# Patient Record
Sex: Male | Born: 1935 | Race: White | Hispanic: No | Marital: Married | Smoking: Former smoker
Health system: Southern US, Community
[De-identification: ages and names within clinical notes are randomized; demographics above are authoritative.]

## PROBLEM LIST (undated history)

## (undated) DIAGNOSIS — I2510 Atherosclerotic heart disease of native coronary artery without angina pectoris: Secondary | ICD-10-CM

## (undated) DIAGNOSIS — I219 Acute myocardial infarction, unspecified: Secondary | ICD-10-CM

## (undated) DIAGNOSIS — M1990 Unspecified osteoarthritis, unspecified site: Secondary | ICD-10-CM

## (undated) DIAGNOSIS — J986 Disorders of diaphragm: Secondary | ICD-10-CM

## (undated) DIAGNOSIS — I495 Sick sinus syndrome: Secondary | ICD-10-CM

## (undated) DIAGNOSIS — M4800 Spinal stenosis, site unspecified: Secondary | ICD-10-CM

## (undated) DIAGNOSIS — I519 Heart disease, unspecified: Secondary | ICD-10-CM

## (undated) DIAGNOSIS — M722 Plantar fascial fibromatosis: Secondary | ICD-10-CM

## (undated) DIAGNOSIS — J329 Chronic sinusitis, unspecified: Secondary | ICD-10-CM

## (undated) DIAGNOSIS — J479 Bronchiectasis, uncomplicated: Secondary | ICD-10-CM

## (undated) DIAGNOSIS — K219 Gastro-esophageal reflux disease without esophagitis: Secondary | ICD-10-CM

## (undated) DIAGNOSIS — Z8719 Personal history of other diseases of the digestive system: Secondary | ICD-10-CM

## (undated) DIAGNOSIS — R040 Epistaxis: Secondary | ICD-10-CM

## (undated) DIAGNOSIS — I10 Essential (primary) hypertension: Secondary | ICD-10-CM

## (undated) DIAGNOSIS — M549 Dorsalgia, unspecified: Secondary | ICD-10-CM

## (undated) DIAGNOSIS — G4700 Insomnia, unspecified: Secondary | ICD-10-CM

## (undated) DIAGNOSIS — J309 Allergic rhinitis, unspecified: Secondary | ICD-10-CM

## (undated) DIAGNOSIS — K5790 Diverticulosis of intestine, part unspecified, without perforation or abscess without bleeding: Secondary | ICD-10-CM

## (undated) DIAGNOSIS — I4891 Unspecified atrial fibrillation: Secondary | ICD-10-CM

## (undated) DIAGNOSIS — M810 Age-related osteoporosis without current pathological fracture: Secondary | ICD-10-CM

## (undated) DIAGNOSIS — E785 Hyperlipidemia, unspecified: Secondary | ICD-10-CM

## (undated) DIAGNOSIS — F0390 Unspecified dementia without behavioral disturbance: Secondary | ICD-10-CM

## (undated) HISTORY — DX: Personal history of other diseases of the digestive system: Z87.19

## (undated) HISTORY — DX: Spinal stenosis, site unspecified: M48.00

## (undated) HISTORY — PX: CATARACT EXTRACTION W/ INTRAOCULAR LENS  IMPLANT, BILATERAL: SHX1307

## (undated) HISTORY — DX: Allergic rhinitis, unspecified: J30.9

## (undated) HISTORY — DX: Epistaxis: R04.0

## (undated) HISTORY — DX: Hyperlipidemia, unspecified: E78.5

## (undated) HISTORY — DX: Diverticulosis of intestine, part unspecified, without perforation or abscess without bleeding: K57.90

## (undated) HISTORY — PX: PACEMAKER INSERTION: SHX728

## (undated) HISTORY — DX: Heart disease, unspecified: I51.9

## (undated) HISTORY — DX: Plantar fascial fibromatosis: M72.2

## (undated) HISTORY — PX: KIDNEY CYST REMOVAL: SHX684

## (undated) HISTORY — PX: VERTEBROPLASTY: SHX113

## (undated) HISTORY — DX: Disorders of diaphragm: J98.6

## (undated) HISTORY — DX: Bronchiectasis, uncomplicated: J47.9

## (undated) HISTORY — DX: Chronic sinusitis, unspecified: J32.9

## (undated) HISTORY — PX: COLONOSCOPY: SHX174

## (undated) HISTORY — PX: CORONARY ARTERY BYPASS GRAFT: SHX141

## (undated) HISTORY — PX: CHOLECYSTECTOMY: SHX55

---

## 1998-03-22 ENCOUNTER — Encounter (HOSPITAL_COMMUNITY): Admission: RE | Admit: 1998-03-22 | Discharge: 1998-06-20 | Payer: Self-pay | Admitting: Cardiology

## 1998-06-20 ENCOUNTER — Encounter (HOSPITAL_COMMUNITY): Admission: RE | Admit: 1998-06-20 | Discharge: 1998-09-18 | Payer: Self-pay | Admitting: Cardiology

## 1998-09-19 ENCOUNTER — Encounter (HOSPITAL_COMMUNITY): Admission: RE | Admit: 1998-09-19 | Discharge: 1998-12-18 | Payer: Self-pay | Admitting: Cardiology

## 1998-12-19 ENCOUNTER — Encounter (HOSPITAL_COMMUNITY): Admission: RE | Admit: 1998-12-19 | Discharge: 1999-03-19 | Payer: Self-pay | Admitting: Cardiology

## 1999-03-20 ENCOUNTER — Encounter (HOSPITAL_COMMUNITY): Admission: RE | Admit: 1999-03-20 | Discharge: 1999-06-18 | Payer: Self-pay | Admitting: Cardiology

## 1999-06-19 ENCOUNTER — Encounter (HOSPITAL_COMMUNITY): Admission: RE | Admit: 1999-06-19 | Discharge: 1999-07-20 | Payer: Self-pay | Admitting: Cardiology

## 1999-07-01 ENCOUNTER — Ambulatory Visit (HOSPITAL_BASED_OUTPATIENT_CLINIC_OR_DEPARTMENT_OTHER): Admission: RE | Admit: 1999-07-01 | Discharge: 1999-07-01 | Payer: Self-pay | Admitting: Urology

## 1999-07-21 ENCOUNTER — Encounter: Admission: RE | Admit: 1999-07-21 | Discharge: 1999-10-19 | Payer: Self-pay | Admitting: Cardiology

## 2002-11-30 ENCOUNTER — Encounter: Payer: Self-pay | Admitting: Cardiology

## 2002-11-30 ENCOUNTER — Inpatient Hospital Stay (HOSPITAL_COMMUNITY): Admission: EM | Admit: 2002-11-30 | Discharge: 2002-12-03 | Payer: Self-pay | Admitting: Emergency Medicine

## 2002-12-24 ENCOUNTER — Encounter (HOSPITAL_COMMUNITY): Admission: RE | Admit: 2002-12-24 | Discharge: 2003-03-24 | Payer: Self-pay | Admitting: Cardiology

## 2003-03-26 ENCOUNTER — Encounter (HOSPITAL_COMMUNITY): Admission: RE | Admit: 2003-03-26 | Discharge: 2003-06-24 | Payer: Self-pay | Admitting: Cardiology

## 2003-07-21 ENCOUNTER — Encounter (HOSPITAL_COMMUNITY): Admission: RE | Admit: 2003-07-21 | Discharge: 2003-10-19 | Payer: Self-pay | Admitting: Cardiology

## 2003-10-21 ENCOUNTER — Encounter (HOSPITAL_COMMUNITY): Admission: RE | Admit: 2003-10-21 | Discharge: 2004-01-19 | Payer: Self-pay | Admitting: Cardiology

## 2004-01-21 ENCOUNTER — Encounter (HOSPITAL_COMMUNITY): Admission: RE | Admit: 2004-01-21 | Discharge: 2004-04-20 | Payer: Self-pay | Admitting: Cardiology

## 2004-04-21 ENCOUNTER — Encounter (HOSPITAL_COMMUNITY): Admission: RE | Admit: 2004-04-21 | Discharge: 2004-07-20 | Payer: Self-pay | Admitting: Cardiology

## 2004-07-21 ENCOUNTER — Encounter (HOSPITAL_COMMUNITY): Admission: RE | Admit: 2004-07-21 | Discharge: 2004-10-19 | Payer: Self-pay | Admitting: Cardiology

## 2004-08-19 ENCOUNTER — Ambulatory Visit (HOSPITAL_COMMUNITY): Admission: RE | Admit: 2004-08-19 | Discharge: 2004-08-19 | Payer: Self-pay | Admitting: Ophthalmology

## 2004-08-19 ENCOUNTER — Ambulatory Visit (HOSPITAL_BASED_OUTPATIENT_CLINIC_OR_DEPARTMENT_OTHER): Admission: RE | Admit: 2004-08-19 | Discharge: 2004-08-19 | Payer: Self-pay | Admitting: Ophthalmology

## 2004-08-19 ENCOUNTER — Encounter (INDEPENDENT_AMBULATORY_CARE_PROVIDER_SITE_OTHER): Payer: Self-pay | Admitting: *Deleted

## 2004-10-20 ENCOUNTER — Encounter (HOSPITAL_COMMUNITY): Admission: RE | Admit: 2004-10-20 | Discharge: 2005-01-18 | Payer: Self-pay | Admitting: Cardiology

## 2005-01-20 ENCOUNTER — Encounter (HOSPITAL_COMMUNITY): Admission: RE | Admit: 2005-01-20 | Discharge: 2005-04-20 | Payer: Self-pay | Admitting: Cardiology

## 2005-01-29 ENCOUNTER — Ambulatory Visit: Payer: Self-pay | Admitting: Cardiology

## 2005-04-21 ENCOUNTER — Encounter (HOSPITAL_COMMUNITY): Admission: RE | Admit: 2005-04-21 | Discharge: 2005-07-20 | Payer: Self-pay | Admitting: Cardiology

## 2005-06-08 ENCOUNTER — Encounter: Admission: RE | Admit: 2005-06-08 | Discharge: 2005-06-08 | Payer: Self-pay | Admitting: Endocrinology

## 2005-07-21 ENCOUNTER — Encounter (HOSPITAL_COMMUNITY): Admission: RE | Admit: 2005-07-21 | Discharge: 2005-10-19 | Payer: Self-pay | Admitting: Cardiology

## 2005-08-06 ENCOUNTER — Ambulatory Visit: Payer: Self-pay

## 2005-08-26 ENCOUNTER — Ambulatory Visit: Payer: Self-pay | Admitting: Cardiology

## 2005-08-30 ENCOUNTER — Ambulatory Visit: Payer: Self-pay | Admitting: Internal Medicine

## 2005-08-30 ENCOUNTER — Ambulatory Visit: Payer: Self-pay

## 2005-09-01 ENCOUNTER — Ambulatory Visit: Payer: Self-pay | Admitting: Cardiology

## 2005-09-06 ENCOUNTER — Ambulatory Visit: Payer: Self-pay | Admitting: Cardiology

## 2005-09-13 ENCOUNTER — Ambulatory Visit: Payer: Self-pay | Admitting: Internal Medicine

## 2005-09-20 ENCOUNTER — Ambulatory Visit: Payer: Self-pay | Admitting: Cardiology

## 2005-09-27 ENCOUNTER — Ambulatory Visit: Payer: Self-pay | Admitting: Cardiology

## 2005-09-28 ENCOUNTER — Ambulatory Visit: Payer: Self-pay | Admitting: Cardiology

## 2005-09-28 ENCOUNTER — Emergency Department (HOSPITAL_COMMUNITY): Admission: EM | Admit: 2005-09-28 | Discharge: 2005-09-28 | Payer: Self-pay | Admitting: *Deleted

## 2005-10-01 ENCOUNTER — Ambulatory Visit: Payer: Self-pay | Admitting: Cardiology

## 2005-10-13 ENCOUNTER — Ambulatory Visit: Payer: Self-pay | Admitting: Cardiology

## 2005-10-20 ENCOUNTER — Encounter (HOSPITAL_COMMUNITY): Admission: RE | Admit: 2005-10-20 | Discharge: 2006-01-18 | Payer: Self-pay | Admitting: Cardiology

## 2005-10-21 ENCOUNTER — Ambulatory Visit: Payer: Self-pay | Admitting: Cardiology

## 2005-10-28 ENCOUNTER — Inpatient Hospital Stay (HOSPITAL_COMMUNITY): Admission: EM | Admit: 2005-10-28 | Discharge: 2005-10-30 | Payer: Self-pay | Admitting: Emergency Medicine

## 2005-11-03 ENCOUNTER — Ambulatory Visit: Payer: Self-pay | Admitting: Cardiology

## 2005-11-08 ENCOUNTER — Ambulatory Visit: Payer: Self-pay | Admitting: Cardiology

## 2005-11-17 ENCOUNTER — Ambulatory Visit: Payer: Self-pay | Admitting: Cardiology

## 2005-11-26 ENCOUNTER — Ambulatory Visit: Payer: Self-pay | Admitting: Cardiology

## 2005-12-03 ENCOUNTER — Ambulatory Visit: Payer: Self-pay | Admitting: Cardiology

## 2005-12-03 ENCOUNTER — Ambulatory Visit: Payer: Self-pay | Admitting: *Deleted

## 2005-12-17 ENCOUNTER — Ambulatory Visit: Payer: Self-pay | Admitting: Cardiology

## 2005-12-29 ENCOUNTER — Ambulatory Visit: Payer: Self-pay | Admitting: *Deleted

## 2006-01-07 ENCOUNTER — Ambulatory Visit: Payer: Self-pay | Admitting: Cardiology

## 2006-01-20 ENCOUNTER — Encounter (HOSPITAL_COMMUNITY): Admission: RE | Admit: 2006-01-20 | Discharge: 2006-04-20 | Payer: Self-pay | Admitting: Cardiology

## 2006-01-25 ENCOUNTER — Ambulatory Visit: Payer: Self-pay | Admitting: Cardiology

## 2006-01-31 ENCOUNTER — Ambulatory Visit: Payer: Self-pay | Admitting: Internal Medicine

## 2006-02-28 ENCOUNTER — Ambulatory Visit: Payer: Self-pay | Admitting: Cardiology

## 2006-03-24 ENCOUNTER — Ambulatory Visit: Payer: Self-pay | Admitting: Internal Medicine

## 2006-03-28 ENCOUNTER — Ambulatory Visit: Payer: Self-pay | Admitting: Cardiology

## 2006-04-04 ENCOUNTER — Ambulatory Visit: Payer: Self-pay | Admitting: Internal Medicine

## 2006-04-12 ENCOUNTER — Ambulatory Visit: Payer: Self-pay | Admitting: Cardiovascular Disease

## 2006-04-21 ENCOUNTER — Encounter (HOSPITAL_COMMUNITY): Admission: RE | Admit: 2006-04-21 | Discharge: 2006-07-20 | Payer: Self-pay | Admitting: Cardiology

## 2006-04-26 ENCOUNTER — Ambulatory Visit: Payer: Self-pay | Admitting: *Deleted

## 2006-04-29 ENCOUNTER — Ambulatory Visit: Payer: Self-pay | Admitting: Internal Medicine

## 2006-05-06 ENCOUNTER — Ambulatory Visit: Payer: Self-pay | Admitting: Internal Medicine

## 2006-05-11 ENCOUNTER — Ambulatory Visit: Payer: Self-pay | Admitting: Cardiology

## 2006-05-18 ENCOUNTER — Ambulatory Visit: Payer: Self-pay | Admitting: Internal Medicine

## 2006-05-18 ENCOUNTER — Ambulatory Visit: Payer: Self-pay | Admitting: Cardiology

## 2006-05-25 ENCOUNTER — Ambulatory Visit: Payer: Self-pay | Admitting: Emergency Medicine

## 2006-06-01 ENCOUNTER — Ambulatory Visit: Payer: Self-pay | Admitting: Cardiology

## 2006-06-21 ENCOUNTER — Ambulatory Visit: Payer: Self-pay | Admitting: Cardiology

## 2006-07-05 ENCOUNTER — Ambulatory Visit: Payer: Self-pay | Admitting: Cardiology

## 2006-07-21 ENCOUNTER — Encounter (HOSPITAL_COMMUNITY): Admission: RE | Admit: 2006-07-21 | Discharge: 2006-10-19 | Payer: Self-pay | Admitting: Cardiology

## 2006-07-25 ENCOUNTER — Ambulatory Visit: Payer: Self-pay | Admitting: Internal Medicine

## 2006-07-25 ENCOUNTER — Ambulatory Visit: Payer: Self-pay | Admitting: Cardiology

## 2006-08-01 ENCOUNTER — Ambulatory Visit: Payer: Self-pay | Admitting: Cardiovascular Disease

## 2006-08-03 ENCOUNTER — Ambulatory Visit: Payer: Self-pay | Admitting: Internal Medicine

## 2006-08-10 ENCOUNTER — Ambulatory Visit: Payer: Self-pay | Admitting: Cardiology

## 2006-08-10 ENCOUNTER — Ambulatory Visit: Payer: Self-pay | Admitting: Emergency Medicine

## 2006-08-11 ENCOUNTER — Ambulatory Visit: Payer: Self-pay | Admitting: Cardiology

## 2006-08-17 ENCOUNTER — Ambulatory Visit: Payer: Self-pay

## 2006-08-19 ENCOUNTER — Ambulatory Visit: Payer: Self-pay

## 2006-08-19 ENCOUNTER — Ambulatory Visit: Payer: Self-pay | Admitting: Cardiology

## 2006-08-24 ENCOUNTER — Ambulatory Visit (HOSPITAL_COMMUNITY): Admission: RE | Admit: 2006-08-24 | Discharge: 2006-08-25 | Payer: Self-pay | Admitting: Internal Medicine

## 2006-08-24 ENCOUNTER — Ambulatory Visit: Payer: Self-pay | Admitting: Internal Medicine

## 2006-08-26 ENCOUNTER — Ambulatory Visit: Payer: Self-pay | Admitting: Cardiology

## 2006-08-26 ENCOUNTER — Inpatient Hospital Stay (HOSPITAL_COMMUNITY): Admission: EM | Admit: 2006-08-26 | Discharge: 2006-08-28 | Payer: Self-pay | Admitting: Emergency Medicine

## 2006-08-30 ENCOUNTER — Ambulatory Visit: Payer: Self-pay | Admitting: Cardiovascular Disease

## 2006-09-01 ENCOUNTER — Ambulatory Visit: Payer: Self-pay | Admitting: Internal Medicine

## 2006-09-08 ENCOUNTER — Ambulatory Visit: Payer: Self-pay | Admitting: Cardiology

## 2006-09-22 ENCOUNTER — Ambulatory Visit: Payer: Self-pay | Admitting: Cardiology

## 2006-09-26 ENCOUNTER — Ambulatory Visit: Payer: Self-pay | Admitting: Cardiovascular Disease

## 2006-10-03 ENCOUNTER — Ambulatory Visit: Payer: Self-pay | Admitting: Cardiology

## 2006-10-10 ENCOUNTER — Ambulatory Visit: Payer: Self-pay | Admitting: Emergency Medicine

## 2006-10-10 ENCOUNTER — Ambulatory Visit: Payer: Self-pay | Admitting: Cardiovascular Disease

## 2006-10-20 ENCOUNTER — Encounter (HOSPITAL_COMMUNITY): Admission: RE | Admit: 2006-10-20 | Discharge: 2007-01-18 | Payer: Self-pay | Admitting: Cardiology

## 2006-10-21 ENCOUNTER — Ambulatory Visit: Payer: Self-pay | Admitting: Cardiology

## 2006-10-25 ENCOUNTER — Ambulatory Visit: Payer: Self-pay | Admitting: *Deleted

## 2006-11-17 ENCOUNTER — Ambulatory Visit: Payer: Self-pay | Admitting: Internal Medicine

## 2006-12-01 ENCOUNTER — Ambulatory Visit: Payer: Self-pay | Admitting: Cardiology

## 2006-12-06 ENCOUNTER — Ambulatory Visit: Payer: Self-pay | Admitting: Internal Medicine

## 2006-12-06 ENCOUNTER — Ambulatory Visit: Payer: Self-pay | Admitting: Cardiology

## 2006-12-27 ENCOUNTER — Ambulatory Visit: Payer: Self-pay | Admitting: Cardiology

## 2007-01-04 ENCOUNTER — Ambulatory Visit: Payer: Self-pay | Admitting: Cardiology

## 2007-01-16 ENCOUNTER — Ambulatory Visit: Payer: Self-pay | Admitting: Internal Medicine

## 2007-01-20 ENCOUNTER — Encounter (HOSPITAL_COMMUNITY): Admission: RE | Admit: 2007-01-20 | Discharge: 2007-04-20 | Payer: Self-pay | Admitting: Cardiology

## 2007-01-20 ENCOUNTER — Ambulatory Visit: Payer: Self-pay | Admitting: Internal Medicine

## 2007-01-25 ENCOUNTER — Ambulatory Visit: Payer: Self-pay | Admitting: Cardiology

## 2007-02-03 ENCOUNTER — Ambulatory Visit: Payer: Self-pay | Admitting: Cardiology

## 2007-02-15 ENCOUNTER — Encounter: Admission: RE | Admit: 2007-02-15 | Discharge: 2007-02-15 | Payer: Self-pay | Admitting: Orthopaedic Surgery

## 2007-03-03 ENCOUNTER — Ambulatory Visit: Payer: Self-pay | Admitting: Cardiovascular Disease

## 2007-03-13 ENCOUNTER — Ambulatory Visit: Payer: Self-pay | Admitting: Cardiology

## 2007-03-27 ENCOUNTER — Ambulatory Visit: Payer: Self-pay | Admitting: Cardiovascular Disease

## 2007-04-03 ENCOUNTER — Ambulatory Visit: Payer: Self-pay | Admitting: Cardiology

## 2007-04-10 ENCOUNTER — Ambulatory Visit: Payer: Self-pay | Admitting: Cardiology

## 2007-04-21 ENCOUNTER — Encounter (HOSPITAL_COMMUNITY): Admission: RE | Admit: 2007-04-21 | Discharge: 2007-07-20 | Payer: Self-pay | Admitting: Cardiology

## 2007-04-27 ENCOUNTER — Encounter: Admission: RE | Admit: 2007-04-27 | Discharge: 2007-04-27 | Payer: Self-pay | Admitting: Orthopaedic Surgery

## 2007-04-27 ENCOUNTER — Ambulatory Visit: Payer: Self-pay | Admitting: Cardiology

## 2007-05-05 ENCOUNTER — Ambulatory Visit: Payer: Self-pay | Admitting: Cardiology

## 2007-05-17 ENCOUNTER — Ambulatory Visit: Payer: Self-pay | Admitting: Cardiology

## 2007-05-17 ENCOUNTER — Encounter: Admission: RE | Admit: 2007-05-17 | Discharge: 2007-05-17 | Payer: Self-pay | Admitting: Orthopaedic Surgery

## 2007-05-24 ENCOUNTER — Ambulatory Visit: Payer: Self-pay | Admitting: Cardiology

## 2007-06-12 ENCOUNTER — Ambulatory Visit: Payer: Self-pay | Admitting: Cardiology

## 2007-06-13 ENCOUNTER — Encounter: Admission: RE | Admit: 2007-06-13 | Discharge: 2007-06-13 | Payer: Self-pay | Admitting: Orthopaedic Surgery

## 2007-06-19 ENCOUNTER — Ambulatory Visit: Payer: Self-pay | Admitting: Cardiology

## 2007-06-28 ENCOUNTER — Ambulatory Visit: Payer: Self-pay | Admitting: Cardiology

## 2007-07-07 ENCOUNTER — Ambulatory Visit: Payer: Self-pay | Admitting: Internal Medicine

## 2007-07-13 ENCOUNTER — Ambulatory Visit: Payer: Self-pay | Admitting: Cardiology

## 2007-07-21 ENCOUNTER — Encounter (HOSPITAL_COMMUNITY): Admission: RE | Admit: 2007-07-21 | Discharge: 2007-10-19 | Payer: Self-pay | Admitting: Cardiology

## 2007-08-01 ENCOUNTER — Ambulatory Visit: Payer: Self-pay | Admitting: Cardiology

## 2007-08-17 ENCOUNTER — Ambulatory Visit: Payer: Self-pay | Admitting: Cardiology

## 2007-08-22 ENCOUNTER — Ambulatory Visit: Payer: Self-pay | Admitting: Internal Medicine

## 2007-09-06 ENCOUNTER — Ambulatory Visit: Payer: Self-pay | Admitting: Internal Medicine

## 2007-10-04 ENCOUNTER — Ambulatory Visit: Payer: Self-pay | Admitting: Cardiovascular Disease

## 2007-10-16 ENCOUNTER — Ambulatory Visit: Payer: Self-pay | Admitting: Cardiology

## 2007-10-21 ENCOUNTER — Encounter (HOSPITAL_COMMUNITY): Admission: RE | Admit: 2007-10-21 | Discharge: 2007-12-19 | Payer: Self-pay | Admitting: Cardiology

## 2007-10-26 DIAGNOSIS — J329 Chronic sinusitis, unspecified: Secondary | ICD-10-CM | POA: Insufficient documentation

## 2007-10-26 DIAGNOSIS — Z951 Presence of aortocoronary bypass graft: Secondary | ICD-10-CM

## 2007-10-26 DIAGNOSIS — M722 Plantar fascial fibromatosis: Secondary | ICD-10-CM

## 2007-10-26 DIAGNOSIS — I4891 Unspecified atrial fibrillation: Secondary | ICD-10-CM

## 2007-10-26 DIAGNOSIS — M545 Low back pain: Secondary | ICD-10-CM

## 2007-10-26 DIAGNOSIS — R040 Epistaxis: Secondary | ICD-10-CM | POA: Insufficient documentation

## 2007-10-26 DIAGNOSIS — J309 Allergic rhinitis, unspecified: Secondary | ICD-10-CM | POA: Insufficient documentation

## 2007-10-26 DIAGNOSIS — E785 Hyperlipidemia, unspecified: Secondary | ICD-10-CM

## 2007-10-26 DIAGNOSIS — J479 Bronchiectasis, uncomplicated: Secondary | ICD-10-CM

## 2007-11-01 ENCOUNTER — Ambulatory Visit: Payer: Self-pay | Admitting: Cardiology

## 2007-11-29 ENCOUNTER — Ambulatory Visit: Payer: Self-pay | Admitting: Cardiovascular Disease

## 2007-12-04 ENCOUNTER — Ambulatory Visit: Payer: Self-pay | Admitting: Internal Medicine

## 2007-12-21 ENCOUNTER — Encounter (HOSPITAL_COMMUNITY): Admission: RE | Admit: 2007-12-21 | Discharge: 2008-03-14 | Payer: Self-pay | Admitting: Cardiology

## 2007-12-27 ENCOUNTER — Ambulatory Visit: Payer: Self-pay | Admitting: Cardiovascular Disease

## 2008-01-17 ENCOUNTER — Ambulatory Visit: Payer: Self-pay | Admitting: Cardiology

## 2008-01-24 ENCOUNTER — Ambulatory Visit: Payer: Self-pay | Admitting: Internal Medicine

## 2008-02-21 ENCOUNTER — Ambulatory Visit: Payer: Self-pay | Admitting: Cardiology

## 2008-02-21 ENCOUNTER — Ambulatory Visit: Payer: Self-pay | Admitting: Internal Medicine

## 2008-03-20 ENCOUNTER — Ambulatory Visit: Payer: Self-pay | Admitting: Internal Medicine

## 2008-03-20 ENCOUNTER — Encounter (HOSPITAL_COMMUNITY): Admission: RE | Admit: 2008-03-20 | Discharge: 2008-06-18 | Payer: Self-pay | Admitting: Cardiology

## 2008-04-17 ENCOUNTER — Ambulatory Visit: Payer: Self-pay | Admitting: Cardiology

## 2008-05-20 ENCOUNTER — Ambulatory Visit: Payer: Self-pay | Admitting: Cardiology

## 2008-05-22 ENCOUNTER — Ambulatory Visit: Payer: Self-pay | Admitting: Internal Medicine

## 2008-05-28 ENCOUNTER — Ambulatory Visit: Payer: Self-pay

## 2008-06-13 ENCOUNTER — Ambulatory Visit: Payer: Self-pay | Admitting: Cardiology

## 2008-06-17 ENCOUNTER — Ambulatory Visit: Payer: Self-pay | Admitting: Cardiology

## 2008-06-20 ENCOUNTER — Encounter (HOSPITAL_COMMUNITY): Admission: RE | Admit: 2008-06-20 | Discharge: 2008-09-18 | Payer: Self-pay | Admitting: Cardiology

## 2008-07-15 ENCOUNTER — Ambulatory Visit: Payer: Self-pay | Admitting: Cardiovascular Disease

## 2008-08-12 ENCOUNTER — Ambulatory Visit: Payer: Self-pay | Admitting: Internal Medicine

## 2008-08-14 ENCOUNTER — Ambulatory Visit: Payer: Self-pay | Admitting: Cardiology

## 2008-09-10 ENCOUNTER — Ambulatory Visit: Payer: Self-pay | Admitting: Internal Medicine

## 2008-09-10 ENCOUNTER — Ambulatory Visit: Payer: Self-pay | Admitting: Cardiology

## 2008-09-19 ENCOUNTER — Encounter (HOSPITAL_COMMUNITY): Admission: RE | Admit: 2008-09-19 | Discharge: 2008-12-18 | Payer: Self-pay | Admitting: Cardiology

## 2008-10-08 ENCOUNTER — Ambulatory Visit: Payer: Self-pay | Admitting: Internal Medicine

## 2008-11-05 ENCOUNTER — Ambulatory Visit: Payer: Self-pay | Admitting: Cardiovascular Disease

## 2008-12-05 ENCOUNTER — Ambulatory Visit: Payer: Self-pay | Admitting: Cardiology

## 2008-12-17 ENCOUNTER — Ambulatory Visit: Payer: Self-pay | Admitting: Internal Medicine

## 2008-12-20 ENCOUNTER — Encounter (HOSPITAL_COMMUNITY): Admission: RE | Admit: 2008-12-20 | Discharge: 2009-03-20 | Payer: Self-pay | Admitting: Cardiology

## 2008-12-31 ENCOUNTER — Ambulatory Visit: Payer: Self-pay | Admitting: Cardiovascular Disease

## 2009-01-30 ENCOUNTER — Ambulatory Visit: Payer: Self-pay | Admitting: Cardiovascular Disease

## 2009-02-27 ENCOUNTER — Ambulatory Visit: Payer: Self-pay | Admitting: Cardiovascular Disease

## 2009-03-13 ENCOUNTER — Ambulatory Visit: Payer: Self-pay | Admitting: Cardiology

## 2009-03-17 ENCOUNTER — Encounter: Payer: Self-pay | Admitting: Cardiology

## 2009-03-19 ENCOUNTER — Ambulatory Visit: Payer: Self-pay | Admitting: Internal Medicine

## 2009-03-19 ENCOUNTER — Ambulatory Visit: Payer: Self-pay | Admitting: Cardiology

## 2009-03-21 ENCOUNTER — Encounter (HOSPITAL_COMMUNITY): Admission: RE | Admit: 2009-03-21 | Discharge: 2009-06-19 | Payer: Self-pay | Admitting: Cardiology

## 2009-03-30 ENCOUNTER — Encounter: Payer: Self-pay | Admitting: Cardiology

## 2009-03-31 ENCOUNTER — Encounter (INDEPENDENT_AMBULATORY_CARE_PROVIDER_SITE_OTHER): Payer: Self-pay

## 2009-04-01 ENCOUNTER — Ambulatory Visit: Payer: Self-pay | Admitting: Cardiology

## 2009-04-16 ENCOUNTER — Telehealth: Payer: Self-pay | Admitting: Cardiology

## 2009-04-29 ENCOUNTER — Ambulatory Visit: Payer: Self-pay | Admitting: Cardiology

## 2009-05-20 ENCOUNTER — Encounter: Payer: Self-pay | Admitting: *Deleted

## 2009-06-02 ENCOUNTER — Telehealth: Payer: Self-pay | Admitting: Cardiology

## 2009-06-03 ENCOUNTER — Encounter (INDEPENDENT_AMBULATORY_CARE_PROVIDER_SITE_OTHER): Payer: Self-pay | Admitting: Cardiology

## 2009-06-03 ENCOUNTER — Ambulatory Visit: Payer: Self-pay | Admitting: Internal Medicine

## 2009-06-03 LAB — CONVERTED CEMR LAB: Protime: 16.5

## 2009-06-08 ENCOUNTER — Encounter: Payer: Self-pay | Admitting: Cardiology

## 2009-06-09 ENCOUNTER — Ambulatory Visit: Payer: Self-pay | Admitting: Cardiology

## 2009-06-18 ENCOUNTER — Ambulatory Visit: Payer: Self-pay | Admitting: Internal Medicine

## 2009-06-19 ENCOUNTER — Ambulatory Visit: Payer: Self-pay | Admitting: Cardiology

## 2009-06-19 LAB — CONVERTED CEMR LAB: Prothrombin Time: 19.2 s

## 2009-06-20 ENCOUNTER — Encounter (HOSPITAL_COMMUNITY): Admission: RE | Admit: 2009-06-20 | Discharge: 2009-09-18 | Payer: Self-pay | Admitting: Cardiology

## 2009-06-23 ENCOUNTER — Encounter: Payer: Self-pay | Admitting: Internal Medicine

## 2009-06-25 ENCOUNTER — Encounter: Payer: Self-pay | Admitting: *Deleted

## 2009-07-08 ENCOUNTER — Ambulatory Visit: Payer: Self-pay | Admitting: Cardiology

## 2009-08-05 ENCOUNTER — Ambulatory Visit: Payer: Self-pay | Admitting: Cardiology

## 2009-08-05 LAB — CONVERTED CEMR LAB: POC INR: 2.5

## 2009-09-02 ENCOUNTER — Ambulatory Visit: Payer: Self-pay | Admitting: Cardiology

## 2009-09-02 ENCOUNTER — Ambulatory Visit: Payer: Self-pay | Admitting: Internal Medicine

## 2009-09-02 DIAGNOSIS — Z950 Presence of cardiac pacemaker: Secondary | ICD-10-CM

## 2009-09-19 ENCOUNTER — Encounter (HOSPITAL_COMMUNITY): Admission: RE | Admit: 2009-09-19 | Discharge: 2009-12-18 | Payer: Self-pay | Admitting: Cardiology

## 2009-09-24 LAB — HM COLONOSCOPY: HM Colonoscopy: NORMAL

## 2009-09-25 ENCOUNTER — Ambulatory Visit: Payer: Self-pay | Admitting: Internal Medicine

## 2009-10-07 ENCOUNTER — Ambulatory Visit: Payer: Self-pay | Admitting: Cardiology

## 2009-10-07 LAB — CONVERTED CEMR LAB: POC INR: 1.7

## 2009-10-21 ENCOUNTER — Ambulatory Visit: Payer: Self-pay | Admitting: Internal Medicine

## 2009-10-21 LAB — CONVERTED CEMR LAB: POC INR: 2.2

## 2009-10-27 ENCOUNTER — Telehealth: Payer: Self-pay | Admitting: Cardiology

## 2009-11-05 ENCOUNTER — Ambulatory Visit: Payer: Self-pay | Admitting: Cardiology

## 2009-11-20 ENCOUNTER — Ambulatory Visit: Payer: Self-pay | Admitting: Cardiovascular Disease

## 2009-11-24 ENCOUNTER — Telehealth: Payer: Self-pay | Admitting: Cardiology

## 2009-12-02 ENCOUNTER — Ambulatory Visit: Payer: Self-pay | Admitting: Cardiology

## 2009-12-03 ENCOUNTER — Ambulatory Visit: Payer: Self-pay | Admitting: Internal Medicine

## 2009-12-03 ENCOUNTER — Encounter: Payer: Self-pay | Admitting: Internal Medicine

## 2009-12-09 ENCOUNTER — Ambulatory Visit: Payer: Self-pay | Admitting: Internal Medicine

## 2009-12-15 ENCOUNTER — Encounter: Payer: Self-pay | Admitting: Internal Medicine

## 2009-12-20 ENCOUNTER — Encounter (HOSPITAL_COMMUNITY): Admission: RE | Admit: 2009-12-20 | Discharge: 2010-03-20 | Payer: Self-pay | Admitting: Cardiology

## 2009-12-20 HISTORY — PX: DOPPLER ECHOCARDIOGRAPHY: SHX263

## 2009-12-22 ENCOUNTER — Telehealth: Payer: Self-pay | Admitting: Cardiology

## 2010-01-05 ENCOUNTER — Ambulatory Visit: Payer: Self-pay | Admitting: Cardiology

## 2010-01-05 ENCOUNTER — Encounter (INDEPENDENT_AMBULATORY_CARE_PROVIDER_SITE_OTHER): Payer: Self-pay | Admitting: Cardiology

## 2010-01-05 ENCOUNTER — Telehealth: Payer: Self-pay | Admitting: Cardiology

## 2010-01-27 ENCOUNTER — Ambulatory Visit: Payer: Self-pay | Admitting: Cardiology

## 2010-01-27 LAB — CONVERTED CEMR LAB: POC INR: 2.3

## 2010-01-29 ENCOUNTER — Encounter: Payer: Self-pay | Admitting: Cardiology

## 2010-02-25 ENCOUNTER — Ambulatory Visit: Payer: Self-pay | Admitting: Cardiovascular Disease

## 2010-02-25 LAB — CONVERTED CEMR LAB: POC INR: 2.1

## 2010-03-04 ENCOUNTER — Ambulatory Visit: Payer: Self-pay | Admitting: Internal Medicine

## 2010-03-09 ENCOUNTER — Telehealth (INDEPENDENT_AMBULATORY_CARE_PROVIDER_SITE_OTHER): Payer: Self-pay | Admitting: *Deleted

## 2010-03-10 ENCOUNTER — Ambulatory Visit: Payer: Self-pay | Admitting: Cardiology

## 2010-03-17 ENCOUNTER — Encounter: Payer: Self-pay | Admitting: Internal Medicine

## 2010-03-21 ENCOUNTER — Encounter (HOSPITAL_COMMUNITY): Admission: RE | Admit: 2010-03-21 | Discharge: 2010-06-19 | Payer: Self-pay | Admitting: Cardiology

## 2010-03-24 ENCOUNTER — Ambulatory Visit: Payer: Self-pay | Admitting: Internal Medicine

## 2010-03-24 LAB — CONVERTED CEMR LAB: POC INR: 2.2

## 2010-04-07 ENCOUNTER — Encounter: Payer: Self-pay | Admitting: Cardiology

## 2010-04-08 ENCOUNTER — Encounter: Payer: Self-pay | Admitting: Cardiology

## 2010-04-13 ENCOUNTER — Encounter: Payer: Self-pay | Admitting: Cardiology

## 2010-04-21 ENCOUNTER — Ambulatory Visit: Payer: Self-pay | Admitting: Cardiology

## 2010-05-11 ENCOUNTER — Inpatient Hospital Stay (HOSPITAL_COMMUNITY): Admission: EM | Admit: 2010-05-11 | Discharge: 2010-05-23 | Payer: Self-pay | Admitting: Emergency Medicine

## 2010-05-11 ENCOUNTER — Encounter: Payer: Self-pay | Admitting: Cardiology

## 2010-05-11 ENCOUNTER — Ambulatory Visit: Payer: Self-pay | Admitting: Internal Medicine

## 2010-05-13 ENCOUNTER — Encounter (INDEPENDENT_AMBULATORY_CARE_PROVIDER_SITE_OTHER): Payer: Self-pay | Admitting: Internal Medicine

## 2010-05-14 ENCOUNTER — Encounter (INDEPENDENT_AMBULATORY_CARE_PROVIDER_SITE_OTHER): Payer: Self-pay | Admitting: Internal Medicine

## 2010-05-21 ENCOUNTER — Ambulatory Visit: Payer: Self-pay | Admitting: Infectious Disease

## 2010-05-27 ENCOUNTER — Encounter: Payer: Self-pay | Admitting: Cardiology

## 2010-05-27 LAB — CONVERTED CEMR LAB
POC INR: 2.7
Prothrombin Time: 28.5 s

## 2010-05-28 ENCOUNTER — Encounter: Payer: Self-pay | Admitting: Cardiology

## 2010-05-29 ENCOUNTER — Encounter: Payer: Self-pay | Admitting: Cardiology

## 2010-06-03 ENCOUNTER — Ambulatory Visit: Payer: Self-pay | Admitting: Internal Medicine

## 2010-06-03 ENCOUNTER — Encounter: Payer: Self-pay | Admitting: Cardiology

## 2010-06-04 ENCOUNTER — Encounter: Payer: Self-pay | Admitting: Cardiology

## 2010-06-15 ENCOUNTER — Ambulatory Visit: Payer: Self-pay | Admitting: Cardiology

## 2010-06-18 ENCOUNTER — Encounter: Payer: Self-pay | Admitting: Internal Medicine

## 2010-06-18 ENCOUNTER — Encounter: Payer: Self-pay | Admitting: Cardiology

## 2010-06-19 ENCOUNTER — Encounter: Payer: Self-pay | Admitting: Cardiology

## 2010-06-19 LAB — CONVERTED CEMR LAB: POC INR: 1.81

## 2010-06-20 ENCOUNTER — Encounter (HOSPITAL_COMMUNITY): Admission: RE | Admit: 2010-06-20 | Discharge: 2010-09-18 | Payer: Self-pay | Admitting: Cardiology

## 2010-07-03 ENCOUNTER — Ambulatory Visit: Payer: Self-pay | Admitting: Cardiovascular Disease

## 2010-07-17 ENCOUNTER — Ambulatory Visit: Payer: Self-pay | Admitting: Cardiology

## 2010-08-06 ENCOUNTER — Ambulatory Visit: Payer: Self-pay | Admitting: Cardiovascular Disease

## 2010-08-06 LAB — CONVERTED CEMR LAB: POC INR: 2.2

## 2010-08-17 ENCOUNTER — Encounter: Payer: Self-pay | Admitting: Cardiology

## 2010-08-19 ENCOUNTER — Ambulatory Visit: Payer: Self-pay | Admitting: Cardiology

## 2010-08-19 DIAGNOSIS — I25810 Atherosclerosis of coronary artery bypass graft(s) without angina pectoris: Secondary | ICD-10-CM

## 2010-09-03 ENCOUNTER — Ambulatory Visit: Payer: Self-pay | Admitting: Cardiology

## 2010-09-19 ENCOUNTER — Encounter (HOSPITAL_COMMUNITY)
Admission: RE | Admit: 2010-09-19 | Discharge: 2010-12-18 | Payer: Self-pay | Source: Home / Self Care | Attending: Cardiology | Admitting: Cardiology

## 2010-09-29 ENCOUNTER — Ambulatory Visit: Payer: Self-pay | Admitting: Internal Medicine

## 2010-10-01 ENCOUNTER — Ambulatory Visit: Payer: Self-pay | Admitting: Cardiology

## 2010-10-01 LAB — CONVERTED CEMR LAB: POC INR: 2.3

## 2010-10-21 ENCOUNTER — Telehealth: Payer: Self-pay | Admitting: Cardiology

## 2010-10-27 ENCOUNTER — Ambulatory Visit: Payer: Self-pay | Admitting: Internal Medicine

## 2010-11-05 ENCOUNTER — Telehealth: Payer: Self-pay | Admitting: Cardiology

## 2010-12-01 ENCOUNTER — Ambulatory Visit: Payer: Self-pay | Admitting: Cardiology

## 2010-12-01 ENCOUNTER — Ambulatory Visit: Payer: Self-pay | Admitting: Internal Medicine

## 2010-12-01 ENCOUNTER — Encounter: Payer: Self-pay | Admitting: Cardiology

## 2010-12-09 ENCOUNTER — Telehealth: Payer: Self-pay | Admitting: Cardiology

## 2010-12-22 ENCOUNTER — Encounter (HOSPITAL_COMMUNITY)
Admission: RE | Admit: 2010-12-22 | Discharge: 2011-01-19 | Payer: Self-pay | Source: Home / Self Care | Attending: Cardiology | Admitting: Cardiology

## 2010-12-31 ENCOUNTER — Encounter: Payer: Self-pay | Admitting: Internal Medicine

## 2010-12-31 ENCOUNTER — Ambulatory Visit
Admission: RE | Admit: 2010-12-31 | Discharge: 2010-12-31 | Payer: Self-pay | Source: Home / Self Care | Attending: Internal Medicine | Admitting: Internal Medicine

## 2011-01-04 ENCOUNTER — Ambulatory Visit: Admission: RE | Admit: 2011-01-04 | Discharge: 2011-01-04 | Payer: Self-pay | Source: Home / Self Care

## 2011-01-20 ENCOUNTER — Encounter (HOSPITAL_COMMUNITY): Payer: Medicare Other

## 2011-01-21 NOTE — Assessment & Plan Note (Signed)
Summary: f39m   Visit Type:  2 months follow up Primary Provider:  (Primary Care Dr Dagoberto Ligas ) ( Dentist DRChou) (ENT DR Thayer Ohm Newman)(Eyes DR Pearlean Brownie) (Urologist DR Vonita Moss) (Dermatologist Dr Romeo Apple Turner)(Rheumatologist DR Zacarias Pontes ) ((Orthopedic DR Theron Arista Whitfield)(Allegist DR [place]man)(Pulmonary Dr Levy Pupa)  CC:  Sob- 1 episode dizziness last night.  History of Present Illness: He thinks he is doing pretty well.  He forgot his beta blocker prescription.  He gets some shortness of breath, notlnecessarily related to activity.  It varies.  He is washing out sinus with saline.  He may have gotten it too warm and got dizzy with it.  He needs to have a colon exam.    Current Medications (verified): 1)  Adult Aspirin Low Strength 81 Mg  Tbdp (Aspirin) .... Take 1 Tablet By Mouth Once A Day 2)  Metoprolol Tartrate 25 Mg  Tabs (Metoprolol Tartrate) .... Take 1 Tablet By Mouth Two Times A Day 3)  Citracal Plus  Tabs (Multiple Minerals-Vitamins) .... Take 1 Tablet Three Times Daily 4)  Warfarin Sodium 1 Mg  Tabs (Warfarin Sodium) .... Use As Directed 5)  Crestor 10 Mg  Tabs (Rosuvastatin Calcium) .... Take 1 Tablet By Mouth Once A Day 6)  Fexofenadine Hcl 180 Mg  Tabs (Fexofenadine Hcl) .... Take 1 Tablet By Mouth Once A Day 7)  Finasteride 5 Mg  Tabs (Finasteride) .... Take 1 Tablet By Mouth Once A Day 8)  Fluticasone Propionate 50 Mcg/act  Susp (Fluticasone Propionate) .... Take 2 Sprays in Each Nostril Once A Day 9)  Terazosin Hcl 5 Mg  Caps (Terazosin Hcl) .... Take 1 Capsule By Mouth Once A Day 10)  Omeprazole 40 Mg Cpdr (Omeprazole) .... Take 1 Capsule Every Day 11)  Lasix 20 Mg  Tabs (Furosemide) .... Take 1 Tablet By Mouth Once A Day 12)  Lyrica 75 Mg Caps (Pregabalin) .... Take One Capsule Daily 13)  Alendronate Sodium 70 Mg Tabs (Alendronate Sodium) .... Take 1 Tablet Evey Week 14)  Fish Oil 1000 Mg Caps (Omega-3 Fatty Acids) .... Take One Capsule Daily 15)  Coq-10 100 Mg Caps  (Coenzyme Q10) .... Take One Capsule Daily  Allergies: 1)  ! Penicillin 2)  ! Sulfa 3)  ! Flagyl 4)  ! Doxycycline Hyclate (Doxycycline Hyclate) 5)  ! Erythromycin 6)  ! Tetracycline 7)  ! * Clindamycin  Vital Signs:  Patient profile:   77 year old male Height:      70 inches Weight:      202.50 pounds BMI:     29.16 Pulse rate:   88 / minute Pulse rhythm:   regular Resp:     18 per minute BP sitting:   140 / 80  (right arm) Cuff size:   large  Vitals Entered By: Vikki Ports (March 10, 2010 9:55 AM)  Physical Exam  General:  Well developed, well nourished, in no acute distress. Head:  normocephalic and atraumatic Eyes:  PERRLA/EOM intact; conjunctiva and lids normal. Neck:  No obvious carotid bruits. Chest Wall:  no deformities or breast masses noted Lungs:  Clear bilaterally to auscultation and percussion.  Minimal Ronchii Heart:  Regular today.  Normal S1 wide split s2.  No def murmur. Abdomen:  Bowel sounds positive; abdomen soft and non-tender without masses, organomegaly, or hernias noted. No hepatosplenomegaly. Extremities:  No clubbing or cyanosis. Neurologic:  Alert and oriented x 3.   PPM Specifications Following MD:  [name], MD     PPM Vendor:  [employer]     PPM Model Number:  [id number]     PPM Serial Number:  [id number] PPM DOI:  08/24/2006     PPM Implanting MD:  [name], MD  Lead 1    Location: [facility]     DOI: 08/24/2006     Model #: [id number]     Serial #: [id number]     Status: active Lead 2    Location: RV     DOI: 08/24/2006     Model #: [id number]     Serial #: [id number]     Status: active   Indications:  Tachy-Brady Syndrome   PPM Follow Up Pacer Dependent:  No      Episodes Coumadin:  Yes  Parameters Mode:  DDDR+     Lower Rate Limit:  60     Upper Rate Limit:  130 Paced AV Delay:  150     Sensed AV Delay:  120  Impression & Recommendations:  Problem # 1:  CORONARY ARTERY BYPASS GRAFT, HX OF (ICD-V45.81)  No recurrent symptoms.  Mild SOB  at times, sporadic.  No failure on exam presently.  Orders: EKG w/ Interpretation (93000)  Problem # 2:  ATRIAL FIBRILLATION (ICD-427.31)  Last in NSR.   His updated medication list for this problem includes:    Adult Aspirin Low Strength 81 Mg Tbdp (Aspirin) .[name] Kitchen... Take 1 tablet by mouth once a day    Metoprolol Tartrate 25 Mg Tabs (Metoprolol tartrate) .[name] Kitchen... Take 1 tablet by mouth two times a day    Warfarin Sodium 1 Mg Tabs (Warfarin sodium) ..... Use as directed  His updated medication list for this problem includes:    Adult Aspirin Low Strength 81 Mg Tbdp (Aspirin) .[name] Kitchen... Take 1 tablet by mouth once a day    Metoprolol Tartrate 25 Mg Tabs (Metoprolol tartrate) .[name] Kitchen... Take 1 tablet by mouth two times a day    Warfarin Sodium 1 Mg Tabs (Warfarin sodium) ..... Use as directed    His updated medication list for this problem includes:    Adult Aspirin Low Strength 81 Mg Tbdp (Aspirin) .[name] Kitchen... Take 1 tablet by mouth once a day    Metoprolol Tartrate 25 Mg Tabs (Metoprolol tartrate) .[name] Kitchen... Take 1 tablet by mouth two times a day    Warfarin Sodium 1 Mg Tabs (Warfarin sodium) ..... Use as directed  Orders: EKG w/ Interpretation (93000)  Problem # 3:  HYPERLIPIDEMIA (ICD-272.4)  Followed by Dr. Dagoberto Ligas. His updated medication list for this problem includes:    Crestor 10 Mg Tabs (Rosuvastatin calcium) .[name] Kitchen... Take 1 tablet by mouth once a day  His updated medication list for this problem includes:    Crestor 10 Mg Tabs (Rosuvastatin calcium) .[name] Kitchen... Take 1 tablet by mouth once a day  His updated medication list for this problem includes:    Crestor 10 Mg Tabs (Rosuvastatin calcium) .[name] Kitchen... Take 1 tablet by mouth once a day  Problem # 4:  BRONCHIECTASIS (ICD-494.0) Followed by Dr. Delton Coombes  Patient Instructions: 1)  Your physician recommends that you schedule a follow-up appointment in: 3 MONTHS 2)  Your physician recommends that you continue on your current medications as directed. Please  refer to the Current Medication list given to you today. Prescriptions: METOPROLOL TARTRATE 25 MG  TABS (METOPROLOL TARTRATE) Take 1 tablet by mouth two times a day  #180 x 3   Entered by:   Julieta Gutting, RN, BSN   Authorized by:   Ronaldo Miyamoto, MD, [facility]  Signed by:   Julieta Gutting, RN, BSN on 03/10/2010   Method used:   Faxed to ...       [facility] (mail-order)             , [place]         Ph:        Fax: [phone]   RxID:   [id number]

## 2011-01-21 NOTE — Progress Notes (Signed)
Summary: INR results  Phone Note Call from Patient Call back at [facility] Phone [phone]   Caller: Patient Summary of Call: PT WANT INR RESULTS SENT TO [phone] DR.GALLEHAN OFFICE ATT SUSIE Initial call taken by: Judie Grieve,  January 05, 2010 12:22 PM  Follow-up for Phone Call        pt notified at 1700 that this was completed prior to him leaving office.[place]ford Poli Follow-up by: Cloyde Reams RN,  January 05, 2010 5:05 PM

## 2011-01-21 NOTE — Letter (Signed)
Summary: Remote Device Check  [facility], Main Office  [address]   [place], [place] [address]   Phone: [phone]  Fax: [phone]     June 18, 2010 MRN: [id number]   [facility] [address] [place], [place]  [address]   Dear Mr. HAYTER,   Your remote transmission was recieved and reviewed by your physician.  All diagnostics were within normal limits for you.   __X____Your next office visit is scheduled for:  September with Dr Ladona Ridgel.                      Please call our office to schedule an appointment.    Sincerely,  Vella Kohler

## 2011-01-21 NOTE — Cardiovascular Report (Signed)
Summary: Office Visit Remote   Office Visit Remote   Imported By: Roderic Ovens 03/19/2010 11:49:58  _____________________________________________________________________  External Attachment:    Type:   Image     Comment:   External Document

## 2011-01-21 NOTE — Medication Information (Signed)
Summary: Physician's Order  Physician's Order   Imported By: Roderic Ovens 06/08/2010 15:03:07  _____________________________________________________________________  External Attachment:    Type:   Image     Comment:   External Document

## 2011-01-21 NOTE — Progress Notes (Signed)
Summary: refills needed/will need new rx called in for new year  Phone Note Refill Request Call back at Home Phone [phone] Message from:  Patient  Refills Requested: Medication #1:  METOPROLOL TARTRATE 25 MG  TABS Take 1 tablet by mouth two times a day   Supply Requested: 3 months  Medication #2:  WARFARIN SODIUM 1 MG  TABS use as directed   Supply Requested: 3 months New rx needed [facility]  [phone]   Method Requested: Electronic Initial call taken by: Burnard Leigh,  December 22, 2009 4:33 PM Caller: Patient Reason for Call: Talk to Nurse  Follow-up for Phone Call        Rx faxed to pharmacy Follow-up by: Vikki Ports,  December 23, 2009 1:05 PM    Prescriptions: METOPROLOL TARTRATE 25 MG  TABS (METOPROLOL TARTRATE) Take 1 tablet by mouth two times a day  #180 x 3   Entered by:   Vikki Ports   Authorized by:   Ronaldo Miyamoto, MD, [facility]   Signed by:   Vikki Ports on 12/23/2009   Method used:   Faxed to ...       [facility] (mail-order)             , [place]         Ph:        Fax: [phone]   RxID:   [id number]

## 2011-01-21 NOTE — Miscellaneous (Signed)
Summary: [facility] Orders   [facility] Orders   Imported By: Roderic Ovens 08/31/2010 13:39:29  _____________________________________________________________________  External Attachment:    Type:   Image     Comment:   External Document

## 2011-01-21 NOTE — Medication Information (Signed)
Summary: [facility]  Anticoagulant Therapy  Managed by: Bethena Midget, RN, BSN Referring MD: Shawnie Pons MD PCP: (Primary Care Dr Dagoberto Ligas ) ( Dentist DRChou) (ENT DR Thayer Ohm Newman)(Eyes DR Pearlean Brownie) (Urologist DR Vonita Moss) (Dermatologist Dr Romeo Apple Turner)(Rheumatologist DR Zacarias Pontes ) ((Orthopedic DR Theron Arista Whitfield)(Allegist DR [place])(Pulmonary Dr Levy Pupa) Supervising MD: Myrtis Ser MD, Tinnie Gens Indication 1: Atrial Fibrillation Lab Used: [facility] [place] Site: [address] PT 27.1 INR POC 2.54 INR RANGE 2 - 3  Dietary changes: no    Health status changes: no    Bleeding/hemorrhagic complications: no    Recent/future hospitalizations: no    Any changes in medication regimen? yes       Details: Finished Avelox on Monday.  Recent/future dental: no  Any missed doses?: no       Is patient compliant with meds? yes      Comments: Last week pt took 1mg s on Tues and Thurs.  Allergies: 1)  ! Penicillin 2)  ! Sulfa 3)  ! Flagyl 4)  ! Doxycycline Hyclate (Doxycycline Hyclate) 5)  ! Erythromycin 6)  ! Tetracycline 7)  ! * Clindamycin  Anticoagulation Management History:      His anticoagulation is being managed by telephone today.  Positive risk factors for bleeding include an age of 63 years or older.  The bleeding index is 'intermediate risk'.  Positive CHADS2 values include Age > 37 years old.  The start date was 08/26/2005.  Prothrombin time is 27.1.  Anticoagulation responsible provider: Myrtis Ser MD, Tinnie Gens.  INR POC: 2.54.    Anticoagulation Management Assessment/Plan:      The patient's current anticoagulation dose is Warfarin sodium 1 mg  tabs: use as directed.  The target INR is 2.0-3.0.  The next INR is due 06/19/2010.  Anticoagulation instructions were given to patient.  Results were reviewed/authorized by Bethena Midget, RN, BSN.  He was notified by Bethena Midget, RN, BSN.         Prior Anticoagulation Instructions: INR 2.7  Spoke with pt.  Continue  same dose of 1/2 tablet every day except 1 tablet on Tuesday, Thursday and Saturday.  Recheck in 1 week. Faxed orders to [facility]   Current Anticoagulation Instructions: INR 2.54 Continue 0.5mg s daily except 1mg s on TT&Sat. Recheck in 2 weeks.  Orders given to [facility] (Red team)

## 2011-01-21 NOTE — Cardiovascular Report (Signed)
Summary: Office Visit Remote   Office Visit Remote   Imported By: Roderic Ovens 06/19/2010 16:36:07  _____________________________________________________________________  External Attachment:    Type:   Image     Comment:   External Document

## 2011-01-21 NOTE — Medication Information (Signed)
Summary: rov/tm  Anticoagulant Therapy  Managed by: Bethena Midget, RN, BSN Referring MD: Shawnie Pons MD PCP: (Primary Care Dr Dagoberto Ligas ) ( Dentist DRChou) (ENT DR Thayer Ohm Newman)(Eyes DR Pearlean Brownie) (Urologist DR Vonita Moss) (Dermatologist Dr Romeo Apple Turner)(Rheumatologist DR Zacarias Pontes ) ((Orthopedic DR Theron Arista Whitfield)(Allegist DR [place])(Pulmonary Dr Levy Pupa) Supervising MD: Graciela Husbands MD, Viviann Spare Indication 1: Atrial Fibrillation Lab Used: [facility] Site: [facility] INR POC 2.2 INR RANGE 2 - 3  Dietary changes: no    Health status changes: no    Bleeding/hemorrhagic complications: no    Recent/future hospitalizations: no    Any changes in medication regimen? no    Recent/future dental: no  Any missed doses?: no       Is patient compliant with meds? yes       Current Medications (verified): 1)  Adult Aspirin Low Strength 81 Mg  Tbdp (Aspirin) .... Take 1 Tablet By Mouth Once A Day 2)  Metoprolol Tartrate 25 Mg  Tabs (Metoprolol Tartrate) .... Take 1 Tablet By Mouth Two Times A Day 3)  Citracal Plus  Tabs (Multiple Minerals-Vitamins) .... Take 1 Tablet Three Times Daily 4)  Warfarin Sodium 1 Mg  Tabs (Warfarin Sodium) .... Use As Directed 5)  Crestor 10 Mg  Tabs (Rosuvastatin Calcium) .... Take 1 Tablet By Mouth Once A Day 6)  Fexofenadine Hcl 180 Mg  Tabs (Fexofenadine Hcl) .... Take 1 Tablet By Mouth Once A Day 7)  Finasteride 5 Mg  Tabs (Finasteride) .... Take 1 Tablet By Mouth Once A Day 8)  Fluticasone Propionate 50 Mcg/act  Susp (Fluticasone Propionate) .... Take 2 Sprays in Each Nostril Once A Day 9)  Terazosin Hcl 5 Mg  Caps (Terazosin Hcl) .... Take 1 Capsule By Mouth Once A Day 10)  Omeprazole 40 Mg Cpdr (Omeprazole) .... Take 1 Capsule Every Day 11)  Lasix 20 Mg  Tabs (Furosemide) .... Take 1 Tablet By Mouth Once A Day 12)  Lyrica 75 Mg Caps (Pregabalin) .... Take One Capsule Daily 13)  Alendronate Sodium 70 Mg Tabs (Alendronate Sodium) .... Take 1 Tablet Evey  Week 14)  Fish Oil 1000 Mg Caps (Omega-3 Fatty Acids) .... Take One Capsule Daily 15)  Coq-10 100 Mg Caps (Coenzyme Q10) .... Take One Capsule Daily 16)  Axona  Pack (Dietary Management Product) .... Use 1/2 Pack Daily 17)  Ambien 10 Mg Tabs (Zolpidem Tartrate) .... Take 1/2 Tab Qhs  Allergies: 1)  ! Penicillin 2)  ! Sulfa 3)  ! Flagyl 4)  ! Doxycycline Hyclate (Doxycycline Hyclate) 5)  ! Erythromycin 6)  ! Tetracycline 7)  ! * Clindamycin  Anticoagulation Management History:      The patient is taking warfarin and comes in today for a routine follow up visit.  Positive risk factors for bleeding include an age of 75 years or older.  The bleeding index is 'intermediate risk'.  Positive CHADS2 values include Age > 75 years old.  The start date was 08/26/2005.  Anticoagulation responsible provider: Graciela Husbands MD, Viviann Spare.  INR POC: 2.2.  Cuvette Lot#: [id number].  Exp: 04/2011.    Anticoagulation Management Assessment/Plan:      The patient's current anticoagulation dose is Warfarin sodium 1 mg  tabs: use as directed.  The target INR is 2.0-3.0.  The next INR is due 04/21/2010.  Anticoagulation instructions were given to patient.  Results were reviewed/authorized by Bethena Midget, RN, BSN.  He was notified by Bethena Midget, RN, BSN.         Prior Anticoagulation  Instructions: INR 2.1 Continue 0.5mg s daily except 1mg s on Tuesdays, Thursdays and Saturdays. Recheck in 4 weeks.   Current Anticoagulation Instructions: INR 2.2 Continue 0.5mg s daily except 1mg s on Tuesdays, Thursdays and Saturdays. Recheck in 4 weeks.

## 2011-01-21 NOTE — Progress Notes (Signed)
Summary: refill request  Phone Note Refill Request Message from:  Patient on October 21, 2010 9:45 AM  Refills Requested: Medication #1:  WARFARIN SODIUM 1 MG  TABS use as directed  Medication #2:  METOPROLOL TARTRATE 25 MG  TABS Take 1 tablet by mouth two times a day [facility]   Method Requested: Electronic Initial call taken by: Glynda Jaeger,  October 21, 2010 9:46 AM  Follow-up for Phone Call       Follow-up by: Judithe Modest CMA,  October 21, 2010 4:16 PM    Prescriptions: METOPROLOL TARTRATE 25 MG  TABS (METOPROLOL TARTRATE) Take 1 tablet by mouth two times a day  #180 x 3   Entered by:   Judithe Modest CMA   Authorized by:   Ronaldo Miyamoto, MD, [facility]   Signed by:   Judithe Modest CMA on 10/21/2010   Method used:   Faxed to ...       [facility] (mail-order)             , [place]         Ph:        Fax: [phone]   RxID:   [id number]

## 2011-01-21 NOTE — Miscellaneous (Signed)
Summary: [facility] Progress Note  [facility]   Imported By: Roderic Ovens 04/27/2010 10:45:54  _____________________________________________________________________  External Attachment:    Type:   Image     Comment:   External Document

## 2011-01-21 NOTE — Letter (Signed)
Summary: [facility] Office Note  [facility] Office Note   Imported By: Roderic Ovens 06/09/2010 13:41:43  _____________________________________________________________________  External Attachment:    Type:   Image     Comment:   External Document

## 2011-01-21 NOTE — Medication Information (Signed)
Summary: rov/ln  Anticoagulant Therapy  Managed by: Bethena Midget, RN, BSN Referring MD: Shawnie Pons MD PCP: (Primary Care Dr Dagoberto Ligas ) ( Dentist DRChou) (ENT DR Thayer Ohm Newman)(Eyes DR Pearlean Brownie) (Urologist DR Vonita Moss) (Dermatologist Dr Romeo Apple Turner)(Rheumatologist DR Zacarias Pontes ) ((Orthopedic DR Theron Arista Whitfield)(Allegist DR [place])(Pulmonary Dr Levy Pupa) Supervising MD: Jens Som MD, Arlys John Indication 1: Atrial Fibrillation Lab Used: [facility] [place] Site: [address] INR POC 2.2 INR RANGE 2 - 3  Dietary changes: no    Health status changes: no    Bleeding/hemorrhagic complications: no    Recent/future hospitalizations: no    Any changes in medication regimen? no    Recent/future dental: no  Any missed doses?: no       Is patient compliant with meds? yes       Allergies: 1)  ! Penicillin 2)  ! Sulfa 3)  ! Flagyl 4)  ! Doxycycline Hyclate (Doxycycline Hyclate) 5)  ! Erythromycin 6)  ! Tetracycline 7)  ! * Clindamycin  Anticoagulation Management History:      The patient is taking warfarin and comes in today for a routine follow up visit.  Positive risk factors for bleeding include an age of 75 years or older.  The bleeding index is 'intermediate risk'.  Positive CHADS2 values include Age > 49 years old.  The start date was 08/26/2005.  Anticoagulation responsible provider: Jens Som MD, Arlys John.  INR POC: 2.2.  Cuvette Lot#: [id number].  Exp: 09/2011.    Anticoagulation Management Assessment/Plan:      The patient's current anticoagulation dose is Warfarin sodium 1 mg  tabs: use as directed.  The target INR is 2.0-3.0.  The next INR is due 08/06/2010.  Anticoagulation instructions were given to patient.  Results were reviewed/authorized by Bethena Midget, RN, BSN.  He was notified by Bethena Midget, RN, BSN.         Prior Anticoagulation Instructions: INR 1.8  Take 1 tab today, then change to 1/2 tab on Monday, Wednesday, and Friday and 1 tab all other  days.  Re-check INR in 2 weeks.    Current Anticoagulation Instructions: INR 2.2 Continue 1mg s daily except 0.5mg s on Mondays, Wednesdays and Fridays. Recheck in 3 weeks.

## 2011-01-21 NOTE — Assessment & Plan Note (Signed)
Summary: f69m   Primary Provider:  (Primary Care Dr Dagoberto Ligas ) ( Dentist DRChou) (ENT DR Thayer Ohm Newman)(Eyes DR Pearlean Brownie) (Urologist DR Vonita Moss) (Dermatologist Dr Romeo Apple Turner)(Rheumatologist DR Zacarias Pontes ) ((Orthopedic DR Theron Arista Whitfield)(Allegist DR Bassfield)(Pulmonary Dr Levy Pupa)  CC:  weakness.  History of Present Illness: recently discharged after presenting with gall bladder sepsis.  underwent cholystectomy, lap.  Overall did well and back on warfarin at this point in time. Denies any chest pain.   Current Medications (verified): 1)  Adult Aspirin Low Strength 81 Mg  Tbdp (Aspirin) .... Take 1 Tablet By Mouth Once A Day 2)  Metoprolol Tartrate 25 Mg  Tabs (Metoprolol Tartrate) .... Take 1 Tablet By Mouth Two Times A Day 3)  Citracal Plus  Tabs (Multiple Minerals-Vitamins) .... Take 1 Tablet Three Times Daily 4)  Warfarin Sodium 1 Mg  Tabs (Warfarin Sodium) .... Use As Directed 5)  Crestor 10 Mg  Tabs (Rosuvastatin Calcium) .... Take 1 Tablet By Mouth Once A Day 6)  Fexofenadine Hcl 180 Mg  Tabs (Fexofenadine Hcl) .... Take 1 Tablet By Mouth Once A Day 7)  Finasteride 5 Mg  Tabs (Finasteride) .... Take 1 Tablet By Mouth Once A Day 8)  Fluticasone Propionate 50 Mcg/act  Susp (Fluticasone Propionate) .... Take 2 Sprays in Each Nostril Once A Day 9)  Terazosin Hcl 5 Mg  Caps (Terazosin Hcl) .... Take 1 Capsule By Mouth Once A Day 10)  Omeprazole 40 Mg Cpdr (Omeprazole) .... Take 1 Capsule Every Day 11)  Lasix 20 Mg  Tabs (Furosemide) .... 1/2 Tab Once Daily 12)  Lyrica 50 Mg Caps (Pregabalin) .[name] Kitchen.. 1 Cap Once Daily 13)  Alendronate Sodium 70 Mg Tabs (Alendronate Sodium) .... Take 1 Tablet Evey Week 14)  Fish Oil 1000 Mg Caps (Omega-3 Fatty Acids) .... Take One Capsule Daily 15)  Coq-10 100 Mg Caps (Coenzyme Q10) .... Take One Capsule Daily 16)  Ambien 10 Mg Tabs (Zolpidem Tartrate) .... Take 1/2 Tab Qhs  Allergies: 1)  ! Penicillin 2)  ! Sulfa 3)  ! Flagyl 4)  ! Doxycycline Hyclate  (Doxycycline Hyclate) 5)  ! Erythromycin 6)  ! Tetracycline 7)  ! * Clindamycin  Vital Signs:  Patient profile:   77 year old male Height:      70 inches Weight:      178 pounds BMI:     25.63 Pulse rate:   70 / minute Pulse rhythm:   regular BP sitting:   106 / 62  (left arm) Cuff size:   regular  Vitals Entered By: Danielle Rankin, CMA (June 15, 2010 10:10 AM)  Physical Exam  General:  Well developed, well nourished, in no acute distress. Head:  normocephalic and atraumatic Eyes:  PERRLA/EOM intact; conjunctiva and lids normal. Neck:  NO bruits Lungs:  Decrease BS R base. Heart:  PMI nondisplaced.  Normal S1 and S2.  No definite murmur. Abdomen:  reasonably soft without masses.  Cholecystectomy lap scars.  Extremities:  No clubbing or cyanosis. Neurologic:  Alert and oriented x 3.   Echocardiogram  Procedure date:  05/13/2010  Findings:       Impressions:    - Technically difficult study with poor acoustic windows. The     patient was in atrial fibrillation. Normal LV size with mild LV     hypertrophy, EF 55-60%. Images were inadequate for wall motion     analysis. Mild to moderate mitral regurgitation. Moderate left     atrial enlargement. Normal RV size and  systolic function.   EKG  Procedure date:  06/15/2010  Findings:      V pacing.  Underlying atrial fibrillation.  PPM Specifications Following MD:  [name], MD     PPM Vendor:  [employer]     PPM Model Number:  [id number]     PPM Serial Number:  [id number] PPM DOI:  08/24/2006     PPM Implanting MD:  [name], MD  Lead 1    Location: [facility]     DOI: 08/24/2006     Model #: [id number]     Serial #: [id number]     Status: active Lead 2    Location: [facility]     DOI: 08/24/2006     Model #: [id number]     Serial #: [id number]     Status: active   Indications:  Tachy-Brady Syndrome   PPM Follow Up Pacer Dependent:  No      Episodes Coumadin:  Yes  Parameters Mode:  DDDR+     Lower Rate Limit:  60     Upper Rate  Limit:  130 Paced AV Delay:  150     Sensed AV Delay:  120  Impression & Recommendations:  Problem # 1:  CORONARY ARTERY BYPASS GRAFT, HX OF (ICD-V45.81)  Stable.  No chest pain.  Tolerated surgery reasonably well.    Orders: EKG w/ Interpretation (93000)  Problem # 2:  ATRIAL FIBRILLATION (ICD-427.31)  currently in afib.  Rate controlled.  On warfarin with therapuetic INR.  His updated medication list for this problem includes:    Adult Aspirin Low Strength 81 Mg Tbdp (Aspirin) .[name] Kitchen... Take 1 tablet by mouth once a day    Metoprolol Tartrate 25 Mg Tabs (Metoprolol tartrate) .[name] Kitchen... Take 1 tablet by mouth two times a day    Warfarin Sodium 1 Mg Tabs (Warfarin sodium) ..... Use as directed  Orders: EKG w/ Interpretation (93000)  Problem # 3:  HYPERLIPIDEMIA (ICD-272.4)  stable at present. Continue current meds. His updated medication list for this problem includes:    Crestor 10 Mg Tabs (Rosuvastatin calcium) .[name] Kitchen... Take 1 tablet by mouth once a day  Orders: EKG w/ Interpretation (93000)  Patient Instructions: 1)  Your physician recommends that you schedule a follow-up appointment in: 2 MONTHS 2)  Your physician recommends that you have lab work: CBC and BMP (order given to patient for home health to draw) 3)  Your physician recommends that you continue on your current medications as directed. Please refer to the Current Medication list given to you today.

## 2011-01-21 NOTE — Medication Information (Signed)
Summary: rov/eac  Anticoagulant Therapy  Managed by: Weston Brass, PharmD Referring MD: Shawnie Pons MD PCP: (Primary Care Dr Dagoberto Ligas ) ( Dentist DRChou) (ENT DR Thayer Ohm Newman)(Eyes DR Pearlean Brownie) (Urologist DR Vonita Moss) (Dermatologist Dr Romeo Apple Turner)(Rheumatologist DR Zacarias Pontes ) ((Orthopedic DR Theron Arista Whitfield)(Allegist DR McKenzie)(Pulmonary Dr Levy Pupa) Supervising MD: Jens Som MD, Arlys John Indication 1: Atrial Fibrillation Lab Used: [facility] [place] Site: [address] INR POC 2.3 INR RANGE 2 - 3  Dietary changes: no    Health status changes: no    Bleeding/hemorrhagic complications: no    Recent/future hospitalizations: no    Any changes in medication regimen? no    Recent/future dental: no  Any missed doses?: no       Is patient compliant with meds? yes       Allergies: 1)  ! Penicillin 2)  ! Sulfa 3)  ! Flagyl 4)  ! Doxycycline Hyclate (Doxycycline Hyclate) 5)  ! Erythromycin 6)  ! Tetracycline 7)  ! * Clindamycin  Anticoagulation Management History:      The patient is taking warfarin and comes in today for a routine follow up visit.  Positive risk factors for bleeding include an age of 25 years or older.  The bleeding index is 'intermediate risk'.  Positive CHADS2 values include Age > 71 years old.  The start date was 08/26/2005.  Today's INR is 2.3.  Anticoagulation responsible provider: Jens Som MD, Arlys John.  INR POC: 2.3.  Cuvette Lot#: [id number].  Exp: 10/2011.    Anticoagulation Management Assessment/Plan:      The patient's current anticoagulation dose is Warfarin sodium 1 mg  tabs: use as directed.  The target INR is 2.0-3.0.  The next INR is due 10/27/2010.  Anticoagulation instructions were given to patient.  Results were reviewed/authorized by Weston Brass, PharmD.  He was notified by Haynes Hoehn, PharmD Candidate.         Prior Anticoagulation Instructions: INR 2.3  Continue taking 1/2 tablet on Monday, Wednesday, and Friday and 1 tablet  all other days.  Return to clinic in 4 weeks.    Current Anticoagulation Instructions: INR 2.3  Continue Coumadin as scheduled:  1 tablet every day of the week, except 1/2 tablet on Monday, Wednesday, and Friday.   Return to clinic in 4 weeks.

## 2011-01-21 NOTE — Letter (Signed)
Summary: Handout Printed  Printed Handout:  - Coumadin Instructions-w/out Meds 

## 2011-01-21 NOTE — Assessment & Plan Note (Signed)
Summary: f10m   Visit Type:  2 months follow up Primary Provider:  (Primary Care Dr Dagoberto Ligas ) ( Dentist DRChou) (ENT DR Thayer Ohm Newman)(Eyes DR Pearlean Brownie) (Urologist DR Vonita Moss) (Dermatologist Dr Romeo Apple Turner)(Rheumatologist DR Zacarias Pontes ) ((Orthopedic DR Theron Arista Whitfield)(Allegist DR [place])(Pulmonary Dr Levy Pupa)   History of Present Illness: Patient had gallbladder surgery, and has done well.  His appetite is relatively good, but he fils up pretty quick.  While in the hospital, he eventually recoverd.  He had gallbladder sepsis, and became fairly sick prior to having successful surgery.  Now seems much improved.   Current Medications (verified): 1)  Adult Aspirin Low Strength 81 Mg  Tbdp (Aspirin) .... Take 1 Tablet By Mouth Once A Day 2)  Metoprolol Tartrate 25 Mg  Tabs (Metoprolol Tartrate) .... Take 1 Tablet By Mouth Two Times A Day 3)  Citracal Plus  Tabs (Multiple Minerals-Vitamins) .... Take 1 Tablet Three Times Daily 4)  Warfarin Sodium 1 Mg  Tabs (Warfarin Sodium) .... Use As Directed 5)  Crestor 10 Mg  Tabs (Rosuvastatin Calcium) .... Take 1 Tablet By Mouth Once A Day 6)  Fexofenadine Hcl 180 Mg  Tabs (Fexofenadine Hcl) .... Take 1 Tablet By Mouth Once A Day 7)  Finasteride 5 Mg  Tabs (Finasteride) .... Take 1 Tablet By Mouth Once A Day 8)  Fluticasone Propionate 50 Mcg/act  Susp (Fluticasone Propionate) .... Take 2 Sprays in Each Nostril Once A Day 9)  Terazosin Hcl 5 Mg  Caps (Terazosin Hcl) .... Take 1 Capsule By Mouth Once A Day 10)  Omeprazole 40 Mg Cpdr (Omeprazole) .... Take 1 Capsule Every Day 11)  Lasix 20 Mg  Tabs (Furosemide) .... 1/2 Tab Once Dailyon Hold 12)  Lyrica 75 Mg Caps (Pregabalin) .... Take 1 Tablet By Mouth Once A Day 13)  Alendronate Sodium 70 Mg Tabs (Alendronate Sodium) .... Take 1 Tablet Evey Week 14)  Coq-10 100 Mg Caps (Coenzyme Q10) .... Take One Capsule Daily 15)  Ambien 10 Mg Tabs (Zolpidem Tartrate) .... Take 1/2 Tab Qhs  Allergies: 1)  !  Penicillin 2)  ! Sulfa 3)  ! Flagyl 4)  ! Doxycycline Hyclate (Doxycycline Hyclate) 5)  ! Erythromycin 6)  ! Tetracycline 7)  ! * Clindamycin  Vital Signs:  Patient profile:   77 year old male Height:      70 inches Weight:      173.50 pounds BMI:     24.98 Pulse rate:   67 / minute Pulse rhythm:   irregular Resp:     18 per minute BP sitting:   124 / 74  (left arm) Cuff size:   large  Vitals Entered By: Vikki Ports (August 19, 2010 10:01 AM)  Physical Exam  General:  Well developed, well nourished, in no acute distress. Head:  normocephalic and atraumatic Eyes:  PERRLA/EOM intact; conjunctiva and lids normal. Lungs:  slight decrease breath sounds in bases.  Heart:  PMI non displaced.  Normal S1 and S2 with widely split S2.  No DM. Abdomen:  Bowel sounds positive; abdomen soft and non-tender without masses, organomegaly, or hernias noted. No hepatosplenomegaly. Extremities:  No clubbing or cyanosis. Neurologic:  Alert and oriented x 3.   EKG  Procedure date:  08/01/2010  Findings:      VVI pacing.  Atrial fibrillatin at baseline.    PPM Specifications Following MD:  [name], MD     PPM Vendor:  [employer]     PPM Model Number:  [id number]  PPM Serial Number:  [id number] PPM DOI:  08/24/2006     PPM Implanting MD:  [name], MD  Lead 1    Location: [facility]     DOI: 08/24/2006     Model #: [id number]     Serial #: [id number]     Status: active Lead 2    Location: [facility]     DOI: 08/24/2006     Model #: [id number]     Serial #: [id number]     Status: active   Indications:  Tachy-Brady Syndrome   PPM Follow Up Pacer Dependent:  No      Episodes Coumadin:  Yes  Parameters Mode:  DDDR+     Lower Rate Limit:  60     Upper Rate Limit:  130 Paced AV Delay:  150     Sensed AV Delay:  120  Impression & Recommendations:  Problem # 1:  CAD, ARTERY BYPASS GRAFT (ICD-414.04) stable.  In the hospital recently for gallbladder sepsis.  Now better. Denies chest pain.  Had  cholycestecomy.  Doing well overall.   His updated medication list for this problem includes:    Adult Aspirin Low Strength 81 Mg Tbdp (Aspirin) .[name] Kitchen... Take 1 tablet by mouth once a day    Metoprolol Tartrate 25 Mg Tabs (Metoprolol tartrate) .[name] Kitchen... Take 1 tablet by mouth two times a day    Warfarin Sodium 1 Mg Tabs (Warfarin sodium) ..... Use as directed  Problem # 2:  ATRIAL FIBRILLATION (ICD-427.31) rate controlled with VVI pacing. His updated medication list for this problem includes:    Adult Aspirin Low Strength 81 Mg Tbdp (Aspirin) .[name] Kitchen... Take 1 tablet by mouth once a day    Metoprolol Tartrate 25 Mg Tabs (Metoprolol tartrate) .[name] Kitchen... Take 1 tablet by mouth two times a day    Warfarin Sodium 1 Mg Tabs (Warfarin sodium) ..... Use as directed  Problem # 3:  HYPERLIPIDEMIA (ICD-272.4) followed by Dr. Dagoberto Ligas. His updated medication list for this problem includes:    Crestor 10 Mg Tabs (Rosuvastatin calcium) .[name] Kitchen... Take 1 tablet by mouth once a day  Other Orders: EKG w/ Interpretation (93000)  Patient Instructions: 1)  Your physician recommends that you schedule a follow-up appointment in: 3 MONTHS with Dr Riley Kill 2)  Your physician recommends that you continue on your current medications as directed. Please refer to the Current Medication list given to you today.

## 2011-01-21 NOTE — Medication Information (Signed)
Summary: rov/tm  Anticoagulant Therapy  Managed by: Leota Sauers, PharmD, BCPS, CPP Referring MD: Shawnie Pons MD PCP: (Primary Care Dr Dagoberto Ligas ) ( Dentist DRChou) (ENT DR Thayer Ohm Newman)(Eyes DR Pearlean Brownie) (Urologist DR Vonita Moss) (Dermatologist Dr Romeo Apple Turner)(Rheumatologist DR Zacarias Pontes ) ((Orthopedic DR Theron Arista Whitfield)(Allegist DR Buck[place])(Pulmonary Dr Levy Pupa) Supervising MD: Daleen Squibb MD, Maisie Fus Indication 1: Atrial Fibrillation Lab Used: [facility]  Site: [facility] INR POC 2.7 INR RANGE 2 - 3  Dietary changes: no    Health status changes: no    Bleeding/hemorrhagic complications: no    Recent/future hospitalizations: no    Any changes in medication regimen? no    Recent/future dental: no  Any missed doses?: no       Is patient compliant with meds? yes       Current Medications (verified): 1)  Adult Aspirin Low Strength 81 Mg  Tbdp (Aspirin) .... Take 1 Tablet By Mouth Once A Day 2)  Metoprolol Tartrate 25 Mg  Tabs (Metoprolol Tartrate) .... Take 1 Tablet By Mouth Two Times A Day 3)  Citracal Plus  Tabs (Multiple Minerals-Vitamins) .... Take 1 Tablet Three Times Daily 4)  Warfarin Sodium 1 Mg  Tabs (Warfarin Sodium) .... Use As Directed 5)  Crestor 10 Mg  Tabs (Rosuvastatin Calcium) .... Take 1 Tablet By Mouth Once A Day 6)  Fexofenadine Hcl 180 Mg  Tabs (Fexofenadine Hcl) .... Take 1 Tablet By Mouth Once A Day 7)  Finasteride 5 Mg  Tabs (Finasteride) .... Take 1 Tablet By Mouth Once A Day 8)  Fluticasone Propionate 50 Mcg/act  Susp (Fluticasone Propionate) .... Take 2 Sprays in Each Nostril Once A Day 9)  Terazosin Hcl 5 Mg  Caps (Terazosin Hcl) .... Take 1 Capsule By Mouth Once A Day 10)  Omeprazole 40 Mg Cpdr (Omeprazole) .... Take 1 Capsule Every Day 11)  Lasix 20 Mg  Tabs (Furosemide) .... Take 1 Tablet By Mouth Once A Day 12)  Lyrica 75 Mg Caps (Pregabalin) .... Take One Capsule Daily 13)  Alendronate Sodium 70 Mg Tabs (Alendronate Sodium) .... Take 1 Tablet  Evey Week 14)  Fish Oil 1000 Mg Caps (Omega-3 Fatty Acids) .... Take One Capsule Daily 15)  Coq-10 100 Mg Caps (Coenzyme Q10) .... Take One Capsule Daily 16)  Axona  Pack (Dietary Management Product) .... Use 1/2 Pack Daily 17)  Ambien 10 Mg Tabs (Zolpidem Tartrate) .... Take 1/2 Tab Qhs  Allergies: 1)  ! Penicillin 2)  ! Sulfa 3)  ! Flagyl 4)  ! Doxycycline Hyclate (Doxycycline Hyclate) 5)  ! Erythromycin 6)  ! Tetracycline 7)  ! * Clindamycin  Anticoagulation Management History:      The patient is taking warfarin and comes in today for a routine follow up visit.  Positive risk factors for bleeding include an age of 17 years or older.  The bleeding index is 'intermediate risk'.  Positive CHADS2 values include Age > 61 years old.  The start date was 08/26/2005.  Anticoagulation responsible provider: Daleen Squibb MD, Maisie Fus.  INR POC: 2.7.  Cuvette Lot#: [id number].  Exp: 04/2011.    Anticoagulation Management Assessment/Plan:      The patient's current anticoagulation dose is Warfarin sodium 1 mg  tabs: use as directed.  The target INR is 2.0-3.0.  The next INR is due 05/19/2010.  Anticoagulation instructions were given to patient.  Results were reviewed/authorized by Leota Sauers, PharmD, BCPS, CPP.         Prior Anticoagulation Instructions: INR 2.2 Continue 0.5mg s daily except  1mg s on Tuesdays, Thursdays and Saturdays. Recheck in 4 weeks.   Current Anticoagulation Instructions: INR 2.7  Coumadin 1 mg = 1 tab on Tue, Thur, Sat 1/2 tab = 0.5mg  on Sun, Mon, wed, Fri

## 2011-01-21 NOTE — Medication Information (Signed)
Summary: rov/sp  Anticoagulant Therapy  Managed by: Weston Brass, PharmD Referring MD: Shawnie Pons MD PCP: (Primary Care Dr Dagoberto Ligas ) ( Dentist DRChou) (ENT DR Thayer Ohm Newman)(Eyes DR Pearlean Brownie) (Urologist DR Vonita Moss) (Dermatologist Dr Romeo Apple Turner)(Rheumatologist DR Zacarias Pontes ) ((Orthopedic DR Theron Arista Whitfield)(Allegist DR [place])(Pulmonary Dr Levy Pupa) Supervising MD: Shirlee Latch MD, Freida Busman Indication 1: Atrial Fibrillation Lab Used: [facility] [place] Site: [address] INR RANGE 2 - 3  Dietary changes: no    Health status changes: no    Bleeding/hemorrhagic complications: no    Recent/future hospitalizations: no    Any changes in medication regimen? no    Recent/future dental: no  Any missed doses?: no       Is patient compliant with meds? yes       Allergies: 1)  ! Penicillin 2)  ! Sulfa 3)  ! Flagyl 4)  ! Doxycycline Hyclate (Doxycycline Hyclate) 5)  ! Erythromycin 6)  ! Tetracycline 7)  ! * Clindamycin  Anticoagulation Management History:      Positive risk factors for bleeding include an age of 4 years or older.  The bleeding index is 'intermediate risk'.  Positive CHADS2 values include Age > 73 years old.  The start date was 08/26/2005.  His last INR was 2.3.  Anticoagulation responsible provider: Shirlee Latch MD, [place].  Cuvette Lot#: [id number].  Exp: 12/2011.    Anticoagulation Management Assessment/Plan:      The patient's current anticoagulation dose is Warfarin sodium 1 mg  tabs: use as directed.  The target INR is 2.0-3.0.  The next INR is due 12/29/2010.  Anticoagulation instructions were given to patient.  Results were reviewed/authorized by Weston Brass, PharmD.         Prior Anticoagulation Instructions: INR 2.5  Continue same dose of 1 tablet every day except 1/2 tablet on Monday, Wednesday and Friday.   Recheck INR in 4 weeks.  Current Anticoagulation Instructions: INR 2.5 The patient is to continue with the same dose of coumadin.  This  dosage includes:  Take 1/2 tablet (0.5mg ) on Mon, Wed, and Fri Take 1 tablet (1mg ) on Sun, Tue, Thu, and Sat. Recheck INR in 4 weeks

## 2011-01-21 NOTE — Letter (Signed)
Summary: Remote Device Check  [employer], Main Office  [address]   [place], [place] [address]   Phone: [phone]  Fax: [phone]     March 17, 2010 MRN: [id number]   [facility] [address] [place], [place]  [address]   Dear Mr. BRIGHTWELL,   Your remote transmission was recieved and reviewed by your physician.  All diagnostics were within normal limits for you.  __X___Your next transmission is scheduled for:  June 03, 2010.  Please transmit at any time this day.  If you have a wireless device your transmission will be sent automatically.      Sincerely,  Proofreader

## 2011-01-21 NOTE — Assessment & Plan Note (Signed)
Summary: f59m   Primary Provider:  (Primary Care Dr Dagoberto Ligas ) ( Dentist DRChou) (ENT DR Thayer Ohm Newman)(Eyes DR Pearlean Brownie) (Urologist DR Vonita Moss) (Dermatologist Dr Romeo Apple Turner)(Rheumatologist DR Zacarias Pontes ) ((Orthopedic DR Theron Arista Whitfield)(Allegist DR Collier)(Pulmonary Dr Levy Pupa)   History of Present Illness: His physician has discussed use of Reclast.  He and I reviewed today, albeit with him understanding my exposure to this was limited.  Data reviewed.  OVerall cardiac wise doing well.  No chest pain.  Some shortness of breath with cold air, but otherwise doing well.  He slows down when that happens.  Had a fall about three or four months ago and hit his shoulder blade.    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 Take 1 Tablet By Mouth Once A Day 2)  Metoprolol Tartrate 25 Mg  Tabs (Metoprolol Tartrate) .... Take 1 Tablet By Mouth Two Times A Day 3)  Citracal Plus  Tabs (Multiple Minerals-Vitamins) .... Take 1 Tablet Three Times Daily 4)  Warfarin Sodium 1 Mg  Tabs (Warfarin Sodium) .... Use As Directed 5)  Crestor 10 Mg  Tabs (Rosuvastatin Calcium) .... Take 1 Tablet By Mouth Once A Day 6)  Finasteride 5 Mg  Tabs (Finasteride) .... Take 1 Tablet By Mouth Once A Day 7)  Fluticasone Propionate 50 Mcg/act  Susp (Fluticasone Propionate) .... Take 2 Sprays in Each Nostril Once A Day 8)  Terazosin Hcl 5 Mg  Caps (Terazosin Hcl) .... Take 1  Capsule By Mouth Once A Day 9)  Omeprazole 40 Mg Cpdr (Omeprazole) .... Take 1 Capsule Every Day 10)  Lyrica 75 Mg Caps (Pregabalin) .... Take 1 Tablet By Mouth Once A Day 11)  Alendronate Sodium 70 Mg Tabs (Alendronate Sodium) .... Take 1 Tablet Evey Week 12)  Ambien 10 Mg Tabs (Zolpidem Tartrate) .... Take 1/2 Tab Qhs 13)  Allegra Allergy 180 Mg Tabs (Fexofenadine Hcl) .[name] Kitchen.. 1 By Mouth Daily  Allergies (verified): 1)  ! Penicillin 2)  ! Sulfa 3)  ! Flagyl 4)  ! Doxycycline Hyclate (Doxycycline Hyclate) 5)  ! Erythromycin 6)  ! Tetracycline 7)  ! 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) Hx of EPISTAXIS (ICD-784.7) SINUSITIS, CHRONIC (ICD-473.9) ALLERGIC RHINITIS (ICD-477.9) SPINAL STENOSIS (ICD-724.00)  Vital Signs:  Patient profile:   77 year old male Height:      70 inches Weight:      167 pounds BMI:     24.05 Pulse rate:   77 / minute Resp:     16 per minute BP sitting:   148 / 91  (right arm)  Vitals Entered By: Marrion Coy, CNA (December 01, 2010 10:16 AM)  Physical Exam  General:  Well developed, well nourished, in no acute distress. Head:  normocephalic and atraumatic Eyes:  PERRLA/EOM intact; conjunctiva and lids normal. Lungs:  Clear bilaterally to auscultation and percussion. Heart:  PMI non displaced.  Normal S1 and S2...  Regular rhythm.  No def murmur. Abdomen:  Bowel sounds positive; abdomen soft and non-tender without masses, organomegaly, or hernias noted. No hepatosplenomegaly. Extremities:  No clubbing or cyanosis. Neurologic:  Alert and oriented x 3.   EKG  Procedure date:  12/01/2010  Findings:      atrial fib.  V pacing.  PPM  Specifications Following MD:  [name], MD     PPM Vendor:  [employer]     PPM Model Number:  ADDR01     PPM Serial Number:  [id number] PPM DOI:  08/24/2006     PPM Implanting MD:  [name], MD  Lead 1    Location: [facility]     DOI: 08/24/2006     Model #: [id number]     Serial #: [id number]     Status: active Lead 2    Location: [facility]     DOI: 08/24/2006     Model #: [id number]     Serial #: [id number]     Status: active   Indications:  Tachy-Brady Syndrome   PPM Follow Up Pacer Dependent:  No      Episodes Coumadin:  Yes  Parameters Mode:  MVP     Lower Rate Limit:  60     Upper Rate Limit:  130 Paced AV Delay:  150     Sensed AV Delay:  120  Impression & Recommendations:  Problem # 1:  CAD, ARTERY BYPASS GRAFT (ICD-414.04)  No chest pain.  Doing well cardiac wise.  His updated medication list for this problem includes:    Adult Aspirin Low Strength 81 Mg Tbdp (Aspirin) .[name] Kitchen... Take 1 tablet by mouth once a day    Metoprolol Tartrate 25 Mg Tabs (Metoprolol tartrate) .[name] Kitchen... Take 1 tablet by mouth two times a day    Warfarin Sodium 1 Mg Tabs (Warfarin sodium) ..... Use as directed  Orders: EKG w/ Interpretation (93000)  Problem # 2:  ATRIAL FIBRILLATION (ICD-427.31)  INR is optimal. His updated medication list for this problem includes:    Adult Aspirin Low Strength 81 Mg Tbdp (Aspirin) .[name] Kitchen... Take 1 tablet by mouth once a day    Metoprolol Tartrate 25 Mg Tabs (Metoprolol tartrate) .[name] Kitchen... Take 1 tablet by mouth two times a day    Warfarin Sodium 1 Mg Tabs (Warfarin sodium) ..... Use as directed  Orders: EKG w/ Interpretation (93000)  Problem # 3:  HYPERLIPIDEMIA (ICD-272.4) followed by Dr. Dagoberto Ligas.   His updated medication list for this problem includes:    Crestor 10 Mg Tabs (Rosuvastatin calcium) .[name] Kitchen... Take 1 tablet by mouth once a day  Patient Instructions: 1)  Your physician recommends that you continue on your current medications as directed. Please refer to the Current Medication  list given to you today. 2)  Your physician wants you to follow-up in: 4 MONTHS.  You will receive a reminder letter in the mail two months in advance. If you don't receive a letter, please call our office to schedule the follow-up appointment.

## 2011-01-21 NOTE — Medication Information (Signed)
Summary: rov/tm  Anticoagulant Therapy  Managed by: Bethena Midget, RN, BSN Referring MD: Shawnie Pons MD PCP: (Primary Care Dr Dagoberto Ligas ) ( Dentist DRChou) (ENT DR Thayer Ohm Newman)(Eyes DR Pearlean Brownie) (Urologist DR Vonita Moss) (Dermatologist Dr Romeo Apple Turner)(Rheumatologist DR Zacarias Pontes ) ((Orthopedic DR Theron Arista Whitfield)(Allegist DR [place])(Pulmonary Dr Levy Pupa) Supervising MD: Eden Emms MD, Theron Arista Indication 1: Atrial Fibrillation Lab Used: [facility] Site: [facility] INR POC 2.1 INR RANGE 2 - 3  Dietary changes: no    Health status changes: no    Bleeding/hemorrhagic complications: no    Recent/future hospitalizations: no    Any changes in medication regimen? no    Recent/future dental: no  Any missed doses?: no       Is patient compliant with meds? yes       Allergies: 1)  ! Penicillin 2)  ! Sulfa 3)  ! Flagyl 4)  ! Doxycycline Hyclate (Doxycycline Hyclate) 5)  ! Erythromycin 6)  ! Tetracycline 7)  ! * Clindamycin  Anticoagulation Management History:      The patient is taking warfarin and comes in today for a routine follow up visit.  Positive risk factors for bleeding include an age of 75 years or older.  The bleeding index is 'intermediate risk'.  Positive CHADS2 values include Age > 75 years old.  The start date was 08/26/2005.  Anticoagulation responsible provider: Eden Emms MD, Theron Arista.  INR POC: 2.1.  Cuvette Lot#: [id number].  Exp: 04/2011.    Anticoagulation Management Assessment/Plan:      The patient's current anticoagulation dose is Warfarin sodium 1 mg  tabs: use as directed.  The target INR is 2.0-3.0.  The next INR is due 03/24/2010.  Anticoagulation instructions were given to patient.  Results were reviewed/authorized by Bethena Midget, RN, BSN.  He was notified by Bethena Midget, RN, BSN.         Prior Anticoagulation Instructions: INR 2.3  Continue on same dosage 1/2 tablet daily except 1 tablet on Tuesdays, Thursdays, and Saturdays.  Recheck in 4 weeks.     Current Anticoagulation Instructions: INR 2.1 Continue 0.5mg s daily except 1mg s on Tuesdays, Thursdays and Saturdays. Recheck in 4 weeks.

## 2011-01-21 NOTE — Cardiovascular Report (Signed)
Summary: Office Visit   Office Visit   Imported By: Roderic Ovens 10/02/2010 11:43:03  _____________________________________________________________________  External Attachment:    Type:   Image     Comment:   External Document

## 2011-01-21 NOTE — Assessment & Plan Note (Signed)
Summary: f60m   Visit Type:  Follow-up Primary Provider:  (Primary Care Dr Dagoberto Ligas ) ( Dentist DRChou) (ENT DR Thayer Ohm Newman)(Eyes DR Pearlean Brownie) (Urologist DR Vonita Moss) (Dermatologist Dr Romeo Apple Turner)(Rheumatologist DR Zacarias Pontes ) ((Orthopedic DR Theron Arista [place])(Allegist DR [place])(Pulmonary Dr Levy Pupa)  CC:  sinus infection Pt was on levaquin.  History of Present Illness: Overall is doing well.  He is scheduled to have one two removed, the upper most wisdom tooth.  He had pain in the tooth.  INR today is 2.3.  He had a root canal, and oral surgery is scheduled to removed the tooth.  Had recent infection and had treatment by Dr. Ezzard Standing.  No chest pain.  He will get short winded if he walks alot, and has been out of rehab in the interim.   But no chest pain specifically.  Current Medications (verified): 1)  Adult Aspirin Low Strength 81 Mg  Tbdp (Aspirin) .... Take 1 Tablet By Mouth Once A Day 2)  Metoprolol Tartrate 25 Mg  Tabs (Metoprolol Tartrate) .... Take 1 Tablet By Mouth Two Times A Day 3)  Citracal Plus  Tabs (Multiple Minerals-Vitamins) .... Take 1 Tablet Three Times Daily 4)  Warfarin Sodium 1 Mg  Tabs (Warfarin Sodium) .... Use As Directed 5)  Crestor 10 Mg  Tabs (Rosuvastatin Calcium) .... Take 1 Tablet By Mouth Once A Day 6)  Fexofenadine Hcl 180 Mg  Tabs (Fexofenadine Hcl) .... Take 1 Tablet By Mouth Once A Day 7)  Finasteride 5 Mg  Tabs (Finasteride) .... Take 1 Tablet By Mouth Once A Day 8)  Fluticasone Propionate 50 Mcg/act  Susp (Fluticasone Propionate) .... Take 2 Sprays in Each Nostril Once A Day 9)  Terazosin Hcl 5 Mg  Caps (Terazosin Hcl) .... Take 1 Capsule By Mouth Once A Day 10)  Omeprazole 40 Mg Cpdr (Omeprazole) .... Take 1 Capsule Every Day 11)  Lasix 20 Mg  Tabs (Furosemide) .... Take 1 Tablet By Mouth Once A Day 12)  Lyrica 100 Mg  Caps (Pregabalin) .... Take 1 Capsule By Mouth Once A Day. 13)  Alendronate Sodium 70 Mg Tabs (Alendronate Sodium) .... Take 1  Tablet Evey Week  Allergies: 1)  ! Penicillin 2)  ! Sulfa 3)  ! Flagyl 4)  ! Doxycycline Hyclate (Doxycycline Hyclate) 5)  ! Erythromycin 6)  ! Tetracycline 7)  ! * Clindamycin  Past History:  Past Medical History: Last updated: 10/26/2007 Atrial fibrillation Coronary artery disease Hyperlipidemia Allergic rhinitis  Vital Signs:  Patient profile:   77 year old male Height:      70 inches Weight:      205 pounds BMI:     29.52 Pulse rate:   85 / minute BP sitting:   120 / 70  (left arm)  Vitals Entered By: Laurance Flatten CMA (January 05, 2010 10:14 AM)  Physical Exam  General:  Well developed, well nourished, in no acute distress. Head:  normocephalic and atraumatic Lungs:  Clear bilaterally to auscultation and percussion. Heart:  Non displaced PMI.[name] Kitchen  Pos S4.  Normal S1.  Widely split S2.  No def murmur. Abdomen:  Bowel sounds positive; abdomen soft and non-tender without masses, organomegaly, or hernias noted. No hepatosplenomegaly. Extremities:  No clubbing or cyanosis.  Stockings in place.  No edema. Neurologic:  Alert and oriented x 3.  No defects.   EKG  Procedure date:  01/05/2010  Findings:      Atrial tracking, v pacing.  PR prolonged. With magnet, AV pacing.  PPM Specifications Following MD:  [name], MD     PPM Vendor:  [employer]     PPM Model Number:  ADDR01     PPM Serial Number:  [id number] PPM DOI:  08/24/2006     PPM Implanting MD:  [name], MD  Lead 1    Location: [facility]     DOI: 08/24/2006     Model #: [id number]     Serial #: [id number]     Status: active Lead 2    Location: RV     DOI: 08/24/2006     Model #: 6578     Serial #: [id number]     Status: active   Indications:  Tachy-Brady Syndrome   PPM Follow Up Pacer Dependent:  No      Episodes Coumadin:  Yes  Parameters Mode:  DDDR+     Lower Rate Limit:  60     Upper Rate Limit:  130 Paced AV Delay:  150     Sensed AV Delay:  120  Impression & Recommendations:  Problem # 1:   CORONARY ARTERY BYPASS GRAFT, HX OF (ICD-V45.81) stable currently. Orders: EKG w/ Interpretation (93000)  Problem # 2:  ATRIAL FIBRILLATION (ICD-427.31) Currently in sinus.  Pacemaker in place.  They plan to continue coumadin.  Suggested holding tonight and tomorrow, and hold ASA times two days as well.  He will let us know. His updated medication list for this problem includes:    Adult Aspirin Low Strength 81 Mg Tbdp (Aspirin) .[name] Kitchen... Take 1 tablet by mouth once a day    Metoprolol Tartrate 25 Mg Tabs (Metoprolol tartrate) .[name] Kitchen... Take 1 tablet by mouth two times a day    Warfarin Sodium 1 Mg Tabs (Warfarin sodium) ..... Use as directed  Problem # 3:  BRONCHIECTASIS (ICD-494.0) sees Dr. Delton Coombes  Problem # 4:  CARDIAC PACEMAKER IN SITU (ICD-V45.01) Sees Dr. Ladona Ridgel. Orders: EKG w/ Interpretation (93000)  Patient Instructions: 1)  Your physician recommends that you schedule a follow-up appointment in: 2 MONTHS with Dr Riley Kill and 3 WEEKS with the [facility] 2)  Your physician recommends that you continue on your current medications as directed. Please refer to the Current Medication list given to you today.

## 2011-01-21 NOTE — Letter (Signed)
Summary: Patient Discharge Instructions   Patient Discharge Instructions   Imported By: Roderic Ovens 07/07/2010 13:06:32  _____________________________________________________________________  External Attachment:    Type:   Image     Comment:   External Document

## 2011-01-21 NOTE — Medication Information (Signed)
Summary: ccr  Anticoagulant Therapy  Managed by: Cloyde Reams, RN, BSN Referring MD: Shawnie Pons MD PCP: (Primary Care Dr Dagoberto Ligas ) ( Dentist DRChou) (ENT DR Thayer Ohm Newman)(Eyes DR Pearlean Brownie) (Urologist DR Vonita Moss) (Dermatologist Dr Romeo Apple Turner)(Rheumatologist DR Zacarias Pontes ) ((Orthopedic DR Theron Arista [place])(Allegist DR [place])(Pulmonary Dr Levy Pupa) Supervising MD: Riley Kill MD, Maisie Fus Indication 1: Atrial Fibrillation Lab Used: [facility] [place] Site: [facility] INR POC 2.3 INR RANGE 2 - 3  Dietary changes: yes       Details: Incr intake of green vegetables.  Health status changes: yes       Details: Pt reports getting SOB when hurried, denies SOB at rest.  Pt states he feels he is getting weaker, advised to report to primary MD.  Bleeding/hemorrhagic complications: no    Recent/future hospitalizations: no    Any changes in medication regimen? no    Recent/future dental: no  Any missed doses?: no       Is patient compliant with meds? yes       Allergies: 1)  ! Penicillin 2)  ! Sulfa 3)  ! Flagyl 4)  ! Doxycycline Hyclate (Doxycycline Hyclate) 5)  ! Erythromycin 6)  ! Tetracycline 7)  ! * Clindamycin  Anticoagulation Management History:      The patient is taking warfarin and comes in today for a routine follow up visit.  Positive risk factors for bleeding include an age of 8 years or older.  The bleeding index is 'intermediate risk'.  Positive CHADS2 values include Age > 31 years old.  The start date was 08/26/2005.  Anticoagulation responsible provider: Riley Kill MD, Maisie Fus.  INR POC: 2.3.  Cuvette Lot#: [id number].  Exp: 03/2011.    Anticoagulation Management Assessment/Plan:      The patient's current anticoagulation dose is Warfarin sodium 1 mg  tabs: use as directed.  The target INR is 2.0-3.0.  The next INR is due 02/24/2010.  Anticoagulation instructions were given to patient.  Results were reviewed/authorized by Cloyde Reams, RN, BSN.  He was notified by Cloyde Reams RN.         Prior Anticoagulation Instructions: INR 2.3  Continue 0.5 tabs daily except 1 tab each Tuesday, Thursday, Saturday.   Recheck in 3 weeks. The patient is to hold four doses of coumadin.  The dosage to be resumed includes:   Current Anticoagulation Instructions: INR 2.3  Continue on same dosage 1/2 tablet daily except 1 tablet on Tuesdays, Thursdays, and Saturdays.  Recheck in 4 weeks.

## 2011-01-21 NOTE — Medication Information (Signed)
Summary: [name]  Anticoagulant Therapy  Managed by: Weston Brass, PharmD Referring MD: Shawnie Pons MD PCP: (Primary Care Dr Dagoberto Ligas ) ( Dentist DRChou) (ENT DR Thayer Ohm Newman)(Eyes DR Pearlean Brownie) (Urologist DR Vonita Moss) (Dermatologist Dr Romeo Apple Turner)(Rheumatologist DR Zacarias Pontes ) ((Orthopedic DR Theron Arista [place])(Allegist DR [place])(Pulmonary Dr Levy Pupa) Supervising MD: Daleen Squibb MD, Maisie Fus Indication 1: Atrial Fibrillation Lab Used: [facility] [place] Site: [address] INR POC 2.8 INR RANGE 2 - 3  Dietary changes: yes       Details: Less greens than usual   Health status changes: no    Bleeding/hemorrhagic complications: no    Recent/future hospitalizations: no    Any changes in medication regimen? no    Recent/future dental: no  Any missed doses?: no       Is patient compliant with meds? yes       Allergies: 1)  ! Penicillin 2)  ! Sulfa 3)  ! Flagyl 4)  ! Doxycycline Hyclate (Doxycycline Hyclate) 5)  ! Erythromycin 6)  ! Tetracycline 7)  ! * Clindamycin  Anticoagulation Management History:      The patient is taking warfarin and comes in today for a routine follow up visit.  Positive risk factors for bleeding include an age of 75 years or older.  The bleeding index is 'intermediate risk'.  Positive CHADS2 values include Age > 12 years old.  The start date was 08/26/2005.  His last INR was 2.3.  Anticoagulation responsible provider: Daleen Squibb MD, Maisie Fus.  INR POC: 2.8.  Cuvette Lot#: [id number].  Exp: 01/2012.    Anticoagulation Management Assessment/Plan:      The patient's current anticoagulation dose is Warfarin sodium 1 mg  tabs: use as directed.  The target INR is 2.0-3.0.  The next INR is due 02/01/2011.  Anticoagulation instructions were given to patient.  Results were reviewed/authorized by Weston Brass, PharmD.  He was notified by Stephannie Peters, PharmD Candidate .         Prior Anticoagulation Instructions: INR 2.5 The patient is to continue with the  same dose of coumadin.  This dosage includes:  Take 1/2 tablet (0.5mg ) on Mon, Wed, and Fri Take 1 tablet (1mg ) on Sun, Tue, Thu, and Sat. Recheck INR in 4 weeks  Current Anticoagulation Instructions: INR 2.8  Coumadin 1 mg tablets - Continue 1 tablet every day except 1/2 tablet on Mondays, Wednesdays and Fridays

## 2011-01-21 NOTE — Medication Information (Signed)
Summary: [facility]  Anticoagulant Therapy  Managed by: Weston Brass, PharmD Referring MD: Shawnie Pons MD PCP: (Primary Care Dr Dagoberto Ligas ) ( Dentist DRChou) (ENT DR Thayer Ohm Newman)(Eyes DR Pearlean Brownie) (Urologist DR Vonita Moss) (Dermatologist Dr Romeo Apple Turner)(Rheumatologist DR Zacarias Pontes ) ((Orthopedic DR Theron Arista [place])(Allegist DR Eaker George)(Pulmonary Dr Levy Pupa) Supervising MD: Juanda Chance MD, Willetta York Indication 1: Atrial Fibrillation Lab Used: [facility] [place] Site: [address] PT 28.5 INR POC 2.7 INR RANGE 2 - 3  Dietary changes: no    Health status changes: no    Bleeding/hemorrhagic complications: yes       Details: was constipated and had some BRB with bowel movement x 1   Recent/future hospitalizations: no    Any changes in medication regimen? yes       Details: on Avelox for about 5-7 more days  Recent/future dental: no  Any missed doses?: no       Is patient compliant with meds? yes       Allergies: 1)  ! Penicillin 2)  ! Sulfa 3)  ! Flagyl 4)  ! Doxycycline Hyclate (Doxycycline Hyclate) 5)  ! Erythromycin 6)  ! Tetracycline 7)  ! * Clindamycin  Anticoagulation Management History:      His anticoagulation is being managed by telephone today.  Positive risk factors for bleeding include an age of 75 years or older.  The bleeding index is 'intermediate risk'.  Positive CHADS2 values include Age > 65 years old.  The start date was 08/26/2005.  Prothrombin time is 28.5.  Anticoagulation responsible provider: Juanda Chance MD, Smitty Cords.  INR POC: 2.7.    Anticoagulation Management Assessment/Plan:      The patient's current anticoagulation dose is Warfarin sodium 1 mg  tabs: use as directed.  The target INR is 2.0-3.0.  The next INR is due 06/03/2010.  Anticoagulation instructions were given to patient.  Results were reviewed/authorized by Weston Brass, PharmD.  He was notified by Weston Brass PharmD.         Prior Anticoagulation Instructions: INR 2.7  Coumadin 1  mg = 1 tab on Tue, Thur, Sat 1/2 tab = 0.5mg  on Sun, Mon, wed, Fri  Current Anticoagulation Instructions: INR 2.7  Spoke with pt.  Continue same dose of 1/2 tablet every day except 1 tablet on Tuesday, Thursday and Saturday.  Recheck in 1 week. Faxed orders to [facility].

## 2011-01-21 NOTE — Progress Notes (Signed)
Summary: questions about Forteo  Phone Note Call from Patient Call back at [facility] Phone [phone]   Caller: Patient Summary of Call: Pt calling regarding a medication that another MD is putting on called Forteo Initial call taken by: Judie Grieve,  November 05, 2010 10:03 AM  Follow-up for Phone Call        I spoke with the pt and Dr Corliss Skains wants the pt to start Forteo.   The pt said he has been taking Fosamax but his most recent bone density test shows that it is not working.  The pt would like to get Dr Rosalyn Charters thoughts about this medication before taking.  The pt has also contacted Dr Jerelene Redden office about Sharren Bridge.  The pt will be out of town next week and said to leave message on his machine with Dr Rosalyn Charters response. This medication can interact with Warfarin and the pt would need close follow-up of INR if he takes Forteo. Follow-up by: Julieta Gutting, RN, BSN,  November 05, 2010 10:38 AM  Additional Follow-up for Phone Call Additional follow up Details #1::        Dr Riley Kill will discuss with pt at 12/01/10 OV.  Additional Follow-up by: Julieta Gutting, RN, BSN,  December 01, 2010 10:20 AM

## 2011-01-21 NOTE — Progress Notes (Signed)
Summary: refill  Phone Note Refill Request Message from:  Patient on March 09, 2010 8:23 AM  Refills Requested: Medication #1:  METOPROLOL TARTRATE 25 MG  TABS Take 1 tablet by mouth two times a day Send to [facility]  [phone] pt needs two weeks worth. And pt needs  a new prescription  sent [facility]  Initial call taken by: Judie Grieve,  March 09, 2010 8:24 AM  Follow-up for Phone Call        [facility]ed [facility] with 3 refills. Rx called to [facility] for 2 months supply. Follow-up by: Vikki Ports,  March 09, 2010 9:13 AM    Prescriptions: METOPROLOL TARTRATE 25 MG  TABS (METOPROLOL TARTRATE) Take 1 tablet by mouth two times a day  #[facility] x 3   Entered by:   Vikki Ports   Authorized by:   Ronaldo Miyamoto, MD, [facility]   Signed by:   Vikki Ports on 03/09/2010   Method used:   Faxed to ...       [facility]order[facility]             , [place]         Ph:        Fax: [phone]   RxID:   [id number]

## 2011-01-21 NOTE — Letter (Signed)
Summary: [facility] Med[facility] Office Note  [facility] Office Note   Imported By: Roderic Ovens 06/10/2010 15:53:28  _____________________________________________________________________  External Attachment:    Type:   Image     Comment:   External Document

## 2011-01-21 NOTE — Medication Information (Signed)
Summary: rov/cs  Anticoagulant Therapy  Managed by: Weston Brass, PharmD Referring MD: Shawnie Pons MD PCP: (Primary Care Dr Dagoberto Ligas ) ( Dentist DRChou) (ENT DR Thayer Ohm Newman)(Eyes DR Pearlean Brownie) (Urologist DR Vonita Moss) (Dermatologist Dr Romeo Apple Turner)(Rheumatologist DR Zacarias Pontes ) ((Orthopedic DR Theron Arista Whitfield)(Allegist DR [place])(Pulmonary Dr Levy Pupa) Supervising MD: Graciela Husbands MD, Viviann Spare Indication 1: Atrial Fibrillation Lab Used: [facility] [place] Site: [address] INR POC 2.5 INR RANGE 2 - 3  Dietary changes: no    Health status changes: yes       Details: feeling cold with Coumadin  Bleeding/hemorrhagic complications: no    Recent/future hospitalizations: no    Any changes in medication regimen? no    Recent/future dental: no  Any missed doses?: no       Is patient compliant with meds? yes       Allergies: 1)  ! Penicillin 2)  ! Sulfa 3)  ! Flagyl 4)  ! Doxycycline Hyclate (Doxycycline Hyclate) 5)  ! Erythromycin 6)  ! Tetracycline 7)  ! * Clindamycin  Anticoagulation Management History:      The patient is taking warfarin and comes in today for a routine follow up visit.  Positive risk factors for bleeding include an age of 75 years or older.  The bleeding index is 'intermediate risk'.  Positive CHADS2 values include Age > 82 years old.  The start date was 08/26/2005.  His last INR was 2.3.  Anticoagulation responsible provider: Graciela Husbands MD, Viviann Spare.  INR POC: 2.5.  Cuvette Lot#: [id number].  Exp: 11/2011.    Anticoagulation Management Assessment/Plan:      The patient's current anticoagulation dose is Warfarin sodium 1 mg  tabs: use as directed.  The target INR is 2.0-3.0.  The next INR is due 11/24/2010.  Anticoagulation instructions were given to patient.  Results were reviewed/authorized by Weston Brass, PharmD.  He was notified by Weston Brass PharmD.         Prior Anticoagulation Instructions: INR 2.3  Continue Coumadin as scheduled:  1 tablet every  day of the week, except 1/2 tablet on Monday, Wednesday, and Friday.   Return to clinic in 4 weeks.    Current Anticoagulation Instructions: INR 2.5  Continue same dose of 1 tablet every day except 1/2 tablet on Monday, Wednesday and Friday.   Recheck INR in 4 weeks.

## 2011-01-21 NOTE — Medication Information (Signed)
Summary: rov/ewj  Anticoagulant Therapy  Managed by: Shelby Dubin, PharmD, BCPS, CPP Referring MD: Shawnie Pons MD PCP: (Primary Care Dr Dagoberto Ligas ) ( Dentist DRChou) (ENT DR Thayer Ohm Newman)(Eyes DR Pearlean Brownie) (Urologist DR Vonita Moss) (Dermatologist Dr Romeo Apple Turner)(Rheumatologist DR Zacarias Pontes ) ((Orthopedic DR Theron Arista Whitfield)(Allegist DR [place])(Pulmonary Dr Levy Pupa) Supervising MD: Jens Som MD, Arlys John Indication 1: Atrial Fibrillation Lab Used: [facility] Site: [employer] INR POC 2.3 INR RANGE 2 - 3  Dietary changes: no    Health status changes: no    Bleeding/hemorrhagic complications: no    Recent/future hospitalizations: no    Any changes in medication regimen? yes       Details: TS appt today  Recent/future dental: yes     Details: tooth extraction in AM tomorrow..Result called to Dr. Inocencio Homes per pt request ([phone])  Any missed doses?: no       Is patient compliant with meds? yes       Current Medications (verified): 1)  Adult Aspirin Low Strength 81 Mg  Tbdp (Aspirin) .... Take 1 Tablet By Mouth Once A Day 2)  Metoprolol Tartrate 25 Mg  Tabs (Metoprolol Tartrate) .... Take 1 Tablet By Mouth Two Times A Day 3)  Citracal Plus  Tabs (Multiple Minerals-Vitamins) .... Take 1 Tablet Three Times Daily 4)  Warfarin Sodium 1 Mg  Tabs (Warfarin Sodium) .... Use As Directed 5)  Crestor 10 Mg  Tabs (Rosuvastatin Calcium) .... Take 1 Tablet By Mouth Once A Day 6)  Fexofenadine Hcl 180 Mg  Tabs (Fexofenadine Hcl) .... Take 1 Tablet By Mouth Once A Day 7)  Finasteride 5 Mg  Tabs (Finasteride) .... Take 1 Tablet By Mouth Once A Day 8)  Fluticasone Propionate 50 Mcg/act  Susp (Fluticasone Propionate) .... Take 2 Sprays in Each Nostril Once A Day 9)  Terazosin Hcl 5 Mg  Caps (Terazosin Hcl) .... Take 1 Capsule By Mouth Once A Day 10)  Omeprazole 40 Mg Cpdr (Omeprazole) .... Take 1 Capsule Every Day 11)  Lasix 20 Mg  Tabs (Furosemide) .... Take 1 Tablet By Mouth Once A Day 12)   Lyrica 100 Mg  Caps (Pregabalin) .... Take 1 Capsule By Mouth Once A Day. 13)  Alendronate Sodium 70 Mg Tabs (Alendronate Sodium) .... Take 1 Tablet Evey Week  Allergies (verified): 1)  ! Penicillin 2)  ! Sulfa 3)  ! Flagyl 4)  ! Doxycycline Hyclate (Doxycycline Hyclate) 5)  ! Erythromycin 6)  ! Tetracycline 7)  ! * Clindamycin  Anticoagulation Management History:      The patient is taking warfarin and comes in today for a routine follow up visit.  Positive risk factors for bleeding include an age of 75 years or older.  The bleeding index is 'intermediate risk'.  Positive CHADS2 values include Age > 80 years old.  The start date was 08/26/2005.  Anticoagulation responsible provider: Jens Som MD, Arlys John.  INR POC: 2.3.  Cuvette Lot#: [id number].  Exp: 01/2011.    Anticoagulation Management Assessment/Plan:      The patient's current anticoagulation dose is Warfarin sodium 1 mg  tabs: use as directed.  The target INR is 2.0-3.0.  The next INR is due 01/26/2010.  Anticoagulation instructions were given to patient.  Results were reviewed/authorized by Shelby Dubin, PharmD, BCPS, CPP.  He was notified by Shelby Dubin PharmD, BCPS, CPP.         Prior Anticoagulation Instructions: INR 3.1 Today take 0.5mg s then resume 0.5mg s everyday except 1mg s on Tuesdays, Thursdays, and Saturdays. Pt. will  call dentist and discuss INR from today and r/s extraction will call us with new date.   Current Anticoagulation Instructions: INR 2.3  Continue 0.5 tabs daily except 1 tab each Tuesday, Thursday, Saturday.   Recheck in 3 weeks. The patient is to hold four doses of coumadin.  The dosage to be resumed includes:

## 2011-01-21 NOTE — Progress Notes (Signed)
Summary: question on meds  Phone Note Call from Patient Call back at Home Phone [phone]   Caller: Patient Reason for Call: Talk to Nurse Summary of Call: pt has question on meds.  Initial call taken by: Roe Coombs,  December 09, 2010 3:13 PM  Follow-up for Phone Call        The pt called because Dr Dagoberto Ligas wrote a Rx Androgel (generic Testosterone gel). The pt has read The Pill Book and PDR and is concerned about this medication. The pt said he read that this can  increase cholesterol levels and can effect warfarin.  The pt has not picked up this medication due to cost at this time.  The pt would like to know if this is a medication he should consider taking or not take due to interactions.  The pt is followed in our coumadin clinic.  I will forward this message to Weston Brass Pharm-D to review and contact the pt.   Follow-up by: Julieta Gutting, RN, BSN,  December 09, 2010 3:48 PM  Additional Follow-up for Phone Call Additional follow up Details #1::        [facility]  for pt. Weston Brass PharmD  December 10, 2010 11:40 AM   Spoke with pt.  Explained testosterone is a building block of cholesterol so may experience some changes in cholesterol but usually not clinically significant.  Testosterone may increase INR but will continue to monitor and make adjustments as needed.  Weston Brass PharmD  December 11, 2010 8:44 AM

## 2011-01-21 NOTE — Medication Information (Signed)
Summary: rov/sp  Anticoagulant Therapy  Managed by: Weston Brass, PharmD Referring MD: Shawnie Pons MD PCP: (Primary Care Dr Dagoberto Ligas ) ( Dentist DRChou) (ENT DR Thayer Ohm Newman)(Eyes DR Pearlean Brownie) (Urologist DR Vonita Moss) (Dermatologist Dr Romeo Apple Turner)(Rheumatologist DR Zacarias Pontes ) ((Orthopedic DR Theron Arista Whitfield)(Allegist DR [place])(Pulmonary Dr Levy Pupa) Supervising MD: Eden Emms MD, Theron Arista Indication 1: Atrial Fibrillation Lab Used: [facility] [place] [facility]  Site: [address] INR POC 1.8 INR RANGE 2 - 3  Dietary changes: no    Health status changes: no    Bleeding/hemorrhagic complications: no    Recent/future hospitalizations: no    Any changes in medication regimen? no    Recent/future dental: no  Any missed doses?: no       Is patient compliant with meds? yes       Allergies: 1)  ! Penicillin 2)  ! Sulfa 3)  ! Flagyl 4)  ! Doxycycline Hyclate (Doxycycline Hyclate) 5)  ! Erythromycin 6)  ! Tetracycline 7)  ! * Clindamycin  Anticoagulation Management History:      The patient is taking warfarin and comes in today for a routine follow up visit.  Positive risk factors for bleeding include an age of 75 years or older.  The bleeding index is 'intermediate risk'.  Positive CHADS2 values include Age > 62 years old.  The start date was 08/26/2005.  Anticoagulation responsible provider: Eden Emms MD, Theron Arista.  INR POC: 1.8.  Cuvette Lot#: [id number].  Exp: 04/2011.    Anticoagulation Management Assessment/Plan:      The patient's current anticoagulation dose is Warfarin sodium 1 mg  tabs: use as directed.  The target INR is 2.0-3.0.  The next INR is due 07/17/2010.  Anticoagulation instructions were given to patient.  Results were reviewed/authorized by Weston Brass, PharmD.  He was notified by Dillard Cannon.         Prior Anticoagulation Instructions: INR 1.81  LMOM for pt. Weston Brass PharmD  June 19, 2010 4:02 PM   Spoke with pt on 7/1.  Instructed pt to take 1 tablet on  Friday then continue same dose of 1/2 tablet every day except 1 tablet on Tuesday, Thursday, and Saturday.  Recheck INR in 2 weeks.   Current Anticoagulation Instructions: INR 1.8  Take 1 tab today, then change to 1/2 tab on Monday, Wednesday, and Friday and 1 tab all other days.  Re-check INR in 2 weeks.

## 2011-01-21 NOTE — Medication Information (Signed)
Summary: Coumadin Clinic  Anticoagulant Therapy  Managed by: Weston Brass, PharmD Referring MD: Shawnie Pons MD PCP: (Primary Care Dr Dagoberto Ligas ) ( Dentist DRChou) (ENT DR Thayer Ohm Newman)(Eyes DR Pearlean Brownie) (Urologist DR Vonita Moss) (Dermatologist Dr Romeo Apple Turner)(Rheumatologist DR Zacarias Pontes ) ((Orthopedic DR Theron Arista [place]field)(Allegist DR [place])(Pulmonary Dr Levy Pupa) Supervising MD: Juanda Chance MD, Hadassah Rana Indication 1: Atrial Fibrillation Lab Used: [facility] [place] Site: [address] PT 20.8 INR POC 1.81 INR RANGE 2 - 3  Dietary changes: no    Health status changes: no    Bleeding/hemorrhagic complications: no    Recent/future hospitalizations: no    Any changes in medication regimen? no    Recent/future dental: no  Any missed doses?: no       Is patient compliant with meds? yes       Allergies: 1)  ! Penicillin 2)  ! Sulfa 3)  ! Flagyl 4)  ! Doxycycline Hyclate (Doxycycline Hyclate) 5)  ! Erythromycin 6)  ! Tetracycline 7)  ! * Clindamycin  Anticoagulation Management History:      His anticoagulation is being managed by telephone today.  Positive risk factors for bleeding include an age of 49 years or older.  The bleeding index is 'intermediate risk'.  Positive CHADS2 values include Age > 71 years old.  The start date was 08/26/2005.  Prothrombin time is 20.8.  Anticoagulation responsible provider: Juanda Chance MD, Smitty Cords.  INR POC: 1.81.  Exp: 04/2011.    Anticoagulation Management Assessment/Plan:      The patient's current anticoagulation dose is Warfarin sodium 1 mg  tabs: use as directed.  The target INR is 2.0-3.0.  The next INR is due 07/03/2010.  Anticoagulation instructions were given to patient.  Results were reviewed/authorized by Weston Brass, PharmD.  He was notified by Weston Brass PharmD.         Prior Anticoagulation Instructions: INR 2.54 Continue 0.5mg s daily except 1mg s on TT&Sat. Recheck in 2 weeks.  Orders given to [facility])   Current Anticoagulation Instructions: INR 1.81  LMOM for pt. Weston Brass PharmD  June 19, 2010 4:02 PM   Spoke with pt on 7/1.  Instructed pt to take 1 tablet on Friday then continue same dose of 1/2 tablet every day except 1 tablet on Tuesday, Thursday, and Saturday.  Recheck INR in 2 weeks.

## 2011-01-21 NOTE — Medication Information (Signed)
Summary: rov/tm  Anticoagulant Therapy  Managed by: Eda Keys, PharmD Referring MD: Shawnie Pons MD PCP: (Primary Care Dr Dagoberto Ligas ) ( Dentist DRChou) (ENT DR Thayer Ohm Newman)(Eyes DR Pearlean Brownie) (Urologist DR Vonita Moss) (Dermatologist Dr Romeo Apple Turner)(Rheumatologist DR Zacarias Pontes ) ((Orthopedic DR Theron Arista Whitfield)(Allegist DR [place])(Pulmonary Dr Levy Pupa) Supervising MD: Gala Romney MD, Reuel Boom Indication 1: Atrial Fibrillation Lab Used: [facility] [place] Site: [address] INR POC 2.3 INR RANGE 2 - 3  Dietary changes: no    Health status changes: no    Bleeding/hemorrhagic complications: no    Recent/future hospitalizations: no    Any changes in medication regimen? no    Recent/future dental: no  Any missed doses?: yes     Details: one dose, about 2 weeks ago  Is patient compliant with meds? yes       Allergies: 1)  ! Penicillin 2)  ! Sulfa 3)  ! Flagyl 4)  ! Doxycycline Hyclate (Doxycycline Hyclate) 5)  ! Erythromycin 6)  ! Tetracycline 7)  ! * Clindamycin  Anticoagulation Management History:      The patient is taking warfarin and comes in today for a routine follow up visit.  Positive risk factors for bleeding include an age of 75 years or older.  The bleeding index is 'intermediate risk'.  Positive CHADS2 values include Age > 75 years old.  The start date was 08/26/2005.  Anticoagulation responsible Jun Rightmyer: Bensimhon MD, Reuel Boom.  INR POC: 2.3.  Cuvette Lot#: [id number].  Exp: 09/2011.    Anticoagulation Management Assessment/Plan:      The patient's current anticoagulation dose is Warfarin sodium 1 mg  tabs: use as directed.  The target INR is 2.0-3.0.  The next INR is due 10/01/2010.  Anticoagulation instructions were given to patient.  Results were reviewed/authorized by Eda Keys, PharmD.  He was notified by Eda Keys.         Prior Anticoagulation Instructions: INR 2.2 Continue 1mg s everyday except 0.5mg s on Mondays, Wednesdays  and Fridays. Recheck in 4 weeks  Current Anticoagulation Instructions: INR 2.3  Continue taking 1/2 tablet on Monday, Wednesday, and Friday and 1 tablet all other days.  Return to clinic in 4 weeks.

## 2011-01-21 NOTE — Letter (Signed)
Summary: [facility] Orders  [facility] Orders   Imported By: Debby Freiberg 06/17/2010 14:42:24  _____________________________________________________________________  External Attachment:    Type:   Image     Comment:   External Document

## 2011-01-21 NOTE — Medication Information (Signed)
Summary: rov/tm  Anticoagulant Therapy  Managed by: Bethena Midget, RN, BSN Referring MD: Shawnie Pons MD PCP: (Primary Care Dr Dagoberto Ligas ) ( Dentist DRChou) (ENT DR Thayer Ohm Newman)(Eyes DR Pearlean Brownie) (Urologist DR Vonita Moss) (Dermatologist Dr Romeo Apple Turner)(Rheumatologist DR Zacarias Pontes ) ((Orthopedic DR Theron Arista Whitfield)(Allegist DR [place])(Pulmonary Dr Levy Pupa) Supervising MD: Gala Romney MD, Reuel Boom Indication 1: Atrial Fibrillation Lab Used: [facility] [place] Site: [address] INR POC 2.2 INR RANGE 2 - 3  Dietary changes: no    Health status changes: no    Bleeding/hemorrhagic complications: no    Recent/future hospitalizations: no    Any changes in medication regimen? no    Recent/future dental: no  Any missed doses?: no       Is patient compliant with meds? yes       Allergies: 1)  ! Penicillin 2)  ! Sulfa 3)  ! Flagyl 4)  ! Doxycycline Hyclate (Doxycycline Hyclate) 5)  ! Erythromycin 6)  ! Tetracycline 7)  ! * Clindamycin  Anticoagulation Management History:      The patient is taking warfarin and comes in today for a routine follow up visit.  Positive risk factors for bleeding include an age of 75 years or older.  The bleeding index is 'intermediate risk'.  Positive CHADS2 values include Age > 31 years old.  The start date was 08/26/2005.  Anticoagulation responsible provider: Bensimhon MD, Reuel Boom.  INR POC: 2.2.  Cuvette Lot#: [id number].  Exp: 09/2011.    Anticoagulation Management Assessment/Plan:      The patient's current anticoagulation dose is Warfarin sodium 1 mg  tabs: use as directed.  The target INR is 2.0-3.0.  The next INR is due 09/03/2010.  Anticoagulation instructions were given to patient.  Results were reviewed/authorized by Bethena Midget, RN, BSN.  He was notified by Bethena Midget, RN, BSN.         Prior Anticoagulation Instructions: INR 2.2 Continue 1mg s daily except 0.5mg s on Mondays, Wednesdays and Fridays. Recheck in 3 weeks.    Current Anticoagulation Instructions: INR 2.2 Continue 1mg s everyday except 0.5mg s on Mondays, Wednesdays and Fridays. Recheck in 4 weeks

## 2011-01-21 NOTE — Assessment & Plan Note (Signed)
Summary: [facility]/SAF   Visit Type:  Follow-up Primary Provider:  (Primary Care Dr Dagoberto Ligas ) ( Dentist DRChou) (ENT DR Thayer Ohm Newman)(Eyes DR Pearlean Brownie) (Urologist DR Vonita Moss) (Dermatologist Dr Romeo Apple Turner)(Rheumatologist DR Zacarias Pontes ) ((Orthopedic DR Theron Arista Whitfield)(Allegist DR [place])(Pulmonary Dr Levy Pupa)   History of Present Illness: [name] returns today for followup.  He had been hospitalized several weeks ago but is improved.  He has a h/o CAD, HTN and bradycardia.  He is s/p PPM.  He denies c/p, sob or peripheral edema.  No syncope.  No other complaints.   Current Medications (verified): 1)  Adult Aspirin Low Strength 81 Mg  Tbdp (Aspirin) .... Take 1 Tablet By Mouth Once A Day 2)  Metoprolol Tartrate 25 Mg  Tabs (Metoprolol Tartrate) .... Take 1 Tablet By Mouth Two Times A Day 3)  Citracal Plus  Tabs (Multiple Minerals-Vitamins) .... Take 1 Tablet Three Times Daily 4)  Warfarin Sodium 1 Mg  Tabs (Warfarin Sodium) .... Use As Directed 5)  Crestor 10 Mg  Tabs (Rosuvastatin Calcium) .... Take 1 Tablet By Mouth Once A Day 6)  Zyrtec Hives Relief 10 Mg Tabs (Cetirizine Hcl) .... Once Daily 7)  Finasteride 5 Mg  Tabs (Finasteride) .... Take 1 Tablet By Mouth Once A Day 8)  Fluticasone Propionate 50 Mcg/act  Susp (Fluticasone Propionate) .... Take 2 Sprays in Each Nostril Once A Day 9)  Terazosin Hcl 5 Mg  Caps (Terazosin Hcl) .... Take 1 Capsule By Mouth Once A Day 10)  Omeprazole 40 Mg Cpdr (Omeprazole) .... Take 1 Capsule Every Day 11)  Lyrica 75 Mg Caps (Pregabalin) .... Take 1 Tablet By Mouth Once A Day 12)  Alendronate Sodium 70 Mg Tabs (Alendronate Sodium) .... Take 1 Tablet Evey Week 13)  Ambien 10 Mg Tabs (Zolpidem Tartrate) .... Take 1/2 Tab Qhs  Allergies (verified): 1)  ! Penicillin 2)  ! Sulfa 3)  ! Flagyl 4)  ! Doxycycline Hyclate (Doxycycline Hyclate) 5)  ! Erythromycin 6)  ! Tetracycline 7)  ! 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 Head:  normocephalic and atraumatic Eyes:  PERRLA/EOM intact; conjunctiva and lids normal. Mouth:  Teeth, gums and palate normal. Oral mucosa normal. Neck:  NO bruits Chest Wall:  Well healed PM incision. Lungs:  slight decrease breath sounds in bases.  Heart:  PMI non displaced.  Normal S1 and S2 with widely split S2.  No DM. Abdomen:  Bowel sounds positive; abdomen soft and non-tender without masses, organomegaly, or hernias noted. No hepatosplenomegaly. Msk:  Back normal, normal gait. Muscle strength and tone normal. Pulses:  pulses normal in all 4 extremities Extremities:  No clubbing or cyanosis. Neurologic:  Alert and oriented x 3.   PPM Specifications Following MD:  [name], MD  PPM Vendor:  [employer]     PPM Model Number:  [id number]     PPM Serial Number:  [id number] PPM DOI:  08/24/2006     PPM Implanting MD:  [name], MD  Lead 1    Location: RA     DOI: 08/24/2006     Model #: [id number]     Serial #: [id number]     Status: active Lead 2    Location: RV     DOI: 08/24/2006     Model #: [id number]     Serial #: [id number]     Status: active   Indications:  Tachy-Brady Syndrome   PPM Follow Up Battery Voltage:  2.76 V     Battery Est.  Longevity:  4 yrs     Pacer Dependent:  No       PPM Device Measurements Atrium  Amplitude: 1.40 mV, Impedance: 548 ohms,  Right Ventricle  Amplitude: 15.68 mV, Impedance: 473 ohms, Threshold: 0.750 V at 0.40 msec  Episodes MS Episodes:  53     Percent Mode Switch:  49.5%     Coumadin:  Yes Ventricular High Rate:  0     Atrial Pacing:  59%     Ventricular Pacing:  7.8%  Parameters Mode:  MVP     Lower Rate Limit:  60     Upper Rate Limit:  130 Paced AV Delay:  150     Sensed AV Delay:  120 Next Remote Date:  12/31/2010     Next Cardiology Appt Due:  09/20/2011 Tech Comments:  53 MODE SWITCHES-49.5% OF TIME IN AF.  + COUMADIN.  NORMAL DEVICE FUNCTION.  CHANGED RA OUTPUT FROM 1.625 TO 2.0 AND RV OUTPUT FROM 2.00 TO 2.5 V.  CARELINK TRANSMISSION 12-31-10 AND ROV IN 12 MTHS W/GT. Vella Kohler  September 29, 2010 4:00 PM MD Comments:  Agree with above.  Impression & Recommendations:  Problem # 1:  CARDIAC PACEMAKER IN SITU (ICD-V45.01) His device is working [employer].  Will recheck in several months.  Problem # 2:  ATRIAL FIBRILLATION (ICD-427.31) He is out of rhythm about half the time.  His rate appears to be well controlled. He is asymptomatic. His updated medication list for this problem includes:    Adult Aspirin Low Strength 81 Mg Tbdp (Aspirin) .[name] Kitchen... Take 1 tablet by mouth once a day    Metoprolol Tartrate 25 Mg Tabs (Metoprolol tartrate) .[name] Kitchen... Take 1 tablet by mouth two times a day    Warfarin Sodium 1 Mg Tabs (Warfarin sodium) ..... Use as directed  Problem # 3:  CAD, ARTERY BYPASS GRAFT (ICD-414.04) He denies anginal symptoms.  He will continue his meds as below. His updated medication list for this problem includes:    Adult Aspirin Low Strength 81 Mg Tbdp (Aspirin) .[name] Kitchen... Take 1 tablet by mouth once a day    Metoprolol Tartrate 25 Mg Tabs (Metoprolol tartrate) .[name] Kitchen... Take 1 tablet by mouth two times a day    Warfarin Sodium 1 Mg Tabs (Warfarin sodium) ..... Use as  directed  Patient Instructions: 1)  Your physician wants you to follow-up in:12 months with Dr Court Joy will receive a reminder letter in the mail two months in advance. If you don't receive a letter, please call our office to schedule the follow-up appointment. 2)  [facility] 12/31/10

## 2011-01-22 ENCOUNTER — Encounter (HOSPITAL_COMMUNITY): Payer: Medicare Other

## 2011-01-22 ENCOUNTER — Telehealth: Payer: Self-pay | Admitting: Cardiology

## 2011-01-25 ENCOUNTER — Encounter (HOSPITAL_COMMUNITY): Payer: Medicare Other

## 2011-01-27 ENCOUNTER — Encounter (HOSPITAL_COMMUNITY): Payer: Medicare Other

## 2011-01-29 ENCOUNTER — Encounter (HOSPITAL_COMMUNITY): Payer: Medicare Other

## 2011-01-31 ENCOUNTER — Encounter (INDEPENDENT_AMBULATORY_CARE_PROVIDER_SITE_OTHER): Payer: Self-pay | Admitting: *Deleted

## 2011-02-01 ENCOUNTER — Encounter (INDEPENDENT_AMBULATORY_CARE_PROVIDER_SITE_OTHER): Payer: Medicare Other

## 2011-02-01 ENCOUNTER — Encounter: Payer: Self-pay | Admitting: Cardiology

## 2011-02-01 ENCOUNTER — Encounter (HOSPITAL_COMMUNITY): Payer: Medicare Other

## 2011-02-01 DIAGNOSIS — Z7901 Long term (current) use of anticoagulants: Secondary | ICD-10-CM

## 2011-02-01 DIAGNOSIS — I4891 Unspecified atrial fibrillation: Secondary | ICD-10-CM

## 2011-02-03 ENCOUNTER — Encounter (HOSPITAL_COMMUNITY): Payer: Medicare Other

## 2011-02-04 NOTE — Progress Notes (Signed)
Summary:  new pcp  Phone Note Call from Patient   Caller: Patient Reason for Call: Talk to Nurse Summary of Call: pt needs a new pcp. pt wants dr. Riley Kill to recommend a pcp.  Initial call taken by: Roe Coombs,  January 22, 2011 11:12 AM  Follow-up for Phone Call        I will forward message to Dr Riley Kill for review. Julieta Gutting, RN, BSN  January 22, 2011 11:17 AM  Additional Follow-up for Phone Call Additional follow up Details #1::        pt calling back re pcp dr. pt wants lauren or dr Riley Kill to let him know what dr to go to Additional Follow-up by: Roe Coombs,  January 26, 2011 11:56 AM    Additional Follow-up for Phone Call Additional follow up Details #2::    I spoke with the pt and made him aware that Dr Riley Kill recommends any [place] PCP.  Number given to [facility] location.  Follow-up by: Julieta Gutting, RN, BSN,  January 27, 2011 1:15 PM

## 2011-02-05 ENCOUNTER — Encounter (HOSPITAL_COMMUNITY): Payer: Medicare Other

## 2011-02-08 ENCOUNTER — Encounter (HOSPITAL_COMMUNITY): Payer: Medicare Other

## 2011-02-08 DIAGNOSIS — Z7901 Long term (current) use of anticoagulants: Secondary | ICD-10-CM | POA: Insufficient documentation

## 2011-02-08 DIAGNOSIS — I4891 Unspecified atrial fibrillation: Secondary | ICD-10-CM

## 2011-02-10 ENCOUNTER — Encounter (HOSPITAL_COMMUNITY): Payer: Medicare Other

## 2011-02-10 ENCOUNTER — Encounter: Payer: Self-pay | Admitting: Internal Medicine

## 2011-02-10 NOTE — Medication Information (Signed)
Summary: [facility]  Anticoagulant Therapy  Managed by: Weston Brass, PharmD Referring MD: Shawnie Pons MD PCP: (Primary Care Dr Dagoberto Ligas ) ( Dentist DRChou) (ENT DR Thayer Ohm Newman)(Eyes DR Pearlean Brownie) (Urologist DR Vonita Moss) (Dermatologist Dr Romeo Apple Turner)(Rheumatologist DR Zacarias Pontes ) ((Orthopedic DR Theron Arista Whitfield)(Allegist DR [place])(Pulmonary Dr Levy Pupa) Supervising MD: Jens Som MD, Arlys John Indication 1: Atrial Fibrillation Lab Used: [facility] [place] Site: [address] INR POC 2.2 INR RANGE 2 - 3  Dietary changes: no    Health status changes: yes       Details: See details of fall 2 weeks ago below.   Bleeding/hemorrhagic complications: no    Recent/future hospitalizations: no    Any changes in medication regimen? yes       Details: Pt. had sinus infection about the 3rd week in January and was put on a 10day course of Levaquin 500mg  daily.  (no longer taking this medication)  Recent/future dental: no  Any missed doses?: no       Is patient compliant with meds? yes      Comments: Larey Seat 2 weeks ago.  The event started when his wife fell 2 weeks ago and her head hit the cabinet and she hit the floor.  Then he panicked and caught his foot in a chair and ended up on the floor too.  He mainly fell on his backside and ended up with a nerve issue.  MD did a nerve conduction study and he has not heard back the results yet.  He does not have a lot mobility in his right leg since the fall.    Allergies: 1)  ! Penicillin 2)  ! Sulfa 3)  ! Flagyl 4)  ! Doxycycline Hyclate (Doxycycline Hyclate) 5)  ! Erythromycin 6)  ! Tetracycline 7)  ! * Clindamycin  Anticoagulation Management History:      The patient is taking warfarin and comes in today for a routine follow up visit.  Positive risk factors for bleeding include an age of 75 years or older.  The bleeding index is 'intermediate risk'.  Positive CHADS2 values include Age > 87 years old.  The start date was  08/26/2005.  His last INR was 2.3.  Anticoagulation responsible provider: Jens Som MD, Arlys John.  INR POC: 2.2.  Cuvette Lot#: [id number].  Exp: 12/2011.    Anticoagulation Management Assessment/Plan:      The patient's current anticoagulation dose is Warfarin sodium 1 mg  tabs: use as directed.  The target INR is 2.0-3.0.  The next INR is due 03/01/2011.  Anticoagulation instructions were given to patient.  Results were reviewed/authorized by Weston Brass, PharmD.  He was notified by Margot Chimes PharmD Candidate.         Prior Anticoagulation Instructions: INR 2.8  Coumadin 1 mg tablets - Continue 1 tablet every day except 1/2 tablet on Mondays, Wednesdays and Fridays   Current Anticoagulation Instructions: INR 2.2   Continue your current dose of 1 tablet everyday except 1/2 tablet on Mondays, Wednesdays, and Fridays.  Recheck INR in 4 weeks.

## 2011-02-10 NOTE — Letter (Signed)
Summary: Remote Device Check  [employer], Main Office  [address]   [place], [place] [address]   Phone: [phone]  Fax: [phone]     January 31, 2011 MRN: [id number]   [facility] [address] [place], [place]  [address]   Dear Mr. KLEINPETER,   Your remote transmission was recieved and reviewed by your physician.  All diagnostics were within normal limits for you.  __X___Your next transmission is scheduled for:   04-01-2011.  Please transmit at any time this day.  If you have a wireless device your transmission will be sent automatically.   Sincerely,  Vella Kohler

## 2011-02-10 NOTE — Cardiovascular Report (Signed)
Summary: Office Visit Remote   Office Visit Remote   Imported By: Roderic Ovens 02/02/2011 15:10:34  _____________________________________________________________________  External Attachment:    Type:   Image     Comment:   External Document

## 2011-02-12 ENCOUNTER — Encounter (HOSPITAL_COMMUNITY): Payer: Medicare Other

## 2011-02-15 ENCOUNTER — Encounter (HOSPITAL_COMMUNITY): Payer: Medicare Other

## 2011-02-17 ENCOUNTER — Encounter: Payer: Self-pay | Admitting: Internal Medicine

## 2011-02-17 ENCOUNTER — Ambulatory Visit (INDEPENDENT_AMBULATORY_CARE_PROVIDER_SITE_OTHER): Payer: Medicare Other | Admitting: Internal Medicine

## 2011-02-17 ENCOUNTER — Encounter (HOSPITAL_COMMUNITY): Payer: Medicare Other

## 2011-02-17 DIAGNOSIS — I4891 Unspecified atrial fibrillation: Secondary | ICD-10-CM

## 2011-02-19 ENCOUNTER — Encounter (HOSPITAL_COMMUNITY): Payer: Self-pay

## 2011-02-22 ENCOUNTER — Encounter: Payer: Self-pay | Admitting: Nurse Practitioner

## 2011-02-22 ENCOUNTER — Encounter (HOSPITAL_COMMUNITY): Payer: Self-pay

## 2011-02-22 ENCOUNTER — Encounter: Payer: Medicare Other | Admitting: Physician Assistant

## 2011-02-22 ENCOUNTER — Encounter (INDEPENDENT_AMBULATORY_CARE_PROVIDER_SITE_OTHER): Payer: Medicare Other | Admitting: Nurse Practitioner

## 2011-02-22 DIAGNOSIS — K4090 Unilateral inguinal hernia, without obstruction or gangrene, not specified as recurrent: Secondary | ICD-10-CM | POA: Insufficient documentation

## 2011-02-22 DIAGNOSIS — I4891 Unspecified atrial fibrillation: Secondary | ICD-10-CM

## 2011-02-22 DIAGNOSIS — I2510 Atherosclerotic heart disease of native coronary artery without angina pectoris: Secondary | ICD-10-CM

## 2011-02-23 ENCOUNTER — Telehealth: Payer: Self-pay | Admitting: Cardiology

## 2011-02-24 ENCOUNTER — Encounter (HOSPITAL_COMMUNITY): Payer: Self-pay

## 2011-02-25 NOTE — Assessment & Plan Note (Signed)
Summary: NEW MEDICARE PT   #   PKG   STC   Vital Signs:  Patient profile:   77 year old male Height:      70 inches Weight:      160 pounds O2 Sat:      98 % on Room air Temp:     97.9 degrees F oral Pulse rate:   87 / minute Pulse rhythm:   irregular Resp:     16 per minute BP sitting:   128 / 72  (left arm) Cuff size:   regular  Vitals Entered By: [name] CMA (February 17, 2011 9:10 AM)  O2 Flow:  Room air CC: New to establish Is Patient Diabetic? No Pain Assessment Patient in pain? no       Does patient need assistance? Functional Status Self care Ambulation Normal   Primary Care Provider:  [name] MD  CC:  New to establish.  History of Present Illness: New to me he needs a new PCP, he offers no complaints today.  Preventive Screening-Counseling & Management  Alcohol-Tobacco     Alcohol drinks/day: 0     Alcohol Counseling: not indicated; patient does not drink     Smoking Status: never     Tobacco Counseling: not indicated; no tobacco use  Medications Prior to Update: 1)  Adult Aspirin Low Strength 81 Mg  Tbdp (Aspirin) .... Take 1 Tablet By Mouth Once A Day 2)  Metoprolol Tartrate 25 Mg  Tabs (Metoprolol Tartrate) .... Take 1 Tablet By Mouth Two Times A Day 3)  Citracal Plus  Tabs (Multiple Minerals-Vitamins) .... Take 1 Tablet Three Times Daily 4)  Warfarin Sodium 1 Mg  Tabs (Warfarin Sodium) .... Use As Directed 5)  Crestor 10 Mg  Tabs (Rosuvastatin Calcium) .... Take 1 Tablet By Mouth Once A Day 6)  Finasteride 5 Mg  Tabs (Finasteride) .... Take 1 Tablet By Mouth Once A Day 7)  Fluticasone Propionate 50 Mcg/act  Susp (Fluticasone Propionate) .... Take 2 Sprays in Each Nostril Once A Day 8)  Terazosin Hcl 5 Mg  Caps (Terazosin Hcl) .... Take 1 Capsule By Mouth Once A Day 9)  Omeprazole 40 Mg Cpdr (Omeprazole) .... Take 1 Capsule Every Day 10)  Lyrica 75 Mg Caps (Pregabalin) .... Take 1 Tablet By Mouth Once A Day 11)  Alendronate Sodium  70 Mg Tabs (Alendronate Sodium) .... Take 1 Tablet Evey Week 12)  Ambien 10 Mg Tabs (Zolpidem Tartrate) .... Take 1/2 Tab Qhs 13)  Allegra Allergy 180 Mg Tabs (Fexofenadine Hcl) .[name] Kitchen.. 1 By Mouth Daily  Current Medications (verified): 1)  Adult Aspirin Low Strength 81 Mg  Tbdp (Aspirin) .... Take 1 Tablet By Mouth Once A Day 2)  Metoprolol Tartrate 25 Mg  Tabs (Metoprolol Tartrate) .... Take 1 Tablet By Mouth Two Times A Day 3)  Citracal Plus  Tabs (Multiple Minerals-Vitamins) .... Take 1 Tablet Three Times Daily 4)  Warfarin Sodium 1 Mg  Tabs (Warfarin Sodium) .... Use As Directed 5)  Crestor 10 Mg  Tabs (Rosuvastatin Calcium) .... Take 1 Tablet By Mouth Once A Day 6)  Finasteride 5 Mg  Tabs (Finasteride) .... Take 1 Tablet By Mouth Once A Day 7)  Fluticasone Propionate 50 Mcg/act  Susp (Fluticasone Propionate) .... Take 2 Sprays in Each Nostril Once A Day 8)  Terazosin Hcl 5 Mg  Caps (Terazosin Hcl) .... Take 1 Capsule By Mouth Once A Day 9)  Omeprazole 40 Mg Cpdr (Omeprazole) .... Take  1 Capsule Every Day 10)  Lyrica 75 Mg Caps (Pregabalin) .... Take 1 Tablet By Mouth Once A Day 11)  Alendronate Sodium 70 Mg Tabs (Alendronate Sodium) .... Take 1 Tablet Evey Week 12)  Ambien 10 Mg Tabs (Zolpidem Tartrate) .... Take 1/2 Tab Qhs 13)  Allegra Allergy 180 Mg Tabs (Fexofenadine Hcl) .[name] Kitchen.. 1 By Mouth Daily  Allergies (verified): 1)  ! Penicillin 2)  ! Sulfa 3)  ! Flagyl 4)  ! Doxycycline Hyclate (Doxycycline Hyclate) 5)  ! Erythromycin 6)  ! Tetracycline 7)  ! 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) Hx of EPISTAXIS (ICD-784.7) SINUSITIS, CHRONIC (ICD-473.9) ALLERGIC RHINITIS  (ICD-477.9) SPINAL STENOSIS (ICD-724.00)  Past Surgical History: Last updated: 2009/03/08 Permanent Pacemaker  September 2007 Removal of renal cyst and stricture  Family History: Last updated: 2009/03/08 Father deceased age 76 CVA  Social History: Last updated: 08-Mar-2009 Married, non smoker, 4 children, assists at [facility]  Risk Factors: Alcohol Use: 0 (02/17/2011)  Risk Factors: Smoking Status: never (02/17/2011)  Family History: Reviewed history from 03/08/2009 and no changes required. Father deceased age 57 CVA  Social History: Reviewed history from 03/08/2009 and no changes required. Married, non smoker, 4 children, assists at [facility]  Review of Systems  The patient denies anorexia, fever, weight loss, weight gain, chest pain, syncope, dyspnea on exertion, peripheral edema, prolonged cough, headaches, hemoptysis, abdominal pain, hematuria, suspicious skin lesions, transient blindness, difficulty walking, depression, unusual weight change, abnormal bleeding, enlarged lymph nodes, and angioedema.   CV:  Denies chest pain or discomfort, difficulty breathing at night, difficulty breathing while lying down, fainting, fatigue, leg cramps with exertion, lightheadness, near fainting, palpitations, shortness of breath with exertion, swelling of hands, and weight gain.  Physical Exam  General:  Well developed, well nourished, in no acute distress. Head:  normocephalic and atraumatic.   Eyes:  PERRLA/EOM intact; conjunctiva and lids normal. Mouth:  Teeth, gums and palate normal. Oral mucosa normal. Neck:  supple, full ROM, no masses, no thyromegaly, no thyroid nodules or tenderness, and no JVD.   Lungs:  normal respiratory effort, no intercostal retractions, no accessory muscle use, normal breath sounds, no dullness, no fremitus, no crackles, and no wheezes.   Heart:  PMI non displaced.  Normal S1 and S2...  Regular rhythm.  No def murmur. Abdomen:  right inguinal  hernia.  soft, non-tender, normal bowel sounds, no distention, no masses, no abdominal hernia, no hepatomegaly, no splenomegaly, and umbilical hernia.   Msk:  Back normal, normal gait. Muscle strength and tone normal. Pulses:  pulses normal in all 4 extremities Extremities:  No clubbing or cyanosis. Neurologic:  Alert and oriented x 3. Skin:  turgor normal, color normal, no rashes, no suspicious lesions, no ecchymoses, no petechiae, no purpura, no ulcerations, and no edema.   Cervical Nodes:  No lymphadenopathy noted Psych:  Cognition and judgment appear intact. Alert and cooperative with normal attention span and concentration. No apparent delusions, illusions, hallucinations   Impression & Recommendations:  Problem # 1:  ATRIAL FIBRILLATION (ICD-427.31) Assessment Unchanged  His updated medication list for this problem includes:    Adult Aspirin Low Strength 81 Mg Tbdp (Aspirin) .[name] Kitchen... Take 1 tablet by mouth once a day    Metoprolol Tartrate 25 Mg Tabs (Metoprolol tartrate) .[name] Kitchen... Take 1 tablet  by mouth two times a day    Warfarin Sodium 1 Mg Tabs (Warfarin sodium) ..... Use as directed  Reviewed the following: PT: 20.8 (06/19/2010)   INR: 2.3 (10/01/2010) Coumadin Dose (weekly): 5.50 mg (02/01/2011) Prior Coumadin Dose (weekly): 5.50 mg (02/01/2011) Next Protime: 03/01/2011 (dated on 02/01/2011)  Complete Medication List: 1)  Adult Aspirin Low Strength 81 Mg Tbdp (Aspirin) .... Take 1 tablet by mouth once a day 2)  Metoprolol Tartrate 25 Mg Tabs (Metoprolol tartrate) .... Take 1 tablet by mouth two times a day 3)  Citracal Plus Tabs (Multiple minerals-vitamins) .... Take 1 tablet three times daily 4)  Warfarin Sodium 1 Mg Tabs (Warfarin sodium) .... Use as directed 5)  Crestor 10 Mg Tabs (Rosuvastatin calcium) .... Take 1 tablet by mouth once a day 6)  Finasteride 5 Mg Tabs (Finasteride) .... Take 1 tablet by mouth once a day 7)  Fluticasone Propionate 50 Mcg/act Susp (Fluticasone  propionate) .... Take 2 sprays in each nostril once a day 8)  Terazosin Hcl 5 Mg Caps (Terazosin hcl) .... Take 1 capsule by mouth once a day 9)  Omeprazole 40 Mg Cpdr (Omeprazole) .... Take 1 capsule every day 10)  Lyrica 75 Mg Caps (Pregabalin) .... Take 1 tablet by mouth once a day 11)  Alendronate Sodium 70 Mg Tabs (Alendronate sodium) .... Take 1 tablet evey week 12)  Ambien 10 Mg Tabs (Zolpidem tartrate) .... Take 1/2 tab qhs 13)  Allegra Allergy 180 Mg Tabs (Fexofenadine hcl) .[name] Kitchen.. 1 by mouth daily  Patient Instructions: 1)  Please schedule a follow-up appointment as needed.   Orders Added: 1)  New Patient Level III [60454]

## 2011-02-26 ENCOUNTER — Telehealth (INDEPENDENT_AMBULATORY_CARE_PROVIDER_SITE_OTHER): Payer: Self-pay | Admitting: *Deleted

## 2011-02-26 ENCOUNTER — Encounter (HOSPITAL_COMMUNITY): Payer: Self-pay

## 2011-03-01 ENCOUNTER — Encounter (HOSPITAL_COMMUNITY): Payer: Self-pay | Attending: Cardiology

## 2011-03-01 ENCOUNTER — Encounter (INDEPENDENT_AMBULATORY_CARE_PROVIDER_SITE_OTHER): Payer: Medicare Other

## 2011-03-01 ENCOUNTER — Encounter: Payer: Self-pay | Admitting: Cardiology

## 2011-03-01 DIAGNOSIS — I4891 Unspecified atrial fibrillation: Secondary | ICD-10-CM | POA: Insufficient documentation

## 2011-03-01 DIAGNOSIS — Z7901 Long term (current) use of anticoagulants: Secondary | ICD-10-CM

## 2011-03-01 DIAGNOSIS — I10 Essential (primary) hypertension: Secondary | ICD-10-CM | POA: Insufficient documentation

## 2011-03-01 DIAGNOSIS — F17200 Nicotine dependence, unspecified, uncomplicated: Secondary | ICD-10-CM | POA: Insufficient documentation

## 2011-03-01 DIAGNOSIS — I252 Old myocardial infarction: Secondary | ICD-10-CM | POA: Insufficient documentation

## 2011-03-01 DIAGNOSIS — Z9861 Coronary angioplasty status: Secondary | ICD-10-CM | POA: Insufficient documentation

## 2011-03-01 DIAGNOSIS — I2510 Atherosclerotic heart disease of native coronary artery without angina pectoris: Secondary | ICD-10-CM | POA: Insufficient documentation

## 2011-03-01 DIAGNOSIS — Z950 Presence of cardiac pacemaker: Secondary | ICD-10-CM | POA: Insufficient documentation

## 2011-03-01 DIAGNOSIS — E7800 Pure hypercholesterolemia, unspecified: Secondary | ICD-10-CM | POA: Insufficient documentation

## 2011-03-01 DIAGNOSIS — Z951 Presence of aortocoronary bypass graft: Secondary | ICD-10-CM | POA: Insufficient documentation

## 2011-03-01 DIAGNOSIS — Z5189 Encounter for other specified aftercare: Secondary | ICD-10-CM | POA: Insufficient documentation

## 2011-03-01 LAB — CONVERTED CEMR LAB: POC INR: 2.7

## 2011-03-02 NOTE — Letter (Signed)
Summary: [facility]   Imported By: Sherian Rein 02/22/2011 10:20:48  _____________________________________________________________________  External Attachment:    Type:   Image     Comment:   External Document

## 2011-03-02 NOTE — Assessment & Plan Note (Signed)
Summary: Cardiology Office Visit - Pre-Op Eval   Visit Type:  surgical clearance Primary Provider:  [name] MD  CC:  shortness of breath.  History of Present Illness: 77 y/o male with h/o CAD s/p remote CABG followed by BMS to VG-OM in 11/2002.  Other grafts were patent at that time.  Pt. has not had stress test in many years.  Last May, pt was admitted with acute necrotizing cholecystitis complicated by serratia bacteremia and slight bump in Ti to 0.12.  Pt underwent urgent cholecystectomy, and despite prolonged hospitalization, recovered well.  He had been reasonably active, working out @ cardiac rehab 3x/wk up until a month or so ago, when he began to have issues with discomfort related to a right inguinal hernia.  As a result, he has been evaluated by dr. Abbey Chatters and is tentatively planned for elective hernia repair in the coming month or so (per pt).  He has not had chest pain or dyspnea with normal activity in a few years, though activity has been limited over the past month.  EKG  Procedure date:  02/22/2011  Findings:      v paced, 89 w/o magnet, underlying a. fib. av paced, 85 w magnet.   Current Medications (verified): 1)  Adult Aspirin Low Strength 81 Mg  Tbdp (Aspirin) .... Take 1 Tablet By Mouth Once A Day 2)  Metoprolol Tartrate 25 Mg  Tabs (Metoprolol Tartrate) .... Take 1 Tablet By Mouth Two Times A Day 3)  Citracal Plus  Tabs (Multiple Minerals-Vitamins) .... Take 1 Tablet Three Times Daily 4)  Warfarin Sodium 1 Mg  Tabs (Warfarin Sodium) .... Use As Directed 5)  Crestor 10 Mg  Tabs (Rosuvastatin Calcium) .... Take 1 Tablet By Mouth Once A Day 6)  Finasteride 5 Mg  Tabs (Finasteride) .... Take 1 Tablet By Mouth Once A Day 7)  Fluticasone Propionate 50 Mcg/act  Susp (Fluticasone Propionate) .... Take 2 Sprays in Each Nostril Once A Day 8)  Terazosin Hcl 5 Mg  Caps (Terazosin Hcl) .... Take 1 Capsule By Mouth Once A Day 9)  Omeprazole 40 Mg Cpdr (Omeprazole)  .... Take 1 Capsule Every Day 10)  Lyrica 75 Mg Caps (Pregabalin) .... Take 1 Tablet By Mouth Once A Day 11)  Alendronate Sodium 70 Mg Tabs (Alendronate Sodium) .... Take 1 Tablet Evey Week 12)  Ambien 10 Mg Tabs (Zolpidem Tartrate) .... Take 1/2 Tab Qhs 13)  Allegra Allergy 180 Mg Tabs (Fexofenadine Hcl) .[name] Kitchen.. 1 By Mouth Daily  Allergies (verified): 1)  ! Penicillin 2)  ! Sulfa 3)  ! Flagyl 4)  ! Doxycycline Hyclate (Doxycycline Hyclate) 5)  ! Erythromycin 6)  ! Tetracycline 7)  ! * Clindamycin  Review of Systems       his biggest complaint is heaviness/fullness affecting the right side of his scrotum.  no c/p, dyspnea.  tripped and fell on right knee a few weeks ago, which is getting better.  otw all systems reviewed and neg.  Vital Signs:  Patient profile:   77 year old male Height:      70 inches Weight:      166 pounds BMI:     23.90 Pulse rate:   89 / minute BP sitting:   132 / 78  (left arm) Cuff size:   regular  Vitals Entered By: Hardin Negus, RMA (February 22, 2011 11:08 AM)  Physical Exam  General:  Well developed, well nourished, in no acute distress. Head:  heent: nl  Neck:  supple, w/o bruits/jvd. Lungs:  resp. reg and unlabored, cta. Heart:  rrr, no s3/4 or murmurs. Abdomen:  soft, nt/nd/bs+x4. Msk:  MAE.  no deformity or effusions. Pulses:  distal pulses 1+ Bilat. Extremities:  Trace Bilat. LEE.  No Clubbing, cyanosis. Neurologic:  AAox3. Skin:  w/d Psych:  nl affect.   PPM Specifications Following MD:  [name], MD     PPM Vendor:  [employer]     PPM Model Number:  [id number]     PPM Serial Number:  [id number] PPM DOI:  08/24/2006     PPM Implanting MD:  [name], MD  Lead 1    Location: [facility]     DOI: 08/24/2006     Model #: [id number]     Serial #: [id number]     Status: active Lead 2    Location: [facility]     DOI: 08/24/2006     Model #: 8413     Serial #: [id number]     Status: active   Indications:  Tachy-Brady Syndrome   PPM Follow Up Pacer  Dependent:  No      Episodes Coumadin:  Yes  Parameters Mode:  MVP     Lower Rate Limit:  60     Upper Rate Limit:  130 Paced AV Delay:  150     Sensed AV Delay:  120  Impression & Recommendations:  Problem # 1:  INGUINAL HERNIA, RIGHT (ICD-550.90) pt with inguinal hernia pending surgical repair w/ dr. Abbey Chatters.  from a cardiac perspective, the pt. has been doing well w/o c/p or dyspnea, and up until a month or so ago (related to hernia), was able to partake in regular exercise @ cardiac rehab w/o c/p or activity limiting dyspnea.  pt. has not had a stress test in some time but did undergo urgent surgery in 04/2010, which all things considered he tolerated reasonably well.  He did have slight Trop. bump but was noted to have NL LV fxn on echo.  case discussed with Dr. Daleen Squibb who recommends proceeding with surgery without additional ischemic evaluation preoperatively.  Cont. Bb and statin througout the perioperative period and resume Asa as soon as feasible postoperatively.  Problem # 2:  CAD, ARTERY BYPASS GRAFT (ICD-414.04) See above.  Problem # 3:  ATRIAL FIBRILLATION (ICD-427.31) v paced with underlying a. fib and reasonable rate control.  cont. Bb and coumadin.  pt w/o prior h/o CVA.  plan to hold coumadin w/o bridge prior to surgery at dr. Maris Berger discretion.  resume coumadin as soon as feasible post op.  Other Orders: EKG w/ Interpretation (93000)  Patient Instructions: 1)  Your physician recommends that you schedule a follow-up appointment in: 2 months with DR. Riley Kill AS PER Berkeley Veldman, PA-C... 2)  Your physician recommends that you continue on your current medications as directed. Please refer to the Current Medication list given to you today.

## 2011-03-03 ENCOUNTER — Encounter: Payer: Self-pay | Admitting: Internal Medicine

## 2011-03-03 ENCOUNTER — Encounter (HOSPITAL_COMMUNITY): Payer: Self-pay

## 2011-03-03 ENCOUNTER — Other Ambulatory Visit: Payer: Medicare Other

## 2011-03-03 ENCOUNTER — Other Ambulatory Visit: Payer: Self-pay | Admitting: Internal Medicine

## 2011-03-03 ENCOUNTER — Ambulatory Visit (INDEPENDENT_AMBULATORY_CARE_PROVIDER_SITE_OTHER): Payer: Medicare Other | Admitting: Internal Medicine

## 2011-03-03 ENCOUNTER — Telehealth: Payer: Self-pay | Admitting: Internal Medicine

## 2011-03-03 DIAGNOSIS — R5383 Other fatigue: Secondary | ICD-10-CM | POA: Insufficient documentation

## 2011-03-03 DIAGNOSIS — R5381 Other malaise: Secondary | ICD-10-CM

## 2011-03-03 DIAGNOSIS — E785 Hyperlipidemia, unspecified: Secondary | ICD-10-CM

## 2011-03-03 DIAGNOSIS — D518 Other vitamin B12 deficiency anemias: Secondary | ICD-10-CM | POA: Insufficient documentation

## 2011-03-03 LAB — CBC WITH DIFFERENTIAL/PLATELET
Basophils Absolute: 0.1 10*3/uL (ref 0.0–0.1)
Eosinophils Absolute: 0.3 10*3/uL (ref 0.0–0.7)
Eosinophils Relative: 3.3 % (ref 0.0–5.0)
HCT: 38.5 % — ABNORMAL LOW (ref 39.0–52.0)
Lymphs Abs: 1.9 10*3/uL (ref 0.7–4.0)
MCHC: 33.8 g/dL (ref 30.0–36.0)
MCV: 93.2 fl (ref 78.0–100.0)
Monocytes Absolute: 0.7 10*3/uL (ref 0.1–1.0)
Neutrophils Relative %: 64.2 % (ref 43.0–77.0)
Platelets: 210 10*3/uL (ref 150.0–400.0)
RDW: 15 % — ABNORMAL HIGH (ref 11.5–14.6)

## 2011-03-03 LAB — HEPATIC FUNCTION PANEL
ALT: 21 U/L (ref 0–53)
Total Bilirubin: 0.7 mg/dL (ref 0.3–1.2)

## 2011-03-03 LAB — BASIC METABOLIC PANEL
BUN: 14 mg/dL (ref 6–23)
CO2: 28 mEq/L (ref 19–32)
Chloride: 104 mEq/L (ref 96–112)
Creatinine, Ser: 0.8 mg/dL (ref 0.4–1.5)
Glucose, Bld: 88 mg/dL (ref 70–99)
Potassium: 3.9 mEq/L (ref 3.5–5.1)

## 2011-03-03 LAB — TSH: TSH: 1.07 u[IU]/mL (ref 0.35–5.50)

## 2011-03-03 LAB — IBC PANEL
Iron: 122 ug/dL (ref 42–165)
Transferrin: 306 mg/dL (ref 212.0–360.0)

## 2011-03-05 ENCOUNTER — Encounter (HOSPITAL_COMMUNITY): Payer: Self-pay

## 2011-03-08 ENCOUNTER — Encounter (HOSPITAL_COMMUNITY): Payer: Self-pay

## 2011-03-08 LAB — GLUCOSE, CAPILLARY
Glucose-Capillary: 102 mg/dL — ABNORMAL HIGH (ref 70–99)
Glucose-Capillary: 104 mg/dL — ABNORMAL HIGH (ref 70–99)
Glucose-Capillary: 106 mg/dL — ABNORMAL HIGH (ref 70–99)
Glucose-Capillary: 109 mg/dL — ABNORMAL HIGH (ref 70–99)
Glucose-Capillary: 109 mg/dL — ABNORMAL HIGH (ref 70–99)
Glucose-Capillary: 109 mg/dL — ABNORMAL HIGH (ref 70–99)
Glucose-Capillary: 110 mg/dL — ABNORMAL HIGH (ref 70–99)
Glucose-Capillary: 116 mg/dL — ABNORMAL HIGH (ref 70–99)
Glucose-Capillary: 116 mg/dL — ABNORMAL HIGH (ref 70–99)
Glucose-Capillary: 118 mg/dL — ABNORMAL HIGH (ref 70–99)
Glucose-Capillary: 120 mg/dL — ABNORMAL HIGH (ref 70–99)
Glucose-Capillary: 123 mg/dL — ABNORMAL HIGH (ref 70–99)
Glucose-Capillary: 125 mg/dL — ABNORMAL HIGH (ref 70–99)
Glucose-Capillary: 126 mg/dL — ABNORMAL HIGH (ref 70–99)
Glucose-Capillary: 128 mg/dL — ABNORMAL HIGH (ref 70–99)
Glucose-Capillary: 145 mg/dL — ABNORMAL HIGH (ref 70–99)
Glucose-Capillary: 149 mg/dL — ABNORMAL HIGH (ref 70–99)
Glucose-Capillary: 163 mg/dL — ABNORMAL HIGH (ref 70–99)

## 2011-03-08 LAB — PREPARE FRESH FROZEN PLASMA

## 2011-03-08 LAB — DIFFERENTIAL
Basophils Absolute: 0.1 10*3/uL (ref 0.0–0.1)
Basophils Absolute: 0.1 10*3/uL (ref 0.0–0.1)
Basophils Absolute: 0 10*3/uL (ref 0.0–0.1)
Basophils Absolute: 0 10*3/uL (ref 0.0–0.1)
Basophils Relative: 1 % (ref 0–1)
Basophils Relative: 0 % (ref 0–1)
Eosinophils Absolute: 0.3 10*3/uL (ref 0.0–0.7)
Eosinophils Absolute: 0.3 10*3/uL (ref 0.0–0.7)
Eosinophils Absolute: 0 10*3/uL (ref 0.0–0.7)
Eosinophils Relative: 2 % (ref 0–5)
Eosinophils Relative: 0 % (ref 0–5)
Lymphocytes Relative: 11 % — ABNORMAL LOW (ref 12–46)
Lymphocytes Relative: 9 % — ABNORMAL LOW (ref 12–46)
Lymphocytes Relative: 5 % — ABNORMAL LOW (ref 12–46)
Lymphs Abs: 1.6 10*3/uL (ref 0.7–4.0)
Lymphs Abs: 1.7 10*3/uL (ref 0.7–4.0)
Lymphs Abs: 0.7 10*3/uL (ref 0.7–4.0)
Lymphs Abs: 1 10*3/uL (ref 0.7–4.0)
Monocytes Absolute: 1.1 10*3/uL — ABNORMAL HIGH (ref 0.1–1.0)
Monocytes Absolute: 0.4 10*3/uL (ref 0.1–1.0)
Neutro Abs: 12.4 10*3/uL — ABNORMAL HIGH (ref 1.7–7.7)
Neutrophils Relative %: 79 % — ABNORMAL HIGH (ref 43–77)
Neutrophils Relative %: 83 % — ABNORMAL HIGH (ref 43–77)
Neutrophils Relative %: 89 % — ABNORMAL HIGH (ref 43–77)

## 2011-03-08 LAB — TYPE AND SCREEN: Antibody Screen: NEGATIVE

## 2011-03-08 LAB — BASIC METABOLIC PANEL
BUN: 14 mg/dL (ref 6–23)
BUN: 15 mg/dL (ref 6–23)
CO2: 24 mEq/L (ref 19–32)
Calcium: 7.7 mg/dL — ABNORMAL LOW (ref 8.4–10.5)
Calcium: 7.9 mg/dL — ABNORMAL LOW (ref 8.4–10.5)
Chloride: 107 mEq/L (ref 96–112)
Creatinine, Ser: 0.69 mg/dL (ref 0.4–1.5)
Creatinine, Ser: 0.95 mg/dL (ref 0.4–1.5)
GFR calc Af Amer: >60 mL/min (ref >60)
GFR calc Af Amer: >60 mL/min (ref >60)
GFR calc Af Amer: >60 mL/min (ref >60)
GFR calc non Af Amer: >60 mL/min (ref >60)
GFR calc non Af Amer: >60 mL/min (ref >60)
GFR calc non Af Amer: >60 mL/min (ref >60)
GFR calc non Af Amer: >60 mL/min (ref >60)
Glucose, Bld: 100 mg/dL — ABNORMAL HIGH (ref 70–99)
Potassium: 3.3 mEq/L — ABNORMAL LOW (ref 3.5–5.1)
Potassium: 3.6 mEq/L (ref 3.5–5.1)
Sodium: 137 mEq/L (ref 135–145)
Sodium: 138 mEq/L (ref 135–145)
Sodium: 138 mEq/L (ref 135–145)

## 2011-03-08 LAB — CULTURE, BLOOD (ROUTINE X 2)
Culture: NO GROWTH
Culture: NO GROWTH

## 2011-03-08 LAB — ABO/RH: ABO/RH(D): A POS

## 2011-03-08 LAB — URINALYSIS, ROUTINE W REFLEX MICROSCOPIC
Glucose, UA: NEGATIVE mg/dL
Glucose, UA: NEGATIVE mg/dL
Ketones, ur: 15 mg/dL — AB
Ketones, ur: 40 mg/dL — AB
Leukocytes, UA: NEGATIVE
Protein, ur: NEGATIVE mg/dL
Protein, ur: NEGATIVE mg/dL
Urobilinogen, UA: 0.2 mg/dL (ref 0.0–1.0)
Urobilinogen, UA: 0.2 mg/dL (ref 0.0–1.0)

## 2011-03-08 LAB — COMPREHENSIVE METABOLIC PANEL
ALT: 23 U/L (ref 0–53)
ALT: 18 U/L (ref 0–53)
ALT: 21 U/L (ref 0–53)
AST: 21 U/L (ref 0–37)
AST: 26 U/L (ref 0–37)
AST: 27 U/L (ref 0–37)
AST: 67 U/L — ABNORMAL HIGH (ref 0–37)
Albumin: 2.4 g/dL — ABNORMAL LOW (ref 3.5–5.2)
Albumin: 2.1 g/dL — ABNORMAL LOW (ref 3.5–5.2)
Albumin: 2.6 g/dL — ABNORMAL LOW (ref 3.5–5.2)
Albumin: 2.8 g/dL — ABNORMAL LOW (ref 3.5–5.2)
Albumin: 3.8 g/dL (ref 3.5–5.2)
Alkaline Phosphatase: 61 U/L (ref 39–117)
Alkaline Phosphatase: 39 U/L (ref 39–117)
Alkaline Phosphatase: 39 U/L (ref 39–117)
Alkaline Phosphatase: 56 U/L (ref 39–117)
BUN: 10 mg/dL (ref 6–23)
BUN: 28 mg/dL — ABNORMAL HIGH (ref 6–23)
BUN: 36 mg/dL — ABNORMAL HIGH (ref 6–23)
CO2: 22 mEq/L (ref 19–32)
CO2: 24 mEq/L (ref 19–32)
Calcium: 7.1 mg/dL — ABNORMAL LOW (ref 8.4–10.5)
Chloride: 110 mEq/L (ref 96–112)
Chloride: 105 mEq/L (ref 96–112)
Chloride: 106 mEq/L (ref 96–112)
Chloride: 107 mEq/L (ref 96–112)
Chloride: 110 mEq/L (ref 96–112)
Creatinine, Ser: 0.79 mg/dL (ref 0.4–1.5)
Creatinine, Ser: 0.82 mg/dL (ref 0.4–1.5)
Creatinine, Ser: 1.22 mg/dL (ref 0.4–1.5)
GFR calc Af Amer: >60 mL/min (ref >60)
GFR calc Af Amer: >60 mL/min (ref >60)
GFR calc Af Amer: >60 mL/min (ref >60)
GFR calc non Af Amer: 58 mL/min — ABNORMAL LOW (ref >60)
GFR calc non Af Amer: >60 mL/min (ref >60)
Potassium: 4.2 mEq/L (ref 3.5–5.1)
Potassium: 3.2 mEq/L — ABNORMAL LOW (ref 3.5–5.1)
Potassium: 3.6 mEq/L (ref 3.5–5.1)
Potassium: 3.7 mEq/L (ref 3.5–5.1)
Sodium: 140 mEq/L (ref 135–145)
Sodium: 137 mEq/L (ref 135–145)
Sodium: 138 mEq/L (ref 135–145)
Total Bilirubin: 0.7 mg/dL (ref 0.3–1.2)
Total Bilirubin: 0.6 mg/dL (ref 0.3–1.2)
Total Bilirubin: 0.8 mg/dL (ref 0.3–1.2)
Total Bilirubin: 0.9 mg/dL (ref 0.3–1.2)
Total Bilirubin: 1.1 mg/dL (ref 0.3–1.2)
Total Protein: 6.2 g/dL (ref 6.0–8.3)

## 2011-03-08 LAB — CBC
HCT: 33.7 % — ABNORMAL LOW (ref 39.0–52.0)
HCT: 32.5 % — ABNORMAL LOW (ref 39.0–52.0)
HCT: 32.9 % — ABNORMAL LOW (ref 39.0–52.0)
HCT: 33 % — ABNORMAL LOW (ref 39.0–52.0)
HCT: 33.8 % — ABNORMAL LOW (ref 39.0–52.0)
HCT: 37.3 % — ABNORMAL LOW (ref 39.0–52.0)
HCT: 39.5 % (ref 39.0–52.0)
Hemoglobin: 11.1 g/dL — ABNORMAL LOW (ref 13.0–17.0)
Hemoglobin: 11.2 g/dL — ABNORMAL LOW (ref 13.0–17.0)
Hemoglobin: 11.3 g/dL — ABNORMAL LOW (ref 13.0–17.0)
Hemoglobin: 11.5 g/dL — ABNORMAL LOW (ref 13.0–17.0)
Hemoglobin: 12.7 g/dL — ABNORMAL LOW (ref 13.0–17.0)
MCHC: 33.6 g/dL (ref 30.0–36.0)
MCHC: 34.5 g/dL (ref 30.0–36.0)
MCHC: 33.9 g/dL (ref 30.0–36.0)
MCHC: 34 g/dL (ref 30.0–36.0)
MCHC: 34.7 g/dL (ref 30.0–36.0)
MCV: 90.3 fL (ref 78.0–100.0)
MCV: 91 fL (ref 78.0–100.0)
MCV: 91 fL (ref 78.0–100.0)
MCV: 90.6 fL (ref 78.0–100.0)
MCV: 90.8 fL (ref 78.0–100.0)
MCV: 91.1 fL (ref 78.0–100.0)
MCV: 91.6 fL (ref 78.0–100.0)
MCV: 91.9 fL (ref 78.0–100.0)
MCV: 91.9 fL (ref 78.0–100.0)
Platelets: 245 10*3/uL (ref 150–400)
Platelets: 268 10*3/uL (ref 150–400)
Platelets: 286 10*3/uL (ref 150–400)
Platelets: 315 10*3/uL (ref 150–400)
Platelets: 315 10*3/uL (ref 150–400)
Platelets: 119 10*3/uL — ABNORMAL LOW (ref 150–400)
Platelets: 124 10*3/uL — ABNORMAL LOW (ref 150–400)
Platelets: 151 10*3/uL (ref 150–400)
Platelets: 174 10*3/uL (ref 150–400)
Platelets: 182 10*3/uL (ref 150–400)
Platelets: 194 10*3/uL (ref 150–400)
Platelets: 195 10*3/uL (ref 150–400)
RBC: 3.59 MIL/uL — ABNORMAL LOW (ref 4.22–5.81)
RBC: 3.62 MIL/uL — ABNORMAL LOW (ref 4.22–5.81)
RBC: 3.63 MIL/uL — ABNORMAL LOW (ref 4.22–5.81)
RBC: 3.68 MIL/uL — ABNORMAL LOW (ref 4.22–5.81)
RBC: 4.13 MIL/uL — ABNORMAL LOW (ref 4.22–5.81)
RDW: 15.6 % — ABNORMAL HIGH (ref 11.5–15.5)
RDW: 15.7 % — ABNORMAL HIGH (ref 11.5–15.5)
RDW: 15.7 % — ABNORMAL HIGH (ref 11.5–15.5)
RDW: 15.1 % (ref 11.5–15.5)
RDW: 15.9 % — ABNORMAL HIGH (ref 11.5–15.5)
WBC: 15.8 10*3/uL — ABNORMAL HIGH (ref 4.0–10.5)
WBC: 16.8 10*3/uL — ABNORMAL HIGH (ref 4.0–10.5)
WBC: 18.6 10*3/uL — ABNORMAL HIGH (ref 4.0–10.5)
WBC: 10.7 10*3/uL — ABNORMAL HIGH (ref 4.0–10.5)
WBC: 13.6 10*3/uL — ABNORMAL HIGH (ref 4.0–10.5)
WBC: 13.6 10*3/uL — ABNORMAL HIGH (ref 4.0–10.5)
WBC: 14.4 10*3/uL — ABNORMAL HIGH (ref 4.0–10.5)
WBC: 15.5 10*3/uL — ABNORMAL HIGH (ref 4.0–10.5)
WBC: 15.9 10*3/uL — ABNORMAL HIGH (ref 4.0–10.5)
WBC: 16.5 10*3/uL — ABNORMAL HIGH (ref 4.0–10.5)
WBC: 16.5 10*3/uL — ABNORMAL HIGH (ref 4.0–10.5)

## 2011-03-08 LAB — PROTIME-INR
INR: 2.81 — ABNORMAL HIGH (ref 0.00–1.49)
INR: 2.91 — ABNORMAL HIGH (ref 0.00–1.49)
INR: 2.95 — ABNORMAL HIGH (ref 0.00–1.49)
INR: 1.44 (ref 0.00–1.49)
INR: 1.91 — ABNORMAL HIGH (ref 0.00–1.49)
INR: 2.63 — ABNORMAL HIGH (ref 0.00–1.49)
Prothrombin Time: 30.2 seconds — ABNORMAL HIGH (ref 11.6–15.2)
Prothrombin Time: 17.4 seconds — ABNORMAL HIGH (ref 11.6–15.2)
Prothrombin Time: 20 seconds — ABNORMAL HIGH (ref 11.6–15.2)
Prothrombin Time: 27.9 seconds — ABNORMAL HIGH (ref 11.6–15.2)

## 2011-03-08 LAB — CK TOTAL AND CKMB (NOT AT ARMC)
CK, MB: 1.8 ng/mL (ref 0.3–4.0)
CK, MB: 3.9 ng/mL (ref 0.3–4.0)
Relative Index: 1.2 (ref 0.0–2.5)
Relative Index: 2.1 (ref 0.0–2.5)
Relative Index: 2.1 (ref 0.0–2.5)
Total CK: 144 U/L (ref 7–232)
Total CK: 260 U/L — ABNORMAL HIGH (ref 7–232)

## 2011-03-08 LAB — URINE MICROSCOPIC-ADD ON

## 2011-03-08 LAB — URINE CULTURE: Culture: NO GROWTH

## 2011-03-08 LAB — TROPONIN I
Troponin I: 0.07 ng/mL — ABNORMAL HIGH (ref 0.00–0.06)
Troponin I: 0.08 ng/mL — ABNORMAL HIGH (ref 0.00–0.06)

## 2011-03-08 LAB — CLOSTRIDIUM DIFFICILE EIA

## 2011-03-09 ENCOUNTER — Telehealth: Payer: Self-pay | Admitting: Cardiology

## 2011-03-09 NOTE — Telephone Encounter (Signed)
I spoke with the pt and he said that Dwayne [name] has already received the records that were previously faxed.

## 2011-03-09 NOTE — Progress Notes (Signed)
Summary: B-12  Phone Note Call from Patient Call back at [facility] Phone [phone]   Summary of Call: Pt states that he gets b12 injections every 3 wks. Ok to schedule this?  Initial call taken by: Lamar Sprinkles, CMA,  February 26, 2011 9:40 AM  Follow-up for Phone Call        yes Follow-up by: [name] MD,  February 26, 2011 9:54 AM  Additional Follow-up for Phone Call Additional follow up Details #1::        there is appt in [facility] for 3/14 w/Dr Yetta Barre Additional Follow-up by: Verdell Face,  March 02, 2011 8:03 AM

## 2011-03-09 NOTE — Medication Information (Signed)
  Anticoagulant Therapy  Managed by: Weston Brass, PharmD Referring MD: Shawnie Pons MD PCP: [name] MD Supervising MD: Jens Som MD, Arlys John Indication 1: Atrial Fibrillation Lab Used: [facility] [place] Site: [address] INR POC 2.7 INR RANGE 2 - 3  Dietary changes: no    Health status changes: no    Bleeding/hemorrhagic complications: no    Recent/future hospitalizations: no    Any changes in medication regimen? no    Recent/future dental: no  Any missed doses?: no       Is patient compliant with meds? yes       Current Medications (verified): 1)  Adult Aspirin Low Strength 81 Mg  Tbdp (Aspirin) .... Take 1 Tablet By Mouth Once A Day 2)  Metoprolol Tartrate 25 Mg  Tabs (Metoprolol Tartrate) .... Take 1 Tablet By Mouth Two Times A Day 3)  Citracal Plus  Tabs (Multiple Minerals-Vitamins) .... Take 1 Tablet Three Times Daily 4)  Warfarin Sodium 1 Mg  Tabs (Warfarin Sodium) .... Use As Directed 5)  Crestor 10 Mg  Tabs (Rosuvastatin Calcium) .... Take 1 Tablet By Mouth Once A Day 6)  Finasteride 5 Mg  Tabs (Finasteride) .... Take 1 Tablet By Mouth Once A Day 7)  Fluticasone Propionate 50 Mcg/act  Susp (Fluticasone Propionate) .... Take 2 Sprays in Each Nostril Once A Day 8)  Terazosin Hcl 5 Mg  Caps (Terazosin Hcl) .... Take 1 Capsule By Mouth Once A Day 9)  Omeprazole 40 Mg Cpdr (Omeprazole) .... Take 1 Capsule Every Day 10)  Lyrica 75 Mg Caps (Pregabalin) .... Take 1 Tablet By Mouth Once A Day 11)  Alendronate Sodium 70 Mg Tabs (Alendronate Sodium) .... Take 1 Tablet Evey Week 12)  Ambien 10 Mg Tabs (Zolpidem Tartrate) .... Take 1/2 Tab Qhs 13)  Allegra Allergy 180 Mg Tabs (Fexofenadine Hcl) .[name] Kitchen.. 1 By Mouth Daily  Allergies (verified): 1)  ! Penicillin 2)  ! Sulfa 3)  ! Flagyl 4)  ! Doxycycline Hyclate (Doxycycline Hyclate) 5)  ! Erythromycin 6)  ! Tetracycline 7)  ! * Clindamycin  Anticoagulation Management History:      Positive risk  factors for bleeding include an age of 29 years or older.  The bleeding index is 'intermediate risk'.  Positive CHADS2 values include Age > 34 years old.  The start date was 08/26/2005.  His last INR was 2.3.  Anticoagulation responsible provider: Jens Som MD, Arlys John.  INR POC: 2.7.  Exp: 12/2011.    Anticoagulation Management Assessment/Plan:      The patient's current anticoagulation dose is Warfarin sodium 1 mg  tabs: use as directed.  The target INR is 2.0-3.0.  The next INR is due 03/29/2011.  Anticoagulation instructions were given to patient.  Results were reviewed/authorized by Weston Brass, PharmD.         Prior Anticoagulation Instructions: INR 2.2   Continue your current dose of 1 tablet everyday except 1/2 tablet on Mondays, Wednesdays, and Fridays.  Recheck INR in 4 weeks.    Current Anticoagulation Instructions: Return to clinic 4 weeks Cont with current regimen Ask if you should be on lovenox shots while holding coumadin for procedure

## 2011-03-09 NOTE — Telephone Encounter (Signed)
Returned patient's call.Dwayne KitchenMarland KitchenNeeds written clearance stating he can come off of Coumadin prior to hernia surgery.  Needs it faxed to  Dr. Abbey Chatters attention  Milas Hock RN.

## 2011-03-09 NOTE — Progress Notes (Signed)
Summary: clearance for surgery- discuss come off coumadin  Phone Note Call from Patient Call back at Home Phone [phone]   Caller: Patient Reason for Call: Talk to Nurse Summary of Call: pt need clearance for surgery - discuss come off coumadin.  Initial call taken by: Lorne Skeens,  February 23, 2011 9:13 AM  Follow-up for Phone Call        I spoke with the pt and he actually saw Ward Givens NP in regards to clearance for hernia repair.  The pt wanted to make sure that Dr Riley Kill is okay with him holding Coumadin 5 days prior to procedure.  I will review this information with Dr Riley Kill and then fax note to Dr Abbey Chatters (will have TS append OV note).  Follow-up by: Julieta Gutting, RN, BSN,  February 23, 2011 10:54 AM  Additional Follow-up for Phone Call Additional follow up Details #1::        OK to hold coumadin.  Patient understands that there is some increase risk with holding anitcoagulation, but for appropriate reasons.  TS Additional Follow-up by: Ronaldo Miyamoto, MD, [facility],  March 01, 2011 6:34 PM    Additional Follow-up for Phone Call Additional follow up Details #2::    Left message for pt to call back. Julieta Gutting, RN, BSN  March 01, 2011 6:42 PM  Pt aware that he can hold coumadin for surgery.  Follow-up by: Julieta Gutting, RN, BSN,  March 02, 2011 10:42 AM

## 2011-03-09 NOTE — Progress Notes (Signed)
  Phone Note Outgoing Call   Summary of Call: [place] - his B12 level is low, please ask him to come in for a B12 injection Initial call taken by: [name] MD,  March 03, 2011 1:16 PM

## 2011-03-09 NOTE — Assessment & Plan Note (Signed)
Summary: FOLLOW UP/ DISCUSS VIT B12 INJECTIONS /NWS #   Vital Signs:  Patient profile:   77 year old male Height:      70 inches Weight:      158.31 pounds O2 Sat:      97 % on Room air Temp:     98.6 degrees F oral Pulse rate:   84 / minute Pulse rhythm:   irregular Resp:     16 per minute BP sitting:   118 / 74  (left arm) Cuff size:   regular  Vitals Entered By: [name] CMA (March 03, 2011 10:39 AM)  O2 Flow:  Room air  Primary Care Provider:  [name] MD   History of Present Illness: He returns and wants to know if he needs to continue with B12 supplements, he tells me that his prior MD gave him B12 injections for fatigue and that he stopped getting the injections about 2 months ago and since then he has felt the fatigue return.  Preventive Screening-Counseling & Management  Alcohol-Tobacco     Alcohol drinks/day: 0     Alcohol Counseling: not indicated; patient does not drink     Smoking Status: never     Tobacco Counseling: not indicated; no tobacco use  Caffeine-Diet-Exercise     Does Patient Exercise: yes      Drug Use:  no.    Medications Prior to Update: 1)  Adult Aspirin Low Strength 81 Mg  Tbdp (Aspirin) .... Take 1 Tablet By Mouth Once A Day 2)  Metoprolol Tartrate 25 Mg  Tabs (Metoprolol Tartrate) .... Take 1 Tablet By Mouth Two Times A Day 3)  Citracal Plus  Tabs (Multiple Minerals-Vitamins) .... Take 1 Tablet Three Times Daily 4)  Warfarin Sodium 1 Mg  Tabs (Warfarin Sodium) .... Use As Directed 5)  Crestor 10 Mg  Tabs (Rosuvastatin Calcium) .... Take 1 Tablet By Mouth Once A Day 6)  Finasteride 5 Mg  Tabs (Finasteride) .... Take 1 Tablet By Mouth Once A Day 7)  Fluticasone Propionate 50 Mcg/act  Susp (Fluticasone Propionate) .... Take 2 Sprays in Each Nostril Once A Day 8)  Terazosin Hcl 5 Mg  Caps (Terazosin Hcl) .... Take 1 Capsule By Mouth Once A Day 9)  Omeprazole 40 Mg Cpdr (Omeprazole) .... Take 1 Capsule Every Day 10)  Lyrica 75  Mg Caps (Pregabalin) .... Take 1 Tablet By Mouth Once A Day 11)  Alendronate Sodium 70 Mg Tabs (Alendronate Sodium) .... Take 1 Tablet Evey Week 12)  Ambien 10 Mg Tabs (Zolpidem Tartrate) .... Take 1/2 Tab Qhs 13)  Allegra Allergy 180 Mg Tabs (Fexofenadine Hcl) .[name] Kitchen.. 1 By Mouth Daily  Current Medications (verified): 1)  Adult Aspirin Low Strength 81 Mg  Tbdp (Aspirin) .... Take 1 Tablet By Mouth Once A Day 2)  Metoprolol Tartrate 25 Mg  Tabs (Metoprolol Tartrate) .... Take 1 Tablet By Mouth Two Times A Day 3)  Citracal Plus  Tabs (Multiple Minerals-Vitamins) .... Take 1 Tablet Twice  Daily 4)  Warfarin Sodium 1 Mg  Tabs (Warfarin Sodium) .... Use As Directed 5)  Crestor 10 Mg  Tabs (Rosuvastatin Calcium) .... Take 1 Tablet By Mouth Once A Day 6)  Finasteride 5 Mg  Tabs (Finasteride) .... Take 1 Tablet By Mouth Once A Day 7)  Fluticasone Propionate 50 Mcg/act  Susp (Fluticasone Propionate) .... Take 2 Sprays in Each Nostril Once A Day 8)  Terazosin Hcl 5 Mg  Caps (Terazosin Hcl) .... Take 1  Capsule By Mouth Once A Day 9)  Omeprazole 40 Mg Cpdr (Omeprazole) .... Take 1 Capsule Every Day 10)  Lyrica 75 Mg Caps (Pregabalin) .... Take 1 Tablet By Mouth Once A Day 11)  Alendronate Sodium 70 Mg Tabs (Alendronate Sodium) .... Take 1 Tablet Evey Week 12)  Ambien 10 Mg Tabs (Zolpidem Tartrate) .... Take 1 Tab Qhs 13)  Allegra Allergy 180 Mg Tabs (Fexofenadine Hcl) .[name] Kitchen.. 1 By Mouth Daily  Allergies (verified): 1)  ! Penicillin 2)  ! Sulfa 3)  ! Flagyl 4)  ! Doxycycline Hyclate (Doxycycline Hyclate) 5)  ! Erythromycin 6)  ! Tetracycline 7)  ! 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) Hx  of EPISTAXIS (ICD-784.7) SINUSITIS, CHRONIC (ICD-473.9) ALLERGIC RHINITIS (ICD-477.9) SPINAL STENOSIS (ICD-724.00)  Past Surgical History: Last updated: 03-01-2009 Permanent Pacemaker  September 2007 Removal of renal cyst and stricture  Family History: Last updated: 2009-03-01 Father deceased age 27 CVA  Social History: Last updated: 03/03/2011 Married, non smoker, 4 children, assists at [facility] Never Smoked Alcohol use-no Drug use-no Regular exercise-yes  Risk Factors: Alcohol Use: 0 (03/03/2011) Exercise: yes (03/03/2011)  Risk Factors: Smoking Status: never (03/03/2011)  Family History: Reviewed history from 03-01-2009 and no changes required. Father deceased age 47 CVA  Social History: Reviewed history from 03/01/2009 and no changes required. Married, non smoker, 4 children, assists at [employer] Never Smoked Alcohol use-no Drug use-no Regular exercise-yes Drug Use:  no Does Patient Exercise:  yes  Review of Systems  The patient denies anorexia, fever, weight loss, weight gain, chest pain, syncope, dyspnea on exertion, peripheral edema, prolonged cough, headaches, hemoptysis, abdominal pain, hematuria, suspicious skin lesions, transient blindness, difficulty walking, depression, unusual weight change, abnormal bleeding, enlarged lymph nodes, and angioedema.   General:  Complains of fatigue; denies chills, fever, loss of appetite, malaise, sleep disorder, sweats, weakness, and weight loss. Heme:  Denies abnormal bruising, bleeding, enlarge lymph nodes, fevers, pallor, and skin discoloration.  Physical Exam  General:  alert, well-developed, well-nourished, well-hydrated, appropriate dress, normal appearance, and healthy-appearing.   Head:  normocephalic, atraumatic, no abnormalities observed, and no abnormalities palpated.   Eyes:  vision grossly intact, pupils equal, and pupils round.   Ears:  R ear normal and L ear normal.   Mouth:  Oral mucosa and  oropharynx without lesions or exudates.  Teeth in good repair. Neck:  supple, full ROM, no masses, no thyromegaly, no thyroid nodules or tenderness, and no JVD.   Lungs:  normal respiratory effort, no intercostal retractions, no accessory muscle use, normal breath sounds, no dullness, no fremitus, no crackles, and no wheezes.   Heart:  PMI non displaced.  Normal S1 and S2...  Regular rhythm.  No def murmur. Abdomen:  right inguinal hernia.  soft, non-tender, normal bowel sounds, no distention, no masses, no abdominal hernia, no hepatomegaly, no splenomegaly, and umbilical hernia.   Msk:  normal ROM, no joint tenderness, no joint swelling, no joint warmth, no redness over joints, no joint deformities, no joint instability, no crepitation, kyphosis, and scoliosis.   Pulses:  pulses normal in all 4 extremities Extremities:  No clubbing or cyanosis. Neurologic:  Alert and oriented x 3. Skin:  turgor normal, color normal, no rashes, no suspicious lesions, no ecchymoses, no petechiae, no purpura, no ulcerations, and no  edema.   Cervical Nodes:  No lymphadenopathy noted Psych:  Cognition and judgment appear intact. Alert and cooperative with normal attention span and concentration. No apparent delusions, illusions, hallucinations   Impression & Recommendations:  Problem # 1:  FATIGUE, ACUTE (ICD-780.79) Assessment New  Orders: TLB-B12 + Folate Pnl (16109_60454-U98/JXB) TLB-BMP (Basic Metabolic Panel-BMET) (80048-METABOL) TLB-CBC Platelet - w/Differential (85025-CBCD) TLB-Hepatic/Liver Function Pnl (80076-HEPATIC) TLB-TSH (Thyroid Stimulating Hormone) (84443-TSH) TLB-IBC Pnl (Iron/FE;Transferrin) (83550-IBC)  Problem # 2:  ANEMIA, B12 DEFICIENCY (ICD-281.1) Assessment: New  Orders: TLB-B12 + Folate Pnl (82746_82607-B12/FOL) TLB-BMP (Basic Metabolic Panel-BMET) (80048-METABOL) TLB-CBC Platelet - w/Differential (85025-CBCD) TLB-Hepatic/Liver Function Pnl (80076-HEPATIC) TLB-TSH (Thyroid  Stimulating Hormone) (84443-TSH) TLB-IBC Pnl (Iron/FE;Transferrin) (83550-IBC)  Complete Medication List: 1)  Adult Aspirin Low Strength 81 Mg Tbdp (Aspirin) .... Take 1 tablet by mouth once a day 2)  Metoprolol Tartrate 25 Mg Tabs (Metoprolol tartrate) .... Take 1 tablet by mouth two times a day 3)  Citracal Plus Tabs (Multiple minerals-vitamins) .... Take 1 tablet twice  daily 4)  Warfarin Sodium 1 Mg Tabs (Warfarin sodium) .... Use as directed 5)  Crestor 10 Mg Tabs (Rosuvastatin calcium) .... Take 1 tablet by mouth once a day 6)  Finasteride 5 Mg Tabs (Finasteride) .... Take 1 tablet by mouth once a day 7)  Fluticasone Propionate 50 Mcg/act Susp (Fluticasone propionate) .... Take 2 sprays in each nostril once a day 8)  Terazosin Hcl 5 Mg Caps (Terazosin hcl) .... Take 1 capsule by mouth once a day 9)  Omeprazole 40 Mg Cpdr (Omeprazole) .... Take 1 capsule every day 10)  Lyrica 75 Mg Caps (Pregabalin) .... Take 1 tablet by mouth once a day 11)  Alendronate Sodium 70 Mg Tabs (Alendronate sodium) .... Take 1 tablet evey week 12)  Ambien 10 Mg Tabs (Zolpidem tartrate) .... Take 1 tab qhs 13)  Allegra Allergy 180 Mg Tabs (Fexofenadine hcl) .[name] Kitchen.. 1 by mouth daily  Patient Instructions: 1)  Please schedule a follow-up appointment in 1 month.   Orders Added: 1)  TLB-B12 + Folate Pnl [82746_82607-B12/FOL] 2)  TLB-BMP (Basic Metabolic Panel-BMET) [80048-METABOL] 3)  TLB-CBC Platelet - w/Differential [85025-CBCD] 4)  TLB-Hepatic/Liver Function Pnl [80076-HEPATIC] 5)  TLB-TSH (Thyroid Stimulating Hormone) [84443-TSH] 6)  TLB-IBC Pnl (Iron/FE;Transferrin) [83550-IBC] 7)  Est. Patient Level IV [14782]

## 2011-03-10 ENCOUNTER — Encounter (HOSPITAL_COMMUNITY): Payer: Self-pay

## 2011-03-12 ENCOUNTER — Encounter (HOSPITAL_COMMUNITY): Payer: Self-pay

## 2011-03-15 ENCOUNTER — Encounter (HOSPITAL_COMMUNITY): Payer: Self-pay

## 2011-03-15 ENCOUNTER — Telehealth: Payer: Self-pay | Admitting: Cardiology

## 2011-03-15 NOTE — Telephone Encounter (Signed)
I spoke with the pt and made him aware that [facility] does not have surgeons in our group.  I instructed the pt to call the surgeons office and make an appointment if his hernia has worsened.  The surgeon will make the decision if pt needs earlier surgical date. Pt agreed with plan.

## 2011-03-16 ENCOUNTER — Telehealth: Payer: Self-pay | Admitting: Cardiology

## 2011-03-16 NOTE — Telephone Encounter (Signed)
The pt's hernia surgery has been rescheduled to 03/25/11.  The pt just wanted to make sure that Dr Riley Kill knew the new surgery date. The pt would like Dr Riley Kill to check in on him while he is in the hospital.  I will forward this message to Dr Riley Kill [facility]).

## 2011-03-17 ENCOUNTER — Encounter (HOSPITAL_COMMUNITY): Payer: Self-pay

## 2011-03-19 ENCOUNTER — Encounter (HOSPITAL_COMMUNITY): Payer: Self-pay

## 2011-03-22 ENCOUNTER — Encounter (HOSPITAL_COMMUNITY): Payer: Medicare Other | Attending: Cardiology

## 2011-03-22 DIAGNOSIS — Z5189 Encounter for other specified aftercare: Secondary | ICD-10-CM | POA: Insufficient documentation

## 2011-03-22 DIAGNOSIS — Z9861 Coronary angioplasty status: Secondary | ICD-10-CM | POA: Insufficient documentation

## 2011-03-22 DIAGNOSIS — I252 Old myocardial infarction: Secondary | ICD-10-CM | POA: Insufficient documentation

## 2011-03-22 DIAGNOSIS — Z951 Presence of aortocoronary bypass graft: Secondary | ICD-10-CM | POA: Insufficient documentation

## 2011-03-22 DIAGNOSIS — Z950 Presence of cardiac pacemaker: Secondary | ICD-10-CM | POA: Insufficient documentation

## 2011-03-22 DIAGNOSIS — E7800 Pure hypercholesterolemia, unspecified: Secondary | ICD-10-CM | POA: Insufficient documentation

## 2011-03-22 DIAGNOSIS — I2510 Atherosclerotic heart disease of native coronary artery without angina pectoris: Secondary | ICD-10-CM | POA: Insufficient documentation

## 2011-03-22 DIAGNOSIS — I4891 Unspecified atrial fibrillation: Secondary | ICD-10-CM | POA: Insufficient documentation

## 2011-03-22 DIAGNOSIS — F17200 Nicotine dependence, unspecified, uncomplicated: Secondary | ICD-10-CM | POA: Insufficient documentation

## 2011-03-22 DIAGNOSIS — I10 Essential (primary) hypertension: Secondary | ICD-10-CM | POA: Insufficient documentation

## 2011-03-23 ENCOUNTER — Encounter (HOSPITAL_COMMUNITY)
Admission: RE | Admit: 2011-03-23 | Discharge: 2011-03-23 | Disposition: A | Discharge status: Home / Self Care | Payer: Medicare Other | Source: Ambulatory Visit | Attending: General Surgery | Admitting: General Surgery

## 2011-03-23 ENCOUNTER — Ambulatory Visit: Payer: Medicare Other

## 2011-03-23 LAB — SURGICAL PCR SCREEN
MRSA, PCR: NEGATIVE
Staphylococcus aureus: POSITIVE — AB

## 2011-03-23 LAB — COMPREHENSIVE METABOLIC PANEL
ALT: 19 U/L (ref 0–53)
Albumin: 3.7 g/dL (ref 3.5–5.2)
Alkaline Phosphatase: 58 U/L (ref 39–117)
Chloride: 107 mEq/L (ref 96–112)
Glucose, Bld: 91 mg/dL (ref 70–99)
Potassium: 4.2 mEq/L (ref 3.5–5.1)
Sodium: 141 mEq/L (ref 135–145)
Total Bilirubin: 0.5 mg/dL (ref 0.3–1.2)
Total Protein: 6.8 g/dL (ref 6.0–8.3)

## 2011-03-23 LAB — CBC
HCT: 38.8 % — ABNORMAL LOW (ref 39.0–52.0)
Hemoglobin: 12.9 g/dL — ABNORMAL LOW (ref 13.0–17.0)
MCH: 30.3 pg (ref 26.0–34.0)
RBC: 4.26 MIL/uL (ref 4.22–5.81)

## 2011-03-23 LAB — PROTIME-INR
INR: 1.52 — ABNORMAL HIGH (ref 0.00–1.49)
Prothrombin Time: 18.5 seconds — ABNORMAL HIGH (ref 11.6–15.2)

## 2011-03-23 LAB — DIFFERENTIAL
Basophils Relative: 1 % (ref 0–1)
Lymphocytes Relative: 26 % (ref 12–46)
Monocytes Absolute: 0.8 10*3/uL (ref 0.1–1.0)
Monocytes Relative: 10 % (ref 3–12)
Neutro Abs: 5.2 10*3/uL (ref 1.7–7.7)
Neutrophils Relative %: 60 % (ref 43–77)

## 2011-03-24 ENCOUNTER — Encounter (HOSPITAL_COMMUNITY): Payer: Medicare Other

## 2011-03-25 ENCOUNTER — Other Ambulatory Visit (HOSPITAL_COMMUNITY): Payer: Self-pay | Admitting: General Surgery

## 2011-03-25 ENCOUNTER — Other Ambulatory Visit: Payer: Self-pay | Admitting: General Surgery

## 2011-03-25 ENCOUNTER — Observation Stay (HOSPITAL_COMMUNITY): Payer: Medicare Other

## 2011-03-25 ENCOUNTER — Observation Stay (HOSPITAL_COMMUNITY)
Admission: RE | Admit: 2011-03-25 | Discharge: 2011-03-26 | Disposition: A | Discharge status: Home / Self Care | Payer: Medicare Other | Source: Ambulatory Visit | Attending: General Surgery | Admitting: General Surgery

## 2011-03-25 ENCOUNTER — Ambulatory Visit (HOSPITAL_COMMUNITY)
Admission: RE | Admit: 2011-03-25 | Discharge: 2011-03-25 | Disposition: A | Discharge status: Home / Self Care | Payer: Medicare Other | Source: Ambulatory Visit | Attending: General Surgery | Admitting: General Surgery

## 2011-03-25 DIAGNOSIS — M129 Arthropathy, unspecified: Secondary | ICD-10-CM | POA: Insufficient documentation

## 2011-03-25 DIAGNOSIS — Z7901 Long term (current) use of anticoagulants: Secondary | ICD-10-CM | POA: Insufficient documentation

## 2011-03-25 DIAGNOSIS — Z950 Presence of cardiac pacemaker: Secondary | ICD-10-CM | POA: Insufficient documentation

## 2011-03-25 DIAGNOSIS — I10 Essential (primary) hypertension: Secondary | ICD-10-CM | POA: Insufficient documentation

## 2011-03-25 DIAGNOSIS — J4489 Other specified chronic obstructive pulmonary disease: Secondary | ICD-10-CM | POA: Insufficient documentation

## 2011-03-25 DIAGNOSIS — J449 Chronic obstructive pulmonary disease, unspecified: Secondary | ICD-10-CM | POA: Insufficient documentation

## 2011-03-25 DIAGNOSIS — Z7982 Long term (current) use of aspirin: Secondary | ICD-10-CM | POA: Insufficient documentation

## 2011-03-25 DIAGNOSIS — I2510 Atherosclerotic heart disease of native coronary artery without angina pectoris: Secondary | ICD-10-CM | POA: Insufficient documentation

## 2011-03-25 DIAGNOSIS — Z951 Presence of aortocoronary bypass graft: Secondary | ICD-10-CM | POA: Insufficient documentation

## 2011-03-25 DIAGNOSIS — K4090 Unilateral inguinal hernia, without obstruction or gangrene, not specified as recurrent: Principal | ICD-10-CM | POA: Insufficient documentation

## 2011-03-25 DIAGNOSIS — N432 Other hydrocele: Secondary | ICD-10-CM | POA: Insufficient documentation

## 2011-03-25 DIAGNOSIS — I4891 Unspecified atrial fibrillation: Secondary | ICD-10-CM | POA: Insufficient documentation

## 2011-03-26 ENCOUNTER — Encounter (HOSPITAL_COMMUNITY): Payer: Medicare Other

## 2011-03-29 ENCOUNTER — Encounter (HOSPITAL_COMMUNITY): Payer: Medicare Other

## 2011-03-30 ENCOUNTER — Encounter: Payer: Medicare Other | Admitting: *Deleted

## 2011-03-30 ENCOUNTER — Ambulatory Visit (INDEPENDENT_AMBULATORY_CARE_PROVIDER_SITE_OTHER): Payer: Medicare Other | Admitting: Cardiology

## 2011-03-30 DIAGNOSIS — Z7901 Long term (current) use of anticoagulants: Secondary | ICD-10-CM

## 2011-03-30 DIAGNOSIS — I4891 Unspecified atrial fibrillation: Secondary | ICD-10-CM

## 2011-03-31 ENCOUNTER — Encounter (HOSPITAL_COMMUNITY): Payer: Medicare Other

## 2011-03-31 NOTE — Op Note (Signed)
NAME:  DELANDO, SATTER NO.:  [id number]  MEDICAL RECORD NO.:  [id number]           PATIENT TYPE:  O  LOCATION:  XRAY                         FACILITY:  [facility]  PHYSICIAN:  Adolph Pollack, M.D.DATE OF BIRTH:  Jun 10, 1932  DATE OF PROCEDURE:  03/25/2011 DATE OF DISCHARGE:  03/25/2011                              OPERATIVE REPORT   PREOPERATIVE DIAGNOSIS:  Right inguinal hernia.  POSTOPERATIVE DIAGNOSES: 1. Right inguinal hernia. 2. Right hydrocele.  PROCEDURE: 1. Right inguinal hernia repair with mesh. 2. Right hydrocelectomy.  ANESTHESIA:  General plus Marcaine local.  INDICATIONS:  [name] is a 77 year old male who had a near falling episode and when he caught himself he had a burning in the right groin area.  Subsequently, he noticed a swelling that got larger and extended from his groin down to the scrotal area.  In the supine position, it is completely reducible on exam and he has a palpable inguinal hernia.  He now presents for repair.  Procedure risks and aftercare were discussed wit him preoperatively.  TECHNIQUE:  He is seen in the holding area and his right groin was marked with my initials.  He was then brought to the operating room, placed supine on the operating room table and general anesthetic was administered.  The hair in the right groin was clipped and the area was sterilely prepped and draped.  Marcaine solution was infiltrated superficially and deep in the right groin.  Right groin incision was made through the skin and subcutaneous tissue until the external oblique aponeurosis was exposed.  I then injected local anesthetic deep to external oblique aponeurosis.  An incision was made in the external oblique aponeurosis through the external ring medially and up toward the anterior-superior iliac spine laterally.  Using blunt dissection, identified the shelving edge of the inguinal ligament inferiorly and the internal oblique  aponeurosis superiorly.  I then isolated the spermatic cord.  He had an indirect hernia that was reduced with a very large patulous internal ring.  I reduced the sac and freed it from the cord.  I palpated the femoral space and no femoral hernia was noted.  I also noted him to have a hydrocele adherent to the cord.  Again using blunt dissection and electrocautery, it was dissected and freed from the cord down to the testicle and I excised the hydrocele sac and sent it to pathology. Testicle was then replaced into the right scrotal area.  Following this I brought a piece of 3 x 6 inches polypropylene mesh into the field.  The spermatic cord was retracted anteriorly and the mesh was anchored to an area 2 cm medial to the pubic tubercle with 2-0 Prolene suture.  The inferior aspect of the mesh was then anchored to the shelving edge of the inguinal ligament with a running 2-0 Prolene up to the level 2 cm lateral to the internal ring.  A slit was cut the mesh creating two tails which were wrapped around spermatic cord.  The superior aspect of the mesh was then anchored to the internal oblique aponeurosis with interrupted 2-0 Vicryl  sutures.  The two tails of the mesh were crossed creating a new internal ring.  These were anchored to the shelving edge of the inguinal ligament with a single 2-0 Prolene suture.  The tip of hemostat could be placed through the new aperture.  Following this I inspected the wound and hemostasis was adequate.  I then tucked the mesh deep to the external oblique aponeurosis and closed external oblique aponeurosis over the mesh with a running 3-0 Vicryl suture.  The subcutaneous tissue was closed with running 3-0 Vicryl suture.  The skin was closed with 4-0 Monocryl subcuticular stitches. Steri-Strips and sterile dressings were applied.  The right testicle was in its normal position in the scrotum.  He tolerated the procedure well without any apparent complications  and was taken to recovery room in satisfactory condition.     Adolph Pollack, M.D.     Kari Baars  D:  03/25/2011  T:  03/26/2011  Job:  [id number]  cc:   Sanda Linger, MD Arturo Morton. Riley Kill, MD, [facility]  Electronically Signed by Avel Peace M.D. on 03/31/2011 04:58:43 PM

## 2011-04-01 ENCOUNTER — Encounter: Payer: Self-pay | Admitting: *Deleted

## 2011-04-02 ENCOUNTER — Encounter (HOSPITAL_COMMUNITY): Payer: Medicare Other

## 2011-04-05 ENCOUNTER — Encounter (HOSPITAL_COMMUNITY): Payer: Medicare Other

## 2011-04-05 ENCOUNTER — Encounter: Payer: Medicare Other | Admitting: *Deleted

## 2011-04-06 ENCOUNTER — Ambulatory Visit: Payer: Self-pay | Admitting: Cardiology

## 2011-04-06 ENCOUNTER — Encounter: Payer: Self-pay | Admitting: *Deleted

## 2011-04-06 LAB — POCT INR: INR: 1.7

## 2011-04-07 ENCOUNTER — Encounter (HOSPITAL_COMMUNITY): Payer: Medicare Other

## 2011-04-09 ENCOUNTER — Encounter (HOSPITAL_COMMUNITY): Payer: Medicare Other

## 2011-04-09 ENCOUNTER — Telehealth: Payer: Self-pay | Admitting: Internal Medicine

## 2011-04-09 NOTE — Telephone Encounter (Signed)
We will check patient in office 5/7 and cancel phone transmission

## 2011-04-09 NOTE — Telephone Encounter (Signed)
Pt has question re him transmitting. Pt would like to talk to someone in the device clinic

## 2011-04-12 ENCOUNTER — Encounter (HOSPITAL_COMMUNITY): Payer: Medicare Other

## 2011-04-14 ENCOUNTER — Encounter (HOSPITAL_COMMUNITY): Payer: Medicare Other

## 2011-04-15 ENCOUNTER — Encounter: Payer: PRIVATE HEALTH INSURANCE | Admitting: *Deleted

## 2011-04-15 ENCOUNTER — Ambulatory Visit (HOSPITAL_COMMUNITY): Admission: RE | Admit: 2011-04-15 | Payer: Medicare Other | Source: Ambulatory Visit | Admitting: General Surgery

## 2011-04-16 ENCOUNTER — Encounter (HOSPITAL_COMMUNITY): Payer: Medicare Other

## 2011-04-19 ENCOUNTER — Encounter (HOSPITAL_COMMUNITY): Payer: Medicare Other

## 2011-04-21 ENCOUNTER — Encounter (HOSPITAL_COMMUNITY): Payer: Medicare Other | Attending: Cardiology

## 2011-04-21 ENCOUNTER — Encounter: Payer: Self-pay | Admitting: Cardiology

## 2011-04-21 DIAGNOSIS — Z951 Presence of aortocoronary bypass graft: Secondary | ICD-10-CM | POA: Insufficient documentation

## 2011-04-21 DIAGNOSIS — I2510 Atherosclerotic heart disease of native coronary artery without angina pectoris: Secondary | ICD-10-CM | POA: Insufficient documentation

## 2011-04-21 DIAGNOSIS — I4891 Unspecified atrial fibrillation: Secondary | ICD-10-CM | POA: Insufficient documentation

## 2011-04-21 DIAGNOSIS — I10 Essential (primary) hypertension: Secondary | ICD-10-CM | POA: Insufficient documentation

## 2011-04-21 DIAGNOSIS — F17200 Nicotine dependence, unspecified, uncomplicated: Secondary | ICD-10-CM | POA: Insufficient documentation

## 2011-04-21 DIAGNOSIS — I252 Old myocardial infarction: Secondary | ICD-10-CM | POA: Insufficient documentation

## 2011-04-21 DIAGNOSIS — Z5189 Encounter for other specified aftercare: Secondary | ICD-10-CM | POA: Insufficient documentation

## 2011-04-21 DIAGNOSIS — Z9861 Coronary angioplasty status: Secondary | ICD-10-CM | POA: Insufficient documentation

## 2011-04-21 DIAGNOSIS — Z950 Presence of cardiac pacemaker: Secondary | ICD-10-CM | POA: Insufficient documentation

## 2011-04-21 DIAGNOSIS — E7800 Pure hypercholesterolemia, unspecified: Secondary | ICD-10-CM | POA: Insufficient documentation

## 2011-04-22 ENCOUNTER — Ambulatory Visit (INDEPENDENT_AMBULATORY_CARE_PROVIDER_SITE_OTHER): Payer: Medicare Other | Admitting: Cardiology

## 2011-04-22 ENCOUNTER — Encounter: Payer: Self-pay | Admitting: Cardiology

## 2011-04-22 ENCOUNTER — Ambulatory Visit (INDEPENDENT_AMBULATORY_CARE_PROVIDER_SITE_OTHER): Payer: Medicare Other | Admitting: *Deleted

## 2011-04-22 DIAGNOSIS — I4891 Unspecified atrial fibrillation: Secondary | ICD-10-CM

## 2011-04-22 DIAGNOSIS — I25810 Atherosclerosis of coronary artery bypass graft(s) without angina pectoris: Secondary | ICD-10-CM

## 2011-04-22 DIAGNOSIS — R040 Epistaxis: Secondary | ICD-10-CM

## 2011-04-22 DIAGNOSIS — Z950 Presence of cardiac pacemaker: Secondary | ICD-10-CM

## 2011-04-22 DIAGNOSIS — Z7901 Long term (current) use of anticoagulants: Secondary | ICD-10-CM

## 2011-04-22 DIAGNOSIS — I495 Sick sinus syndrome: Secondary | ICD-10-CM

## 2011-04-22 DIAGNOSIS — E785 Hyperlipidemia, unspecified: Secondary | ICD-10-CM

## 2011-04-22 LAB — POCT INR: INR: 2.3

## 2011-04-22 NOTE — Assessment & Plan Note (Signed)
Rate controlled with v pacing.  Pacer to be rechecked today.  Tolerating warfarin.

## 2011-04-22 NOTE — Assessment & Plan Note (Signed)
No recurrence at present.

## 2011-04-22 NOTE — Assessment & Plan Note (Signed)
No recurrent chest pain at present.

## 2011-04-22 NOTE — Progress Notes (Signed)
Pacer check in clinic  

## 2011-04-22 NOTE — Assessment & Plan Note (Signed)
Need to check lipid and liver and CPK.

## 2011-04-22 NOTE — Progress Notes (Signed)
HPI:  Dwayne [name] had surgery for hernia and hydrocoele repair.  He tolerated that well.  No current problems.  Feels good.  No chest pain.    Current Outpatient Prescriptions  Medication Sig Dispense Refill  . alendronate (FOSAMAX) 70 MG tablet Take 70 mg by mouth every 7 (seven) days. Take with a full glass of water on an empty stomach.       [name] Kitchen aspirin 81 MG tablet Take 81 mg by mouth daily.        . Calcium-Magnesium-Vitamin D (CITRACAL CALCIUM+D) 600-40-500 MG-MG-UNIT TB24 Take 1 tablet by mouth 2 (two) times daily.        . fexofenadine (ALLEGRA) 180 MG tablet Take 180 mg by mouth daily.        . finasteride (PROSCAR) 5 MG tablet Take 5 mg by mouth daily.        . fluticasone (FLONASE) 50 MCG/ACT nasal spray 2 sprays by Nasal route daily.        . metoprolol tartrate (LOPRESSOR) 25 MG tablet Take 25 mg by mouth daily.        [name] Kitchen omeprazole (PRILOSEC) 40 MG capsule Take 40 mg by mouth daily.        . pregabalin (LYRICA) 75 MG capsule Take 75 mg by mouth daily.        . rosuvastatin (CRESTOR) 10 MG tablet Take 10 mg by mouth daily.        [name] Kitchen terazosin (HYTRIN) 5 MG capsule Take 5 mg by mouth daily.        [name] Kitchen warfarin (COUMADIN) 1 MG tablet Take by mouth as directed.        . zolpidem (AMBIEN) 10 MG tablet Take 10 mg by mouth at bedtime as needed.        [name] Kitchen DISCONTD: warfarin (COUMADIN) 1 MG tablet Take 1 mg by mouth as directed.          Allergies  Allergen Reactions  . Clindamycin     REACTION: upset stomach  . Doxycycline Hyclate     REACTION: n/v/d  . Erythromycin     REACTION: n/v/d  . Metronidazole     REACTION: rash, itching  . Penicillins     REACTION: hives  . Sulfonamide Derivatives     REACTION: hives  . Tetracycline     REACTION: n/v/d    Past Medical History  Diagnosis Date  . Arrhythmia   . Hyperlipidemia   . Bronchiectasis without acute exacerbation   . Disorders of diaphragm   . Plantar fascial fibromatosis   . Epistaxis   . Unspecified sinusitis (chronic)   .  Allergic rhinitis, cause unspecified   . Spinal stenosis, unspecified region other than cervical     Past Surgical History  Procedure Date  . Coronary artery bypass graft   . Insert / replace / remove pacemaker     2007  . Kidney cyst removal     No family history on file.  History   Social History  . Marital Status: Married    Spouse Name: N/A    Number of Children: 4  . Years of Education: N/A   Occupational History  .     Social History Main Topics  . Smoking status: Never Smoker   . Smokeless tobacco: Not on file  . Alcohol Use: No  . Drug Use: No  . Sexually Active: Not on file   Other Topics Concern  . Not on file   Social History Narrative  . No  narrative on file    ROS: Please see the HPI.  All other systems reviewed and negative.  PHYSICAL EXAM:  BP 116/76  Pulse 67  Resp 18  Ht 5\' 10"  (1.778 m)  Wt 162 lb 12.8 oz (73.846 kg)  BMI 23.36 kg/m2  General: Well developed, well nourished, in no acute distress. Head:  Normocephalic and atraumatic. Neck: no JVD Lungs: Clear to auscultation and percussion. Heart: Normal S1 and S2.  No murmur, rubs or gallops.  Abdomen:  Normal bowel sounds; soft; non tender; no organomegaly.  Site of surgery healing well. Pulses: Pulses normal in all 4 extremities. Extremities: No clubbing or cyanosis. No edema. Neurologic: Alert and oriented x 3.  EKG:  Atrial fib.  V pacing.   ASSESSMENT AND PLAN:

## 2011-04-22 NOTE — Patient Instructions (Addendum)
Your physician wants you to follow-up in: 6 MONTHS.  You will receive a reminder letter in the mail two months in advance. If you don't receive a letter, please call our office to schedule the follow-up appointment.  Your physician recommends that you return for a FASTING lipid profile, liver, CK total (414.02, 272.0, 427.31)--nothing to eat or drink after midnight   Your physician recommends that you continue on your current medications as directed. Please refer to the Current Medication list given to you today.

## 2011-04-23 ENCOUNTER — Encounter (HOSPITAL_COMMUNITY): Payer: Medicare Other

## 2011-04-26 ENCOUNTER — Encounter (HOSPITAL_COMMUNITY): Payer: Medicare Other

## 2011-04-26 ENCOUNTER — Telehealth: Payer: Self-pay | Admitting: *Deleted

## 2011-04-26 ENCOUNTER — Telehealth: Payer: Self-pay | Admitting: Cardiology

## 2011-04-26 ENCOUNTER — Encounter: Payer: PRIVATE HEALTH INSURANCE | Admitting: *Deleted

## 2011-04-26 NOTE — Telephone Encounter (Signed)
Pt was previously a Dr Dagoberto Ligas pt - now PCP is Yetta Barre. He needs rfs - did not leave names of meds OR pharm.

## 2011-04-26 NOTE — Telephone Encounter (Signed)
Pt has question re his meds. Pt would like to talk to a nurse. °

## 2011-04-26 NOTE — Telephone Encounter (Signed)
I spoke with the pt and he had a question about his metoprolol tartrate dosage.  The pt takes this twice a day but when reviewing his medication print out from our office he noticed that it said once a day.  I told the pt this was incorrect and he should continue taking it twice a day.  Medication list corrected.

## 2011-04-27 ENCOUNTER — Telehealth: Payer: Self-pay

## 2011-04-27 MED ORDER — ROSUVASTATIN CALCIUM 10 MG PO TABS: 10.0000 mg | ORAL_TABLET | Freq: Every day | ORAL | Status: DC

## 2011-04-27 MED ORDER — TERAZOSIN HCL 5 MG PO CAPS: 5.0000 mg | ORAL_CAPSULE | Freq: Every day | ORAL | Status: DC

## 2011-04-27 NOTE — Telephone Encounter (Signed)
error 

## 2011-04-28 ENCOUNTER — Encounter (HOSPITAL_COMMUNITY): Payer: Medicare Other

## 2011-04-28 ENCOUNTER — Telehealth: Payer: Self-pay | Admitting: *Deleted

## 2011-04-28 MED ORDER — ROSUVASTATIN CALCIUM 10 MG PO TABS: 10.0000 mg | ORAL_TABLET | Freq: Every day | ORAL | Status: DC

## 2011-04-28 MED ORDER — TERAZOSIN HCL 5 MG PO CAPS: 5.0000 mg | ORAL_CAPSULE | Freq: Every day | ORAL | Status: DC

## 2011-04-28 NOTE — Telephone Encounter (Signed)
RX to correct pharm

## 2011-04-30 ENCOUNTER — Encounter (HOSPITAL_COMMUNITY): Payer: Medicare Other

## 2011-05-03 ENCOUNTER — Encounter (HOSPITAL_COMMUNITY): Payer: Medicare Other

## 2011-05-04 NOTE — Assessment & Plan Note (Signed)
[facility]                         ELECTROPHYSIOLOGY OFFICE NOTE   NAME:[name], Dwayne WITTS                         MRN:          [id number]  DATE:09/10/2008                            DOB:          10-Jun-1932    [name] returns today for followup.  He is a very pleasant 77 year old  male with a history of paroxysmal AFib and symptomatic tachy-brady with  syncope secondary to all of the above.  He returns today for followup.  He denies chest pain.  He denies shortness of breath.  He had no  specific complaints today.   PHYSICAL EXAMINATION:  GENERAL:  He is a pleasant well-appearing man in  no acute distress.  VITAL SIGNS:  Blood pressure was 130/75, the pulse was 64 and regular,  and the respirations were 18.  NECK:  Revealed no jugular venous  distention.  LUNGS:  Clear bilaterally to auscultation.  No wheezes, rales, or  rhonchi are present.  CARDIOVASCULAR:  Regular rate and rhythm.  Normal S1 and S2.  EXTREMITIES:  Demonstrate no edema.   Interrogation of the patient's pacemaker demonstrates Medtronic  adaptive.  P and R waves were 2 and 11 respectively.  The impedance was  565 in the A, 560 in the V.  The threshold 0.75 at 0.4 both in the right  atrium and the right ventricle.  Battery voltage was 2.77 volts.  Underlying rhythm was sinus brady at 50.   IMPRESSION:  1. Symptomatic bradycardia.  2. Paroxysmal atrial fibrillation.  3. Status post pacemaker insertion.   DISCUSSION:  Overall [name] is stable.  His pacemaker is working  normally.  His AFib is well controlled.  We will see him back in the  office for pacemaker followup in 1 year.     Doylene Canning. Ladona Ridgel, MD  Electronically Signed    GWT/MedQ  DD: 09/10/2008  DT: 09/10/2008  Job #: [id number]

## 2011-05-04 NOTE — Assessment & Plan Note (Signed)
[facility]                         ELECTROPHYSIOLOGY OFFICE NOTE   [name], Dwayne [name]                         MRN:          [id number]  DATE:08/22/2007                            DOB:          [birth year]    HISTORY OF PRESENT ILLNESS:  Dwayne [name] returns today for follow up visit.  He is a very pleasant elderly male with paroxysmal atrial fibrillation  and some bradycardia, diabetes and hypertension.  He returns today for  follow up.  He had no specific complaints today.  He denied chest pain  or shortness of breath.   PHYSICAL EXAMINATION:  GENERAL:  He is a pleasant, well-appearing man in  no distress.  VITAL SIGNS:  Blood pressure 112/88, pulse 98 and regular, respirations  18.  NECK:  No JVD.  LUNGS:  Clear bilaterally to auscultation.  No wheezes, rales or rhonchi  were present.  CARDIOVASCULAR:  Regular rate and rhythm with normal S1, S2.  EXTREMITIES:  No edema.  He had support stockings in place.   MEDICATIONS:  1. Aspirin.  2. Metoprolol.  3. Warfarin.  4. Crestor 10 daily.  5. Benicar 20 daily.  6. Omeprazole 20 daily.  7. Lasix 20 daily.  8. Lyrica 25 daily.  9. Terazosin 5 daily.   Interrogation of his pacemaker demonstrates Medtronic Adapta with P  waves of 2 and R waves of 11.  The impedance is 549 in the atrium and  521 in the right ventricle.  Threshold is a volt of 0.4 in the atrium  and 0.5 and 0.4 in the right ventricle.  Battery voltage was 2.77 volts.  He was 6% mode switched.  There were no high ventricular rates.  He was  40% V-paced and he was 80% A-paced.   IMPRESSION:  1. Symptomatic tachybrady.  2. Paroxysmal atrial fibrillation.  3. Status post pacemaker.   DISCUSSION:  Overall, Dwayne [name] is stable and his pacemaker is working  normally.  He has continued to do well after his device implant.  We  will plan to see him back in one year for pacemaker follow up.  I have  encouraged him to maintain low sodium  diet.     Doylene Canning. Ladona Ridgel, MD  Electronically Signed    GWT/MedQ  DD: 08/22/2007  DT: 08/22/2007  Job #: [id number]   cc:   Alfonse Alpers. Dagoberto Ligas, M.D.

## 2011-05-04 NOTE — Assessment & Plan Note (Signed)
[facility]ALTHCARE                            CARDIOLOGY OFFICE NOTE   NAME:Janvier, HALVOR BEHREND                         MRN:          [id number]  DATE:12/05/2008                            DOB:          May 15, 1932    Burnice is in for followup.  He has generally done pretty well, but he  says that he has had about three episodes where his dropped his blood  pressure and felt weak.  He has it on log, but he forgot to bring the  log in today.  He is going to go ahead and bring that in as he can.  He  cannot tell whether he was in and out of rhythm at that time.  It has  not been a frequent event.  He says he tends to forget things pretty  easily with regard to that.   MEDICATIONS:  1. Lyrica which he is taking four times a day.  2. Omeprazole 40 mg daily.  3. Fosamax 70 mg weekly.   ADDITIONAL MEDICATIONS:  1. Metoprolol 25 mg p.o. b.i.d.  2. Citracal D t.i.d.  3. Warfarin as directed.  4. Crestor 10 mg daily.  5. Fexofenadine 180 mg daily.  6. Finasteride 5 mg nightly.  7. Fluticasone nasal spray one daily.  8. Terazosin 5 mg daily.  9. Lasix 20 mg daily.   In addition, the patient says he has seen Dr. Dagoberto Ligas yesterday, and got  a fairly clean bill of health.  He had a lot of laboratory studies done  at that time.  He also wonders whether or not there is any relationship  to his pacemaker adjustments by Dr. Ladona Ridgel.  Again, the patient not  quite sure what is going on at this time.   PHYSICAL EXAMINATION:  GENERAL:  He is alert and oriented.  VITAL SIGNS:  The weight is 210 pounds down 3 pounds from the last visit  of July, he has a blood pressure of 120/80, the pulse is 72.  LUNGS:  The lung fields are clear.  Pacemaker site looks good.  EXTREMITIES:  Do not reveal significant edema.  CARDIAC:  There is no rub on exam.  There is a soft systolic ejection  murmur without diastolic murmur noted.   IMPRESSION:  1. Coronary artery disease status post  coronary artery bypass graft      surgery and subsequent percutaneous intervention for saphenous vein      graft stenosis.  2. History of paroxysmal atrial fibrillation now with atrial pacing      and underlying sick sinus syndrome.  3. Coumadin anticoagulation.  4. History of nosebleeds.  5. History of spinal stenosis.  6. Hypercholesterolemia on lipid lowering therapy.   RECOMMENDATIONS:  1. Return to clinic in 6 months.  2. The patient will bring his logs in and we will try to review these      when he gets them to [place].  I will plan to see him back in follow up      in about 6 months time.   ADDENDUM:  EKG today reveals  atrial pacing with underlying rate of 72,  PR interval was long at 224 milliseconds.  There was evidence of  probably a posterior infarct with prominent R in V1.     Arturo Morton. Riley Kill, MD, [facility]  Electronically Signed    TDS/MedQ  DD: 12/05/2008  DT: 12/05/2008  Job #: [id number]

## 2011-05-04 NOTE — Assessment & Plan Note (Signed)
[facility]ALTHCARE                            CARDIOLOGY OFFICE NOTE   [name], Dwayne [name]                         MRN:          [id number]  DATE:08/14/2008                            DOB:          08/15/1932    Dwayne [name] is in for followup.  He really is doing quite well.  He never  notices when he is in atrial fibrillation or not now.  He is scheduled  to have a followup pacer check with Dr. Ladona Ridgel next month.  He did have  an EKG back here in June, and at that time he was in sinus rhythm with  ventricular pacing and has been in his baseline.   Exam today reveals blood pressure 110/80, pulse 80.  Lung fields clear.  Cardiac rhythm is regular.  Extremities reveal no edema.   Overall, he continues to do well.  His most recent radionuclide imaging  study revealed normal uptake in all myocardial segments with an ejection  fraction of 62%.  There was a question of very mild inferobasal  ischemia, but this was really a low-risk study.  Based on these  findings, we would recommend continued medical therapy.  Should he have  any increased problems, he is to call us.     Arturo Morton. Riley Kill, MD, [facility]  Electronically Signed    TDS/MedQ  DD: 08/14/2008  DT: 08/15/2008  Job #: [id number]   cc:   Alfonse Alpers. Dagoberto Ligas, M.D.

## 2011-05-04 NOTE — Assessment & Plan Note (Signed)
[facility] HEALTHCARE                                 ON-CALL NOTE   Dwayne, [name]                         MRN:          [id number]  DATE:01/03/2009                            DOB:          05-06-1932    PRIMARY CARDIOLOGIST:  Arturo Morton. Riley Kill, MD, [facility]   I received a call from Byrd Hesselbach, RN at cardiac rehab stating that Dwayne [name]  is in unmonitored rehab over there and felt that his heart rate may be  irregular.  He does have a history of atrial fibrillation/flutter.  He  is on Coumadin chronically.  They faxed over a strip and in fact he is  in atrial fibrillation with pacing on demand.  He is rate controlled in  the 60s to 70s.  I spoke with [name] and he says currently he is  asymptomatic and thinks he did skip a dose of metoprolol last night.  He  did take a dose at about 12 o'clock today.  I recommend that he take  another dose now and also his evening dose when that is due.  Recommended, provided that he is asymptomatic since he is already on  Coumadin that there is no reason for him to come into the ED.  However,  if he were to become symptomatic at any point that he should give [place] a  call back and we may need to bring into the ER for evaluation.  He notes  that he does have a history of paroxysmal atrial fibrillation and has  been told by Dr. Riley Kill and Dr. Ladona Ridgel in the past he may go in and out  and that normally if he goes in, it will generally last a couple of  hours to a day before resolve spontaneously.  He will call us back with  any questions or concerns.     Nicolasa Ducking, ANP  Electronically Signed    CB/MedQ  DD: 01/03/2009  DT: 01/04/2009  Job #: [id number]

## 2011-05-04 NOTE — Assessment & Plan Note (Signed)
[facility]ALTHCARE                            CARDIOLOGY OFFICE NOTE   [name], Dwayne [name]                         MRN:          [id number]  DATE:05/20/2008                            DOB:          08-31-1932    Dwayne [name] is in for followup.  He continues to remain cardiac rehab.  Despite that, he feels like he has increasing fatigue.  He says he is  having a little bit harder time getting around, and he is quite worried  about the status of his grafts.  He had his bypass surgery in 1993 and  subsequently had stenting of the ostium of the saphenous vein graft to  the circumflex system.  He denies any anginal-type symptoms.  His  hemoglobin was down, and he tells me that there was an issue as to  whether there was interference with his iron from Fosamax.  His Fosamax  has been on hold, and he has been getting iron injections.  He tells me  that his hemoglobin is back up to 14.   CURRENT MEDICATIONS:  1. Aspirin 81 mg daily.  2. Metoprolol 25 mg b.i.d.  3. Citracal-D 630 mg t.i.d.  4. Warfarin as directed.  5. Crestor 10 mg.  6. Fexofenadine 180 mg daily.  7. Finasteride 5 mg q.h.s.  8. Fluticasone spray daily.  9. Terazosin 5 mg daily.  10.Benicar 20 mg daily.  11.Fosamax weekly.  12.Omeprazole 20 mg daily.  13.Lasix 20 mg daily.  14.Lyrica 50 t.i.d.   PHYSICAL EXAMINATION:  VITAL SIGNS:  Blood pressure is 118/80, the pulse  is 69.  LUNG FIELDS:  Clear.  CHEST:  The pacemaker site looks good.  EXTREMITIES:  There is very little extremity edema.  CARDIAC:  Rhythm is regular, without a significant murmur.   The electrocardiogram demonstrates atrial tracking, with ventricular  pacing.  The underlying rate is 69.   IMPRESSION:  1. Coronary artery disease, status post coronary artery bypass graft      surgery, with last catheterization 2003, with percutaneous      intervention of the ostium of the saphenous vein graft to the      obtuse marginal and  circumflex.  2. Recent increase in fatigue despite continued involvement in cardiac      rehab.   PLAN:  We had extensive discussion today.  He is concerned about the  overall situation.  However, repeat catheterization would require  discontinuation of Coumadin, and we are both in agreement that this  should not be done.  We should be able to get some idea with myocardial  perfusion imaging.  Even though he is not having chest pain, he never  really has had a lot of chest discomfort and, most importantly, he has  had increasing fatigue despite cardiac rehab.  I will see him back in 4  weeks' time after the adenosine Myoview is performed.     Arturo Morton. Riley Kill, MD, [facility]  Electronically Signed    TDS/MedQ  DD: 05/20/2008  DT: 05/20/2008  Job #: [id number]   cc:   Alfonse Alpers.  Dagoberto Ligas, M.D.  Kathryne Hitch, MD

## 2011-05-04 NOTE — Assessment & Plan Note (Signed)
[facility]                            CARDIOLOGY OFFICE NOTE   [name], Dwayne [name]                         MRN:          [id number]  DATE:10/16/2007                            DOB:          05-Apr-1932    Dwayne [name] is in for followup.  He is stable.  He is doing really quite well.  He is not having any ongoing chest pain or shortness of breath.  He has  really been quite stable.  He has also not been having any significant  bleeding.   MEDICATIONS:  1. Aspirin 81 mg daily.  2. Metoprolol 25 mg p.o. b.i.d.  3. Citrucel 630 mg t.i.d.  4. Warfarin as directed.  5. Crestor 10 mg daily.  6. Fexofenadine 180 mg daily.  7. Finasteride 5 mg daily.  8. __________ 1 spray daily.  9. Terazocine 5 mg daily.  10.Benicar 20 mg daily.  11.Fosamax 70 mg per week.  12.Omeprazole 20 mg daily.  13.Lasix 20 mg daily.  14.Azithromycin and Lyrica 50 mg p.o. t.i.d.   PHYSICAL:  Blood pressure is 138/79.  The pulse is 87.  The lung fields are clear.  The cardiac rhythm is regular without a significant murmur.  I also  carefully auscultated the carotids and did not hear a significant bruit.  EXTREMITIES:  Do not reveal significant edema.   EKG reveals normal sinus rhythm.  There is an occasional premature  ventricular contraction following the PVC with prolonged pause.  There  is a ventricular escape rhythm.   IMPRESSION:  1. Coronary artery disease status post coronary artery bypass graft      surgery and percutaneous intervention.  2. Paroxysmal atrial fibrillation.  3. History of nose bleed.  4. Recurrent back discomfort secondary to spinal stenosis.   PLAN:  1. Continue current medical regimen.  2. Return to clinic in 3 months.     Arturo Morton. Riley Kill, MD, [facility]  Electronically Signed    TDS/MedQ  DD: 10/16/2007  DT: 10/16/2007  Job #: [id number]   cc:   Alfonse Alpers. Dagoberto Ligas, M.D.

## 2011-05-04 NOTE — Letter (Signed)
January 17, 2008    Alfonse Alpers. Dagoberto Ligas, M.D.  [address], [address]  [place], [place] [address]   RE:  [name], [name]  MRN:  [id number]  /  DOB:  [birth year]   Dear [name]:   I had the pleasure of seeing Dwayne [name] in the office today in a follow-  up visit.  Clinically he seems to be doing extremely well.  He denies  any ongoing chest pain or significant shortness of breath.  He has been  continuing cardiac rehab without difficulty.   MEDICATIONS:  1. Aspirin 81 mg daily.  2. Metoprolol 25 mg p.o. b.i.d.  3. Citrucel-D 636 mg t.i.d.  4. Warfarin as directed.  5. Crestor 10 mg daily.  6. Fexofenadine 180 mg daily.  7. Finasteride 5 mg q.h.s.  8. Fluticasone propionate spray one daily.  9. Terazosin 5 mg daily/  10.Benicar 20 mg daily.  11.Fosamax 70 mg weekly.  12.Omeprazole 20 mg daily.  13.Lasix 20 mg daily.  14.Lyrica 50 mg t.i.d.   On physical, he is alert and he is oriented, in no distress.  The blood pressure is 150/90 with a pulse of 68.  The lung fields are entirely clear.  The cardiac rhythm is regular.  There is not significant extremity edema.   His EKG reveals atrial pacing with ventricular tracking.   To summarize, he seems to be getting along extremely well.  He did tell  me that he has been on some iron supplementation.  I presume this is for  anemia.  We have asked to try to get that information from your office.  More importantly, we also want to be aware that he could potentially  come off aspirin if this is an ongoing issue.  Simple Coumadin would be  appropriate.   I appreciate the opportunity of sharing this nice gentleman's care, and  we will continue to see him in follow-up in 4 months.    Sincerely,      Arturo Morton. Riley Kill, MD, [facility]  Electronically Signed    TDS/MedQ  DD: 01/17/2008  DT: 01/18/2008  Job #: [id number]

## 2011-05-04 NOTE — Assessment & Plan Note (Signed)
[facility] HEALTHCARE                            CARDIOLOGY OFFICE NOTE   [name]                         MRN:          [id number]  DATE:04/13/2007                            DOB:          February 19, 1932    Dwayne [name] is in for followup.  His main complaint has been low back pain.  He has no specific cardiac complaints, and specifically, he has not been  having problems with runaway heart, at least at the present.  He needs  to have some shots in his back.   He has not had any bleeding at his pacer site.   MEDICATIONS:  1. Aspirin 81 mg daily.  2. Metoprolol 25 mg p.o. b.i.d.  3. Citracal D.  4. Digoxin - on hold.  5. Warfarin as directed.  6. Crestor 10 mg daily.  7. Fexofenadine 100 mg daily.  8. Finasteride 5 mg daily.  9. Fluticasone.  10.Terazosin 5 mg daily.  11.Benicar 20 mg daily.  12.Fosamax.  13.Omeprazole 20 mg daily.  14.Lasix 20 mg daily.   PHYSICAL EXAMINATION:  Dwayne [name] is basically unchanged.  He is alert and  oriented, in no acute distress.  Blood pressure is 130/84, and the pulse is 87 and regular.  LUNGS:  Fields are actually quite clear.  CARDIAC:  Rhythm is regular.  There is not a significant murmur on exam  at present.   Without the magnet, the patient has an underlying rate of 83.  There  appears to be atrial ventricular pacing.   IMPRESSION:  1. History of paroxysmal atrial fibrillation with sick sinus syndrome.  2. Coronary artery disease with prior coronary artery bypass grafting      surgery.  3. Low back pain.   RECOMMENDATIONS:  [name] can continue in the cardiac rehab program.  More  importantly, he will need to potentially stop his Coumadin, at least  briefly, in order to get his shots.  This has been well worked out with  him in the past.  I will see him back in followup in 2 to 3 months.     Arturo Morton. Riley Kill, MD, [facility]  Electronically Signed    TDS/MedQ  DD: 04/13/2007  DT: 04/13/2007  Job #: [id number]

## 2011-05-04 NOTE — Assessment & Plan Note (Signed)
[facility]                            CARDIOLOGY OFFICE NOTE   NAME:Buchbinder, MAYKEL REITTER                         MRN:          [id number]  DATE:07/13/2007                            DOB:          04-24-1932    [name] is in for followup.   He is doing well.  He really has done well from a cardiac standpoint  over the last few months.  He does not seem to be out of rhythm much.  Importantly, he has had increasing back pain which has not been too  responsive really to injections.  He has continued on Coumadin  anticoagulation.  He has had only a small episode of nosebleed and not  any recurrence.   PHYSICAL EXAMINATION:  VITAL SIGNS:  The blood pressure is 142/90, the  pulse is 69.  LUNGS:  Fields are clear.  CARDIAC:  Rhythm is regular.  EXTREMITIES:  Reveal no edema.   His EKG reveals atrial pacing at a rate of 69.  He does not have  ventricular pacing.  His V2, V3, V1 leads have been probably reversed.   IMPRESSION:  1. History of paroxysmal atrial fibrillation now with atrial pacing      and sick sinus and Coumadin anticoagulation.  2. History of nosebleed.  3. Status post coronary artery bypass graft surgery and percutaneous      coronary intervention.  4. Recurrent back discomfort, spinal stenosis.   PLAN:  1. Continue current medical regimen, except decrease aspirin to every      other day.  2. Return to the clinic in 3 months.     Arturo Morton. Riley Kill, MD, [facility]  Electronically Signed    TDS/MedQ  DD: 07/13/2007  DT: 07/13/2007  Job #: [id number]

## 2011-05-04 NOTE — Letter (Signed)
June 13, 2008    Anson Fret, MD  [phone]. [address]  [address]  [place], [place] [address]   RE:  Dwayne [name]  MRN:  [id number]  /  DOB:  05/18/1932   [name],   I had the pleasure of seeing your nice patient, Dwayne [name], in the  office today in followup.  In general, he is stable.  We had him undergo  radionuclide imaging study, this demonstrated a small area of  inferobasal ischemia.  I am not even convinced that it is not an  artifact based on the findings and careful review of the studies.  Nonetheless, we do know that he has some diffuse disease beyond the  graft insertion of his RCA graft from before.  There are no new large  defects noted, and based on this we would recommend continued medical  therapy.  He is only mildly symptomatic from a standpoint of modest  fatigue and he has had no angina whatsoever.   Today on exam, the blood pressure 118/78, pulse 88.  Lung fields clear.  The cardiac rhythm is regular.   Overall, Dwayne [name] appears to be stable.  He has not had recurrence.  As you  know, he also has underlying paroxysmal atrial fibrillation.    We plan to see him back in followup in 2 months' time, but if there  would be a change in status, please do not hesitate to let me know.  Overall, his long-term result from bypass has been excellent.    Sincerely,       Arturo Morton. Riley Kill, MD, [facility]  Electronically Signed     Arturo Morton. Riley Kill, MD, [facility]  Electronically Signed   TDS/MedQ  DD: 06/13/2008  DT: 06/14/2008  Job #: [id number]

## 2011-05-05 ENCOUNTER — Encounter (HOSPITAL_COMMUNITY): Payer: Medicare Other

## 2011-05-06 ENCOUNTER — Ambulatory Visit (INDEPENDENT_AMBULATORY_CARE_PROVIDER_SITE_OTHER): Payer: Medicare Other | Admitting: *Deleted

## 2011-05-06 ENCOUNTER — Other Ambulatory Visit (INDEPENDENT_AMBULATORY_CARE_PROVIDER_SITE_OTHER): Payer: Medicare Other | Admitting: *Deleted

## 2011-05-06 ENCOUNTER — Encounter: Payer: Medicare Other | Admitting: *Deleted

## 2011-05-06 DIAGNOSIS — E785 Hyperlipidemia, unspecified: Secondary | ICD-10-CM

## 2011-05-06 DIAGNOSIS — Z7901 Long term (current) use of anticoagulants: Secondary | ICD-10-CM

## 2011-05-06 DIAGNOSIS — I4891 Unspecified atrial fibrillation: Secondary | ICD-10-CM

## 2011-05-06 LAB — HEPATIC FUNCTION PANEL
AST: 21 U/L (ref 0–37)
Alkaline Phosphatase: 55 U/L (ref 39–117)
Total Bilirubin: 0.4 mg/dL (ref 0.3–1.2)

## 2011-05-06 LAB — LIPID PANEL: Total CHOL/HDL Ratio: 3

## 2011-05-07 ENCOUNTER — Encounter (HOSPITAL_COMMUNITY): Payer: Medicare Other

## 2011-05-07 NOTE — Assessment & Plan Note (Signed)
[facility] HEALTHCARE                              CARDIOLOGY OFFICE NOTE   Dwayne [name], Dwayne [name]                         MRN:          [id number]  DATE:09/01/2006                            DOB:          03/26/1932    PRIMARY CAREGIVER: Corrin Parker, M.D.   PRIMARY CARDIOLOGIST: Arturo Morton. Riley Kill, M.D.   ELECTROPHYSIOLOGIST: [name], M.D.   ALLERGIES:  PENICILLIN, SULFA, FLAGYL, CAFFEINE, CLINDAMYCIN.  He is LACTOSE  intolerant.  DOXYCYCLINE allergy.  ERYTHROMYCIN AND TETRACYCLINE allergy.   I am here to let Dr. Graciela Husbands see all of my bruises.   Dwayne [name] is a 77 year old male.  He has a history of paroxysmal atrial  fibrillation.  He most recently underwent permanent pacemaker implantation  on September 5 for recurrent syncope secondary to atrial fibrillation  termination pauses.  It is known from the outset that he is exquisitely  sensitive to Coumadin.  His dose is 1 mg daily with 0.5 mg taken Wednesday  and Sunday.  His Coumadin had been held 3 days prior to admission and the  Coumadin dose was held after the pacemaker was implanted for 3 days.  However, over the 1 to 2 days after the pacemaker was implanted, he  developed a subcutaneous hematogenous spread, beginning at the left axilla,  spreading down the left side to the left flank, across the lumbar section of  the back, and across the chest at the level of the xiphoid.  Through all of  this, however, though it looks quite remarkable, he had minimal change in  his hemoglobin.   He presented to the office on September 7.  He was back in atrial  fibrillation.  He was hypotensive, dehydrated, and tachycardic.  He was  transferred to [facility] immediately to the emergency room.  His  Benicar was held.  Toprol XL 25 mg daily was changed to metoprolol 25 mg  b.i.d. and he was added on digoxin 0.125 mg daily.  The patient took digoxin  for 3 days and then stopped secondary to dizziness.  The  patient presented  to the office on September 11 with remarkable ecchymoses.  His INR was 2.1,  having started his Coumadin once again on September 10.  At that time, he  was asked to return on September 13 to see Dr. Graciela Husbands about the bruising.   On presentation to the office today, the patient's blood pressure is 158/84.  After standing for 5 minutes, it was 148/84 but dampened from very subdued.  The patient was seen by Dr. Graciela Husbands and the following plan was inaugurated:  The patient is to restart Benicar at 10 mg daily.  He is to hold his  Coumadin until he sees Dr. Riley Kill in the office on September 20.  There is  a question that the patient could be started on an antiarrhythmic drug.  This is for Dr. [name] and Dr. Shawnie Pons to decide and, because  the patient has indicated that he has  lower extremity swelling lately, there is a question that the patient might  benefit from a diuretic.  The patient is also encouraged to mobilize his  left shoulder.    This is a review of the medications that this patient is now taking as of  September 01, 2006.   MEDICATIONS:  1.  Aspirin 81 mg daily.  2.  Metoprolol 25 mg tablets b.i.d.  3.  Hold Coumadin until he sees Dr. Riley Kill in the office on September 20.  4.  Crestor 10 mg daily at bedtime.  5.  Benicar 10 mg daily (this is half of his usual 20 mg dose).  6.  Allegra 180 mg daily.  7.  Proscar 5 mg daily.  8.  Hytrin 5 mg daily at bedtime.  9.  Flonase one spray daily.  10. Hold digoxin which was started on September 8, which causes dizziness.   PAST MEDICAL HISTORY:  1. Hypercholesterolemia with good LV function.  2. Coronary artery disease, status post coronary artery bypass graft      surgery with subsequent intervention in the saphenous vein graft to the      obtuse marginal with stent placement in 2003.  His left ventricular      function is good.  3. Hypertension.  4. Benign prostatic hypertrophy.  5. Status post  removal of left renal cyst and left ureteral stricture.  6. History of plantar fascitis.  7. Right hemidiaphragmatic paralysis.  8. History of stress fracture to the left foot.  9. History of epistaxis requiring transfusions and packing.                                 Maple Mirza, PA                            Maisie Fus C. Daleen Squibb, MD, [facility]   GM/MedQ  DD:  09/01/2006  DT:  09/02/2006  Job #:  [id number]   cc:   Doylene Canning. Ladona Ridgel, MD  Alfonse Alpers Dagoberto Ligas, M.D.

## 2011-05-07 NOTE — Discharge Summary (Signed)
NAME:  Dwayne [name], Dwayne [name] NO.:  [id number]   MEDICAL RECORD NO.:  [id number]          PATIENT TYPE:  INP   LOCATION:  3705                         FACILITY:  [facility]   PHYSICIAN:  Joellyn Rued, P.A. [facility] DATE OF BIRTH:  November 15, 1932   DATE OF ADMISSION:  10/28/2005  DATE OF DISCHARGE:  10/30/2005                           DISCHARGE SUMMARY - REFERRING   ADMITTING PHYSICIAN:  Willa Rough, MD.   DISCHARGING PHYSICIAN:  Vida Roller, MD.   DISCHARGE DIAGNOSES:  1.  Epistaxis requiring packing.  2.  Elevated INR requiring 5 mg of Vitamin K and one unit of fresh frozen      plasma.  3.  History as listed below.   BRIEF HISTORY:  Dwayne [name] is a 77 year old white male, who presented to the  emergency room with a right-sided nosebleed, which became quite heavy  prompting his presentation.  He then developed left-sided nose bleeding that  resulted in packing.  His INR was noted to be 3.2.  From a cardiovascular  standpoint, he was asymptomatic.   PAST MEDICAL HISTORY:  1.  Bypass surgery in 1993 with a LIMA to the LAD, saphenous vein graft to      the OM-1, saphenous vein graft to the circumflex, saphenous vein graft      to the PA and PL.  Last catheterization was in 2003 when he was      hospitalized with a myocardial infarction.  2.  Hypertension.  3.  Hyperlipidemia.  4.  Atrial fibrillation for which he is on chronic Coumadin therapy.  5.  BPH.   LABORATORY DATA:  Admission H&H was 14.2 and 42.0, normal indices, platelets  311, WBC was 15.1.  At the time of discharge, H&H was 10.3 and 29.7, normal  indices, platelets 270, WBC is 17.9.  Admission PT was 32.6 with an INR of  3.2.  On the 11th at the time of discharge, PT was 22.0, INR of 1.9.  On the  10th, sodium was 142, potassium 3.6, BUN 47, creatinine 1.0, normal LFTs,  glucose 133.  Fasting lipids on the 10th were 123, 112, 36, and 65 for total  cholesterol, triglycerides, HDL, and LDL respectively.  Chest  x-ray was not  performed.  EKG was not performed.   HOSPITAL COURSE:  Dwayne [name] was admitted to 3700.  His Coumadin was held.  Dr. Ezzard Standing, ENT, was called for consultation in regards to his epistaxis.  On October 29, 2005, packing was removed from the right side, which showed  a large amount of clot, but no active bleeding.  Dr. Ezzard Standing could not tell  whether this was an anterior or posterior nosebleed.  Several anteroseptal  vessels were cauterized and repacked.  Dwayne [name] received 5 mg of Vitamin K  on November 10th with a decrease of his INR.  His Coumadin was resumed at 1  mg at the time of discharge.  It was noted at the time of discharge on  review of telemetry he maintained a normal sinus rhythm.  However, on  October 28, 2005, he had a brief episode  of atrial fibrillation with a rapid  ventricular rate.   DISPOSITION:  Dwayne [name] was asked to maintain a low-sat, fat, cholesterol  diet.  He was asked to resume his aspirin 81 mg p.o. daily, Toprol-XL 25 mg  p.o. daily, Benicar 20 mg daily, Crestor 5 mg daily, Hytrin 2 mg daily,  Allegra 180 mg daily, Proscar 5 mg daily, Citrucel as previously, Coumadin 1  mg every day to start on Sunday.  He was  asked to call the Coumadin Clinic on Monday to arrange followup PT-INR for  Monday or Tuesday.  He was advised not to use his Flonase until discussed  with Dr. Ezzard Standing as this can precipitate nosebleeds.  He was asked to call  Dr. Allene Pyo office to arrange followup.  He will see Dr. Riley Kill as  previously scheduled.      Joellyn Rued, P.A. [facility]     EW/MEDQ  D:  10/30/2005  T:  10/31/2005  Job:  [id number]   cc:   Alfonse Alpers. Dagoberto Ligas, M.D.  Fax: [phone]   Arturo Morton. Riley Kill, M.D. [facility]  [address]  [address]  [place]  [place] [address]   Kristine Garbe. Ezzard Standing, M.D.  Fax: [phone]

## 2011-05-07 NOTE — H&P (Signed)
NAME:  Dwayne [name], Dwayne [name] NO.:  [id number]   MEDICAL RECORD NO.:  [id number]          PATIENT TYPE:  OIB   LOCATION:  3712                         FACILITY:  [facility]   PHYSICIAN:  Madolyn Frieze. Jens Som, MD, FACCDATE OF BIRTH:  [birth year]   DATE OF ADMISSION:  08/24/2006  DATE OF DISCHARGE:  08/25/2006                                HISTORY & PHYSICAL   PRIMARY CARE PHYSICIAN:  Alfonse Alpers. Dagoberto Ligas, M.D.   PRIMARY CARDIOLOGIST:  Arturo Morton. Riley Kill, MD, [facility]   PRIMARY ELECTROPHYSIOLOGIST:  Melissa L. Ladona Ridgel, MD   CHIEF COMPLAINT:  Dizziness.   HISTORY OF PRESENT ILLNESS:  Mr. Ojeda is a very pleasant 77 year old male  patient followed by Dr. Riley Kill with history of coronary disease status post  CABG and status post intervention to the vein graft of the obtuse marginal  circumflex in 2003 as well as paroxysmal atrial fibrillation with tachybrady  syndrome who recently underwent pacemaker implantation on August 24, 2006.  He was discharged yesterday from the hospital.  He called the office today  with complaints of pain around his pacemaker site as well as swelling in his  arms and weakness and dizziness.  We asked him to come in today for further  evaluation.  In the office, his arms are actually not swollen.  However, he  is significantly dizzy, especially when he stands.  His pressures are also  quite low and we decided to admit him for hydration and to treat his  hypotension.  According to his records, he always has some hypotension when  he goes into atrial fibrillation.  Currently, in the office, he is in atrial  fibrillation with heart rate of 109.  He has had some right upper back pain.  He notes that he has not had a bowel movement in three days and feels this  way at times when he does have problems with constipation.  He also thinks  he pulled a muscle in his back a couple weeks ago.  He denies any substernal  chest pressure.  Denies any changes in shortness of  breath.  Denies any  syncope.  Denies any orthopnea or paroxysmal nocturnal dyspnea.   PAST MEDICAL HISTORY:  1. Significant for paroxysmal atrial fibrillation.  2. Tachybrady syndrome.  3. Status post permanent pacemaker August 24, 2006.  4. Coronary artery disease status post CABG with history of intervention      to the vein graft of the obtuse marginal circumflex in 2003.  5. Good LV function.  6. Hypercholesterolemia.  7. Hypertension.  8. Benign prostatic hypertrophy.  9. History of abnormal chest CT followed by Dr. Delton Coombes.  10.History of allergic rhinitis and chronic sinusitis status post sinus      surgery.  11.Status post removal of left renal cyst and left ureteral stricture.  12.Status post Targis procedure in 2000.  13.History of epistaxis, requiring transfusions and packing.  14.Plantar fasciitis.  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  16.History of stress fracture to the left foot.  17.He had fiberoptic bronchoscopy performed in the 1980's which was  reportedly unremarkable.   CURRENT MEDICATIONS:  1. Aspirin 81 mg daily.  2. Metoprolol ER 25 mg daily.  3. Citracal D, currently on hold.  4. Warfarin, currently on hold until Sunday, August 28, 2006.  5. Crestor 10 mg daily.  6. Benicar 20 mg daily.  7. Allegra 180 mg daily.  8. Proscar 5 mg daily.  9. Hytrin 5 mg daily.  10.Flonase daily.   ALLERGIES:  Multiple drug allergies including:  PENICILLIN, SULFA, FLAGYL,  __________, CAFFEINE, CLINDAMYCIN, LACTOSE INTOLERANT, DOXYCYCLINE,  ERYTHROMYCIN, TETRACYCLINE.   SOCIAL HISTORY:  He is a nonsmoker.   FAMILY HISTORY:  Noncontributory.   REVIEW OF SYSTEMS:  Please see HPI.  He does note some burning when he  urinates.  Denies any fevers or chills.  Denies any cough.  Denies any  hematuria.  As noted above, he has been constipated.  Rest of review of  systems are negative.   PHYSICAL EXAMINATION:  GENERAL:  Well-nourished,  well-developed male in no  acute distress.  VITAL SIGNS:  Blood pressures:  Lying 93/62, pulse 87; sitting 91/63, pulse  126; standing 79/55 with pulse 109; after 2 minutes; 79/55 with pulse of  112; after 5 minutes 80/53 with a pulse of 108.  HEENT:  Normocephalic, atraumatic.  Eyes:  PERRLA.  EOMI.  Sclerae are  clear.  NECK:  Without JVD.  CARDIAC:  Normal S1, S2.  Regular rate and rhythm.  LUNGS:  Clear to auscultation bilaterally.  ABDOMEN:  Soft, nontender.  EXTREMITIES:  Without edema. Calves are soft and nontender.  CHEST:  Left chest:  His pacemaker site is healing well without any  hematoma.  There is no erythema or discharge.  He does have large extensive  ecchymosis inferior to the pacer site covering most of his left breast.  NEUROLOGICAL:  Alert and oriented x3.  Cranial nerves II-XII area grossly  intact.  MUSCULOSKELETAL:  Moves all extremities well.   STUDIES:  Atrial fibrillation with heart rate of 109, no ischemic changes.   IMPRESSION:  1. Hypertension.  2. Atrial fibrillation without rapid ventricular rate.  3. Tachybrady syndrome status post pacemaker implantation September 5,      20 07, Medtronic dual-chamber pacemaker.  4. Coronary artery disease status post CABG.  Status post non-ST elevation      myocardial infarction December 2003, treated with intervention of the      vein graft to obtuse marginal circumflex.  5. Good LV function.  6. Hyperlipidemia.  7. History of hypertension.  8. Benign prostatic hypertrophy.  9. History of abnormal chest CT followed by Dr. Delton Coombes.  10.Constipation.  11.Dysuria.   PLAN:  The patient was also being seen by Dr. Jens Som.  Plan to admit him  to the hospital for hydration for his hypotension.  He is also complaining  of some right lower back pain and dysuria.  Will check a urinalysis and CBC.  Will also get a C-Met on him.  Will check Cardiac enzymes as well as a chest x-ray.  Will hold all of his blood pressure  medications.  May need to add  digoxin for his rate control of his atrial fibrillation.  At this point in  time we are unable to cardiovert him secondary to not being on  anticoagulation secondary to recent pacemaker implantation.  As noted above,  his Coumadin is due to restart on Sunday September 9.     ______________________________  Tereso Newcomer, PA-C    ______________________________  Madolyn Frieze. Jens Som, MD, [facility]  SW/MEDQ  D:  08/26/2006  T:  08/26/2006  Job:  [id number]   cc:   Alfonse Alpers. Dagoberto Ligas, M.D.

## 2011-05-07 NOTE — Discharge Summary (Signed)
NAME:  Dwayne [name], Dwayne [name] NO.:  [id number]   MEDICAL RECORD NO.:  [id number]                   PATIENT TYPE:  INP   LOCATION:  3727                                 FACILITY:  [facility]   PHYSICIAN:  Joellyn Rued, P.A. [facility]              DATE OF BIRTH:  Oct 24, 1932   DATE OF ADMISSION:  11/30/2002  DATE OF DISCHARGE:  12/03/2002                           DISCHARGE SUMMARY - REFERRING   HISTORY OF PRESENT ILLNESS:  The patient is a 77 year old white male who  visits with Dr. Riley Kill annually.  During his last visit in January 2003 he  had a normal stress test, however, now presents with progressive dyspnea on  exertion and angina over the preceding 24 hours with minimal exertion, i.e.  pulling garbage cans from the end of the driveway.  His history is notable  for five vessel bypass grafting in 1993 with left internal mammary artery to  left anterior descending, saphenous vein graft to obtuse marginal and  circumflex, saphenous vein graft to the posterior descending artery and PL.  At the time of catheterization in 1993 his left ventricular function was  normal.   PAST MEDICAL HISTORY:  The patient is known to have MULTIPLE ALLERGIES,  benign prostatic hypertrophy, hyperlipidemia, hypertension, paralyzed  diaphragm on one side.   LABORATORY DATA:  Admission hemoglobin was 15.4 and hematocrit was 45.4,  normal indices, platelet count 276,000, white blood cell count 12.3, normal  indices. Sodium 141, potassium 4.1, BUN 19, creatinine 1.1, glucose 95.  Subsequent chemistries are essentially unremarkable.  Subsequent  hematologies were unremarkable. Admission ProTime was 13.3, PTT 31.  Liver  function tests were within normal limits.  Admission CK total 748 with MB of  13.2, relative index 1.8 and troponin 0.02.  Second CK was 237 with MB 3.9,  relative index was 1.6 and troponin 0.09.  Subsequently CK and total MB's  were declining.   Electrocardiograms showed  sinus bradycardia, normal sinus rhythm.  Early R  wave, J point elevation anterolaterally.   HOSPITAL COURSE:  The patient was admitted for cardiac catheterization.  This was performed by Dr. Riley Kill.  According to Dr. Rosalyn Charters  catheterization progress note the patient had native three vessel coronary  artery disease. His left internal mammary artery to the left anterior  descending was patent.  His saphenous vein graft to the obtuse marginal and  circumflex had a 90% proximal occlusion.  The saphenous vein graft to the  posterior descending artery and PLA was patent with a distal 70% lesion in  the posterior descending artery branch.  Dr. Riley Kill performed angioplasty  stenting to the ostium of the saphenous vein graft to the obtuse marginal  and circumflex utilizing a Guidant stent.  During sheath removal the patient  became slightly diaphoretic and hypotensive.  This responded with fluids.  After review by Dr. Riley Kill and Dr. Myrtis Ser it was felt that the  patient had  had a non-Q-wave myocardial infarction.  By December 02, 2002 the patient  was doing much better and had begun ambulating.  On December 03, 2002 Dr.  Riley Kill felt the patient could be discharged home after he was seen by  cardiac rehabilitation.   DISCHARGE DIAGNOSES:  1. Non-Q-wave myocardial infarction, status post angioplasty with stenting     of saphenous vein graft to the obtuse marginal and circumflex as     previously described.  2. History as previously.   DISPOSITION:  Patient is discharged home.   DISCHARGE MEDICATIONS:  Patient received a new prescription for Plavix 75 mg  q.d. for one month.  He is asked to continue his coated aspirin 325 mg p.o.  q.d., Proscar 5 mg q.d., Hytrin 10 mg q.d., Benicar 20 mg q.d., Lipitor 10  mg q.h.s., nitroglycerin 0.4 mg as needed and Allegra 180 mg q.d.   ACTIVITY:  Patient was advised no lifting, driving, sexual activity or heavy  exertion until he is seen back by his  doctor in approximately two weeks.   DIET:  The patient is to maintain a low fat, low salt, low cholesterol diet.   FOLLOW UP:  If the patient has any problems with the catheterization site he  is to asked to call immediately.  He will see Dr. Uvaldo Rising. on January  7, at 3:00 p.m.                                               Joellyn Rued, P.A. [facility]    EW/MEDQ  D:  12/03/2002  T:  12/03/2002  Job:  [id number]

## 2011-05-07 NOTE — Consult Note (Signed)
NAME:  Dwayne [name], Dwayne [name] NO.:  [id number]   MEDICAL RECORD NO.:  [id number]          PATIENT TYPE:  EMS   LOCATION:  MAJO                         FACILITY:  [facility]   PHYSICIAN:  Jonelle Sidle, M.D. LHCDATE OF BIRTH:  Dec 13, 1932   DATE OF CONSULTATION:  DATE OF DISCHARGE:  09/28/2005                                   CONSULTATION   PRIMARY CARDIOLOGIST:  Arturo Morton. Riley Kill, M.D. [facility]   PRIMARY CARE PHYSICIAN:  Alfonse Alpers. Dagoberto Ligas, M.D.   REASON FOR CONSULTATION:  Recurrent atrial fibrillation.   HISTORY OF PRESENT ILLNESS:  Dwayne [name] is a pleasant 77 year old male with a  history of coronary artery disease status post coronary artery bypass  grafting as well as subsequent stent placement to the saphenous vein graft  to the obtuse marginal and circumflex system.  Additional history includes  dyslipidemia, hypertension, and recently diagnosed paroxysmal atrial  fibrillation.  The patient has been followed closely as an outpatient by Dr.  Riley Kill and is now enrolled in the Coumadin clinic.  Relatively new  medications since September include both Coumadin and Toprol-XL.   Dwayne [name] states that he awoke this morning and checked his blood pressure as  is his routine.  He states that his blood pressure was somewhat low for  him, reporting his systolic to be 140, and based on this, he decided to  check his pulse, noting it to be irregular.  He came to the emergency  department thinking that he may be in atrial fibrillation, remembering that  he was told to be evaluated if he had recurrent events.  He denies  specifically having any chest pain or shortness of breath.  He perhaps felt  a mild degree of palpitations and lightheadedness but nothing severe or  prolonged and in fact is asymptomatic on my exam at this time.  He is in  atrial fibrillation with heart rates in the 70s-80s and systolic blood  pressure in the 120-130 range in the emergency department.  12-lead  electrocardiogram shows atrial fibrillation at 74 beats per minute, with  increased voltage and small lateral Q waves as well as evidence of previous  inferior wall infarction.  Recent blood work shows a TSH of 1.47 from  September 20, 2005, and the patient tells me that his INR was 2.4 yesterday in  the Coumadin clinic with instructions to continue 1 mg Coumadin per day.  Dwayne [name] has a followup visit with Dr. Riley Kill this Friday in our clinic.  He is eager to go home today.   ALLERGIES:  PENICILLIN, SULFA DRUGS, FLAGYL, AND QUESTRAN.   CURRENT MEDICATIONS:  1.  Aspirin 81 mg p.o. daily.  2.  Toprol-XL 25 mg p.o. daily.  3.  Benicar 40 mg p.o. b.i.d.  4.  Coumadin 1 mg p.o. daily.  5.  Crestor 5 mg p.o. daily.  6.  Hytrin 2 mg p.o. daily.  7.  Allegra 180 mg p.o. daily.  8.  Proscar 5 mg p.o. daily.  9.  Flonase as directed.   PAST MEDICAL HISTORY:  Reviewed.  Pertinent  issues are discussed in the  history of present illness.   SOCIAL HISTORY:  Reviewed.  Pertinent issues are discussed in the history of  present illness.  The patient is a nonsmoker.   FAMILY HISTORY:  Reviewed.  Pertinent issues are discussed in the history of  present illness.   REVIEW OF SYSTEMS:  As described in the history of present illness.  He is  exercising in the maintenance cardiac rehabilitation program and denies  having any anginal symptoms or progressive shortness of breath.  He has not  had any problems with frank prolonged palpitations and has had no syncope.  He is having no reported bleeding problems on Coumadin.  He states that he  is in general sensitive to medications.  He experienced fatigue initially  when being placed on beta-blocker therapy, although these symptoms have  improved somewhat.  All other systems are negative.   PHYSICAL EXAMINATION:  VITAL SIGNS:  Temperature 97.1 degrees, heart rate in  the 70s presently and in atrial fibrillation, respirations 18, blood  pressure 142/77,  down to 123/90.  Oxygen saturation is 96% on room air.  GENERAL:  This is a well-nourished elderly male seated in bed in no acute  distress.  NECK:  No elevated jugular venous pressure.  No carotid bruits.  No  thyromegaly is noted.  LUNGS:  Clear, without labored breathing at rest.  No wheezing noted.  CARDIAC:  Irregularly irregular rhythm without loud murmur or pericardial  rub or S3 gallop.  ABDOMEN:  Soft with normoactive bowel sounds.  No hepatomegaly is noted.  EXTREMITIES:  No pitting edema.  Peripheral pulses are 1-2+.   LABORATORY DATA:  Discussed in the history of present illness, with recent  INR on September 27, 2005 of 2.4.  No additional lab work drawn today.   IMPRESSION:  1.  Paroxysmal atrial fibrillation, rate controlled and hemodynamically      stable.  The patient is on Toprol-XL 25 mg by mouth daily and Coumadin 1      mg by mouth daily, with a therapeutic internation normalized ratio of      2.4 from yesterday in the Coumadin clinic.  He is denying any active      sense of palpitations, chest pain, or shortness of breath.  2.  Known coronary artery disease, status post previous coronary artery      bypass grafting, with subsequent intervention to the saphenous vein      graft to the circumflex/obtuse marginal system.  The patient recently      underwent an exercise treadmill test on September 15, 2005 under the      direction of Dr. Riley Kill which was negative for ischemia.  He also had a      recent echocardiogram, results of which are not available at the      present.  3.  Hypertension.  4.  Hyperlipidemia, on statin therapy.   RECOMMENDATIONS:  1.  I discussed the situation with the patient, his wife, and by phone with      Dr. Riley Kill.  Dwayne [name] is eager to go home, and I have suggested that he      be discharged, with plans to keep his followup visit with Dr. Riley Kill on     Friday, assuming he continues to remain essentially asymptomatic.  It is       certainly possible that the atrial fibrillation will have resolved by      then.  If not, consideration can be  given to additional medication      adjustments and/or cardioversion.  2.  Continue present medical regimen.  I did briefly discuss advancing beta-      blocker therapy with Mr. Kille; however, at this point he is hesitant to      make any changes in his medications given his relative drug      sensitivities.           ______________________________  Jonelle Sidle, M.D. [facility]     SGM/MEDQ  D:  09/28/2005  T:  09/28/2005  Job:  [id number]   cc:   Arturo Morton. Riley Kill, M.D. [facility]  [address]  [address]  [place]  [place] [address]   Alfonse Alpers. Dagoberto Ligas, M.D.  Fax: [phone]

## 2011-05-07 NOTE — H&P (Signed)
NAME:  Dwayne [name], Dwayne [name] NO.:  [id number]   MEDICAL RECORD NO.:  [id number]                   PATIENT TYPE:  EMS   LOCATION:  MAJO                                 FACILITY:  [facility]   PHYSICIAN:  Pricilla Riffle, M.D. [facility]             DATE OF BIRTH:  December 26, 1931   DATE OF ADMISSION:  11/30/2002  DATE OF DISCHARGE:                                HISTORY & PHYSICAL   PRIMARY CARDIOLOGIST:  Arturo Morton. Riley Kill, M.D. [facility]   IDENTIFICATION:  The patient is a 77 year old with known CAD who presents  for evaluation of progressive chest pain.   HISTORY OF PRESENT ILLNESS:  The patient's history of coronary artery  disease dates back to the early 1990s.  He is status post CABG in 1993,  (LIMA to LAD, SVG to OM, SVG to left circumflex, SVG to PD/PLSA).  He has  been seen regularly in clinic, and he had a Cardiolite last done in January  2003, which was normal.   Over the past several weeks, the patient has noted some increased shortness  of breath with exertion, particularly over the past 24-48 hours.  He also  notes some chest pressure with minor exertion. This morning, he was putting  out the garbage cans and developed chest pressure across his chest that  eased off with rest.  The patient notes when he goes to rehabilitation, he  has also had some increasing dyspnea on exertion which he did not have a few  months ago.   The patient has been somewhat restless.  His mood has been irritable which  again he noted prior to having his initial heart problems back in the early  1990s.  The patient denies any PND.  No increased orthopnea, no  palpitations.   ALLERGIES:  SULFA, PENICILLIN, __________ ,THORAZINE, CAFFEINE, NUTRA-SWEET,  CLINDAMYCIN, DOXYCYCLINE, and ERYTHROMYCIN.   PAST MEDICAL HISTORY:  1. CAD.  2. Dyslipidemia.  3. Hypertension.  4. BPH.  5. 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   CURRENT MEDICATIONS:  1. Benicar 20 daily  2. Aspirin  325 daily  3. Hytrin 10 mg daily  4. Lipitor 10 mg daily  5. Allegra 180 daily  6. Provascar 5 daily   SOCIAL HISTORY:  The patient is married with four children.  Does not smoke,  does not drink.  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.   FAMILY HISTORY:  Father died of CVA at 70.  Otherwise, unknown.   REVIEW OF SYSTEMS:  Overall reviewed, negative except as stated.   PHYSICAL EXAMINATION:  GENERAL:  The patient is currently in no acute  distress.  Denies chest pain.  VITAL SIGNS:  Blood pressure 168/98, pulse 81, respiratory rate 20, O2  saturation on room air 96%.  NECK:  JVP normal.  LUNGS:  Clear.  CARDIAC:  Regular rate and rhythm.  Normal S1 and S2, no  S3 or S4.  No  significant murmurs.  ABDOMEN:  Exam benign.  EXTREMITIES:  No edema, 2+ distal pulses.   LABORATORY DATA:  Hemoglobin 17.  BUN 19, creatinine 1.1, potassium 4.1.   A 12 lead EKG showed normal sinus rhythm with rate of 79 bpm, first degree  AV block.   IMPRESSION:  The patient is a 77 year old status post coronary artery bypass  grafting in 1993, with history concerning for progressive anginal symptoms  with normal activity.  The patient presented himself for this to the  emergency room.  Currently pain free, electrocardiogram without acute  changes.  Still, with known history and typical symptoms, we recommend  cardiac catheterization.  The patient understands and agrees to proceed.                                               Pricilla Riffle, M.D. [facility]    PVR/MEDQ  D:  11/30/2002  T:  11/30/2002  Job:  [id number]   cc:   Arturo Morton. Riley Kill, M.D. [facility]  [address]  [place]  [place] [address]  Fax: 1

## 2011-05-07 NOTE — Assessment & Plan Note (Signed)
[facility]                              CARDIOLOGY OFFICE NOTE   NAME:[name], Dwayne KALMBACH                         MRN:          [id number]  DATE:09/01/2006                            DOB:          08-31-1932    This is a review of the medications that this patient is now taking as of  September 01, 2006.   MEDICATIONS:  1. Aspirin 81 mg daily.  2. Metoprolol 25 mg tablets b.i.d.  3. Hold Coumadin until he sees Dr. Riley Kill in the office on September 20.  4. Crestor 10 mg daily at bedtime.  5. Benicar 10 mg daily (this is half of his usual 20 mg dose).  6. Allegra 180 mg daily.  7. Proscar 5 mg daily.  8. Hytrin 5 mg daily at bedtime.  9. Flonase one spray daily.  10.Hold digoxin which was started on September 8, which causes dizziness.   PAST MEDICAL HISTORY:  1. Hypercholesterolemia with good LV function.  2. Coronary artery disease, status post coronary artery bypass graft      surgery with subsequent intervention in the saphenous vein graft to the      obtuse marginal with stent placement in 2003.  His left ventricular      function is good.  3. Hypertension.  4. Benign prostatic hypertrophy.  5. Status post removal of left renal cyst and left ureteral stricture.  6. History of plantar fascitis.  7. Right hemidiaphragmatic paralysis.  8. History of stress fracture to the left foot.  9. History of epistaxis requiring transfusions and packing.                                 Dwayne Mirza, PA                            Dwayne Fus C. Daleen Squibb, MD, [facility]   GM/MedQ  DD:  09/01/2006  DT:  09/02/2006  Job #:  [id number]   cc:   Dwayne Canning. Ladona Ridgel, MD  Dwayne [name], M.D.

## 2011-05-07 NOTE — Discharge Summary (Signed)
NAME:  CALOB, BASKETTE NO.:  [id number]   MEDICAL RECORD NO.:  [id number]          PATIENT TYPE:  OIB   LOCATION:  3712                         FACILITY:  [facility]   PHYSICIAN:  Maple Mirza, PA   DATE OF BIRTH:  06/24/1932   DATE OF ADMISSION:  08/24/2006  DATE OF DISCHARGE:  08/25/2006                                 DISCHARGE SUMMARY   ALLERGIES:  SULFA, ERYTHROMYCIN, CLINDAMYCIN, PENICILLIN, FLAGYL,  DOXYCYCLINE.   PRINCIPAL DIAGNOSES:  1. Discharging day #1 status post implant Medtronic ADAPTA dual-chamber      pacemaker.  2. Tachybrady syndrome.  3. Syncope, question of post atrial fibrillation termination pauses.  4. Paroxysmal atrial fibrillation/chronic Coumadin (extremely sensitive to      Coumadin).   SECONDARY DIAGNOSIS:  1. Coronary artery disease.      a.     History of non-ST elevation myocardial infarction.      b.     Status post coronary artery bypass graft surgery.      c.     Status percutaneous coronary intervention to the reverse       saphenous vein graft to obtuse marginal 2003.  2. Dyslipidemia.  3. Hypertension.  4. Benign prostatic hypertrophy.   PROCEDURE:  August 24, 2006:  Implant Medtronic ADAPTA dual-chamber  pacemaker, Dr. [name].  Patient had ecchymotic changes to the left  axilla after the procedure but no hematoma.   BRIEF HISTORY:  Mr. Drew is 77 year old male.  He has a history of coronary  artery disease, dyslipidemia, atrial fibrillation which is paroxysmal.   The patient feels that he can tell when he is in and out of his atrial  fibrillation, and there are points in time when he feels that he goes back  to normal rhythm and loses consciousness.  Typically his pulse in atrial  fibrillation varies between 80 and 100 beats a minute.  When he is in sinus  rhythm it is about 60 beats a minute.  The patient has a history of sinus  bradycardia with heart rates in the low 50s despite only minimal AV nodal  blocking or sinus nodal blocking drugs.   Mr. Seifer continues to have frequent episodes of presyncope, and it is  thought perhaps that he is continuing to have post-termination pauses from  his atrial fibrillation.  From all the above and because he has documented  signs of bradycardia, it is recommended that he undergo permanent pacemaker  insertion.  Risks and benefits, goals and expectations have been described  to the patient and has wife and they desire to proceed.   BRIEF HOSPITAL COURSE:  The patient presented electively on August 24, 2006.  He underwent successful implantation of an ADAPTA Medtronic dual-  chamber pacemaker.  He has had no problem with functioning of the pacemaker  on interrogation after the procedure.  Chest x-ray has been examined, and  the leads are in appropriate position.  The patient has ecchymoses in the  left axilla without crepitus, and because of this his Coumadin will be held  for the  next 3 days.  The patient's incision is healing well, and there is  no hematoma.  On telemetry the patient shows that he is in sinus rhythm.   DISCHARGE DIET:  Low-sodium, low-cholesterol diet.   DISCHARGE INSTRUCTIONS:  1. He is asked not to drive for the next week.  2. He is asked not to lift anything heavier than 10 pounds for the next 4      weeks.  3. He is to keep his incision dry for the next 7 days and to sponge bathe      until Wednesday, August 31, 2006.   DISCHARGE MEDICATIONS:  He discharges on the following medications.  1. Enteric-coated aspirin 81 mg daily.  2. Metoprolol  ER 25 mg daily.  3. Crestor 10 mg daily at bedtime.  4. Benicar 20 mg daily.  5. Allegra 180 mg daily.  6. Proscar 5 mg daily.  7. Hytrin 5 mg daily at bedtime.  8. Flonase 1 spray daily.  9. Coumadin, he is to hold on Thursday, Friday, Saturday and to resume at      1 mg on Sunday, August 28, 2006, and continue his 1 mg daily except      for 0.5 mg Wednesday and  Sunday.   FOLLOWUP:  He has followup at [facility], [address]  1. Coumadin clinic:  Tuesday, August 30, 2006 at 11:15 a.m. in the      morning.  2. Pacer clinic:  Wednesday, September 07, 2006 at 10 a.m.  3. To see Dr. Riley Kill, Thursday, September 08, 2006 at 2:30 p.m.  4. To see Dr. Ladona Ridgel, Tuesday, December 06, 2006 at 10 a.m. in the      morning.   LABORATORY STUDIES ON THE DAY OF DISCHARGE:  August 25, 2006:  PT was 27,  INR 2.4.  Other labs pertinent to this admission were drawn August 19, 2006:  Sodium 140, potassium 3.9, chloride 110, carbon dioxide 26, glucose 91, BUN  is 14, creatinine 1.1.  The INR on August 19, 2006 is 2.2.  Complete blood  count was white cells 12.3, hemoglobin 12.6, hematocrit 37.1 and platelets  273.   This is a 30-minute discharge.           ______________________________  Maple Mirza, PA     GM/MEDQ  D:  08/25/2006  T:  08/25/2006  Job:  [id number]   cc:   Doylene Canning. Ladona Ridgel, MD  Alfonse Alpers Dagoberto Ligas, M.D.  Arturo Morton. Riley Kill, MD, [facility]

## 2011-05-07 NOTE — Assessment & Plan Note (Signed)
[facility]                            CARDIOLOGY OFFICE NOTE   NAME:[name], [name]                         MRN:          [id number]  DATE:12/01/2006                            DOB:          1932-08-17    Dwayne [name] is in for followup. He went to rehab this morning. He felt like he  was in atrial fibrillation and in fact, he was. He now feels somewhat  better. He has not been having any ongoing chest pain. He did not feel  light-headed and ever since he has had his pacemaker it has dramatically  improved his symptomatic response to paroxysmal atrial fibrillation. He  is not in favor, nor have we been of adding anti-arrhythmic therapy to  his regimen as he tolerates this well now with pacing.   PHYSICAL EXAMINATION:  The blood pressure is 124/72, pulse is 70.  The  lung fields are clear.  The cardiac rhythm is regular.   His strips from earlier show atrial fibrillation with controlled  ventricular response and his current strips show predominantly  underlying fib/flutter with ventricular pacing.   Overall, Dwayne [name] is improved. He has not been having any recurrent angina.  At the present time, we will continue him on current medical regimen. He  was following up with Dr. Dagoberto Ligas. We will see him back in followup in a  month or two to reassess his status. He will remain on warfarin  anticoagulation chronically.     Arturo Morton. Riley Kill, MD, [facility]  Electronically Signed    TDS/MedQ  DD: 12/01/2006  DT: 12/01/2006  Job #: [id number]

## 2011-05-07 NOTE — Assessment & Plan Note (Signed)
[facility]                            CARDIOLOGY OFFICE NOTE   [name], Dwayne [name]                         MRN:          [id number]  DATE:04/03/2007                            DOB:          1932/10/18    Mr. Ende is in for a followup visit. In general, from a cardiac  standpoint, he has stabilized. His INR is now at 2. He needs to have  back injections. Apparently, this is going to be done at [facility]. I  told him that he would need to stop Coumadin about 5 days in advance and  get an INR check at 4 days. I told him that there is a slight increase  in risk with discontinuation of Coumadin, but that if it was necessary  for the procedure, there are no major cardiac limitations for the  procedure in and of itself. He has not been out of rhythm much and he  actually feels quite well.   His blood pressure is 130/84. The pulse is 87.  Lung fields are actually quite clear.  CARDIAC: Is regular with an occasional skip.   EKG reveals atrial ventricular pacing.   IMPRESSION:  1. Coronary artery disease with prior percutaneous coronary      intervention and known re-vascular surgery.  2. Recurrent atrial fibrillation.  3. Status post permanent pacer implant.  4. Hypercholesterolemia, on lipid-lowering therapy.   PLAN:  1. Continue current medical regimen.  2. Return to clinic in approximately 3 months.  3. Specific instructions given regarding his anticoagulation.     Arturo Morton. Riley Kill, MD, [facility]  Electronically Signed    TDS/MedQ  DD: 04/03/2007  DT: 04/03/2007  Job #: [id number]   cc:   Alfonse Alpers. Dagoberto Ligas, M.D.  [name]. Cleophas Dunker, M.D.

## 2011-05-07 NOTE — Assessment & Plan Note (Signed)
[facility]                              CARDIOLOGY OFFICE NOTE   NAME:[name], Dwayne GILMER                         MRN:          [id number]  DATE:07/05/2006                            DOB:          [birth year]    Dwayne [name] is added on to the schedule today.  He felt a little bit  lightheaded earlier.  About 3 o'clock in the morning he developed appeared  to be clinically atrial fibrillation.  He knows he is in atrial  fibrillation.  He went to the Coumadin clinic today where his INR was 2.9.  Repeat blood pressure here was about 110/70 and the pulse was 75.  The  patient goes in and out at times, and he can clearly tell when he is out of  rhythm.  The patient has seen Dr. [name], who recommended watchful  waiting with potentially back-up pacing as necessary.  Dwayne [name] and I have  also discussed the possibility of atrial fibrillation ablation, although  this is something we would clearly like to avoid.   Today in the clinic, blood pressure is 110/70 and pulse is 75.  EKG does  reveal atrial fibrillation with controlled ventricular response.  Some beats  appear as though they are sinus, but think in actual fact probably this  represents atrial fibrillation.  At the present time, I have discussed this  case again with Dr. Ladona Ridgel.  Unless he develops increasing trouble, we will  probably lean towards continued low dose beta-blockade along with low dose  Coumadin.  He will remain with predominant anticoagulation.  Should this  fail, we will need to consider other options.                              Arturo Morton. Riley Kill, MD, [facility]    TDS/MedQ  DD:  07/25/2006  DT:  07/25/2006  Job #:  [id number]

## 2011-05-07 NOTE — Assessment & Plan Note (Signed)
[facility]                           ELECTROPHYSIOLOGY OFFICE NOTE   NAME:Placzek, ETHANIEL GARFIELD                         MRN:          [id number]  DATE:08/03/2006                            DOB:          1932/02/15    Mr. Tromp returns today for a follow up.  He is a very pleasant patient of  Dr. Riley Kill who has a history of coronary artery disease, history of  dyslipidemia, history of atrial fibrillation which has been paroxysmal in  the past.  I saw him most recently back in April for evaluation of  paroxysmal AFib.  At that time, he related symptoms of post-termination  pauses, although these had not been quite documented.  He noted that at  times he would feel himself be out of rhythm in AFib and then have a sudden  period where he would lost consciousness, very briefly by his report, and  then afterwards he would note that he was back in normal rhythm based on  findings from his automated blood pressure machine.  Typically, his pulse  rate in AFib, varies between 80 and 100 beats per minute, and when he is in  sinus rhythm, is around 60 beats per minute.  The patient does have a  history of sinus bradycardia and with documented heart rates in the low 50s  despite minimal (metoprolol 25 mg daily) AV nodal blocking or sinus nodal  blocking drugs.   PHYSICAL EXAMINATION:  GENERAL:  On physical exam today, he is a pleasant  elderly-appearing man in no acute distress.  VITAL SIGNS:  The blood pressure was 126/68.  The pulse was 68 and regular.  Respirations were 18.  The weight was 205 pounds.  NECK:  The neck revealed no jugular venous distention.  LUNGS:  Clear bilaterally to auscultation.  CARDIOVASCULAR:  Exam revealed a regular rate and rhythm with normal S1 and  S2.  EXTREMITIES:  Demonstrated no edema.   EKG from past clinic visits done by Dr. Riley Kill demonstrate sinus rhythm  with normal axis and intervals.  Other ECGs in the past demonstrated  atrial  fibrillation.   IMPRESSION:  1. Paroxysmal atrial fibrillation.  2. Chronic Coumadin therapy.  3. Tachycardia/bradycardia syndrome with symptomatic post-termination      pauses.   DISCUSSION:  Mr. Mciver continues to have more frequent episodes of near  syncope and I suspect he is continuing have post-termination pauses from his  AFib based on his nice documentation of his history.  For all the above and  because he has documented signs of bradycardia, I have recommended that he  undergo permanent pacemaker insertion; and the risks, benefits, goals and  expectations, and the procedure have been discussed with him.  I will  discuss these with Dr. Riley Kill, and we will tentatively plan to schedule the  procedure in the next several weeks.                                   Doylene Canning. Ladona Ridgel, MD   GWT/MedQ  DD:  08/03/2006  DT:  08/03/2006  Job #:  [id number]   cc:   Alfonse Alpers. Dagoberto Ligas, MD

## 2011-05-07 NOTE — Assessment & Plan Note (Signed)
[facility]                            CARDIOLOGY OFFICE NOTE   NAME:Newland, NICKALUS THORNSBERRY                         MRN:          [id number]  DATE:01/04/2007                            DOB:          Sep 09, 1932    Mr. Malone is in for follow up.  He has had some bronchitis and been  started on Zithromax by Dr. Dagoberto Ligas.  He has little bit of shortness of  breath, but he has not been having significant underlying symptoms.   Today on exam the blood pressure is 160/90 and on recheck was 140/80,  the pulse was 75.  The lung fields are really quite clear despite his  noted bronchitis.  His cardiac rhythm is regular with a soft systolic  ejection murmur.   The electrocardiogram demonstrates normal sinus rhythm with occasional  premature atrial contractions.   Overall, the patient is getting along reasonably well, I am really quite  pleased with his progress.  He has had close follow up with all of his  doctors.  We will see him back in follow up in 3 months.  He should  continue on Coumadin anticoagulation.  He says that he has had a CBC  done recently and I reiterated to him his mild anemia.     Arturo Morton. Riley Kill, MD, [facility]  Electronically Signed    TDS/MedQ  DD: 01/04/2007  DT: 01/05/2007  Job #: [id number]

## 2011-05-07 NOTE — Assessment & Plan Note (Signed)
[facility]                         ELECTROPHYSIOLOGY OFFICE NOTE   [name], Dwayne [name]                         MRN:          [id number]  DATE:12/06/2006                            DOB:          [birth year]    Dwayne [name] returns today for followup.  He is a very pleasant elderly male  with a history of paroxysmal atrial fibrillation and symptomatic tachy-  brady syndrome and syncope secondary to all the above.  He is status  post pacemaker insertion.  His procedure was complicated by hematoma, as  he was on Coumadin.  He returns today for followup.  Overall, he has  done well.  He denies chest pain or shortness of breath, and his  episodes of near syncope have resolved completely.  He also noted that  his atrial fibrillation has been less bothersome.   PHYSICAL EXAMINATION:  GENERAL:  On exam today, he is a pleasant, well-  appearing man in no acute distress.  The blood pressure was 140/90,  pulse 90 and regular, respirations 18.  NECK:  No jugular venous distention.  LUNGS:  Clear bilaterally to auscultation.  CARDIOVASCULAR:  Regular rate and rhythm with normal S1 and S2.  EXTREMITIES:  No edema.  His pacemaker insertion site was healed nicely  with no residual bruising or hematoma.   Interrogation of his pacemaker demonstrates a Medtronic Adapta.  P waves  were 4, R waves were 8.  Patient impedance 567 in the atrium, 561 in the  right ventricle.  The threshold is 0.75 at 0.4 in both the atrium and  the right ventricle.  Battery voltage is 2.77 volts.  Today, we ill turn  his outputs down to 2 at 0.4 in the atrium and 2.5 at 0.4 in the  ventricle to maximize battery longevity.   IMPRESSION:  1. Symptomatic bradycardia.  2. Paroxysmal atrial fibrillation.  3. Status post pacemaker insertion.  4. Chronic Coumadin therapy.   DISCUSSION:  Overall, Dwayne [name] is stable, and his pacemaker is working  normally.  We will plan to see him back in the office  in 1 year.     Doylene Canning. Ladona Ridgel, MD  Electronically Signed    GWT/MedQ  DD: 12/06/2006  DT: 12/06/2006  Job #: [id number]   cc:   Alfonse Alpers. Dagoberto Ligas, M.D.

## 2011-05-07 NOTE — Assessment & Plan Note (Signed)
[facility]                               PULMONARY OFFICE NOTE   [name], Dwayne [name]                         MRN:          [id number]  DATE:05/25/2006                            DOB:          12-08-1932    REASON FOR CONSULTATION:  Reason for consultation asked for by Dr. Stevphen Rochester to evaluate Mr. Kreis for recurrent episodes of bronchitis.   BRIEF HISTORY:  1. Mr. Valdes is a 77 year old man with a history of coronary artery disease      status post CABG and subsequent stent to one of his bypass grafts.  2. Atrial fibrillation.  3. Allergic rhinitis.  4. Recurrent episodes of bronchitis and sinusitis.   He tells me he has a long history of allergic rhinitis and has had multiple  therapies for this.  He is currently being treated with Flonase and Allegra  and he uses a Breath-rite strip.  His allergies are fairly well controlled  at this time, although they do flare.  He is allergic to multiple dusts and  to molds.  He complains of persistent post nasal drip.  He has had a minimal  cough except for in the setting of discrete episodes of what sound like  recurrent bronchitis, where he produces purulent sputum.  These occur almost  always after an upper respiratory infection or in a setting of a sinus  infection.  It happened twice last fall and more recently he has had an  episode in May of 2007 that prompted a chest x-ray that showed some  bilateral lower lobe haziness.  He therefore underwent a CT scan of the  chest without contrast that showed diffuse lower lobe bronchial wall  thickening and some scattered bronchiolectasis.  There was no frank  bronchiectasis.  There was some bilateral lower lobe atelectasis and  elevation of both hemi-diaphragms more so on the right than on the left.  No  pathologic lymphadenopathy was noted.  Mr. Franze was treated with antibiotics  and he is currently feeling fairly well.  He is referred for evaluation of  his CT scan abnormalities and for his recurrent episodes of what appear to  be a lower respiratory tract infection.  He denies any baseline coughing.  He is not having any hemoptysis.  He denies any aspiration symptoms.  He  does complain of frequent episodes of irregular heart beats and palpitations  that can occasionally be associated with chest pain.  He has had some  dyspnea with exertion, some sore throat, nasal congestion and sneezing.  He  has also had some intentional weight loss, although he is not sure of the  amount.  He is able to participate in cardiac rehab and exercise regularly.   PAST MEDICAL HISTORY:  1. Hypertension.  2. Coronary artery disease - status post CABG and then subsequent stent to      bypass graft in December of 2003.  3. Atrial fibrillation.  4. Hyperlipidemia.  5. Allergic rhinitis and chronic sinusitis.  6. Status post sinus surgery.  7. Status post removal of left  renal cyst and left ureteral stricture.  8. Status post targis procedure in 2000.  9. History of epistaxis requiring transfusions and packing.  10.Plantar fasciitis.  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  12.Stress fracture of the left foot.  13.Fiberoptic bronchoscopy performed in 1980s which was reportedly      unremarkable.  It is not clear to me why this test was performed.   ALLERGIES:  1. PENICILLIN.  2. SULFA DRUGS.  3. FLAGYL.  4. NEUTRA-SWEET  5. CAFFEINE.  6. LACTOSE INTOLERANT.   CURRENT MEDICATIONS:  1. Aspirin 81 mg daily.  2. Toprol XL 25 mg daily.  3. Citracal D 630 ml  b.i.d.  4. Coumadin 1 mg daily.  5. Crestor 10 mg daily.  6. Benicar 20 mg q. h.s.  7. Proscar 5 mg q. h.s.  8. Hytrin 5 mg q. h.s.  9. Allegra 180 mg p.o. q. h.s.  10.Flonase 1 spray to each nostril q. day.   SOCIAL HISTORY:  The patient is married and lives with his wife.  He is a  never smoker.  Does not use significant alcohol.   FAMILY HISTORY:  Significant for  asthma in his paternal grandmother.  Coronary artery disease in his mother.   REVIEW OF SYSTEMS:  As per the HPI.   PHYSICAL EXAMINATION:  GENERAL:  This is a pleasant gentleman whose wife  interacts appropriately.  HEENT:  Has evidence of positive post nasal drip with some posterior  pharyngeal erythemias.  Pupils equal, round and react to light and  accommodation.  HEART:  The heart is irregularly irregular without murmur.  LUNGS:  Clear to auscultation bilaterally.  His diaphragmatic excursion  appears to be equal bilaterally.  ABDOMEN:  Soft, non-tender, non-distended with positive bowel sounds.  EXTREMITIES:  No cyanosis, clubbing, or edema.  NEUROLOGICALLY:  He has a grossly non-focal exam.  VITAL SIGNS:  Weight 203 pounds.  Temperature 98.0.  Blood pressure 122/70.  Heart rate 66.  SPO2 is 97% on room air.   A CT scan of the chest that was performed on May 04, 2006 showed hazy  bilateral lower lobe and right middle lobe peribronchial infiltrates with  some bronchial wall thickening and some evidence of scattered  bronchiolectasis.  There was no bronchiectasis present.  There was some  segmental atelectasis in both lower lobes.  There were no pathologic lymph  nodes seen.   IMPRESSION:  1. Allergic rhinitis which appears to be fairly well controlled at this      time.  2. Abnormal computerized tomography of the chest with some left lower lobe      and right lower and middle lobe atelectasis and scarring with some      evidence of bronchiolectasis.  Certainly these changes make him a setup      for recurrent bronchitis.  They likely relate to his history of      pneumonia in the past.   PLANS:  1. Repeat his computerized tomography of the chest in three months to look      for interval change in his lower lobe findings which are likely      chronic.  2. Send sputum for culture and sensitivities when he is able to produce a     sample to look for microbacterial  colonization or atypical bacterial      colonization that may be contributing to episodes of recurrent      bronchitis.  3. Continue his Allegra and Flonase as  ordered.  4. Return to see me in three months after his computerized tomography of      the chest has been performed.                                   Leslye Peer, MD   RSB/MedQ  DD:  07/28/2006  DT:  07/28/2006  Job #:  [id number]   cc:   Kerry Kass, MD  Arturo Morton. Riley Kill, MD, [facility]  Alfonse Alpers. Dagoberto Ligas, MD  Kristine Garbe Ezzard Standing, MD  Maretta Bees. Vonita Moss, MD  Kathryne Hitch, MD

## 2011-05-07 NOTE — Assessment & Plan Note (Signed)
[facility]                              CARDIOLOGY OFFICE NOTE   NAME:Noteboom, KALLEN MCCRYSTAL                         MRN:          [id number]  DATE:10/21/2006                            DOB:          [birth year]    Mr. Fayson is in for followup in general.  He is stable.  He has not been  having any chest pain or shortness of breath.  He feels well.  He thinks  that the pacemaker has really smoothed things out quite a bit.  His pacer  hematoma has literally resolved.   On his exam today, the blood pressure is 122/68.  The pulse is 80.  The lung fields are clear.  On exam, the pacer site is well-healed.   The electrocardiogram demonstrates some atrial tracking and also some atrial  pacing with normal QRS complexes.  This is narrow.  There are frequent PACs.  Overall, Danta has done well.  I think the pacemaker has helped  substantially with regard to smoothing out these episodes where he has  paroxysmal atrial fibrillation.  He still feels them as of this morning but  he says that he tolerates them much better.  We will see him back in  followup in about a month and he will remain closely managed in the Coumadin  Clinic.     Arturo Morton. Riley Kill, MD, [facility]  Electronically Signed    TDS/MedQ  DD: 10/21/2006  DT: 10/21/2006  Job #: [id number]

## 2011-05-07 NOTE — Assessment & Plan Note (Signed)
[facility]                              CARDIOLOGY OFFICE NOTE   NAME:[name], Dwayne XIANG                         MRN:          [id number]  DATE:07/25/2006                            DOB:          Oct 20, 1932    Dwayne [name] is in for a follow-up visit.  To briefly summarized, he has been  documenting his pressures and also his rhythm very carefully.  He thinks he  knows whether he is in atrial fibrillation.  Just before the atrial  fibrillation ends, he has what he describes as a dim-down which is fairly  brief, but he says he feels like he may just about be going out, and then  all of a sudden he feels great.  He says he feels a lot better.  In  correlating these so-called spells, it is fairly apparent that his blood  pressure is down in the 100/70 range when he notices that he is in what he  thinks is atrial fibrillation.  Following the little spell, he will then be  in normal rhythm, and his blood pressure will be back up in the 140 range.  It seems fairly clear that he is having post-termination pauses.  He  otherwise feels okay.  His INR today is ______.   PHYSICAL EXAMINATION:  VITAL SIGNS:  Blood pressure 112/68, pulse 68.  LUNGS:  The lung fields are clear.  CARDIAC:  The cardiac rhythm is regular.  EXTREMITIES:  Reveal no edema.   His EKG today reveals normal sinus rhythm with a rate of 62.  There are no  acute changes.   To summarize, Dwayne [name] has paroxysmal atrial fibrillation.  He is  symptomatic only from the standpoint that pauses seem to lead to feeling  quite weak.  I think more than likely he will need backup pacing with  uptitration of his beta blockade.  I am going to send him back to Dr. Ladona Ridgel  at this point in time for consideration of permanent pacing.  He will be  sent to the lab today for an INR check.                              Arturo Morton. Riley Kill, MD, [facility]    TDS/MedQ  DD:  07/25/2006  DT:  07/25/2006  Job #:  [id number]

## 2011-05-07 NOTE — Consult Note (Signed)
NAME:  MIKYLE, SOX NO.:  [id number]   MEDICAL RECORD NO.:  [id number]          PATIENT TYPE:  INP   LOCATION:  3705                         FACILITY:  [facility]   PHYSICIAN:  Kristine Garbe. Ezzard Standing, M.D.DATE OF BIRTH:  [birth year]   DATE OF CONSULTATION:  10/29/2005  DATE OF DISCHARGE:  10/30/2005                                   CONSULTATION   REASON FOR CONSULTATION:  Evaluate patient with epistaxis.   BRIEF HISTORY:  Dwayne [name] is a 77 year old gentleman with history of atrial  fibrillation on Coumadin. The patient developed nosebleed yesterday morning,  was seen in the emergency room where his nose was packed with a Rhino pack.  He was admitted to the hospital. He had further bleeding while in the  hospital and I was consulted for further bleeding after pack was placed. The  patient initially developed bleeding from the right side of his nose.  Because of further bleeding and it was coming out the left side, a pack was  also placed on the left side. His PT was prolonged. The patient received  some vitamin K and fresh frozen plasma, and bleeding had subsequently  decreased. I initially saw the patient Friday morning and moved the pack to  the left side of his nose and followed up back on Friday p.m. He had no  further bleeding after moving the pack from the left portion of his nose.  The pack was an anterior Rhino-type pack. The right nasal pack was removed  at bedside. After removing the right nasal pack there was a fair amount of  blood clot within the nose and this was cleaned. After cleaning all the  remaining blood clot from the right posterior nasal cavity there was no  active bleeding noted. I had the patient blow his nose several times and he  could not start any bleeding. There were several anterior septal vessels,  could not identify a definite origin or site of his most recent epistaxis.  Question whether this represented an anterior or posterior  nosebleed. The  nose was cleaned and several of the anterior right septal vessels were  cauterized using silver nitrate. After cauterizing the vessels, an  anterior/posterior long Merocel was placed in the right nasal cavity.   IMPRESSION:  Right-sided epistaxis. The nose was cauterized anteriorly and  packed with an anterior/posterior Merocel pack. We will follow the patient  tomorrow. If the patient is discharged will have him follow  up in my office  in three to four days for recheck and remove the Merocel pack.           ______________________________  Kristine Garbe. Ezzard Standing, M.D.     CEN/MEDQ  D:  10/29/2005  T:  10/31/2005  Job:  [id number]

## 2011-05-07 NOTE — Assessment & Plan Note (Signed)
[facility]                                 ON-CALL NOTE   [name], Dwayne [name]                         MRN:          [id number]  DATE:03/07/2007                            DOB:          10/14/1932    I received a call from [place] at [facility].  I spoke with Mr.  Baney directly.  He is scheduled to have, apparently, a series of shots.  He would have to come off Coumadin for three days.  My assumption is  that he would be restarted back on Coumadin during the interim between  shots.  The patient, in general, has been stable from a cardiac  standpoint and, from a pure cardiac standpoint, should be stable with  the discontinuation of Coumadin.  I did make him aware that, any time  Coumadin is withdrawn during the setting of atrial fibrillation, there  is some risk involved with regard to thromboembolic events, and he is  aware of this.  He has spinal stenosis, apparently, and his symptoms  have been moderately severe, so he wants to go ahead with the shots.  We  will be available should any problems develop.     Arturo Morton. Riley Kill, MD, [facility]  Electronically Signed    TDS/MedQ  DD: 03/07/2007  DT: 03/08/2007  Job #: [id number]   cc:   [name]. Cleophas Dunker, M.D.  Alfonse Alpers. Dagoberto Ligas, M.D.

## 2011-05-07 NOTE — Assessment & Plan Note (Signed)
[facility]                               PULMONARY OFFICE NOTE   NAME:Smethers, ISAIAHS CHANCY                         MRN:          [id number]  DATE:08/10/2006                            DOB:          [birth year]    SUBJECTIVE:  Mr. Barreira is a 77 year old man with a history of allergic  rhinitis, coronary artery disease status post CABG, and recurrent episodes  of bronchitis and sinusitis.  He was seen in early June 2007 after he had  refractory episode of bronchitis.  He since improved as has his allergic  rhinitis.  Part of his work up included an abnormal CT scan of the chest  that showed some left lower lobe scarring and some bilateral lower lobe  atelectasis and bronchiolectasis.  He has had a followup CT scan of the  chest in order to ensure stability of these changes.  He follows up today to  review the results of this study.  He has been doing fairly well since our  last visit.  He is not complaining of any breathing difficulty.  He does  have some slightly increased sinus congestion.  He is scheduled to have a  pacemaker placed on August 24, 2006 by Dr. Ladona Ridgel.  He tells me that he  has not yet had his CT scan of the chest as we had originally planned.   MEDICATIONS:  1. Aspirin 81 mg daily.  2. Metoprolol ER 24 mg daily.  3. Citracal 630 mg b.i.d.  4. Coumadin as directed.  5. Crestor 10 mg daily.  6. Benicar 20 mg daily.  7. Allegra 180 mg daily.  8. Finasteride 5 mg daily.  9. Terazosin 5 mg daily.  10.Flonase 1 spray in each nostril daily.   PHYSICAL EXAMINATION:  GENERAL:  This is a pleasant gentleman who interacts  appropriately.  HEENT:  His oropharynx has some mild erythema.  His pupils are normal.  HEART:  Regularly irregular without murmur.  LUNGS:  Clear to auscultation bilaterally.  ABDOMEN:  Soft, obese, nontender with positive bowel sounds.  EXTREMITIES:  No cyanosis, clubbing or edema.  VITAL SIGNS:  Weight 207 pounds,  temperature 98.2, blood pressure 134/72,  heart rate 63, SpO2 97% on room air.   IMPRESSION:  1. Allergic rhinitis.  It appears to be stable at this time on Allegra and      Flonase.  2. Bilateral lower lobe scarring and bronchiolectasis on prior CT scan of      the chest.   PLAN:  1. I will reorder his CT scan of the chest and we will call him with the      results.  2. He will continue his Allegra and Flonase as ordered.  3. I will follow up with Mr. Kakos by phone.  He knows to call me if he has      any difficulties in the interim.  Leslye Peer, MD   RSB/MedQ  DD:  08/16/2006  DT:  08/17/2006  Job #:  [id number]   cc:   Kerry Kass, MD  Alfonse Alpers Dagoberto Ligas, MD  Kristine Garbe Ezzard Standing, MD  Maretta Bees. Vonita Moss, MD  Kathryne Hitch, MD  Arturo Morton. Riley Kill, MD, [facility]

## 2011-05-07 NOTE — H&P (Signed)
NAME:  Dwayne [name], Dwayne [name] NO.:  [id number]   MEDICAL RECORD NO.:  [id number]          PATIENT TYPE:  EMS   LOCATION:  MAJO                         FACILITY:  [facility]   PHYSICIAN:  Willa Rough, M.D.     DATE OF BIRTH:  [birth year]   DATE OF ADMISSION:  10/28/2005  DATE OF DISCHARGE:                                HISTORY & PHYSICAL   PRIMARY CARDIOLOGIST:  Dr. Bonnee Quin   PRIMARY CARE PHYSICIAN:  Dr. Dagoberto Ligas   PATIENT PROFILE:  A 77 year old white male with prior history of CAD and  paroxysmal atrial fibrillation on chronic Coumadin who presents with nose  bleed.   PROBLEM LIST:  1.  Nose bleed.  2.  Paroxysmal atrial fibrillation on chronic Coumadin.  3.  Coronary artery disease.      1.  Status post CABG x5 in 1993 with left internal mammary artery to          left anterior descending, vein graft to obtuse marginal and          circumflex, vein graft to posterior descending and posterolateral.      2.  November 30, 2005 non ST elevation myocardial infarction with          subsequent catheterization and PCI.  Catheterization revealed patent          left internal mammary artery to left anterior descending, patent          vein graft to the posterior descending/posterolateral artery, and a          99% stenosis at the ostium of the vein graft to the circumflex and          the obtuse marginal.  The vein graft to the circumflex and the          obtuse marginal was stented with a 2.75 x 18 mm Zeta bare metal          stent.  4.  Hypertension.  5.  Hyperlipidemia.  6.  Paroxysmal atrial fibrillation.  7.  Benign prostatic hypertrophy.   HISTORY OF PRESENT ILLNESS:  A 77 year old white male with prior history of  CAD, PAF on chronic Coumadin anticoagulation.  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  He experiences one to two-hour runs of paroxysmal atrial  fibrillation approximately once a  week and has not had anything in about two  weeks.  This morning he awoke at 5 a.m. with a right-sided nose bleed which  became quite heavy prompting him to come into the ED.  Following arrival  here he developed left-sided bleeding requiring bilateral packing.  INR was  noted to be 3.2.  He was otherwise asymptomatic.   ALLERGIES:  PENICILLIN, SULFA, FLAGYL, QUESTRAN, CAFFEINE, THORAZINE,  DOXYCYCLINE, ERYTHROMYCIN.   CURRENT MEDICATIONS:  1.  Aspirin 81 mg daily.  2.  Toprol XL 25 mg daily.  3.  Benicar 20 mg q.h.s.  4.  Coumadin 1 mg q.h.s.  5.  Crestor 5 mg daily.  6.  Hytrin 2 mg daily.  7.  Allegra 180 mg daily.  8.  Proscar 5 mg daily.  9.  Flonase daily.   FAMILY HISTORY:  Mother died of CVA at 1.  His father may have died of a  CVA at 38, he is not sure.   SOCIAL HISTORY:  He lives in [place] with his wife.  He is retired.  He  is married and has four children.  He has never smoked, used alcohol, or  drugs.  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   REVIEW OF SYSTEMS:  Positive for nose bleed.  All other systems reviewed and  negative.   PHYSICAL EXAMINATION:  VITAL SIGNS:  Temperature 97, heart rate 78,  respirations 20, blood pressure 138/80, pulse ox 93% on room air.  GENERAL:  Pleasant white male in no acute distress.  Awake, alert, and  oriented x3.  NECK:  Normal carotid upstrokes.  No bruits or JVD.  LUNGS:  Respirations regular and unlabored.  Clear to auscultation.  CARDIAC:  Regular S1, S2.  No S3, S4, murmurs.  ABDOMEN:  Round, soft, nontender, nondistended.  Bowel sounds present x4.  EXTREMITIES:  Warm, dry, pink.  No clubbing, cyanosis, edema.  Dorsalis  pedis, posterior tibial pulses 2+ and equal bilaterally.  HEENT:  Nose is packed in bilateral nares with gauze that is soaked through  with blood.   LABORATORIES:  Hemoglobin 14.2, hematocrit 42, WBC 15.1, platelets 311.  His  INR is 3.2.   ASSESSMENT/PLAN:  1.   Nose bleed.  I have discussed this with Dr. Myrtis Ser and his feeling is that      we need to stop the bleeding now and therefore will give 2 units of FFP.      Will also give 3 mg of subcutaneous vitamin K.  Will follow up H&H.  2.  Atrial fibrillation.  He is currently in sinus rhythm and has infrequent      episodes that he tolerates relatively well.  There has been discussion      in the past regarding anti-arrhythmic therapy which preferably would be      avoided.  With regards to long-term anticoagulation this will have to be      rethought considering today's nose bleeds and his sensitivity to      Coumadin as he is only on 1 mg a night with his INR being 3.2.  Consider      long-term treatment with full-strength aspirin.  This will be discussed      with Dr. Riley Kill.  3.  Coronary artery disease, stable.  He exercises without chest pain.      Continue beta blocker, ARB, aspirin, Statin.  4.  Hypertension, currently elevated.  He had previously been on higher      doses of both Toprol and Benicar; however, this caused him to bottom out      his blood pressures.  5.  Hyperlipidemia.  Check lipids and LFTs.  Continue Statin therapy.  6.  Benign prostatic hypertrophy.  Continue Hytrin and Proscar.      Ok Anis, NP    ______________________________  Willa Rough, M.D.    CRB/MEDQ  D:  10/28/2005  T:  10/28/2005  Job:  [id number]

## 2011-05-07 NOTE — Discharge Summary (Signed)
NAME:  [name], Dwayne [name] NO.:  [id number]   MEDICAL RECORD NO.:  [id number]          PATIENT TYPE:  INP   LOCATION:  4705                         FACILITY:  [facility]   PHYSICIAN:  Noralyn Pick. Eden Emms, MD, FACCDATE OF BIRTH:  06-Jul-1932   DATE OF ADMISSION:  08/26/2006  DATE OF DISCHARGE:  08/28/2006                                 DISCHARGE SUMMARY   DISCHARGING PHYSICIAN:  Dr. Maurine Cane.   PRIMARY CARDIOLOGIST:  Dr. Riley Kill.   EP:  Dr. Ladona Ridgel.   DISCHARGE DIAGNOSIS:  Dizziness, secondary to hypotension.   PAST MEDICAL HISTORY:  Includes:  1. Paroxysmal atrial fibrillation.  2. Tachy brady syndrome.  3. Status post permanent pacemaker, August 24, 2006.  4. Coronary artery disease, status post coronary artery bypass graft.  5. Hypercholesterolemia.  6. Hypertension.  7. BPH.  8. History of abnormal chest CT, followed by Dr. Delton Coombes.   HOSPITAL COURSE:  [name] is a very pleasant 77 year old Caucasian  gentleman with a medical history as stated above, recently discharged from  [facility] status post placement of a Medtronic adapted dual-chamber  pacemaker in the setting of recurrent syncope, secondary to post atrial  fibrillation termination causes.  [name] had done well with procedure and  discharged home.  He returned to our office on the day of admission,  complaining of weakness and dizziness.  Patient found to be hypotensive.  Patient was admitted for hydration and adjustments in medication.  His  Benicar was placed on hold, started on digoxin for rate control.  His  extended release metoprolol was discontinued and patient was placed on  metoprolol 25 mg b.i.d.  Dr. Eden Emms in to see patient on day of discharge.  Patient vital signs stable.  Blood pressure 119/75 with a heart rate of 81.   Patient being discharged home with instructions to take the following  medications:  1. Coumadin, patient can resume this on Monday (status post recent      pacemaker  insertion).  2. Lopressor 25 mg p.o. b.i.d.  Patient has been given prescription for      this.  3. Digoxin 0.125 mg daily.  Patient has been given a prescription for      this.  4. Aspirin 81.  5. Crestor 10 mg.  6. Patient instructed to his continue his Proscar, Hytrin, Allegra and      Flonase as previously taken.  7. Hold Benicar until reevaluated by Dr. Riley Kill.   Wound care as previously instructed status post pacemaker, including diet,  activity, also per recent pacemaker instructions.  Appointment with Dr.  Riley Kill, September 20, at 2:30.  Patient instructed to call our office if he  has any problems prior to this or seek medical assistance.   DURATION OF DISCHARGE ENCOUNTER:  Twenty-five minutes.     ______________________________  Dorian Pod, ACNP    ______________________________  Noralyn Pick. Eden Emms, MD, [facility]    MB/MEDQ  D:  08/28/2006  T:  08/28/2006  Job:  [id number]

## 2011-05-07 NOTE — Assessment & Plan Note (Signed)
[facility]                              CARDIOLOGY OFFICE NOTE   NAME:[name], [name]                         MRN:          [id number]  DATE:09/08/2006                            DOB:          [birth year]    Dwayne [name] is in for a followup visit. He has had a complicated recent course. He  has a large hematoma related to his pacer implant. He has just been  dismissed from the hospital, and his Coumadin was held. Dwayne [name] is getting  ready to go to the beach and is going to be gone all next week. He has  however improved somewhat.   MEDICATIONS:  1. Aspirin 81 mg daily.  2. Metoprolol ER 25 mg b.i.d.  3. Warfarin on hold.  4. Crestor 10 mg daily.  5. Benicar 20 mg 1/2 tablet daily.  6. Allegra 180 mg daily.  7. Proscar 5 mg daily.  8. Terazosin 5 mg daily.  9. Flonase 1 spray daily.   PHYSICAL EXAMINATION:  GENERAL:  He is alert and oriented in no acute  distress.  VITAL SIGNS:  Blood pressure is 131/78, pulse 74.  CHEST:  Lung fields are clear. The pacer site is actually relatively soft  but there is extensive ecchymosis noted.   The electrocardiogram demonstrates atrial pacing and ventricular tracking.  With a magnet, there is atrial ventricular pacing.   He is stable. He plans to go to the beach. I have discussed this case with  the eletrophysiologists and we are in agreement that we withhold restarting  his Coumadin until he returns from the beach. At that time, he can restart  low dose Coumadin anticoagulation and we will be able to reassess the  situation. I think he will be clinically better with the pacemaker in place.       Arturo Morton. Riley Kill, MD, [facility]     TDS/MedQ  DD:  09/22/2006  DT:  09/23/2006  Job #:  [id number]

## 2011-05-07 NOTE — Op Note (Signed)
NAME:  Dwayne [name], Dwayne [name] NO.:  [id number]   MEDICAL RECORD NO.:  [id number]          PATIENT TYPE:  OIB   LOCATION:  3712                         FACILITY:  [facility]   PHYSICIAN:  Doylene Canning. Ladona Ridgel, MD    DATE OF BIRTH:  04-Sep-1932   DATE OF PROCEDURE:  08/24/2006  DATE OF DISCHARGE:                                 OPERATIVE REPORT   ELECTROPHYSIOLOGIC PROCEDURE NOTE:   PROCEDURE PERFORMED:  Implantation of dual-chamber pacemaker.   INDICATIONS:  Symptomatic tachy-brady syndrome.   I - INTRODUCTION:  The patient is a very pleasant 77 year old male with a  history of coronary disease, status post bypass surgery, history of  paroxysmal A-fib, and a history of bradycardia with documented pauses and  near syncopal episodes secondary to the pauses.  He is now referred for  permanent pacemaker insertion.   II - PROCEDURE PERFORMED:  After informed consent was obtained, the patient  is taken diagnostic EP lab in fasting state.  After the usual preparation  and draping, intravenous fentanyl and midazolam were given for sedation.  Next, 30 mL of lidocaine was infiltrated in the left infraclavicular region.  A 6 cm incision was carried out over this region.  Electrocautery utilized  to dissect down to the fascial plane.  The left subclavian vein was sharply  punctured and the Medtronic model 5076, 58-cm active fixation pacing, serial  number [id number], was advanced to the right ventricle, and the Medtronic  model 5076, 52-cm active fixation pacing lead, serial number [id number] was  advanced to right atrium.  Mapping was carried out in the right ventricle.  At the final site on the RV apical septum the R-waves measured 18, the  pacing threshold was 0.4 at 0.5, and the impedance 845 ohms, lead actively  fixed.  Ten volt pacing did not stimulate diaphragm.  With the lead in  satisfactory position, attention was then turned to placement of the atrial  lead which was placed in  the anterolateral portion of the right atrium where  P-waves measured 2.8 mV, the pacing impedance was 695 ohms, and the  threshold 0.7 at 0.5.  Again, 10 volt pacing not stimulate the diaphragm.  Did note that 10 volts pacing did in fact stimulate the ventricle, and this  was present in multiple locations in the right atrium.  However 5 volt  pacing did not stimulate the ventricle nor the diaphragm.  With these  satisfactory parameters, the leads were secured to subpectoralis fascia with  a figure-of-eight silk suture, and the sewing sleeve was also secured with  silk suture.  Electrocautery utilized to make subcutaneous pocket.  Kanamycin irrigation was utilized to irrigate the pocket, and electrocautery  utilized to assure hemostasis.  The Medtronic adapter ADDRO1 dual-chamber  pacemaker, serial number [id number], was connected to the atrial and  ventricular pacing leads and placed in the subcutaneous pocket.  Generator  secured with silk suture.  Additional kanamycin was utilized to irrigate the  pocket, and the incision closed with a layer of 2-0 Vicryl, followed by a  layer of 3-0 Vicryl, followed  by a layer of 4-0 Vicryl.  Benzoin was painted  on the skin, Steri-Strips were applied, the pressure dressing was placed,  and the patient was returned to his room in satisfactory condition.   III - COMPLICATIONS:  There were no immediate procedure complications.   IV - RESULTS:  This demonstrate successful implantation of a Medtronic dual-  chamber pacemaker in a patient with symptomatic tachy-brady syndrome.           ______________________________  Doylene Canning. Ladona Ridgel, MD     GWT/MEDQ  D:  08/24/2006  T:  08/24/2006  Job:  [id number]   cc:   Arturo Morton. Riley Kill, MD, [facility]  Alfonse Alpers. Dagoberto Ligas, M.D.

## 2011-05-07 NOTE — Assessment & Plan Note (Signed)
[facility]                              CARDIOLOGY OFFICE NOTE   NAME:[name], [name]                         MRN:          [id number]  DATE:09/22/2006                            DOB:          [birth year]    [name] is in for a follow up visit.  He went to the beach for the last  couple of weeks and really has done quite well.  He has a little bit of  shortness of breath and he says he is only about 75% back to where he was  from.  He has been taking his blood pressures on a regular basis and they  are a little bit higher and he asked about whether or not his blood pressure  medicine should be increased.  He also would like to start square dancing  again, and wants to go back to cardiac rehab.  He is planning to go out of  town within a couple of weeks.   On physical today, the blood pressure is 142/86, the pulse is 82.  The lung  fields are clear.  The actual site of the pacemaker looks excellent. It is  now well healed.  Review of his blood pressures reveals slightly elevated  pressures more recently with 152/81, 157/89, 132/84 noted.   The electrocardiogram demonstrates atrial pacing with ventricular tracking.   IMPRESSION:  1. Coronary artery disease status post coronary artery bypass graft      surgery and subsequent percutaneous coronary intervention.  2. Status post implantation.  3. Post pacer implantation hematoma.  4. History of allergic rhinitis.  5. History of recurrent atrial fibrillation.  6. Hypercholesterolemia.   PLAN:  1. We will re-start Coumadin at his prior dose which is 1 mg most days,      but 0.5 mg two days of the week, he will come to the Coumadin clinic on      Monday.  2. We will increase his Benicar at 20 mg daily.  3. He will return to the clinic in two weeks to see me back in follow up      and report any problems with [place].  4. He will return to the Coumadin clinic on Monday.      Arturo Morton. Riley Kill, MD,  [facility]    TDS/MedQ  DD:  09/22/2006  DT:  09/23/2006  Job #:  [id number]   cc:   Alfonse Alpers. Dagoberto Ligas, M.D.

## 2011-05-07 NOTE — Cardiovascular Report (Signed)
NAME:  Dwayne [name], Dwayne [name] NO.:  [id number]   MEDICAL RECORD NO.:  [id number]                   PATIENT TYPE:  INP   LOCATION:  1827                                 FACILITY:  [facility]   PHYSICIAN:  Arturo Morton. Riley Kill, M.D. [facility]         DATE OF BIRTH:  1932/08/31   DATE OF PROCEDURE:  11/30/2002  DATE OF DISCHARGE:                              CARDIAC CATHETERIZATION   INDICATIONS FOR PROCEDURE:  Dwayne [name] is a very pleasant 77 year old gentleman  who is well-known to me.  He previously underwent revascularization surgery  by Dr. Particia Lather.  At that time, he had an internal mammary placed to  the LAD, saphenous vein sequentially to the PDA posterolateral branch, and  sequential saphenous vein graft to the OM and circumflex.  The current study  is done to assess coronary anatomy after the patient presented with  substernal chest pain and had positive enzymes.  The patient was seen by Dr.  Dietrich Pates and was scheduled for urgent cardiac catheterization.   PROCEDURE:  1. Left heart catheterization.  2. Selective coronary arteriography.  3. Selective left ventriculography.  4. Saphenous vein graft angiography x2.  5. Selective left internal mammary angiography.  6. Aortic root aortography.  7. Percutaneous intervention of the ostium of the saphenous vein graft to     the OM and circumflex.   PROCEDURE IN DETAIL:  The patient was brought to the catheterization lab and  prepped and draped in the usual fashion.  Through an anterior puncture, the  right femoral artery was entered.  A 6 French sheath was initially placed.  Views of the left coronary artery were obtained in multiple angiographic  projections.  Vein graft angiography and internal mammary angiography were  then performed.  Following this, we eventually used a right catheter to  enter the right ostium.  Ventriculography was performed in the RAO  projection.  Proximal root aortography was also  initially performed prior to  the RCA angiogram to identify the ostium in the right coronary.  Overall,  the patient tolerated the procedure well.  There was evidence of a subtotal  occlusion of the ostium of the vein graft to the obtuse marginal and  circumflex.  Based on the anatomy, it was felt that a percutaneous  intervention of the vein graft should be performed.  The vein graft came off  at a very acute angle.  We debated about distal protection.  I initially  considered using the guide wire, but because of the potential for stagnant  column involving the ostium, I elected not to use this after placing the  guide wire.  I felt that the filter wire would be difficult to place, and I  was concerned about using an Amplatz guide because of the ostial nature of  the lesion.  We gave the patient heparin and no glycoprotein inhibitors,  based upon the meta analysis of their  use in vein graft intervention.  The  lesion was crossed initially with a guide wire and predilated.  We then  placed a Guidant Zeta 18 mm x 2.75 mm stent across the lesion.  This was  taken up to 14 atmospheres.  There was marked improvement in the appearance  of the artery with resumption of TIMI-3 flow throughout the vessel.  We  stopped the procedure at this point.  The patient was given a dose of  Benadryl because of shaking in the midportion of the procedure, as well as a  dose of intravenous metoprolol.  ACTs were appropriate.  All catheters were  subsequently removed and the femoral sheath sewn into place.  He was taken  to the holding area in satisfactory clinical condition.   HEMODYNAMIC DATA:  1. Central aorta 135/78, mean 103.  2. Left ventricle 139/24.  3. No gradient or pull-back across the aortic valve.   ANGIOGRAPHIC DATA:  1. Ventriculography was performed in the RAO projection.  Overall systolic     function appeared to be pretty well-preserved.  No definite segmental     wall motion abnormalities  were identified.  2. The left main coronary artery was free of critical disease.  3. The LAD was heavily calcified.  The LAD had 50% proximal narrowing,     leading into a diagonal, and the vessel was totally occluded.  The     diagonal itself had segmental disease with 90% over apparently a long     area and bifurcated distally.  4. The internal mammary to the distal LAD appeared to be widely patent, and     this retrograde filled two diagonal branches.  5. The circumflex proper provide an AV circumflex and a marginal.  The     marginal was totally occluded.  6. The vein graft to the marginal had a subtotal, or 99% occlusion, with     TIMI-1 flow at the beginning of the procedure.  Following percutaneous     intervention, this was reduced to 0% with TIMI-3 flow.  The vessel     inserted what appeared to be a fairly small marginal branch and then     distally into a fairly large and somewhat diffusely diseased     posterolateral branch.  7. The right coronary artery also was heavily calcified, and there were     multiple tandem lesions leading into the PD and PLA.  The tandem lesions     were 80, 50, and 70%, and the vessel itself was fairly diffusely     diseased.  8. The saphenous vein graft to the PD and PLA were intact.  The PD itself     beyond graft insertion was moderately severely diseased with diffuse 70%     narrowing.   CONCLUSION:  1. Well-preserved overall left ventricular function.  2. Continued patency of the internal mammary to the LAD.  3. Continued patency of the saphenous vein graft to the PD and PLA with     diffuse disease of the native vessels described above.  4. Subtotal occlusion of the ostium with saphenous vein graft to the OM and     circumflex with subsequent successful balloons and stenting involving the     ostium.   DISPOSITION:  The patient will be treated medically.  He will get aspirin and Plavix.  He will get continued medical therapy, and we will  probably  keep him hospitalized for approximately 48 hours for his acute MI.  Arturo Morton. Riley Kill, M.D. [facility]    TDS/MEDQ  D:  11/30/2002  T:  12/02/2002  Job:  [id number]   cc:   CV Laboratory   Alfonse Alpers. Dagoberto Ligas, M.D.  [address], [address]  [place]  [place] [address]  Fax: [phone]

## 2011-05-07 NOTE — Assessment & Plan Note (Signed)
[facility]                               PULMONARY OFFICE NOTE   [name], Dwayne [name]                         MRN:          [id number]  DATE:10/10/2006                            DOB:          Jul 03, 1932    SUBJECTIVE:  Mr. Scibilia is a 77 year old gentleman who returns today for his  chronic recurrent bronchitis and allergic rhinitis. He tells me that his  breathing is doing well. He does continue to have a little bit of clear  nasal drainage that has been worse since the beginning of the ragweed pollen  season. He has not had any significant cough. He had a pacemaker placed in  September.   MEDICATIONS:  1. Aspirin 81 mg daily.  2. Metoprolol ER 25 mg daily.  3. Citracal D 630 mg b.i.d.  4. Coumadin as directed.  5. Crestor 10 mg daily.  6. Benicar 20 mg daily.  7. Allegra 180 mg daily.  8. Proscar 5 mg daily.  9. Terazosin 5 mg daily.  10.Flonase 1 inhalation each nostril daily.   PHYSICAL EXAMINATION:  GENERAL:  This is a pleasant, overweight gentleman  who is in no distress on room air.  VITAL SIGNS:  His weight is 210 pounds, temperature 98.3, blood pressure  136/80, heart rate 79, SPO2 96% on room air.  HEENT:  There is no pharyngeal erythema. There is stridor.  LUNGS:  Clear to auscultation bilaterally.  HEART:  Regular with a rate in the 70s. There is no murmur.  ABDOMEN:  Obese benign.  EXTREMITIES:  No cyanosis, clubbing or edema.   IMPRESSION:  1. Allergic rhinitis that is fairly stable at this time.  2. Chronic lower lobe scar and bronchiolectasis which put him at risk for      recurrent episodes of bronchitis.   PLAN:  1. Continue Allegra and Flonase as ordered.  2. Repeat CT scan of the chest in February of 2008 to ensure that there      has been no interval      change in his lower lobe scarring and bronchiolectasis.  3. Followup with me after the above or p.r.n. should he have any      difficulties.      ______________________________  Leslye Peer, MD      RSB/MedQ  DD:  10/10/2006  DT:  10/11/2006  Job #:  [id number]   cc:   Alfonse Alpers. Dagoberto Ligas, M.D.  Kerry Kass, M.D. [facility]. Ezzard Standing, M.D.  Arturo Morton. Riley Kill, MD, [facility]

## 2011-05-10 ENCOUNTER — Encounter (HOSPITAL_COMMUNITY): Payer: Medicare Other

## 2011-05-12 ENCOUNTER — Encounter (HOSPITAL_COMMUNITY): Payer: Medicare Other

## 2011-05-14 ENCOUNTER — Encounter (HOSPITAL_COMMUNITY): Payer: Medicare Other

## 2011-05-17 ENCOUNTER — Encounter (HOSPITAL_COMMUNITY): Payer: Medicare Other

## 2011-05-19 ENCOUNTER — Encounter (HOSPITAL_COMMUNITY): Payer: Medicare Other

## 2011-05-21 ENCOUNTER — Encounter (HOSPITAL_COMMUNITY): Payer: Medicare Other | Attending: Cardiology

## 2011-05-21 DIAGNOSIS — E7800 Pure hypercholesterolemia, unspecified: Secondary | ICD-10-CM | POA: Insufficient documentation

## 2011-05-21 DIAGNOSIS — Z5189 Encounter for other specified aftercare: Secondary | ICD-10-CM | POA: Insufficient documentation

## 2011-05-21 DIAGNOSIS — Z950 Presence of cardiac pacemaker: Secondary | ICD-10-CM | POA: Insufficient documentation

## 2011-05-21 DIAGNOSIS — Z951 Presence of aortocoronary bypass graft: Secondary | ICD-10-CM | POA: Insufficient documentation

## 2011-05-21 DIAGNOSIS — I10 Essential (primary) hypertension: Secondary | ICD-10-CM | POA: Insufficient documentation

## 2011-05-21 DIAGNOSIS — F17200 Nicotine dependence, unspecified, uncomplicated: Secondary | ICD-10-CM | POA: Insufficient documentation

## 2011-05-21 DIAGNOSIS — I4891 Unspecified atrial fibrillation: Secondary | ICD-10-CM | POA: Insufficient documentation

## 2011-05-21 DIAGNOSIS — I2510 Atherosclerotic heart disease of native coronary artery without angina pectoris: Secondary | ICD-10-CM | POA: Insufficient documentation

## 2011-05-21 DIAGNOSIS — Z9861 Coronary angioplasty status: Secondary | ICD-10-CM | POA: Insufficient documentation

## 2011-05-21 DIAGNOSIS — I252 Old myocardial infarction: Secondary | ICD-10-CM | POA: Insufficient documentation

## 2011-05-24 ENCOUNTER — Encounter (HOSPITAL_COMMUNITY): Payer: Medicare Other

## 2011-05-26 ENCOUNTER — Encounter (HOSPITAL_COMMUNITY): Payer: Medicare Other

## 2011-05-26 ENCOUNTER — Telehealth: Payer: Self-pay | Admitting: Cardiology

## 2011-05-26 NOTE — Telephone Encounter (Signed)
Pt rtn call 

## 2011-05-26 NOTE — Telephone Encounter (Signed)
Patient is aware of his lab results 

## 2011-05-27 ENCOUNTER — Encounter: Payer: Medicare Other | Admitting: *Deleted

## 2011-05-27 ENCOUNTER — Ambulatory Visit (INDEPENDENT_AMBULATORY_CARE_PROVIDER_SITE_OTHER): Payer: Medicare Other | Admitting: *Deleted

## 2011-05-27 DIAGNOSIS — Z7901 Long term (current) use of anticoagulants: Secondary | ICD-10-CM

## 2011-05-27 DIAGNOSIS — I4891 Unspecified atrial fibrillation: Secondary | ICD-10-CM

## 2011-05-28 ENCOUNTER — Encounter (HOSPITAL_COMMUNITY): Payer: Medicare Other

## 2011-05-31 ENCOUNTER — Encounter (HOSPITAL_COMMUNITY): Payer: Medicare Other

## 2011-06-02 ENCOUNTER — Encounter (HOSPITAL_COMMUNITY): Payer: Medicare Other

## 2011-06-04 ENCOUNTER — Encounter (HOSPITAL_COMMUNITY): Payer: Medicare Other

## 2011-06-07 ENCOUNTER — Encounter (HOSPITAL_COMMUNITY): Payer: Medicare Other

## 2011-06-09 ENCOUNTER — Encounter (HOSPITAL_COMMUNITY): Payer: Medicare Other

## 2011-06-09 ENCOUNTER — Ambulatory Visit: Payer: Medicare Other | Admitting: Internal Medicine

## 2011-06-10 ENCOUNTER — Ambulatory Visit (INDEPENDENT_AMBULATORY_CARE_PROVIDER_SITE_OTHER): Payer: Medicare Other | Admitting: Internal Medicine

## 2011-06-10 ENCOUNTER — Encounter: Payer: Self-pay | Admitting: Internal Medicine

## 2011-06-10 VITALS — BP 132/84 | HR 86 | Temp 99.0°F | Resp 16 | Wt 162.0 lb

## 2011-06-10 DIAGNOSIS — D519 Vitamin B12 deficiency anemia, unspecified: Secondary | ICD-10-CM

## 2011-06-10 DIAGNOSIS — D518 Other vitamin B12 deficiency anemias: Secondary | ICD-10-CM

## 2011-06-10 DIAGNOSIS — E785 Hyperlipidemia, unspecified: Secondary | ICD-10-CM

## 2011-06-10 MED ORDER — CYANOCOBALAMIN 1000 MCG/ML IJ SOLN
1000.0000 ug | Freq: Once | INTRAMUSCULAR | Status: AC
  Administered 2011-06-10: 1000 ug via INTRAMUSCULAR

## 2011-06-10 NOTE — Progress Notes (Signed)
Subjective:    Patient ID: Dwayne [name], male    DOB: December 20, 1932, 77 y.o.   MRN: [id number]  Anemia Presents for follow-up visit. There has been no abdominal pain, anorexia, bruising/bleeding easily, confusion, fever, leg swelling, light-headedness, malaise/fatigue, pallor, palpitations, paresthesias, pica or weight loss. Signs of blood loss that are not present include hematemesis, hematochezia, melena, menorrhagia and vaginal bleeding. There are no compliance problems.  Compliance with medications is 76-100%.      Review of Systems  Constitutional: Negative for fever, chills, weight loss, malaise/fatigue, diaphoresis, activity change, appetite change, fatigue and unexpected weight change.  HENT: Negative for sore throat, facial swelling, trouble swallowing, neck pain, neck stiffness and voice change.   Eyes: Negative.   Respiratory: Negative for apnea, cough, choking, chest tightness, shortness of breath, wheezing and stridor.   Cardiovascular: Negative for chest pain, palpitations and leg swelling.  Gastrointestinal: Negative for nausea, vomiting, abdominal pain, diarrhea, constipation, blood in stool, melena, hematochezia, abdominal distention, anal bleeding, rectal pain, anorexia and hematemesis.  Genitourinary: Negative.  Negative for vaginal bleeding and menorrhagia.  Musculoskeletal: Negative.   Skin: Negative for color change, pallor and rash.  Neurological: Negative.  Negative for light-headedness and paresthesias.  Hematological: Negative for adenopathy. Does not bruise/bleed easily.  Psychiatric/Behavioral: Negative.  Negative for confusion.       Objective:   Physical Exam  Vitals reviewed. Constitutional: He is oriented to person, place, and time. He appears well-developed and well-nourished. No distress.  HENT:  Head: Normocephalic and atraumatic.  Right Ear: External ear normal.  Left Ear: External ear normal.  Nose: Nose normal.  Mouth/Throat: Oropharynx is clear  and moist. No oropharyngeal exudate.  Eyes: Conjunctivae and EOM are normal. Pupils are equal, round, and reactive to light. Right eye exhibits no discharge. Left eye exhibits no discharge. No scleral icterus.  Neck: Normal range of motion. Neck supple. No JVD present. No tracheal deviation present. No thyromegaly present.  Cardiovascular: Normal rate, regular rhythm, normal heart sounds and intact distal pulses.  Exam reveals no gallop and no friction rub.   No murmur heard. Pulmonary/Chest: Effort normal and breath sounds normal. No stridor. No respiratory distress. He has no wheezes. He has no rales. He exhibits no tenderness.  Abdominal: Soft. Bowel sounds are normal. He exhibits no distension and no mass. There is no tenderness. There is no rebound and no guarding.  Musculoskeletal: Normal range of motion. He exhibits no edema and no tenderness.  Lymphadenopathy:    He has no cervical adenopathy.  Neurological: He is alert and oriented to person, place, and time. He has normal reflexes. He displays normal reflexes. No cranial nerve deficit. He exhibits normal muscle tone. Coordination normal.  Skin: Skin is warm and dry. No rash noted. He is not diaphoretic. No erythema. No pallor.  Psychiatric: He has a normal mood and affect. His behavior is normal. Judgment and thought content normal.        Lab Results  Component Value Date   WBC 8.6 03/23/2011   HGB 12.9* 03/23/2011   HCT 38.8* 03/23/2011   PLT 225 03/23/2011   CHOL 102 05/06/2011   TRIG 72.0 05/06/2011   HDL 38.50* 05/06/2011   ALT 19 05/06/2011   AST 21 05/06/2011   NA 141 03/23/2011   K 4.2 03/23/2011   CL 107 03/23/2011   CREATININE 0.87 03/23/2011   BUN 11 03/23/2011   CO2 27 03/23/2011   TSH 1.07 03/03/2011   INR 2.5 05/27/2011  Assessment & Plan:

## 2011-06-10 NOTE — Patient Instructions (Signed)
Pernicious Anemia Pernicious anemia may be an immune system illness. It causes the production of antibodies to cells of the stomach (parietal cells), and proteins produced by the stomach, which are needed to absorb vitamin B12. The result of this illness is the body does not absorb enough B12 from the diet. This leads to lessened red blood cell production which causes anemia. Vitamin B12 is needed for making red blood cells and keeping the nervous system healthy. Not enough vitamin B12 in your system slowly affects sensory and motor nerves. This causes problems with your nervous system (neurological) to develop over time. Neurological effects of vitamin B12 deficiency may be seen before anemia is diagnosed. This affects both men and women, between ages 40 and 70. The anemia also affects the bowel, the heart and vascular systems and cannot be prevented. CAUSES Pernicious anemia is due to a lack of substance called intrinsic factor. This is a substance made by cells in the stomach. It makes it possible to absorb vitamin B12. The reason for the lack of this substance is unknown but it may be autoimmune, genetic, or both. SYMPTOMS The following problems may be seen with this illness:  Problems develop slowly.   Rapid heart rate.   Nausea, appetite loss, and weight loss.   Difficulty maintaining proper balance.   Yellow eyes and skin.   Loss of deep tendon reflexes.   Depression.   Confusion, poor memory, and dementia.   Ringing in the ears (tinnitus).   Weakness, especially in the arms and legs.  Sore tongue.   Numbness or tingling in the hands and feet.   Pale lips, tongue, and gums.   Bleeding gums.   Shortness of breath.   Headache.   Fatigue.   RISK OF VITAMIN B12 DEFICIENCY INCREASES WITH:  Diseases or surgery affecting the stomach.   Diabetes and autoimmune disorders. Autoimmune disorders are diseases where the body makes antibodies which attack your own body tissues.    Thyroid disorders.   Genetic factors, such as in people of Northern European ancestry. It is rare in African Americans and Asians.   Family history of pernicious anemia.   Age over 40.   Strict vegetarian diet or infants breast-fed by a mother on a strict vegetarian diet.   Lack of stomach acid in older adults.   Parasitic infections and intestinal diseases.   Drugs such as H2 blockers, proton pump inhibitors, colchicine, neomycin, and aminosalicylic acid.   Alcoholism.  PREVENTIVE MEASURES:  Pernicious anemia cannot be prevented but vitamin B12 deficiencies can be prevented.   For pernicious anemia, lifelong vitamin B12 therapy will help symptoms and prevent complications.   Dietary changes can prevent deficiency. B12 is mostly from animal sources so a deficiency is more likely in a vegetarian who does not eat eggs or dairy products.  COMPLICATIONS OF PERNICIOUS ANEMIA  Heart failure.   Nerve damage that can not be reversed.   Gastric cancer.  DIAGNOSIS Your caregiver can determine what is wrong by:  Doing blood tests for vitamin B12 levels.   Checking for antibodies to the intrinsic factor.   Measuring the body's ability to absorb vitamin B12.  TREATMENT  Life long treatment usually involves vitamin B12 replacement. Monthly vitamin B12 injections are the treatment of choice to correct vitamin B12 deficiency and may be given by the patient. This therapy corrects the anemia and it may correct the neurological complications if given early enough. About 1% of vitamin B12 is absorbed (even in the absence   of intrinsic factor) so some caregivers recommend that elderly patients with gastric atrophy take oral vitamin B12 supplements in addition to monthly injections.   Some symptoms should start to clear up in a few days after treatment begins but other symptoms may take several months.   Additionally, other conditions which may lead to a deficiency should be treated.    Stop drinking alcohol if alcoholism led to the vitamin B12 deficiency.   For other patients, the vitamin may be taken by mouth or as a nasal gel (or in addition to injections).   Iron supplements may be prescribed.   Avoid taking high amounts of folic acid. It can mask the signs of vitamin B12 deficiency.   Activity may be limited until symptoms improve.   Eat a well-balanced diet.   People on strict vegetarian diets can change their diet or take vitamin B12 supplements for life.  PROGNOSIS  When caught early, the prognosis is good. Most people will do well.   Patients with this illness have a higher incidence of cancer and polyps of the stomach.   Nervous system problems may not improve if treatment does not start soon enough.  Document Released: 02/26/2004 Document Re-Released: 05/26/2010 [employer] Patient Information 2011 [employer]. 

## 2011-06-11 ENCOUNTER — Encounter (HOSPITAL_COMMUNITY): Payer: Medicare Other

## 2011-06-14 ENCOUNTER — Encounter (HOSPITAL_COMMUNITY): Payer: Medicare Other

## 2011-06-16 ENCOUNTER — Encounter (HOSPITAL_COMMUNITY): Payer: Medicare Other

## 2011-06-18 ENCOUNTER — Encounter (HOSPITAL_COMMUNITY): Payer: Medicare Other

## 2011-06-19 ENCOUNTER — Encounter: Payer: Self-pay | Admitting: Internal Medicine

## 2011-06-19 NOTE — Assessment & Plan Note (Signed)
Continue B12 injections, I have asked him to improve on his complaince

## 2011-06-19 NOTE — Assessment & Plan Note (Signed)
No changes here

## 2011-06-21 ENCOUNTER — Encounter (HOSPITAL_COMMUNITY): Payer: Medicare Other | Attending: Cardiology

## 2011-06-21 DIAGNOSIS — Z5189 Encounter for other specified aftercare: Secondary | ICD-10-CM | POA: Insufficient documentation

## 2011-06-21 DIAGNOSIS — I252 Old myocardial infarction: Secondary | ICD-10-CM | POA: Insufficient documentation

## 2011-06-21 DIAGNOSIS — Z951 Presence of aortocoronary bypass graft: Secondary | ICD-10-CM | POA: Insufficient documentation

## 2011-06-21 DIAGNOSIS — I10 Essential (primary) hypertension: Secondary | ICD-10-CM | POA: Insufficient documentation

## 2011-06-21 DIAGNOSIS — I4891 Unspecified atrial fibrillation: Secondary | ICD-10-CM | POA: Insufficient documentation

## 2011-06-21 DIAGNOSIS — E7800 Pure hypercholesterolemia, unspecified: Secondary | ICD-10-CM | POA: Insufficient documentation

## 2011-06-21 DIAGNOSIS — F17200 Nicotine dependence, unspecified, uncomplicated: Secondary | ICD-10-CM | POA: Insufficient documentation

## 2011-06-21 DIAGNOSIS — Z9861 Coronary angioplasty status: Secondary | ICD-10-CM | POA: Insufficient documentation

## 2011-06-21 DIAGNOSIS — I2510 Atherosclerotic heart disease of native coronary artery without angina pectoris: Secondary | ICD-10-CM | POA: Insufficient documentation

## 2011-06-21 DIAGNOSIS — Z950 Presence of cardiac pacemaker: Secondary | ICD-10-CM | POA: Insufficient documentation

## 2011-06-23 ENCOUNTER — Encounter (HOSPITAL_COMMUNITY): Payer: Medicare Other

## 2011-06-25 ENCOUNTER — Ambulatory Visit (INDEPENDENT_AMBULATORY_CARE_PROVIDER_SITE_OTHER): Payer: Medicare Other | Admitting: *Deleted

## 2011-06-25 ENCOUNTER — Encounter (HOSPITAL_COMMUNITY): Payer: Medicare Other

## 2011-06-25 DIAGNOSIS — Z7901 Long term (current) use of anticoagulants: Secondary | ICD-10-CM

## 2011-06-25 DIAGNOSIS — I4891 Unspecified atrial fibrillation: Secondary | ICD-10-CM

## 2011-06-28 ENCOUNTER — Encounter (HOSPITAL_COMMUNITY): Payer: Medicare Other

## 2011-06-30 ENCOUNTER — Encounter (HOSPITAL_COMMUNITY): Payer: Medicare Other

## 2011-07-02 ENCOUNTER — Encounter (HOSPITAL_COMMUNITY): Payer: Medicare Other

## 2011-07-05 ENCOUNTER — Encounter (HOSPITAL_COMMUNITY): Payer: Medicare Other

## 2011-07-07 ENCOUNTER — Encounter (HOSPITAL_COMMUNITY): Payer: Medicare Other

## 2011-07-09 ENCOUNTER — Encounter (HOSPITAL_COMMUNITY): Payer: Medicare Other

## 2011-07-12 ENCOUNTER — Encounter (HOSPITAL_COMMUNITY): Payer: Medicare Other

## 2011-07-14 ENCOUNTER — Encounter (HOSPITAL_COMMUNITY): Payer: Medicare Other

## 2011-07-16 ENCOUNTER — Ambulatory Visit (INDEPENDENT_AMBULATORY_CARE_PROVIDER_SITE_OTHER): Payer: Medicare Other | Admitting: *Deleted

## 2011-07-16 ENCOUNTER — Encounter (HOSPITAL_COMMUNITY): Payer: Medicare Other

## 2011-07-16 DIAGNOSIS — I4891 Unspecified atrial fibrillation: Secondary | ICD-10-CM

## 2011-07-16 DIAGNOSIS — Z7901 Long term (current) use of anticoagulants: Secondary | ICD-10-CM

## 2011-07-16 LAB — POCT INR: INR: 3.1

## 2011-07-19 ENCOUNTER — Encounter: Payer: Self-pay | Admitting: Internal Medicine

## 2011-07-19 ENCOUNTER — Encounter (HOSPITAL_COMMUNITY): Payer: Medicare Other

## 2011-07-20 ENCOUNTER — Encounter: Payer: Self-pay | Admitting: Internal Medicine

## 2011-07-20 ENCOUNTER — Ambulatory Visit (INDEPENDENT_AMBULATORY_CARE_PROVIDER_SITE_OTHER): Payer: Medicare Other | Admitting: Internal Medicine

## 2011-07-20 ENCOUNTER — Other Ambulatory Visit (INDEPENDENT_AMBULATORY_CARE_PROVIDER_SITE_OTHER): Payer: Medicare Other

## 2011-07-20 VITALS — BP 130/80 | HR 81 | Temp 98.0°F | Resp 16 | Wt 163.0 lb

## 2011-07-20 DIAGNOSIS — Z23 Encounter for immunization: Secondary | ICD-10-CM

## 2011-07-20 DIAGNOSIS — E785 Hyperlipidemia, unspecified: Secondary | ICD-10-CM

## 2011-07-20 DIAGNOSIS — D518 Other vitamin B12 deficiency anemias: Secondary | ICD-10-CM

## 2011-07-20 DIAGNOSIS — D519 Vitamin B12 deficiency anemia, unspecified: Secondary | ICD-10-CM

## 2011-07-20 LAB — CBC WITH DIFFERENTIAL/PLATELET
Eosinophils Relative: 3.9 % (ref 0.0–5.0)
HCT: 42.2 % (ref 39.0–52.0)
Hemoglobin: 12.8 g/dL — ABNORMAL LOW (ref 13.0–17.0)
Lymphs Abs: 1.5 10*3/uL (ref 0.7–4.0)
Monocytes Relative: 7.6 % (ref 3.0–12.0)
Platelets: 246 10*3/uL (ref 150.0–400.0)
WBC: 7.2 10*3/uL (ref 4.5–10.5)

## 2011-07-20 LAB — COMPREHENSIVE METABOLIC PANEL
AST: 25 U/L (ref 0–37)
Albumin: 4 g/dL (ref 3.5–5.2)
Alkaline Phosphatase: 54 U/L (ref 39–117)
BUN: 10 mg/dL (ref 6–23)
Creatinine, Ser: 0.7 mg/dL (ref 0.4–1.5)
Potassium: 4.3 mEq/L (ref 3.5–5.1)
Total Bilirubin: 0.5 mg/dL (ref 0.3–1.2)

## 2011-07-20 LAB — LIPID PANEL
Cholesterol: 114 mg/dL (ref 0–200)
HDL: 43.4 mg/dL (ref >39.00)
LDL Cholesterol: 46 mg/dL (ref 0–99)
VLDL: 25 mg/dL (ref 0.0–40.0)

## 2011-07-20 MED ORDER — CYANOCOBALAMIN 1000 MCG/ML IJ SOLN: 1000.0000 ug | Freq: Once | INTRAMUSCULAR | Status: DC

## 2011-07-20 NOTE — Assessment & Plan Note (Signed)
He got a B12 shot, I will check his CBC today

## 2011-07-20 NOTE — Patient Instructions (Signed)

## 2011-07-20 NOTE — Assessment & Plan Note (Signed)
I will check his labs today 

## 2011-07-20 NOTE — Progress Notes (Signed)
Subjective:    Patient ID: Dwayne [name], male    DOB: 10-18-1932, 77 y.o.   MRN: [id number]  Anemia Presents for follow-up visit. There has been no abdominal pain, anorexia, bruising/bleeding easily, confusion, fever, leg swelling, light-headedness, malaise/fatigue, pallor, palpitations, paresthesias, pica or weight loss. Signs of blood loss that are not present include hematemesis, hematochezia, melena and menorrhagia. There are no compliance problems.  Compliance with medications is 76-100%.      Review of Systems  Constitutional: Negative for fever, chills, weight loss, malaise/fatigue, diaphoresis, activity change, appetite change, fatigue and unexpected weight change.  HENT: Negative for sore throat, facial swelling, trouble swallowing, neck pain and voice change.   Eyes: Negative for photophobia, redness and visual disturbance.  Respiratory: Negative for apnea, cough, choking, chest tightness, shortness of breath, wheezing and stridor.   Cardiovascular: Negative for palpitations.  Gastrointestinal: Negative for nausea, vomiting, abdominal pain, diarrhea, constipation, blood in stool, melena, hematochezia, anal bleeding, anorexia and hematemesis.  Genitourinary: Negative for dysuria, urgency, frequency, hematuria, flank pain, decreased urine volume, enuresis, difficulty urinating and menorrhagia.  Musculoskeletal: Negative.   Skin: Negative for color change, pallor, rash and wound.  Neurological: Negative for dizziness, tremors, seizures, syncope, facial asymmetry, speech difficulty, weakness, light-headedness, numbness, headaches and paresthesias.  Hematological: Negative for adenopathy. Does not bruise/bleed easily.  Psychiatric/Behavioral: Negative for suicidal ideas, hallucinations, behavioral problems, confusion, sleep disturbance, self-injury, dysphoric mood, decreased concentration and agitation. The patient is not nervous/anxious and is not hyperactive.        Objective:   Physical Exam  Vitals reviewed. Constitutional: He is oriented to person, place, and time. He appears well-developed and well-nourished. No distress.  HENT:  Head: Normocephalic and atraumatic.  Right Ear: External ear normal.  Left Ear: External ear normal.  Nose: Nose normal.  Mouth/Throat: Oropharynx is clear and moist. No oropharyngeal exudate.  Eyes: Conjunctivae and EOM are normal. Pupils are equal, round, and reactive to light. Right eye exhibits no discharge. Left eye exhibits no discharge. No scleral icterus.  Neck: Normal range of motion. Neck supple. No JVD present. No tracheal deviation present. No thyromegaly present.  Cardiovascular: Normal rate, regular rhythm, normal heart sounds and intact distal pulses.  Exam reveals no gallop and no friction rub.   No murmur heard. Pulmonary/Chest: Effort normal and breath sounds normal. No stridor. No respiratory distress. He has no wheezes. He has no rales. He exhibits no tenderness.  Abdominal: Soft. Bowel sounds are normal. He exhibits no distension and no mass. There is no tenderness. There is no rebound and no guarding.  Musculoskeletal: Normal range of motion. He exhibits no edema and no tenderness.  Lymphadenopathy:    He has no cervical adenopathy.  Neurological: He is alert and oriented to person, place, and time. He has normal reflexes. He displays normal reflexes. No cranial nerve deficit. He exhibits normal muscle tone. Coordination normal.  Skin: Skin is warm and dry. No rash noted. He is not diaphoretic. No erythema. No pallor.  Psychiatric: He has a normal mood and affect. His behavior is normal. Judgment and thought content normal.      Lab Results  Component Value Date   WBC 8.6 03/23/2011   HGB 12.9* 03/23/2011   HCT 38.8* 03/23/2011   PLT 225 03/23/2011   CHOL 102 05/06/2011   TRIG 72.0 05/06/2011   HDL 38.50* 05/06/2011   ALT 19 05/06/2011   AST 21 05/06/2011   NA 141 03/23/2011   K 4.2 03/23/2011   CL 107  03/23/2011    CREATININE 0.87 03/23/2011   BUN 11 03/23/2011   CO2 27 03/23/2011   TSH 1.07 03/03/2011   INR 3.1 07/16/2011      Assessment & Plan:   No problem-specific assessment & plan notes found for this encounter.

## 2011-07-21 ENCOUNTER — Encounter (HOSPITAL_COMMUNITY): Payer: Medicare Other | Attending: Cardiology

## 2011-07-21 DIAGNOSIS — Z5189 Encounter for other specified aftercare: Secondary | ICD-10-CM | POA: Insufficient documentation

## 2011-07-21 DIAGNOSIS — I10 Essential (primary) hypertension: Secondary | ICD-10-CM | POA: Insufficient documentation

## 2011-07-21 DIAGNOSIS — E7800 Pure hypercholesterolemia, unspecified: Secondary | ICD-10-CM | POA: Insufficient documentation

## 2011-07-21 DIAGNOSIS — I252 Old myocardial infarction: Secondary | ICD-10-CM | POA: Insufficient documentation

## 2011-07-21 DIAGNOSIS — I4891 Unspecified atrial fibrillation: Secondary | ICD-10-CM | POA: Insufficient documentation

## 2011-07-21 DIAGNOSIS — Z951 Presence of aortocoronary bypass graft: Secondary | ICD-10-CM | POA: Insufficient documentation

## 2011-07-21 DIAGNOSIS — Z950 Presence of cardiac pacemaker: Secondary | ICD-10-CM | POA: Insufficient documentation

## 2011-07-21 DIAGNOSIS — I2510 Atherosclerotic heart disease of native coronary artery without angina pectoris: Secondary | ICD-10-CM | POA: Insufficient documentation

## 2011-07-21 DIAGNOSIS — F17200 Nicotine dependence, unspecified, uncomplicated: Secondary | ICD-10-CM | POA: Insufficient documentation

## 2011-07-21 DIAGNOSIS — Z9861 Coronary angioplasty status: Secondary | ICD-10-CM | POA: Insufficient documentation

## 2011-07-21 NOTE — Progress Notes (Signed)
Addended by: Vernie Murders on: 07/21/2011 06:27 PM   Modules accepted: Orders

## 2011-07-22 ENCOUNTER — Encounter: Payer: Medicare Other | Admitting: *Deleted

## 2011-07-23 ENCOUNTER — Encounter (HOSPITAL_COMMUNITY): Payer: Medicare Other

## 2011-07-26 ENCOUNTER — Encounter (HOSPITAL_COMMUNITY): Payer: Medicare Other

## 2011-07-27 ENCOUNTER — Encounter: Payer: Self-pay | Admitting: *Deleted

## 2011-07-28 ENCOUNTER — Encounter (HOSPITAL_COMMUNITY): Payer: Medicare Other

## 2011-07-30 ENCOUNTER — Encounter (HOSPITAL_COMMUNITY): Payer: Medicare Other

## 2011-08-02 ENCOUNTER — Encounter (HOSPITAL_COMMUNITY): Payer: Medicare Other

## 2011-08-03 ENCOUNTER — Ambulatory Visit (INDEPENDENT_AMBULATORY_CARE_PROVIDER_SITE_OTHER): Payer: Medicare Other | Admitting: *Deleted

## 2011-08-03 ENCOUNTER — Encounter: Payer: Self-pay | Admitting: Internal Medicine

## 2011-08-03 ENCOUNTER — Other Ambulatory Visit: Payer: Self-pay | Admitting: Internal Medicine

## 2011-08-03 DIAGNOSIS — Z950 Presence of cardiac pacemaker: Secondary | ICD-10-CM

## 2011-08-03 DIAGNOSIS — I4891 Unspecified atrial fibrillation: Secondary | ICD-10-CM

## 2011-08-04 ENCOUNTER — Encounter: Payer: Medicare Other | Admitting: *Deleted

## 2011-08-04 ENCOUNTER — Encounter (HOSPITAL_COMMUNITY): Payer: Medicare Other

## 2011-08-06 ENCOUNTER — Encounter (HOSPITAL_COMMUNITY): Payer: Medicare Other

## 2011-08-06 LAB — REMOTE PACEMAKER DEVICE
AL AMPLITUDE: 2.8 mv
AL THRESHOLD: 0.75 V
BAMS-0001: 175 {beats}/min
BATTERY VOLTAGE: 2.74 V
VENTRICULAR PACING PM: 83

## 2011-08-09 ENCOUNTER — Encounter (HOSPITAL_COMMUNITY): Payer: Medicare Other

## 2011-08-11 ENCOUNTER — Encounter (HOSPITAL_COMMUNITY): Payer: Medicare Other

## 2011-08-13 ENCOUNTER — Encounter (HOSPITAL_COMMUNITY): Payer: Medicare Other

## 2011-08-13 NOTE — Progress Notes (Signed)
Pacer remote check  

## 2011-08-16 ENCOUNTER — Encounter (HOSPITAL_COMMUNITY): Payer: Medicare Other

## 2011-08-18 ENCOUNTER — Encounter (HOSPITAL_COMMUNITY): Payer: Medicare Other

## 2011-08-18 ENCOUNTER — Ambulatory Visit: Payer: Medicare Other

## 2011-08-20 ENCOUNTER — Encounter (HOSPITAL_COMMUNITY): Payer: Medicare Other

## 2011-08-23 ENCOUNTER — Encounter (HOSPITAL_COMMUNITY): Payer: Medicare Other | Attending: Cardiology

## 2011-08-23 DIAGNOSIS — F17200 Nicotine dependence, unspecified, uncomplicated: Secondary | ICD-10-CM | POA: Insufficient documentation

## 2011-08-23 DIAGNOSIS — Z950 Presence of cardiac pacemaker: Secondary | ICD-10-CM | POA: Insufficient documentation

## 2011-08-23 DIAGNOSIS — Z5189 Encounter for other specified aftercare: Secondary | ICD-10-CM | POA: Insufficient documentation

## 2011-08-23 DIAGNOSIS — I10 Essential (primary) hypertension: Secondary | ICD-10-CM | POA: Insufficient documentation

## 2011-08-23 DIAGNOSIS — Z9861 Coronary angioplasty status: Secondary | ICD-10-CM | POA: Insufficient documentation

## 2011-08-23 DIAGNOSIS — I4891 Unspecified atrial fibrillation: Secondary | ICD-10-CM | POA: Insufficient documentation

## 2011-08-23 DIAGNOSIS — E7800 Pure hypercholesterolemia, unspecified: Secondary | ICD-10-CM | POA: Insufficient documentation

## 2011-08-23 DIAGNOSIS — I252 Old myocardial infarction: Secondary | ICD-10-CM | POA: Insufficient documentation

## 2011-08-23 DIAGNOSIS — Z951 Presence of aortocoronary bypass graft: Secondary | ICD-10-CM | POA: Insufficient documentation

## 2011-08-23 DIAGNOSIS — I2510 Atherosclerotic heart disease of native coronary artery without angina pectoris: Secondary | ICD-10-CM | POA: Insufficient documentation

## 2011-08-25 ENCOUNTER — Ambulatory Visit: Payer: Medicare Other

## 2011-08-25 ENCOUNTER — Encounter (HOSPITAL_COMMUNITY): Payer: Medicare Other

## 2011-08-26 ENCOUNTER — Ambulatory Visit (INDEPENDENT_AMBULATORY_CARE_PROVIDER_SITE_OTHER): Payer: Medicare Other | Admitting: *Deleted

## 2011-08-26 DIAGNOSIS — I4891 Unspecified atrial fibrillation: Secondary | ICD-10-CM

## 2011-08-26 DIAGNOSIS — Z7901 Long term (current) use of anticoagulants: Secondary | ICD-10-CM

## 2011-08-26 LAB — POCT INR: INR: 2.2

## 2011-08-27 ENCOUNTER — Encounter (HOSPITAL_COMMUNITY): Payer: Medicare Other

## 2011-08-30 ENCOUNTER — Encounter (HOSPITAL_COMMUNITY): Payer: Medicare Other

## 2011-09-01 ENCOUNTER — Encounter: Payer: Self-pay | Admitting: *Deleted

## 2011-09-01 ENCOUNTER — Encounter (HOSPITAL_COMMUNITY): Payer: Medicare Other

## 2011-09-03 ENCOUNTER — Encounter (HOSPITAL_COMMUNITY): Payer: Medicare Other

## 2011-09-06 ENCOUNTER — Encounter (HOSPITAL_COMMUNITY): Payer: Medicare Other

## 2011-09-08 ENCOUNTER — Encounter (HOSPITAL_COMMUNITY): Payer: Medicare Other

## 2011-09-10 ENCOUNTER — Encounter (HOSPITAL_COMMUNITY): Payer: Medicare Other

## 2011-09-13 ENCOUNTER — Encounter (HOSPITAL_COMMUNITY): Payer: Medicare Other

## 2011-09-15 ENCOUNTER — Encounter (HOSPITAL_COMMUNITY): Payer: Medicare Other

## 2011-09-17 ENCOUNTER — Encounter (HOSPITAL_COMMUNITY): Payer: Medicare Other

## 2011-09-20 ENCOUNTER — Encounter (HOSPITAL_COMMUNITY): Payer: Medicare Other | Attending: Cardiology

## 2011-09-20 DIAGNOSIS — Z951 Presence of aortocoronary bypass graft: Secondary | ICD-10-CM | POA: Insufficient documentation

## 2011-09-20 DIAGNOSIS — Z950 Presence of cardiac pacemaker: Secondary | ICD-10-CM | POA: Insufficient documentation

## 2011-09-20 DIAGNOSIS — I4891 Unspecified atrial fibrillation: Secondary | ICD-10-CM | POA: Insufficient documentation

## 2011-09-20 DIAGNOSIS — E7800 Pure hypercholesterolemia, unspecified: Secondary | ICD-10-CM | POA: Insufficient documentation

## 2011-09-20 DIAGNOSIS — Z9861 Coronary angioplasty status: Secondary | ICD-10-CM | POA: Insufficient documentation

## 2011-09-20 DIAGNOSIS — I2510 Atherosclerotic heart disease of native coronary artery without angina pectoris: Secondary | ICD-10-CM | POA: Insufficient documentation

## 2011-09-20 DIAGNOSIS — Z5189 Encounter for other specified aftercare: Secondary | ICD-10-CM | POA: Insufficient documentation

## 2011-09-20 DIAGNOSIS — F17200 Nicotine dependence, unspecified, uncomplicated: Secondary | ICD-10-CM | POA: Insufficient documentation

## 2011-09-20 DIAGNOSIS — I252 Old myocardial infarction: Secondary | ICD-10-CM | POA: Insufficient documentation

## 2011-09-20 DIAGNOSIS — I10 Essential (primary) hypertension: Secondary | ICD-10-CM | POA: Insufficient documentation

## 2011-09-22 ENCOUNTER — Encounter (HOSPITAL_COMMUNITY): Payer: Medicare Other

## 2011-09-23 ENCOUNTER — Encounter: Payer: Medicare Other | Admitting: *Deleted

## 2011-09-24 ENCOUNTER — Encounter (HOSPITAL_COMMUNITY): Payer: Medicare Other

## 2011-09-24 ENCOUNTER — Ambulatory Visit (INDEPENDENT_AMBULATORY_CARE_PROVIDER_SITE_OTHER): Payer: Medicare Other

## 2011-09-24 DIAGNOSIS — D518 Other vitamin B12 deficiency anemias: Secondary | ICD-10-CM

## 2011-09-24 MED ORDER — CYANOCOBALAMIN 1000 MCG/ML IJ SOLN
1000.0000 ug | Freq: Once | INTRAMUSCULAR | Status: AC
  Administered 2011-09-24: 1000 ug via INTRAMUSCULAR

## 2011-09-27 ENCOUNTER — Encounter (HOSPITAL_COMMUNITY): Payer: Medicare Other

## 2011-09-27 ENCOUNTER — Encounter: Payer: Medicare Other | Admitting: *Deleted

## 2011-09-28 ENCOUNTER — Ambulatory Visit (INDEPENDENT_AMBULATORY_CARE_PROVIDER_SITE_OTHER): Payer: Medicare Other | Admitting: *Deleted

## 2011-09-28 DIAGNOSIS — I4891 Unspecified atrial fibrillation: Secondary | ICD-10-CM

## 2011-09-28 DIAGNOSIS — Z7901 Long term (current) use of anticoagulants: Secondary | ICD-10-CM

## 2011-09-29 ENCOUNTER — Encounter (HOSPITAL_COMMUNITY): Payer: Medicare Other

## 2011-10-01 ENCOUNTER — Encounter (HOSPITAL_COMMUNITY): Payer: Medicare Other

## 2011-10-04 ENCOUNTER — Encounter (HOSPITAL_COMMUNITY): Payer: Medicare Other

## 2011-10-06 ENCOUNTER — Encounter (HOSPITAL_COMMUNITY): Payer: Medicare Other

## 2011-10-08 ENCOUNTER — Encounter (HOSPITAL_COMMUNITY): Payer: Medicare Other

## 2011-10-11 ENCOUNTER — Encounter (HOSPITAL_COMMUNITY): Payer: Medicare Other

## 2011-10-13 ENCOUNTER — Encounter (HOSPITAL_COMMUNITY): Payer: Medicare Other

## 2011-10-15 ENCOUNTER — Encounter (HOSPITAL_COMMUNITY): Payer: Medicare Other

## 2011-10-18 ENCOUNTER — Encounter (HOSPITAL_COMMUNITY): Payer: Medicare Other

## 2011-10-20 ENCOUNTER — Encounter (HOSPITAL_COMMUNITY): Payer: Medicare Other

## 2011-10-21 ENCOUNTER — Encounter: Payer: Self-pay | Admitting: Internal Medicine

## 2011-10-21 ENCOUNTER — Ambulatory Visit (INDEPENDENT_AMBULATORY_CARE_PROVIDER_SITE_OTHER): Payer: Medicare Other | Admitting: Internal Medicine

## 2011-10-21 ENCOUNTER — Ambulatory Visit (INDEPENDENT_AMBULATORY_CARE_PROVIDER_SITE_OTHER): Payer: Medicare Other | Admitting: *Deleted

## 2011-10-21 DIAGNOSIS — Z950 Presence of cardiac pacemaker: Secondary | ICD-10-CM

## 2011-10-21 DIAGNOSIS — I495 Sick sinus syndrome: Secondary | ICD-10-CM

## 2011-10-21 DIAGNOSIS — I4891 Unspecified atrial fibrillation: Secondary | ICD-10-CM

## 2011-10-21 DIAGNOSIS — Z7901 Long term (current) use of anticoagulants: Secondary | ICD-10-CM

## 2011-10-21 LAB — PACEMAKER DEVICE OBSERVATION
AL IMPEDENCE PM: 548 Ohm
ATRIAL PACING PM: 35
BAMS-0001: 175 {beats}/min
BATTERY VOLTAGE: 2.74 V
RV LEAD IMPEDENCE PM: 467 Ohm

## 2011-10-21 NOTE — Patient Instructions (Signed)
Your physician wants you to follow-up in: 12 months with Dr Taylor You will receive a reminder letter in the mail two months in advance. If you don't receive a letter, please call our office to schedule the follow-up appointment.  Remote monitoring is used to monitor your Pacemaker of ICD from home. This monitoring reduces the number of office visits required to check your device to one time per year. It allows us to keep an eye on the functioning of your device to ensure it is working properly. You are scheduled for a device check from home on 01/20/2012. You may send your transmission at any time that day. If you have a wireless device, the transmission will be sent automatically. After your physician reviews your transmission, you will receive a postcard with your next transmission date.   

## 2011-10-21 NOTE — Progress Notes (Signed)
HPI Mr. Danowski returns today for pacemaker followup. He is a very pleasant elderly man with symptomatic atrial fibrillation and symptomatic tachybradycardia syndrome. He has underlying complete heart block. The patient is status post pacemaker insertion. Since his pacemaker implant, he has done well. He denies any recurrent syncope and denies chest pain or shortness of breath. He denies peripheral edema. Allergies  Allergen Reactions  . Clindamycin     REACTION: upset stomach  . Doxycycline Hyclate     REACTION: n/v/d  . Erythromycin     REACTION: n/v/d  . Metronidazole     REACTION: rash, itching  . Penicillins     REACTION: hives  . Sulfonamide Derivatives     REACTION: hives  . Tetracycline     REACTION: n/v/d     Current Outpatient Prescriptions  Medication Sig Dispense Refill  . alendronate (FOSAMAX) 70 MG tablet Take 70 mg by mouth every 7 (seven) days. Take with a full glass of water on an empty stomach.       [name] Kitchen aspirin 81 MG tablet Take 81 mg by mouth daily.        . Calcium-Magnesium-Vitamin D (CITRACAL CALCIUM+D) 600-40-500 MG-MG-UNIT TB24 Take 1 tablet by mouth 2 (two) times daily.        . fexofenadine (ALLEGRA) 180 MG tablet Take 180 mg by mouth daily.        . finasteride (PROSCAR) 5 MG tablet Take 5 mg by mouth daily.        . fluticasone (FLONASE) 50 MCG/ACT nasal spray 2 sprays by Nasal route daily.        . metoprolol tartrate (LOPRESSOR) 25 MG tablet Take 25 mg by mouth 2 (two) times daily.       [name] Kitchen omeprazole (PRILOSEC) 40 MG capsule Take 40 mg by mouth daily.        . pregabalin (LYRICA) 75 MG capsule Take 75 mg by mouth daily.        . rosuvastatin (CRESTOR) 10 MG tablet Take 1 tablet (10 mg total) by mouth daily.  90 tablet  3  . terazosin (HYTRIN) 5 MG capsule Take 1 capsule (5 mg total) by mouth daily.  90 capsule  3  . warfarin (COUMADIN) 1 MG tablet Take by mouth as directed.        . zolpidem (AMBIEN) 10 MG tablet Take 10 mg by mouth at bedtime as needed.          Current Facility-Administered Medications  Medication Dose Route Frequency Provider Last Rate Last Dose  . cyanocobalamin ((VITAMIN B-12)) injection 1,000 mcg  1,000 mcg Intramuscular Once [name], MD         Past Medical History  Diagnosis Date  . Arrhythmia   . Hyperlipidemia   . Bronchiectasis without acute exacerbation   . Disorders of diaphragm   . Plantar fascial fibromatosis   . Epistaxis   . Unspecified sinusitis (chronic)   . Allergic rhinitis, cause unspecified   . Spinal stenosis, unspecified region other than cervical     ROS:   All systems reviewed and negative except as noted in the HPI.   Past Surgical History  Procedure Date  . Coronary artery bypass graft   . Insert / replace / remove pacemaker     2007  . Kidney cyst removal   . Doppler echocardiography 2011     No family history on file.   History   Social History  . Marital Status: Married    Spouse  Name: N/A    Number of Children: 4  . Years of Education: N/A   Occupational History  .     Social History Main Topics  . Smoking status: Never Smoker   . Smokeless tobacco: Not on file  . Alcohol Use: No  . Drug Use: No  . Sexually Active: Yes   Other Topics Concern  . Not on file   Social History Narrative  . No narrative on file     BP 136/82  Pulse 72  Ht 5\' 11"  (1.803 m)  Wt 166 lb (75.297 kg)  BMI 23.15 kg/m2  Physical Exam:  Well appearing elderly man, NAD. He is kyphotic HEENT: Unremarkable Neck:  No JVD, no thyromegally Lymphatics:  No adenopathy Back:  No CVA tenderness Lungs:  Clear with no wheezes, rales, or rhonchi. Well-healed pacemaker incision. HEART:  Regular rate rhythm, no murmurs, no rubs, no clicks Abd:  soft, positive bowel sounds, no organomegally, no rebound, no guarding Ext:  2 plus pulses, no edema, no cyanosis, no clubbing Skin:  No rashes no nodules Neuro:  CN II through XII intact, motor grossly intact  DEVICE  Normal device  function.  See PaceArt for details.   Assess/Plan:

## 2011-10-21 NOTE — Assessment & Plan Note (Signed)
His device is working normally. Will plan to recheck in several months. 

## 2011-10-21 NOTE — Assessment & Plan Note (Signed)
His underlying atrial rhythm remains atrial fibrillation. His ventricular rate is well controlled as he has underlying complete heart block. His symptoms are well-controlled this time. I would recommend no changes in his medical therapy.

## 2011-10-22 ENCOUNTER — Encounter (HOSPITAL_COMMUNITY): Payer: Self-pay

## 2011-10-22 DIAGNOSIS — I10 Essential (primary) hypertension: Secondary | ICD-10-CM | POA: Insufficient documentation

## 2011-10-22 DIAGNOSIS — F17200 Nicotine dependence, unspecified, uncomplicated: Secondary | ICD-10-CM | POA: Insufficient documentation

## 2011-10-22 DIAGNOSIS — Z9861 Coronary angioplasty status: Secondary | ICD-10-CM | POA: Insufficient documentation

## 2011-10-22 DIAGNOSIS — Z951 Presence of aortocoronary bypass graft: Secondary | ICD-10-CM | POA: Insufficient documentation

## 2011-10-22 DIAGNOSIS — E7800 Pure hypercholesterolemia, unspecified: Secondary | ICD-10-CM | POA: Insufficient documentation

## 2011-10-22 DIAGNOSIS — I252 Old myocardial infarction: Secondary | ICD-10-CM | POA: Insufficient documentation

## 2011-10-22 DIAGNOSIS — Z5189 Encounter for other specified aftercare: Secondary | ICD-10-CM | POA: Insufficient documentation

## 2011-10-22 DIAGNOSIS — I2510 Atherosclerotic heart disease of native coronary artery without angina pectoris: Secondary | ICD-10-CM | POA: Insufficient documentation

## 2011-10-22 DIAGNOSIS — I4891 Unspecified atrial fibrillation: Secondary | ICD-10-CM | POA: Insufficient documentation

## 2011-10-22 DIAGNOSIS — Z950 Presence of cardiac pacemaker: Secondary | ICD-10-CM | POA: Insufficient documentation

## 2011-10-25 ENCOUNTER — Ambulatory Visit (INDEPENDENT_AMBULATORY_CARE_PROVIDER_SITE_OTHER): Payer: Medicare Other | Admitting: *Deleted

## 2011-10-25 ENCOUNTER — Encounter (HOSPITAL_COMMUNITY): Payer: Self-pay

## 2011-10-25 DIAGNOSIS — D518 Other vitamin B12 deficiency anemias: Secondary | ICD-10-CM

## 2011-10-25 MED ORDER — CYANOCOBALAMIN 1000 MCG/ML IJ SOLN
1000.0000 ug | Freq: Once | INTRAMUSCULAR | Status: AC
  Administered 2011-10-25: 1000 ug via INTRAMUSCULAR

## 2011-10-25 MED ORDER — CYANOCOBALAMIN 1000 MCG/ML IJ SOLN: 1000.0000 ug | Freq: Once | INTRAMUSCULAR | Status: DC

## 2011-10-25 NOTE — Progress Notes (Signed)
Pt tolerated injection well.  He will return in one month for next injection.

## 2011-10-27 ENCOUNTER — Encounter (HOSPITAL_COMMUNITY): Payer: Self-pay

## 2011-10-28 ENCOUNTER — Encounter: Payer: Medicare Other | Admitting: *Deleted

## 2011-10-29 ENCOUNTER — Encounter (HOSPITAL_COMMUNITY): Payer: Self-pay

## 2011-11-01 ENCOUNTER — Encounter (HOSPITAL_COMMUNITY): Payer: Self-pay

## 2011-11-03 ENCOUNTER — Encounter (HOSPITAL_COMMUNITY): Payer: Self-pay

## 2011-11-05 ENCOUNTER — Encounter (HOSPITAL_COMMUNITY)
Admission: RE | Admit: 2011-11-05 | Discharge: 2011-11-05 | Disposition: A | Discharge status: Home / Self Care | Payer: Self-pay | Source: Ambulatory Visit | Attending: Cardiology | Admitting: Cardiology

## 2011-11-08 ENCOUNTER — Encounter (HOSPITAL_COMMUNITY): Payer: Self-pay

## 2011-11-08 ENCOUNTER — Other Ambulatory Visit: Payer: Self-pay | Admitting: *Deleted

## 2011-11-08 MED ORDER — WARFARIN SODIUM 1 MG PO TABS: 1.0000 mg | ORAL_TABLET | ORAL | Status: DC

## 2011-11-10 ENCOUNTER — Encounter (HOSPITAL_COMMUNITY): Payer: Self-pay

## 2011-11-12 ENCOUNTER — Encounter (HOSPITAL_COMMUNITY): Payer: Self-pay

## 2011-11-15 ENCOUNTER — Encounter (HOSPITAL_COMMUNITY)
Admission: RE | Admit: 2011-11-15 | Discharge: 2011-11-15 | Disposition: A | Discharge status: Home / Self Care | Payer: Self-pay | Source: Ambulatory Visit | Attending: Cardiology | Admitting: Cardiology

## 2011-11-17 ENCOUNTER — Encounter (HOSPITAL_COMMUNITY)
Admission: RE | Admit: 2011-11-17 | Discharge: 2011-11-17 | Disposition: A | Discharge status: Home / Self Care | Payer: Self-pay | Source: Ambulatory Visit | Attending: Cardiology | Admitting: Cardiology

## 2011-11-19 ENCOUNTER — Encounter (HOSPITAL_COMMUNITY)
Admission: RE | Admit: 2011-11-19 | Discharge: 2011-11-19 | Disposition: A | Discharge status: Home / Self Care | Payer: Self-pay | Source: Ambulatory Visit | Attending: Cardiology | Admitting: Cardiology

## 2011-11-22 ENCOUNTER — Encounter (HOSPITAL_COMMUNITY): Payer: Medicare Other

## 2011-11-22 DIAGNOSIS — I10 Essential (primary) hypertension: Secondary | ICD-10-CM | POA: Insufficient documentation

## 2011-11-22 DIAGNOSIS — Z951 Presence of aortocoronary bypass graft: Secondary | ICD-10-CM | POA: Insufficient documentation

## 2011-11-22 DIAGNOSIS — Z5189 Encounter for other specified aftercare: Secondary | ICD-10-CM | POA: Insufficient documentation

## 2011-11-22 DIAGNOSIS — F17200 Nicotine dependence, unspecified, uncomplicated: Secondary | ICD-10-CM | POA: Insufficient documentation

## 2011-11-22 DIAGNOSIS — Z950 Presence of cardiac pacemaker: Secondary | ICD-10-CM | POA: Insufficient documentation

## 2011-11-22 DIAGNOSIS — I4891 Unspecified atrial fibrillation: Secondary | ICD-10-CM | POA: Insufficient documentation

## 2011-11-22 DIAGNOSIS — Z9861 Coronary angioplasty status: Secondary | ICD-10-CM | POA: Insufficient documentation

## 2011-11-22 DIAGNOSIS — I252 Old myocardial infarction: Secondary | ICD-10-CM | POA: Insufficient documentation

## 2011-11-22 DIAGNOSIS — I2510 Atherosclerotic heart disease of native coronary artery without angina pectoris: Secondary | ICD-10-CM | POA: Insufficient documentation

## 2011-11-22 DIAGNOSIS — E7800 Pure hypercholesterolemia, unspecified: Secondary | ICD-10-CM | POA: Insufficient documentation

## 2011-11-24 ENCOUNTER — Encounter (HOSPITAL_COMMUNITY): Payer: Medicare Other

## 2011-11-25 ENCOUNTER — Ambulatory Visit (INDEPENDENT_AMBULATORY_CARE_PROVIDER_SITE_OTHER): Payer: Medicare Other | Admitting: Internal Medicine

## 2011-11-25 ENCOUNTER — Encounter: Payer: Self-pay | Admitting: Internal Medicine

## 2011-11-25 ENCOUNTER — Ambulatory Visit: Payer: Medicare Other

## 2011-11-25 ENCOUNTER — Telehealth: Payer: Self-pay | Admitting: *Deleted

## 2011-11-25 VITALS — BP 140/82 | HR 86 | Temp 98.0°F | Resp 16 | Wt 160.0 lb

## 2011-11-25 DIAGNOSIS — I4891 Unspecified atrial fibrillation: Secondary | ICD-10-CM

## 2011-11-25 DIAGNOSIS — Z23 Encounter for immunization: Secondary | ICD-10-CM

## 2011-11-25 DIAGNOSIS — E785 Hyperlipidemia, unspecified: Secondary | ICD-10-CM

## 2011-11-25 DIAGNOSIS — D518 Other vitamin B12 deficiency anemias: Secondary | ICD-10-CM

## 2011-11-25 MED ORDER — FLUTICASONE PROPIONATE 50 MCG/ACT NA SUSP: 2.0000 | Freq: Every day | NASAL | Status: DC

## 2011-11-25 MED ORDER — CYANOCOBALAMIN 1000 MCG/ML IJ SOLN
1000.0000 ug | Freq: Once | INTRAMUSCULAR | Status: AC
  Administered 2012-03-21: 1000 ug via INTRAMUSCULAR

## 2011-11-25 NOTE — Progress Notes (Signed)
Subjective:    Patient ID: Dwayne [name], male    DOB: 10/22/1932, 77 y.o.   MRN: [id number]  Hyperlipidemia This is a chronic problem. The current episode started more than 1 year ago. The problem is controlled. Recent lipid tests were reviewed and are variable. He has no history of chronic renal disease, diabetes, hypothyroidism, liver disease, obesity or nephrotic syndrome. Factors aggravating his hyperlipidemia include no known factors. Pertinent negatives include no chest pain, focal sensory loss, focal weakness, leg pain, myalgias or shortness of breath. Current antihyperlipidemic treatment includes statins. The current treatment provides significant improvement of lipids. There are no compliance problems.   Anemia Presents for follow-up visit. There has been no abdominal pain, anorexia, bruising/bleeding easily, confusion, fever, leg swelling, light-headedness, malaise/fatigue, pallor, palpitations, paresthesias, pica or weight loss. Signs of blood loss that are not present include hematemesis, hematochezia and melena. There is no history of chronic renal disease or hypothyroidism. There are no compliance problems.  Compliance with medications is 76-100%.      Review of Systems  Constitutional: Negative.  Negative for fever, weight loss and malaise/fatigue.  HENT: Negative.   Eyes: Negative.   Respiratory: Negative.  Negative for shortness of breath.   Cardiovascular: Negative.  Negative for chest pain and palpitations.  Gastrointestinal: Negative.  Negative for abdominal pain, melena, hematochezia, anorexia and hematemesis.  Genitourinary: Negative.   Musculoskeletal: Negative.  Negative for myalgias.  Skin: Negative for pallor.  Neurological: Negative.  Negative for focal weakness, light-headedness and paresthesias.  Hematological: Negative.  Does not bruise/bleed easily.  Psychiatric/Behavioral: Negative.  Negative for confusion.       Objective:   Physical Exam  Vitals  reviewed. Constitutional: He appears well-developed and well-nourished. No distress.  HENT:  Head: Normocephalic and atraumatic.  Mouth/Throat: Oropharynx is clear and moist. No oropharyngeal exudate.  Eyes: Conjunctivae are normal. Right eye exhibits no discharge. Left eye exhibits no discharge. No scleral icterus.  Neck: Normal range of motion. Neck supple. No JVD present. No tracheal deviation present. No thyromegaly present.  Cardiovascular: Normal rate, regular rhythm, normal heart sounds and intact distal pulses.  Exam reveals no gallop and no friction rub.   No murmur heard. Pulmonary/Chest: Effort normal and breath sounds normal. No stridor. No respiratory distress. He has no wheezes. He has no rales. He exhibits no tenderness.  Abdominal: Soft. Bowel sounds are normal. He exhibits no distension and no mass. There is no tenderness. There is no rebound and no guarding.  Musculoskeletal: Normal range of motion. He exhibits no edema and no tenderness.  Lymphadenopathy:    He has no cervical adenopathy.  Skin: Skin is warm and dry. No rash noted. He is not diaphoretic. No erythema. No pallor.  Psychiatric: He has a normal mood and affect. His behavior is normal. Judgment and thought content normal.     Lab Results  Component Value Date   WBC 7.2 07/20/2011   HGB 12.8* 07/20/2011   HCT 42.2 07/20/2011   PLT 246.0 07/20/2011   GLUCOSE 80 07/20/2011   CHOL 114 07/20/2011   TRIG 125.0 07/20/2011   HDL 43.40 07/20/2011   LDLCALC 46 07/20/2011   ALT 23 07/20/2011   AST 25 07/20/2011   NA 141 07/20/2011   K 4.3 07/20/2011   CL 108 07/20/2011   CREATININE 0.7 07/20/2011   BUN 10 07/20/2011   CO2 30 07/20/2011   TSH 1.07 03/03/2011   INR 2.5 10/21/2011       Assessment & Plan:

## 2011-11-25 NOTE — Telephone Encounter (Signed)
Keep an eye out for allergic reaction (pain redness swelling) but otherwise he should be fine

## 2011-11-25 NOTE — Assessment & Plan Note (Signed)
He has good rate and rhythm control, he will continue current meds and anticoagulation

## 2011-11-25 NOTE — Patient Instructions (Signed)
Hypercholesterolemia High Blood Cholesterol Cholesterol is a white, waxy, fat-like protein needed by your body in small amounts. The liver makes all the cholesterol you need. It is carried from the liver by the blood through the blood vessels. Deposits (plaque) may build up on blood vessel walls. This makes the arteries narrower and stiffer. Plaque increases the risk for heart attack and stroke. You cannot feel your cholesterol level even if it is very high. The only way to know is by a blood test to check your lipid (fats) levels. Once you know your cholesterol levels, you should keep a record of the test results. Work with your caregiver to to keep your levels in the desired range. WHAT THE RESULTS MEAN:  Total cholesterol is a rough measure of all the cholesterol in your blood.   LDL is the so-called bad cholesterol. This is the type that deposits cholesterol in the walls of the arteries. You want this level to be low.   HDL is the good cholesterol because it cleans the arteries and carries the LDL away. You want this level to be high.   Triglycerides are fat that the body can either burn for energy or store. High levels are closely linked to heart disease.  DESIRED LEVELS:  Total cholesterol below 200.   LDL below 100 for people at risk, below 70 for very high risk.   HDL above 50 is good, above 60 is best.   Triglycerides below 150.  HOW TO LOWER YOUR CHOLESTEROL:  Diet.   Choose fish or white meat chicken and [place], roasted or baked. Limit fatty cuts of red meat, fried foods, and processed meats, such as sausage and lunch meat.   Eat lots of fresh fruits and vegetables. Choose whole grains, beans, pasta, potatoes and cereals.   Use only small amounts of olive, corn or canola oils. Avoid butter, mayonnaise, shortening or palm kernel oils. Avoid foods with trans-fats.   Use skim/nonfat milk and low-fat/nonfat yogurt and cheeses. Avoid whole milk, cream, ice cream, egg yolks and  cheeses. Healthy desserts include angel food cake, gingersnaps, animal crackers, hard candy, popsicles, and low-fat/nonfat frozen yogurt. Avoid pastries, cakes, pies and cookies.   Exercise.   A regular program helps decrease LDL and raises HDL.   Helps with weight control.   Do things that increase your activity level like gardening, walking, or taking the stairs.   Medication.   May be prescribed by your caregiver to help lowering cholesterol and the risk for heart disease.   You may need medicine even if your levels are normal if you have several risk factors.  HOME CARE INSTRUCTIONS   Follow your diet and exercise programs as suggested by your caregiver.   Take medications as directed.   Have blood work done when your caregiver feels it is necessary.  MAKE SURE YOU:   Understand these instructions.   Will watch your condition.   Will get help right away if you are not doing well or get worse.  Document Released: 12/06/2005 Document Revised: 08/18/2011 Document Reviewed: 05/24/2007 [facility] Patient Information 2012 [place], [place].Pernicious Anemia Pernicious anemia may be an immune system illness. It causes the production of antibodies to cells of the stomach (parietal cells), and proteins produced by the stomach, which are needed to absorb vitamin B12. The result of this illness is the body does not absorb enough B12 from the diet. This leads to lessened red blood cell production which causes anemia. Vitamin B12 is needed for making  red blood cells and keeping the nervous system healthy. Not enough vitamin B12 in your system slowly affects sensory and motor nerves. This causes problems with your nervous system (neurological) to develop over time. Neurological effects of vitamin B12 deficiency may be seen before anemia is diagnosed. This affects both men and women, between ages 85 and 8. The anemia also affects the bowel, the heart and vascular systems and cannot be  prevented. CAUSES  Pernicious anemia is due to a lack of substance called intrinsic factor. This is a substance made by cells in the stomach. It makes it possible to absorb vitamin B12. The reason for the lack of this substance is unknown but it may be autoimmune, genetic, or both. SYMPTOMS  The following problems may be seen with this illness:  Problems develop slowly.   Rapid heart rate.   Nausea, appetite loss, and weight loss.   Difficulty maintaining proper balance.   Yellow eyes and skin.   Loss of deep tendon reflexes.   Depression.   Confusion, poor memory, and dementia.   Ringing in the ears (tinnitus).   Weakness, especially in the arms and legs.   Sore tongue.   Numbness or tingling in the hands and feet.   Pale lips, tongue, and gums.   Bleeding gums.   Shortness of breath.   Headache.   Fatigue.  RISK OF VITAMIN B12 DEFICIENCY INCREASES WITH:  Diseases or surgery affecting the stomach.   Diabetes and autoimmune disorders. Autoimmune disorders are diseases where the body makes antibodies which attack your own body tissues.   Thyroid disorders.   Genetic factors, such as in people of Northern European ancestry. It is rare in African Americans and Asians.   Family history of pernicious anemia.   Age over 30.   Strict vegetarian diet or infants breast-fed by a mother on a strict vegetarian diet.   Lack of stomach acid in older adults.   Parasitic infections and intestinal diseases.   Drugs such as H2 blockers, proton pump inhibitors, colchicine, neomycin, and aminosalicylic acid.   Alcoholism.  PREVENTION  Pernicious anemia cannot be prevented but vitamin B12 deficiencies can be prevented.   For pernicious anemia, lifelong vitamin B12 therapy will help symptoms and prevent complications.   Dietary changes can prevent deficiency. B12 is mostly from animal sources so a deficiency is more likely in a vegetarian who does not eat eggs or dairy  products.  RELATED COMPLICATIONS  Heart failure.   Nerve damage that can not be reversed.   Gastric cancer.  DIAGNOSIS  Your caregiver can determine what is wrong by:  Doing blood tests for vitamin B12 levels.   Checking for antibodies to the intrinsic factor.   Measuring the body's ability to absorb vitamin B12.  TREATMENT   Life long treatment usually involves vitamin B12 replacement. Monthly vitamin B12 injections are the treatment of choice to correct vitamin B12 deficiency and may be given by the patient. This therapy corrects the anemia and it may correct the neurological complications if given early enough. About 1% of vitamin B12 is absorbed (even in the absence of intrinsic factor) so some caregivers recommend that elderly patients with gastric atrophy take oral vitamin B12 supplements in addition to monthly injections.   Some symptoms should start to clear up in a few days after treatment begins but other symptoms may take several months.   Additionally, other conditions which may lead to a deficiency should be treated.   Stop drinking alcohol if alcoholism  led to the vitamin B12 deficiency.   For other patients, the vitamin may be taken by mouth or as a nasal gel (or in addition to injections).   Iron supplements may be prescribed.   Avoid taking high amounts of folic acid. It can mask the signs of vitamin B12 deficiency.   Activity may be limited until symptoms improve.   Eat a well-balanced diet.   People on strict vegetarian diets can change their diet or take vitamin B12 supplements for life.  PROGNOSIS   When caught early, the prognosis is good. Most people will do well.   Patients with this illness have a higher incidence of cancer and polyps of the stomach.   Nervous system problems may not improve if treatment does not start soon enough.  Document Released: 02/26/2004 Document Revised: 08/18/2011 Document Reviewed: 12/03/2008 [facility] Patient  Information 2012 [place], [place].

## 2011-11-25 NOTE — Assessment & Plan Note (Signed)
He is going well on crestor

## 2011-11-25 NOTE — Telephone Encounter (Signed)
Pt concerned because he received a flu shot a month ago at the pharmacy and received one today here at the office. Pt is concerned

## 2011-11-25 NOTE — Telephone Encounter (Signed)
Informed pt .

## 2011-11-25 NOTE — Assessment & Plan Note (Signed)
Continue monthly B12 injections

## 2011-11-26 ENCOUNTER — Encounter (HOSPITAL_COMMUNITY): Payer: Medicare Other

## 2011-11-29 ENCOUNTER — Encounter (HOSPITAL_COMMUNITY)
Admission: RE | Admit: 2011-11-29 | Discharge: 2011-11-29 | Disposition: A | Discharge status: Home / Self Care | Payer: Medicare Other | Source: Ambulatory Visit | Attending: Cardiology | Admitting: Cardiology

## 2011-11-30 ENCOUNTER — Ambulatory Visit (INDEPENDENT_AMBULATORY_CARE_PROVIDER_SITE_OTHER): Payer: Medicare Other | Admitting: *Deleted

## 2011-11-30 DIAGNOSIS — I4891 Unspecified atrial fibrillation: Secondary | ICD-10-CM

## 2011-11-30 DIAGNOSIS — Z7901 Long term (current) use of anticoagulants: Secondary | ICD-10-CM

## 2011-12-01 ENCOUNTER — Encounter (HOSPITAL_COMMUNITY)
Admission: RE | Admit: 2011-12-01 | Discharge: 2011-12-01 | Disposition: A | Discharge status: Home / Self Care | Payer: Medicare Other | Source: Ambulatory Visit | Attending: Cardiology | Admitting: Cardiology

## 2011-12-02 ENCOUNTER — Encounter: Payer: Medicare Other | Admitting: *Deleted

## 2011-12-03 ENCOUNTER — Encounter (HOSPITAL_COMMUNITY): Payer: Medicare Other

## 2011-12-06 ENCOUNTER — Encounter (HOSPITAL_COMMUNITY)
Admission: RE | Admit: 2011-12-06 | Discharge: 2011-12-06 | Disposition: A | Discharge status: Home / Self Care | Payer: Medicare Other | Source: Ambulatory Visit | Attending: Cardiology | Admitting: Cardiology

## 2011-12-07 ENCOUNTER — Telehealth: Payer: Self-pay

## 2011-12-07 NOTE — Telephone Encounter (Signed)
Patient called [facility] requesting a call back regarding a medication refill

## 2011-12-08 ENCOUNTER — Encounter (HOSPITAL_COMMUNITY)
Admission: RE | Admit: 2011-12-08 | Discharge: 2011-12-08 | Disposition: A | Discharge status: Home / Self Care | Payer: Medicare Other | Source: Ambulatory Visit | Attending: Cardiology | Admitting: Cardiology

## 2011-12-08 ENCOUNTER — Telehealth: Payer: Self-pay

## 2011-12-08 MED ORDER — FINASTERIDE 5 MG PO TABS: 5.0000 mg | ORAL_TABLET | Freq: Every day | ORAL | Status: DC

## 2011-12-08 NOTE — Telephone Encounter (Signed)
refill 

## 2011-12-10 ENCOUNTER — Encounter (HOSPITAL_COMMUNITY)
Admission: RE | Admit: 2011-12-10 | Discharge: 2011-12-10 | Disposition: A | Discharge status: Home / Self Care | Payer: Medicare Other | Source: Ambulatory Visit | Attending: Cardiology | Admitting: Cardiology

## 2011-12-13 ENCOUNTER — Encounter (HOSPITAL_COMMUNITY): Payer: Medicare Other

## 2011-12-15 ENCOUNTER — Encounter (HOSPITAL_COMMUNITY): Payer: Medicare Other

## 2011-12-17 ENCOUNTER — Encounter (HOSPITAL_COMMUNITY): Payer: Medicare Other

## 2011-12-20 ENCOUNTER — Encounter (HOSPITAL_COMMUNITY): Payer: Medicare Other

## 2011-12-22 ENCOUNTER — Encounter (HOSPITAL_COMMUNITY): Payer: Self-pay

## 2011-12-24 ENCOUNTER — Encounter (HOSPITAL_COMMUNITY): Payer: Self-pay

## 2011-12-24 DIAGNOSIS — E7800 Pure hypercholesterolemia, unspecified: Secondary | ICD-10-CM | POA: Insufficient documentation

## 2011-12-24 DIAGNOSIS — Z5189 Encounter for other specified aftercare: Secondary | ICD-10-CM | POA: Insufficient documentation

## 2011-12-24 DIAGNOSIS — I4891 Unspecified atrial fibrillation: Secondary | ICD-10-CM | POA: Insufficient documentation

## 2011-12-24 DIAGNOSIS — I2510 Atherosclerotic heart disease of native coronary artery without angina pectoris: Secondary | ICD-10-CM | POA: Insufficient documentation

## 2011-12-24 DIAGNOSIS — Z951 Presence of aortocoronary bypass graft: Secondary | ICD-10-CM | POA: Insufficient documentation

## 2011-12-24 DIAGNOSIS — I252 Old myocardial infarction: Secondary | ICD-10-CM | POA: Insufficient documentation

## 2011-12-24 DIAGNOSIS — Z950 Presence of cardiac pacemaker: Secondary | ICD-10-CM | POA: Insufficient documentation

## 2011-12-24 DIAGNOSIS — F17200 Nicotine dependence, unspecified, uncomplicated: Secondary | ICD-10-CM | POA: Insufficient documentation

## 2011-12-24 DIAGNOSIS — I10 Essential (primary) hypertension: Secondary | ICD-10-CM | POA: Insufficient documentation

## 2011-12-24 DIAGNOSIS — Z9861 Coronary angioplasty status: Secondary | ICD-10-CM | POA: Insufficient documentation

## 2011-12-27 ENCOUNTER — Encounter (HOSPITAL_COMMUNITY): Payer: Self-pay

## 2011-12-28 ENCOUNTER — Ambulatory Visit (INDEPENDENT_AMBULATORY_CARE_PROVIDER_SITE_OTHER): Payer: Medicare Other

## 2011-12-28 DIAGNOSIS — D518 Other vitamin B12 deficiency anemias: Secondary | ICD-10-CM

## 2011-12-28 DIAGNOSIS — Z23 Encounter for immunization: Secondary | ICD-10-CM

## 2011-12-28 MED ORDER — CYANOCOBALAMIN 1000 MCG/ML IJ SOLN
1000.0000 ug | Freq: Once | INTRAMUSCULAR | Status: AC
  Administered 2011-12-28: 1000 ug via INTRAMUSCULAR

## 2011-12-29 ENCOUNTER — Encounter (HOSPITAL_COMMUNITY): Payer: Self-pay

## 2011-12-30 DIAGNOSIS — M545 Low back pain: Secondary | ICD-10-CM | POA: Diagnosis not present

## 2011-12-30 DIAGNOSIS — M5137 Other intervertebral disc degeneration, lumbosacral region: Secondary | ICD-10-CM | POA: Diagnosis not present

## 2011-12-30 DIAGNOSIS — M1610 Unilateral primary osteoarthritis, unspecified hip: Secondary | ICD-10-CM | POA: Diagnosis not present

## 2011-12-30 DIAGNOSIS — M810 Age-related osteoporosis without current pathological fracture: Secondary | ICD-10-CM | POA: Diagnosis not present

## 2011-12-31 ENCOUNTER — Encounter (HOSPITAL_COMMUNITY)
Admission: RE | Admit: 2011-12-31 | Discharge: 2011-12-31 | Disposition: A | Discharge status: Home / Self Care | Payer: Self-pay | Source: Ambulatory Visit | Attending: Cardiology | Admitting: Cardiology

## 2012-01-03 ENCOUNTER — Encounter (HOSPITAL_COMMUNITY)
Admission: RE | Admit: 2012-01-03 | Discharge: 2012-01-03 | Disposition: A | Discharge status: Home / Self Care | Payer: Self-pay | Source: Ambulatory Visit | Attending: Cardiology | Admitting: Cardiology

## 2012-01-04 ENCOUNTER — Ambulatory Visit (INDEPENDENT_AMBULATORY_CARE_PROVIDER_SITE_OTHER): Payer: Medicare Other | Admitting: *Deleted

## 2012-01-04 DIAGNOSIS — I4891 Unspecified atrial fibrillation: Secondary | ICD-10-CM

## 2012-01-04 DIAGNOSIS — Z7901 Long term (current) use of anticoagulants: Secondary | ICD-10-CM | POA: Diagnosis not present

## 2012-01-05 ENCOUNTER — Encounter (HOSPITAL_COMMUNITY)
Admission: RE | Admit: 2012-01-05 | Discharge: 2012-01-05 | Disposition: A | Discharge status: Home / Self Care | Payer: Self-pay | Source: Ambulatory Visit | Attending: Cardiology | Admitting: Cardiology

## 2012-01-07 ENCOUNTER — Encounter (HOSPITAL_COMMUNITY): Payer: Self-pay

## 2012-01-10 ENCOUNTER — Encounter (HOSPITAL_COMMUNITY)
Admission: RE | Admit: 2012-01-10 | Discharge: 2012-01-10 | Disposition: A | Discharge status: Home / Self Care | Payer: Self-pay | Source: Ambulatory Visit | Attending: Cardiology | Admitting: Cardiology

## 2012-01-12 ENCOUNTER — Encounter (HOSPITAL_COMMUNITY)
Admission: RE | Admit: 2012-01-12 | Discharge: 2012-01-12 | Disposition: A | Discharge status: Home / Self Care | Payer: Self-pay | Source: Ambulatory Visit | Attending: Cardiology | Admitting: Cardiology

## 2012-01-14 ENCOUNTER — Encounter (HOSPITAL_COMMUNITY): Payer: Self-pay

## 2012-01-17 ENCOUNTER — Encounter (HOSPITAL_COMMUNITY)
Admission: RE | Admit: 2012-01-17 | Discharge: 2012-01-17 | Disposition: A | Discharge status: Home / Self Care | Payer: Self-pay | Source: Ambulatory Visit | Attending: Cardiology | Admitting: Cardiology

## 2012-01-18 ENCOUNTER — Ambulatory Visit (INDEPENDENT_AMBULATORY_CARE_PROVIDER_SITE_OTHER): Payer: Medicare Other

## 2012-01-18 DIAGNOSIS — D518 Other vitamin B12 deficiency anemias: Secondary | ICD-10-CM | POA: Diagnosis not present

## 2012-01-19 ENCOUNTER — Encounter (HOSPITAL_COMMUNITY)
Admission: RE | Admit: 2012-01-19 | Discharge: 2012-01-19 | Disposition: A | Discharge status: Home / Self Care | Payer: Self-pay | Source: Ambulatory Visit | Attending: Cardiology | Admitting: Cardiology

## 2012-01-19 DIAGNOSIS — D518 Other vitamin B12 deficiency anemias: Secondary | ICD-10-CM | POA: Diagnosis not present

## 2012-01-19 MED ORDER — CYANOCOBALAMIN 1000 MCG/ML IJ SOLN
1000.0000 ug | Freq: Once | INTRAMUSCULAR | Status: AC
  Administered 2012-01-19: 1000 ug via INTRAMUSCULAR

## 2012-01-20 ENCOUNTER — Encounter: Payer: Medicare Other | Admitting: *Deleted

## 2012-01-21 ENCOUNTER — Encounter (HOSPITAL_COMMUNITY)
Admission: RE | Admit: 2012-01-21 | Discharge: 2012-01-21 | Disposition: A | Discharge status: Home / Self Care | Payer: Self-pay | Source: Ambulatory Visit | Attending: Cardiology | Admitting: Cardiology

## 2012-01-21 DIAGNOSIS — Z5189 Encounter for other specified aftercare: Secondary | ICD-10-CM | POA: Insufficient documentation

## 2012-01-21 DIAGNOSIS — I252 Old myocardial infarction: Secondary | ICD-10-CM | POA: Insufficient documentation

## 2012-01-21 DIAGNOSIS — E7800 Pure hypercholesterolemia, unspecified: Secondary | ICD-10-CM | POA: Insufficient documentation

## 2012-01-21 DIAGNOSIS — Z9861 Coronary angioplasty status: Secondary | ICD-10-CM | POA: Insufficient documentation

## 2012-01-21 DIAGNOSIS — I2510 Atherosclerotic heart disease of native coronary artery without angina pectoris: Secondary | ICD-10-CM | POA: Insufficient documentation

## 2012-01-21 DIAGNOSIS — Z950 Presence of cardiac pacemaker: Secondary | ICD-10-CM | POA: Insufficient documentation

## 2012-01-21 DIAGNOSIS — F17200 Nicotine dependence, unspecified, uncomplicated: Secondary | ICD-10-CM | POA: Insufficient documentation

## 2012-01-21 DIAGNOSIS — Z951 Presence of aortocoronary bypass graft: Secondary | ICD-10-CM | POA: Insufficient documentation

## 2012-01-21 DIAGNOSIS — I4891 Unspecified atrial fibrillation: Secondary | ICD-10-CM | POA: Insufficient documentation

## 2012-01-21 DIAGNOSIS — I10 Essential (primary) hypertension: Secondary | ICD-10-CM | POA: Insufficient documentation

## 2012-01-24 ENCOUNTER — Encounter (HOSPITAL_COMMUNITY)
Admission: RE | Admit: 2012-01-24 | Discharge: 2012-01-24 | Disposition: A | Discharge status: Home / Self Care | Payer: Self-pay | Source: Ambulatory Visit | Attending: Cardiology | Admitting: Cardiology

## 2012-01-24 ENCOUNTER — Encounter: Payer: Self-pay | Admitting: *Deleted

## 2012-01-26 ENCOUNTER — Telehealth: Payer: Self-pay | Admitting: Cardiology

## 2012-01-26 ENCOUNTER — Encounter (HOSPITAL_COMMUNITY)
Admission: RE | Admit: 2012-01-26 | Discharge: 2012-01-26 | Disposition: A | Discharge status: Home / Self Care | Payer: Self-pay | Source: Ambulatory Visit | Attending: Cardiology | Admitting: Cardiology

## 2012-01-26 NOTE — Telephone Encounter (Signed)
Walk in Pt Form " Pt needs to Know if he can be put on Prolia" Please  Acknowledge  01/26/12/KM

## 2012-01-28 ENCOUNTER — Encounter (HOSPITAL_COMMUNITY)
Admission: RE | Admit: 2012-01-28 | Discharge: 2012-01-28 | Disposition: A | Discharge status: Home / Self Care | Payer: Self-pay | Source: Ambulatory Visit | Attending: Cardiology | Admitting: Cardiology

## 2012-01-31 ENCOUNTER — Encounter: Payer: Self-pay | Admitting: Internal Medicine

## 2012-01-31 ENCOUNTER — Encounter (HOSPITAL_COMMUNITY)
Admission: RE | Admit: 2012-01-31 | Discharge: 2012-01-31 | Disposition: A | Discharge status: Home / Self Care | Payer: Self-pay | Source: Ambulatory Visit | Attending: Cardiology | Admitting: Cardiology

## 2012-01-31 ENCOUNTER — Telehealth: Payer: Self-pay | Admitting: Internal Medicine

## 2012-01-31 ENCOUNTER — Ambulatory Visit (INDEPENDENT_AMBULATORY_CARE_PROVIDER_SITE_OTHER): Payer: Medicare Other | Admitting: *Deleted

## 2012-01-31 DIAGNOSIS — I495 Sick sinus syndrome: Secondary | ICD-10-CM | POA: Diagnosis not present

## 2012-01-31 MED ORDER — METOPROLOL TARTRATE 25 MG PO TABS: 25.0000 mg | ORAL_TABLET | Freq: Two times a day (BID) | ORAL | Status: DC

## 2012-01-31 NOTE — Telephone Encounter (Signed)
Spoke with patient.  He will send transmission today.  Advised we will call back tomorrow if not received.  Gypsy Balsam, RN, BSN 01/31/2012 3:17 PM

## 2012-01-31 NOTE — Telephone Encounter (Signed)
New Msg: pt calling wanting to speak with someone regarding pt sending remote device check. Please return pt call to discuss further.

## 2012-02-01 ENCOUNTER — Other Ambulatory Visit: Payer: Self-pay | Admitting: *Deleted

## 2012-02-01 MED ORDER — METOPROLOL TARTRATE 25 MG PO TABS: 25.0000 mg | ORAL_TABLET | Freq: Two times a day (BID) | ORAL | Status: DC

## 2012-02-02 ENCOUNTER — Encounter (HOSPITAL_COMMUNITY)
Admission: RE | Admit: 2012-02-02 | Discharge: 2012-02-02 | Disposition: A | Discharge status: Home / Self Care | Payer: Self-pay | Source: Ambulatory Visit | Attending: Cardiology | Admitting: Cardiology

## 2012-02-04 ENCOUNTER — Encounter (HOSPITAL_COMMUNITY)
Admission: RE | Admit: 2012-02-04 | Discharge: 2012-02-04 | Disposition: A | Discharge status: Home / Self Care | Payer: Self-pay | Source: Ambulatory Visit | Attending: Cardiology | Admitting: Cardiology

## 2012-02-06 LAB — REMOTE PACEMAKER DEVICE
AL AMPLITUDE: 2.8 mv
ATRIAL PACING PM: 38
BRDY-0002RV: 60 {beats}/min
RV LEAD IMPEDENCE PM: 463 Ohm

## 2012-02-07 ENCOUNTER — Ambulatory Visit (INDEPENDENT_AMBULATORY_CARE_PROVIDER_SITE_OTHER): Payer: Medicare Other | Admitting: *Deleted

## 2012-02-07 ENCOUNTER — Encounter (HOSPITAL_COMMUNITY)
Admission: RE | Admit: 2012-02-07 | Discharge: 2012-02-07 | Disposition: A | Discharge status: Home / Self Care | Payer: Self-pay | Source: Ambulatory Visit | Attending: Cardiology | Admitting: Cardiology

## 2012-02-07 DIAGNOSIS — I4891 Unspecified atrial fibrillation: Secondary | ICD-10-CM

## 2012-02-07 DIAGNOSIS — Z7901 Long term (current) use of anticoagulants: Secondary | ICD-10-CM

## 2012-02-07 LAB — POCT INR: INR: 2.7

## 2012-02-08 ENCOUNTER — Ambulatory Visit (INDEPENDENT_AMBULATORY_CARE_PROVIDER_SITE_OTHER): Payer: Medicare Other

## 2012-02-08 DIAGNOSIS — D518 Other vitamin B12 deficiency anemias: Secondary | ICD-10-CM | POA: Diagnosis not present

## 2012-02-08 MED ORDER — CYANOCOBALAMIN 1000 MCG/ML IJ SOLN
1000.0000 ug | Freq: Once | INTRAMUSCULAR | Status: AC
  Administered 2012-02-08: 1000 ug via INTRAMUSCULAR

## 2012-02-09 ENCOUNTER — Encounter (HOSPITAL_COMMUNITY): Payer: Self-pay

## 2012-02-10 ENCOUNTER — Other Ambulatory Visit: Payer: Self-pay | Admitting: Cardiology

## 2012-02-10 MED ORDER — METOPROLOL TARTRATE 25 MG PO TABS: 25.0000 mg | ORAL_TABLET | Freq: Two times a day (BID) | ORAL | Status: DC

## 2012-02-10 NOTE — Telephone Encounter (Signed)
New msg Pt said he usually gets thru [facility]- He wants just enough metoprolol tartrate to get until he get mailorder he wants called to [facility] on [address]. He is out of pills. Please call when done

## 2012-02-11 ENCOUNTER — Encounter (HOSPITAL_COMMUNITY): Payer: Self-pay

## 2012-02-11 NOTE — Progress Notes (Signed)
PPM remote 

## 2012-02-14 ENCOUNTER — Encounter (HOSPITAL_COMMUNITY): Payer: Self-pay

## 2012-02-16 ENCOUNTER — Encounter (HOSPITAL_COMMUNITY): Payer: Medicare Other

## 2012-02-16 ENCOUNTER — Encounter: Payer: Self-pay | Admitting: *Deleted

## 2012-02-16 DIAGNOSIS — J00 Acute nasopharyngitis [common cold]: Secondary | ICD-10-CM | POA: Diagnosis not present

## 2012-02-17 ENCOUNTER — Ambulatory Visit (INDEPENDENT_AMBULATORY_CARE_PROVIDER_SITE_OTHER): Payer: Medicare Other | Admitting: Cardiology

## 2012-02-17 ENCOUNTER — Encounter: Payer: Self-pay | Admitting: Cardiology

## 2012-02-17 VITALS — BP 148/86 | HR 69 | Ht 70.0 in | Wt 169.8 lb

## 2012-02-17 DIAGNOSIS — Z7901 Long term (current) use of anticoagulants: Secondary | ICD-10-CM | POA: Diagnosis not present

## 2012-02-17 DIAGNOSIS — E785 Hyperlipidemia, unspecified: Secondary | ICD-10-CM

## 2012-02-17 DIAGNOSIS — I25810 Atherosclerosis of coronary artery bypass graft(s) without angina pectoris: Secondary | ICD-10-CM

## 2012-02-17 DIAGNOSIS — I4891 Unspecified atrial fibrillation: Secondary | ICD-10-CM

## 2012-02-17 NOTE — Assessment & Plan Note (Signed)
Reviewed

## 2012-02-17 NOTE — Progress Notes (Signed)
HPI:  He is doing well.  No chest pain.  No clinical bleeding of which he is aware.  Not particularly SOB but also not very active.  Feels pretty good.  We discussed use of the monoclonal antibody Prolia, and I made it clear I had no experience with this agent.  I reviewed the information that he provided, and I encouraged him to discuss this with his rheumatologist.    Current Outpatient Prescriptions  Medication Sig Dispense Refill  . alendronate (FOSAMAX) 70 MG tablet Take 70 mg by mouth every 7 (seven) days. Take with a full glass of water on an empty stomach.       [name] Kitchen aspirin 81 MG tablet Take 81 mg by mouth daily.        . Calcium-Magnesium-Vitamin D (CITRACAL CALCIUM+D) 600-40-500 MG-MG-UNIT TB24 Take 1 tablet by mouth 2 (two) times daily.        . fexofenadine (ALLEGRA) 180 MG tablet Take 180 mg by mouth daily.        . finasteride (PROSCAR) 5 MG tablet Take 1 tablet (5 mg total) by mouth daily.  90 tablet  3  . fluticasone (FLONASE) 50 MCG/ACT nasal spray Place 2 sprays into the nose daily.  16 g  11  . metoprolol tartrate (LOPRESSOR) 25 MG tablet Take 1 tablet (25 mg total) by mouth 2 (two) times daily.  180 tablet  6  . omeprazole (PRILOSEC) 40 MG capsule Take 40 mg by mouth daily.        . polyethylene glycol (MIRALAX / GLYCOLAX) packet Take 17 g by mouth daily.      . pregabalin (LYRICA) 75 MG capsule Take 75 mg by mouth daily.        . rosuvastatin (CRESTOR) 10 MG tablet Take 1 tablet (10 mg total) by mouth daily.  90 tablet  3  . terazosin (HYTRIN) 5 MG capsule Take 1 capsule (5 mg total) by mouth daily.  90 capsule  3  . warfarin (COUMADIN) 1 MG tablet Take 1 tablet (1 mg total) by mouth as directed.  90 tablet  2  . zolpidem (AMBIEN) 10 MG tablet Take 10 mg by mouth at bedtime as needed.         Current Facility-Administered Medications  Medication Dose Route Frequency Provider Last Rate Last Dose  . cyanocobalamin ((VITAMIN B-12)) injection 1,000 mcg  1,000 mcg Intramuscular  Once [name], MD        Allergies  Allergen Reactions  . Clindamycin     REACTION: upset stomach  . Doxycycline Hyclate     REACTION: n/v/d  . Erythromycin     REACTION: n/v/d  . Metronidazole     REACTION: rash, itching  . Penicillins     REACTION: hives  . Sulfonamide Derivatives     REACTION: hives  . Tetracycline     REACTION: n/v/d    Past Medical History  Diagnosis Date  . Arrhythmia   . Hyperlipidemia   . Bronchiectasis without acute exacerbation   . Disorders of diaphragm   . Plantar fascial fibromatosis   . Epistaxis   . Unspecified sinusitis (chronic)   . Allergic rhinitis, cause unspecified   . Spinal stenosis, unspecified region other than cervical     Past Surgical History  Procedure Date  . Coronary artery bypass graft   . Insert / replace / remove pacemaker     2007  . Kidney cyst removal   . Doppler echocardiography 2011  No family history on file.  History   Social History  . Marital Status: Married    Spouse Name: N/A    Number of Children: 4  . Years of Education: N/A   Occupational History  .     Social History Main Topics  . Smoking status: Never Smoker   . Smokeless tobacco: Not on file  . Alcohol Use: No  . Drug Use: No  . Sexually Active: Yes   Other Topics Concern  . Not on file   Social History Narrative  . No narrative on file    ROS: Please see the HPI.  All other systems reviewed and negative.  PHYSICAL EXAM:  BP 148/86  Pulse 69  Ht 5\' 10"  (1.778 m)  Wt 169 lb 12.8 oz (77.021 kg)  BMI 24.36 kg/m2  General: Well developed, well nourished, in no acute distress. Head:  Normocephalic and atraumatic. Neck: no JVD Lungs: Clear to auscultation and percussion. Heart: Normal S1 and S2.  No murmur, rubs or gallops.  Abdomen:  Normal bowel sounds; soft; non tender; no organomegaly Pulses: Pulses normal in all 4 extremities. Extremities: No clubbing or cyanosis. No edema. Neurologic: Alert and oriented  x 3.  EKG:  Underlying atrial fibrillation.  V pacing.    ASSESSMENT AND PLAN:

## 2012-02-17 NOTE — Assessment & Plan Note (Signed)
Maintained on rosuvastatin.

## 2012-02-17 NOTE — Patient Instructions (Signed)
Your physician wants you to follow-up in: 6 MONTHS.  You will receive a reminder letter in the mail two months in advance. If you don't receive a letter, please call our office to schedule the follow-up appointment.  Your physician recommends that you continue on your current medications as directed. Please refer to the Current Medication list given to you today.  

## 2012-02-17 NOTE — Assessment & Plan Note (Signed)
No recurrent symptoms at present.  No chest pain.

## 2012-02-17 NOTE — Assessment & Plan Note (Signed)
Back up pacing.  Remains on warfarin.  No clinical bleeding at present.

## 2012-02-18 ENCOUNTER — Encounter (HOSPITAL_COMMUNITY): Payer: Self-pay

## 2012-02-18 DIAGNOSIS — E7800 Pure hypercholesterolemia, unspecified: Secondary | ICD-10-CM | POA: Insufficient documentation

## 2012-02-18 DIAGNOSIS — I2510 Atherosclerotic heart disease of native coronary artery without angina pectoris: Secondary | ICD-10-CM | POA: Insufficient documentation

## 2012-02-18 DIAGNOSIS — Z9861 Coronary angioplasty status: Secondary | ICD-10-CM | POA: Insufficient documentation

## 2012-02-18 DIAGNOSIS — I252 Old myocardial infarction: Secondary | ICD-10-CM | POA: Insufficient documentation

## 2012-02-18 DIAGNOSIS — I10 Essential (primary) hypertension: Secondary | ICD-10-CM | POA: Insufficient documentation

## 2012-02-18 DIAGNOSIS — Z5189 Encounter for other specified aftercare: Secondary | ICD-10-CM | POA: Insufficient documentation

## 2012-02-18 DIAGNOSIS — Z950 Presence of cardiac pacemaker: Secondary | ICD-10-CM | POA: Insufficient documentation

## 2012-02-18 DIAGNOSIS — I4891 Unspecified atrial fibrillation: Secondary | ICD-10-CM | POA: Insufficient documentation

## 2012-02-18 DIAGNOSIS — Z951 Presence of aortocoronary bypass graft: Secondary | ICD-10-CM | POA: Insufficient documentation

## 2012-02-18 DIAGNOSIS — F17200 Nicotine dependence, unspecified, uncomplicated: Secondary | ICD-10-CM | POA: Insufficient documentation

## 2012-02-21 ENCOUNTER — Encounter (HOSPITAL_COMMUNITY)
Admission: RE | Admit: 2012-02-21 | Discharge: 2012-02-21 | Disposition: A | Discharge status: Home / Self Care | Payer: Self-pay | Source: Ambulatory Visit | Attending: Cardiology | Admitting: Cardiology

## 2012-02-22 ENCOUNTER — Encounter: Payer: Self-pay | Admitting: Internal Medicine

## 2012-02-22 ENCOUNTER — Other Ambulatory Visit (INDEPENDENT_AMBULATORY_CARE_PROVIDER_SITE_OTHER): Payer: Medicare Other

## 2012-02-22 ENCOUNTER — Ambulatory Visit (INDEPENDENT_AMBULATORY_CARE_PROVIDER_SITE_OTHER): Payer: Medicare Other | Admitting: Internal Medicine

## 2012-02-22 DIAGNOSIS — M4800 Spinal stenosis, site unspecified: Secondary | ICD-10-CM | POA: Diagnosis not present

## 2012-02-22 DIAGNOSIS — I10 Essential (primary) hypertension: Secondary | ICD-10-CM

## 2012-02-22 DIAGNOSIS — J309 Allergic rhinitis, unspecified: Secondary | ICD-10-CM

## 2012-02-22 DIAGNOSIS — E785 Hyperlipidemia, unspecified: Secondary | ICD-10-CM | POA: Diagnosis not present

## 2012-02-22 DIAGNOSIS — N400 Enlarged prostate without lower urinary tract symptoms: Secondary | ICD-10-CM

## 2012-02-22 DIAGNOSIS — I4891 Unspecified atrial fibrillation: Secondary | ICD-10-CM

## 2012-02-22 DIAGNOSIS — E876 Hypokalemia: Secondary | ICD-10-CM

## 2012-02-22 LAB — LIPID PANEL
Cholesterol: 122 mg/dL (ref 0–200)
LDL Cholesterol: 58 mg/dL (ref 0–99)
Triglycerides: 94 mg/dL (ref 0.0–149.0)
VLDL: 18.8 mg/dL (ref 0.0–40.0)

## 2012-02-22 LAB — COMPREHENSIVE METABOLIC PANEL
AST: 23 U/L (ref 0–37)
Albumin: 3.7 g/dL (ref 3.5–5.2)
BUN: 14 mg/dL (ref 6–23)
Calcium: 8.8 mg/dL (ref 8.4–10.5)
Chloride: 107 mEq/L (ref 96–112)
Creatinine, Ser: 0.8 mg/dL (ref 0.4–1.5)
Glucose, Bld: 104 mg/dL — ABNORMAL HIGH (ref 70–99)
Potassium: 3.4 mEq/L — ABNORMAL LOW (ref 3.5–5.1)

## 2012-02-22 LAB — TSH: TSH: 0.9 u[IU]/mL (ref 0.35–5.50)

## 2012-02-22 NOTE — Progress Notes (Signed)
Subjective:    Patient ID: Dwayne [name], male    DOB: [birth year], 78 y.o.   MRN: [id number]  Hypertension This is a chronic problem. The current episode started more than 1 year ago. The problem has been gradually improving since onset. The problem is controlled. Pertinent negatives include no anxiety, blurred vision, chest pain, headaches, malaise/fatigue, neck pain, orthopnea, palpitations, peripheral edema, PND, shortness of breath or sweats. Past treatments include beta blockers and alpha 1 blockers. The current treatment provides moderate improvement. There are no compliance problems.  Hypertensive end-organ damage includes heart failure and left ventricular hypertrophy. There is no history of chronic renal disease.  Hyperlipidemia This is a chronic problem. The current episode started more than 1 year ago. The problem is controlled. Recent lipid tests were reviewed and are variable. He has no history of chronic renal disease, diabetes, hypothyroidism, liver disease or obesity. Pertinent negatives include no chest pain, focal sensory loss, focal weakness, leg pain, myalgias or shortness of breath. Current antihyperlipidemic treatment includes statins. The current treatment provides significant improvement of lipids. There are no compliance problems.       Review of Systems  Constitutional: Positive for fatigue. Negative for fever, chills, malaise/fatigue, diaphoresis, activity change, appetite change and unexpected weight change.  HENT: Positive for congestion, rhinorrhea and postnasal drip. Negative for hearing loss, ear pain, nosebleeds, sore throat, facial swelling, sneezing, drooling, mouth sores, trouble swallowing, neck pain, neck stiffness, dental problem, voice change, sinus pressure, tinnitus and ear discharge.   Eyes: Negative.  Negative for blurred vision.  Respiratory: Negative for cough, chest tightness, shortness of breath, wheezing and stridor.   Cardiovascular: Negative for  chest pain, palpitations, orthopnea, leg swelling and PND.  Gastrointestinal: Negative for nausea, vomiting, abdominal pain, diarrhea, blood in stool, abdominal distention and anal bleeding.  Genitourinary: Negative.   Musculoskeletal: Negative for myalgias, back pain, joint swelling, arthralgias and gait problem.  Skin: Negative for color change, pallor, rash and wound.  Neurological: Negative for dizziness, tremors, focal weakness, seizures, syncope, speech difficulty, weakness, light-headedness, numbness and headaches.  Hematological: Negative for adenopathy. Does not bruise/bleed easily.  Psychiatric/Behavioral: Negative.        Objective:   Physical Exam  Vitals reviewed. Constitutional: He is oriented to person, place, and time. He appears well-developed and well-nourished. No distress.  HENT:  Head: Normocephalic and atraumatic. No trismus in the jaw.  Right Ear: Hearing, tympanic membrane, external ear and ear canal normal.  Left Ear: Hearing, tympanic membrane, external ear and ear canal normal.  Nose: Mucosal edema present. No rhinorrhea, nose lacerations, sinus tenderness, nasal deformity, septal deviation or nasal septal hematoma. No epistaxis.  No foreign bodies. Right sinus exhibits no maxillary sinus tenderness and no frontal sinus tenderness. Left sinus exhibits no maxillary sinus tenderness and no frontal sinus tenderness.  Mouth/Throat: Oropharynx is clear and moist and mucous membranes are normal. Mucous membranes are not pale, not dry and not cyanotic. No uvula swelling. No oropharyngeal exudate, posterior oropharyngeal edema, posterior oropharyngeal erythema or tonsillar abscesses.  Eyes: Conjunctivae are normal. Right eye exhibits no discharge. Left eye exhibits no discharge. No scleral icterus.  Neck: Normal range of motion. Neck supple. No JVD present. No tracheal deviation present. No thyromegaly present.  Cardiovascular: Normal rate, regular rhythm, normal heart  sounds and intact distal pulses.  Exam reveals no gallop and no friction rub.   No murmur heard. Pulmonary/Chest: Effort normal and breath sounds normal. No stridor. No respiratory distress. He has no wheezes. He  has no rales. He exhibits no tenderness.  Abdominal: Soft. Bowel sounds are normal. He exhibits no distension and no mass. There is no tenderness. There is no rebound and no guarding.  Musculoskeletal: Normal range of motion. He exhibits no edema and no tenderness.  Lymphadenopathy:    He has no cervical adenopathy.  Neurological: He is oriented to person, place, and time.  Skin: Skin is warm and dry. No rash noted. He is not diaphoretic. No erythema. No pallor.  Psychiatric: He has a normal mood and affect. His behavior is normal. Judgment and thought content normal.     Lab Results  Component Value Date   WBC 7.2 07/20/2011   HGB 12.8* 07/20/2011   HCT 42.2 07/20/2011   PLT 246.0 07/20/2011   GLUCOSE 80 07/20/2011   CHOL 114 07/20/2011   TRIG 125.0 07/20/2011   HDL 43.40 07/20/2011   LDLCALC 46 07/20/2011   ALT 23 07/20/2011   AST 25 07/20/2011   NA 141 07/20/2011   K 4.3 07/20/2011   CL 108 07/20/2011   CREATININE 0.7 07/20/2011   BUN 10 07/20/2011   CO2 30 07/20/2011   TSH 1.07 03/03/2011   INR 2.7 02/07/2012       Assessment & Plan:

## 2012-02-22 NOTE — Assessment & Plan Note (Signed)
He has good rate and rhythm control 

## 2012-02-22 NOTE — Assessment & Plan Note (Signed)
PSA testing was denied by the software today

## 2012-02-22 NOTE — Patient Instructions (Signed)

## 2012-02-22 NOTE — Assessment & Plan Note (Signed)
His BP is well controlled, I will check his lytes and renal function 

## 2012-02-23 ENCOUNTER — Encounter (HOSPITAL_COMMUNITY): Payer: Self-pay

## 2012-02-23 ENCOUNTER — Encounter: Payer: Self-pay | Admitting: Internal Medicine

## 2012-02-23 DIAGNOSIS — E876 Hypokalemia: Secondary | ICD-10-CM | POA: Insufficient documentation

## 2012-02-23 MED ORDER — POTASSIUM CHLORIDE CRYS ER 20 MEQ PO TBCR: 20.0000 meq | EXTENDED_RELEASE_TABLET | Freq: Every day | ORAL | Status: DC

## 2012-02-23 NOTE — Assessment & Plan Note (Signed)
He is doing well on crestor 

## 2012-02-23 NOTE — Assessment & Plan Note (Signed)
He will continue using the "netti-pot", he has no s/s suggestive of infection today

## 2012-02-23 NOTE — Assessment & Plan Note (Signed)
Start K+ replacement 

## 2012-02-25 ENCOUNTER — Encounter (HOSPITAL_COMMUNITY)
Admission: RE | Admit: 2012-02-25 | Discharge: 2012-02-25 | Disposition: A | Discharge status: Home / Self Care | Payer: Self-pay | Source: Ambulatory Visit | Attending: Cardiology | Admitting: Cardiology

## 2012-02-28 ENCOUNTER — Encounter (HOSPITAL_COMMUNITY): Payer: Self-pay

## 2012-02-28 ENCOUNTER — Telehealth: Payer: Self-pay

## 2012-02-28 DIAGNOSIS — R739 Hyperglycemia, unspecified: Secondary | ICD-10-CM | POA: Insufficient documentation

## 2012-02-28 DIAGNOSIS — E876 Hypokalemia: Secondary | ICD-10-CM

## 2012-02-28 DIAGNOSIS — R7309 Other abnormal glucose: Secondary | ICD-10-CM

## 2012-02-28 NOTE — Telephone Encounter (Signed)
Labs were ordered

## 2012-02-28 NOTE — Telephone Encounter (Signed)
Patient wife notified.

## 2012-02-28 NOTE — Telephone Encounter (Signed)
Patient received lab result letter from 02/23/12 and was told to repeat around 03/14/12. Patient would like to know if he may have labs first then follow up. Thanks

## 2012-02-29 ENCOUNTER — Ambulatory Visit: Payer: Medicare Other | Admitting: Internal Medicine

## 2012-02-29 ENCOUNTER — Ambulatory Visit (INDEPENDENT_AMBULATORY_CARE_PROVIDER_SITE_OTHER): Payer: Medicare Other | Admitting: *Deleted

## 2012-02-29 DIAGNOSIS — D518 Other vitamin B12 deficiency anemias: Secondary | ICD-10-CM

## 2012-02-29 MED ORDER — CYANOCOBALAMIN 1000 MCG/ML IJ SOLN
1000.0000 ug | Freq: Once | INTRAMUSCULAR | Status: AC
  Administered 2012-02-29: 1000 ug via INTRAMUSCULAR

## 2012-03-01 ENCOUNTER — Encounter (HOSPITAL_COMMUNITY)
Admission: RE | Admit: 2012-03-01 | Discharge: 2012-03-01 | Disposition: A | Discharge status: Home / Self Care | Payer: Self-pay | Source: Ambulatory Visit | Attending: Cardiology | Admitting: Cardiology

## 2012-03-03 ENCOUNTER — Encounter (HOSPITAL_COMMUNITY)
Admission: RE | Admit: 2012-03-03 | Discharge: 2012-03-03 | Disposition: A | Discharge status: Home / Self Care | Payer: Self-pay | Source: Ambulatory Visit | Attending: Cardiology | Admitting: Cardiology

## 2012-03-06 ENCOUNTER — Encounter (HOSPITAL_COMMUNITY)
Admission: RE | Admit: 2012-03-06 | Discharge: 2012-03-06 | Disposition: A | Discharge status: Home / Self Care | Payer: Self-pay | Source: Ambulatory Visit | Attending: Cardiology | Admitting: Cardiology

## 2012-03-08 ENCOUNTER — Encounter (HOSPITAL_COMMUNITY)
Admission: RE | Admit: 2012-03-08 | Discharge: 2012-03-08 | Disposition: A | Discharge status: Home / Self Care | Payer: Self-pay | Source: Ambulatory Visit | Attending: Cardiology | Admitting: Cardiology

## 2012-03-10 ENCOUNTER — Encounter (HOSPITAL_COMMUNITY)
Admission: RE | Admit: 2012-03-10 | Discharge: 2012-03-10 | Disposition: A | Discharge status: Home / Self Care | Payer: Self-pay | Source: Ambulatory Visit | Attending: Cardiology | Admitting: Cardiology

## 2012-03-13 ENCOUNTER — Encounter (HOSPITAL_COMMUNITY)
Admission: RE | Admit: 2012-03-13 | Discharge: 2012-03-13 | Disposition: A | Discharge status: Home / Self Care | Payer: Self-pay | Source: Ambulatory Visit | Attending: Cardiology | Admitting: Cardiology

## 2012-03-14 ENCOUNTER — Other Ambulatory Visit (INDEPENDENT_AMBULATORY_CARE_PROVIDER_SITE_OTHER): Payer: Medicare Other

## 2012-03-14 DIAGNOSIS — R7309 Other abnormal glucose: Secondary | ICD-10-CM | POA: Diagnosis not present

## 2012-03-14 DIAGNOSIS — E876 Hypokalemia: Secondary | ICD-10-CM | POA: Diagnosis not present

## 2012-03-14 LAB — MAGNESIUM: Magnesium: 2 mg/dL (ref 1.5–2.5)

## 2012-03-14 LAB — HEMOGLOBIN A1C: Hgb A1c MFr Bld: 6.2 % (ref 4.6–6.5)

## 2012-03-14 LAB — BASIC METABOLIC PANEL
Calcium: 9.1 mg/dL (ref 8.4–10.5)
GFR: 83.54 mL/min (ref >60.00)
Potassium: 4.1 mEq/L (ref 3.5–5.1)
Sodium: 139 mEq/L (ref 135–145)

## 2012-03-15 ENCOUNTER — Encounter (HOSPITAL_COMMUNITY)
Admission: RE | Admit: 2012-03-15 | Discharge: 2012-03-15 | Disposition: A | Discharge status: Home / Self Care | Payer: Self-pay | Source: Ambulatory Visit | Attending: Cardiology | Admitting: Cardiology

## 2012-03-17 ENCOUNTER — Encounter (HOSPITAL_COMMUNITY): Payer: Self-pay

## 2012-03-20 ENCOUNTER — Encounter (HOSPITAL_COMMUNITY): Payer: Self-pay

## 2012-03-20 DIAGNOSIS — Z9861 Coronary angioplasty status: Secondary | ICD-10-CM | POA: Insufficient documentation

## 2012-03-20 DIAGNOSIS — Z950 Presence of cardiac pacemaker: Secondary | ICD-10-CM | POA: Insufficient documentation

## 2012-03-20 DIAGNOSIS — I4891 Unspecified atrial fibrillation: Secondary | ICD-10-CM | POA: Insufficient documentation

## 2012-03-20 DIAGNOSIS — E7800 Pure hypercholesterolemia, unspecified: Secondary | ICD-10-CM | POA: Insufficient documentation

## 2012-03-20 DIAGNOSIS — I10 Essential (primary) hypertension: Secondary | ICD-10-CM | POA: Insufficient documentation

## 2012-03-20 DIAGNOSIS — Z951 Presence of aortocoronary bypass graft: Secondary | ICD-10-CM | POA: Insufficient documentation

## 2012-03-20 DIAGNOSIS — I2510 Atherosclerotic heart disease of native coronary artery without angina pectoris: Secondary | ICD-10-CM | POA: Insufficient documentation

## 2012-03-20 DIAGNOSIS — I252 Old myocardial infarction: Secondary | ICD-10-CM | POA: Insufficient documentation

## 2012-03-20 DIAGNOSIS — Z5189 Encounter for other specified aftercare: Secondary | ICD-10-CM | POA: Insufficient documentation

## 2012-03-20 DIAGNOSIS — F17200 Nicotine dependence, unspecified, uncomplicated: Secondary | ICD-10-CM | POA: Insufficient documentation

## 2012-03-21 ENCOUNTER — Ambulatory Visit (INDEPENDENT_AMBULATORY_CARE_PROVIDER_SITE_OTHER): Payer: Medicare Other

## 2012-03-21 DIAGNOSIS — D518 Other vitamin B12 deficiency anemias: Secondary | ICD-10-CM

## 2012-03-22 ENCOUNTER — Encounter (HOSPITAL_COMMUNITY)
Admission: RE | Admit: 2012-03-22 | Discharge: 2012-03-22 | Disposition: A | Discharge status: Home / Self Care | Payer: Self-pay | Source: Ambulatory Visit | Attending: Cardiology | Admitting: Cardiology

## 2012-03-24 ENCOUNTER — Encounter (HOSPITAL_COMMUNITY): Payer: Self-pay

## 2012-03-27 ENCOUNTER — Encounter (HOSPITAL_COMMUNITY)
Admission: RE | Admit: 2012-03-27 | Discharge: 2012-03-27 | Disposition: A | Discharge status: Home / Self Care | Payer: Self-pay | Source: Ambulatory Visit | Attending: Cardiology | Admitting: Cardiology

## 2012-03-29 ENCOUNTER — Encounter (HOSPITAL_COMMUNITY)
Admission: RE | Admit: 2012-03-29 | Discharge: 2012-03-29 | Disposition: A | Discharge status: Home / Self Care | Payer: Self-pay | Source: Ambulatory Visit | Attending: Cardiology | Admitting: Cardiology

## 2012-03-31 ENCOUNTER — Encounter (HOSPITAL_COMMUNITY)
Admission: RE | Admit: 2012-03-31 | Discharge: 2012-03-31 | Disposition: A | Discharge status: Home / Self Care | Payer: Self-pay | Source: Ambulatory Visit | Attending: Cardiology | Admitting: Cardiology

## 2012-04-03 ENCOUNTER — Encounter (HOSPITAL_COMMUNITY)
Admission: RE | Admit: 2012-04-03 | Discharge: 2012-04-03 | Disposition: A | Discharge status: Home / Self Care | Payer: Self-pay | Source: Ambulatory Visit | Attending: Cardiology | Admitting: Cardiology

## 2012-04-05 ENCOUNTER — Encounter (HOSPITAL_COMMUNITY)
Admission: RE | Admit: 2012-04-05 | Discharge: 2012-04-05 | Disposition: A | Discharge status: Home / Self Care | Payer: Self-pay | Source: Ambulatory Visit | Attending: Cardiology | Admitting: Cardiology

## 2012-04-07 ENCOUNTER — Encounter (HOSPITAL_COMMUNITY)
Admission: RE | Admit: 2012-04-07 | Discharge: 2012-04-07 | Disposition: A | Discharge status: Home / Self Care | Payer: Self-pay | Source: Ambulatory Visit | Attending: Cardiology | Admitting: Cardiology

## 2012-04-10 ENCOUNTER — Encounter (HOSPITAL_COMMUNITY)
Admission: RE | Admit: 2012-04-10 | Discharge: 2012-04-10 | Disposition: A | Discharge status: Home / Self Care | Payer: Self-pay | Source: Ambulatory Visit | Attending: Cardiology | Admitting: Cardiology

## 2012-04-11 ENCOUNTER — Ambulatory Visit: Payer: Medicare Other

## 2012-04-12 ENCOUNTER — Ambulatory Visit (INDEPENDENT_AMBULATORY_CARE_PROVIDER_SITE_OTHER): Payer: Medicare Other

## 2012-04-12 ENCOUNTER — Encounter (HOSPITAL_COMMUNITY)
Admission: RE | Admit: 2012-04-12 | Discharge: 2012-04-12 | Disposition: A | Discharge status: Home / Self Care | Payer: Self-pay | Source: Ambulatory Visit | Attending: Cardiology | Admitting: Cardiology

## 2012-04-12 DIAGNOSIS — D518 Other vitamin B12 deficiency anemias: Secondary | ICD-10-CM

## 2012-04-12 MED ORDER — CYANOCOBALAMIN 1000 MCG/ML IJ SOLN
1000.0000 ug | Freq: Once | INTRAMUSCULAR | Status: AC
  Administered 2012-04-12: 1000 ug via INTRAMUSCULAR

## 2012-04-14 ENCOUNTER — Encounter (HOSPITAL_COMMUNITY)
Admission: RE | Admit: 2012-04-14 | Discharge: 2012-04-14 | Disposition: A | Discharge status: Home / Self Care | Payer: Self-pay | Source: Ambulatory Visit | Attending: Cardiology | Admitting: Cardiology

## 2012-04-17 ENCOUNTER — Encounter (HOSPITAL_COMMUNITY)
Admission: RE | Admit: 2012-04-17 | Discharge: 2012-04-17 | Disposition: A | Discharge status: Home / Self Care | Payer: Self-pay | Source: Ambulatory Visit | Attending: Cardiology | Admitting: Cardiology

## 2012-04-19 ENCOUNTER — Encounter (HOSPITAL_COMMUNITY)
Admission: RE | Admit: 2012-04-19 | Discharge: 2012-04-19 | Disposition: A | Discharge status: Home / Self Care | Payer: Self-pay | Source: Ambulatory Visit | Attending: Cardiology | Admitting: Cardiology

## 2012-04-19 DIAGNOSIS — I4891 Unspecified atrial fibrillation: Secondary | ICD-10-CM | POA: Insufficient documentation

## 2012-04-19 DIAGNOSIS — F17200 Nicotine dependence, unspecified, uncomplicated: Secondary | ICD-10-CM | POA: Insufficient documentation

## 2012-04-19 DIAGNOSIS — Z5189 Encounter for other specified aftercare: Secondary | ICD-10-CM | POA: Insufficient documentation

## 2012-04-19 DIAGNOSIS — Z950 Presence of cardiac pacemaker: Secondary | ICD-10-CM | POA: Insufficient documentation

## 2012-04-19 DIAGNOSIS — I252 Old myocardial infarction: Secondary | ICD-10-CM | POA: Insufficient documentation

## 2012-04-19 DIAGNOSIS — E7800 Pure hypercholesterolemia, unspecified: Secondary | ICD-10-CM | POA: Insufficient documentation

## 2012-04-19 DIAGNOSIS — I10 Essential (primary) hypertension: Secondary | ICD-10-CM | POA: Insufficient documentation

## 2012-04-19 DIAGNOSIS — Z9861 Coronary angioplasty status: Secondary | ICD-10-CM | POA: Insufficient documentation

## 2012-04-19 DIAGNOSIS — Z951 Presence of aortocoronary bypass graft: Secondary | ICD-10-CM | POA: Insufficient documentation

## 2012-04-19 DIAGNOSIS — I2510 Atherosclerotic heart disease of native coronary artery without angina pectoris: Secondary | ICD-10-CM | POA: Insufficient documentation

## 2012-04-21 ENCOUNTER — Encounter (HOSPITAL_COMMUNITY)
Admission: RE | Admit: 2012-04-21 | Discharge: 2012-04-21 | Disposition: A | Discharge status: Home / Self Care | Payer: Self-pay | Source: Ambulatory Visit | Attending: Cardiology | Admitting: Cardiology

## 2012-04-24 ENCOUNTER — Encounter (HOSPITAL_COMMUNITY)
Admission: RE | Admit: 2012-04-24 | Discharge: 2012-04-24 | Disposition: A | Discharge status: Home / Self Care | Payer: Self-pay | Source: Ambulatory Visit | Attending: Cardiology | Admitting: Cardiology

## 2012-04-26 ENCOUNTER — Encounter (HOSPITAL_COMMUNITY)
Admission: RE | Admit: 2012-04-26 | Discharge: 2012-04-26 | Disposition: A | Discharge status: Home / Self Care | Payer: Self-pay | Source: Ambulatory Visit | Attending: Cardiology | Admitting: Cardiology

## 2012-04-28 ENCOUNTER — Ambulatory Visit (INDEPENDENT_AMBULATORY_CARE_PROVIDER_SITE_OTHER): Payer: Medicare Other | Admitting: Family Medicine

## 2012-04-28 ENCOUNTER — Encounter (HOSPITAL_COMMUNITY): Payer: Self-pay

## 2012-04-28 VITALS — BP 149/84 | HR 83 | Temp 97.8°F | Resp 16

## 2012-04-28 VITALS — BP 136/79 | HR 69 | Temp 97.8°F | Resp 16 | Ht 68.0 in | Wt 170.0 lb

## 2012-04-28 DIAGNOSIS — K1379 Other lesions of oral mucosa: Secondary | ICD-10-CM

## 2012-04-28 DIAGNOSIS — K1370 Unspecified lesions of oral mucosa: Secondary | ICD-10-CM

## 2012-04-28 DIAGNOSIS — Z7901 Long term (current) use of anticoagulants: Secondary | ICD-10-CM

## 2012-04-28 DIAGNOSIS — Z5181 Encounter for therapeutic drug level monitoring: Secondary | ICD-10-CM | POA: Diagnosis not present

## 2012-04-28 LAB — POCT CBC
Granulocyte percent: 66.6 %G (ref 37–80)
HCT, POC: 41.6 % — AB (ref 43.5–53.7)
Hemoglobin: 12.9 g/dL — AB (ref 14.1–18.1)
Lymph, poc: 2.5 (ref 0.6–3.4)
MCHC: 31 g/dL — AB (ref 31.8–35.4)
MCV: 91 fL (ref 80–97)
POC Granulocyte: 6.7 (ref 2–6.9)

## 2012-04-28 LAB — PROTIME-INR
INR: 2.78 — ABNORMAL HIGH (ref <1.50)
Prothrombin Time: 30.2 seconds — ABNORMAL HIGH (ref 11.6–15.2)

## 2012-04-28 NOTE — Progress Notes (Signed)
Patient Name: Dwayne [name] Date of Birth: Apr 14, 1932 Medical Record Number: [id number] Gender: male Date of Encounter: 04/28/2012  History of Present Illness:  Dwayne [name] is a 78 y.o. very pleasant male patient who presents with the following:  Bit his tongue last night while he was eating- unsure if his bite alignment might be a little different due to recent dental work. He had a cap placed on a right sided molar.  His tongue bled "all night" last night so he felt he should come in for an evaluation.  This morning he he spit out some blood, but it seems that his bleeding is finally resolved.  He has no nausea, vomiting, no blood in stool or melena.  He has no other usnusual bleeding or bruising.   Daisy takes coumadin for a history of MI and bypass.    Japheth called his coumadin clinic today and they advised him to be seen by [place] this morning- his coumadin clinic is at [facility].    Patient Active Problem List  Diagnoses  . HYPERLIPIDEMIA  . CAD, ARTERY BYPASS GRAFT  . Atrial fibrillation  . BRONCHIECTASIS  . SPINAL STENOSIS  . Cardiac pacemaker in situ  . Long term current use of anticoagulant  . ANEMIA, B12 DEFICIENCY  . Essential hypertension, benign  . BPH (benign prostatic hyperplasia)  . Hypokalemia  . Other abnormal glucose   Past Medical History  Diagnosis Date  . Arrhythmia   . Hyperlipidemia   . Bronchiectasis without acute exacerbation   . Disorders of diaphragm   . Plantar fascial fibromatosis   . Epistaxis   . Unspecified sinusitis (chronic)   . Allergic rhinitis, cause unspecified   . Spinal stenosis, unspecified region other than cervical    Past Surgical History  Procedure Date  . Coronary artery bypass graft   . Insert / replace / remove pacemaker     2007  . Kidney cyst removal   . Doppler echocardiography 2011   History  Substance Use Topics  . Smoking status: Never Smoker   . Smokeless tobacco: Not on file  . Alcohol Use: No   No family  history on file. Allergies  Allergen Reactions  . Clindamycin     REACTION: upset stomach  . Doxycycline Hyclate     REACTION: n/v/d  . Erythromycin     REACTION: n/v/d  . Metronidazole     REACTION: rash, itching  . Penicillins     REACTION: hives  . Sulfonamide Derivatives     REACTION: hives  . Tetracycline     REACTION: n/v/d    Medication list has been reviewed and updated.  Review of Systems: As per HPI- otherwise negative. He has not noted any other unusual bleeding or bruising  Physical Examination: Filed Vitals:   04/28/12 1019  BP: 149/84  Pulse: 83  Temp: 97.8 F (36.6 C)  TempSrc: Oral  Resp: 16    There is no height or weight on file to calculate BMI.  GEN: WDWN, NAD, Non-toxic, A & O x 3 HEENT: Atraumatic, Normocephalic. Neck supple. No masses, No LAD.  Tm wnl.   Oral exam reveals a small bite wound on the bottom right part of the tongue- it is not bleeding and does not appear to be deep Ears and Nose: No external deformity. CV: RRR, No M/G/R. No JVD. No thrill. No extra heart sounds. PULM: CTA B, no wheezes, crackles, rhonchi. No retractions. No resp. distress. No accessory muscle use. EXTR: No  c/c/e NEURO Normal gait.  PSYCH: Normally interactive. Conversant. Not depressed or anxious appearing.  Calm demeanor.   Results for orders placed in visit on 04/28/12  POCT CBC      Component Value Range   WBC 10.0  4.6 - 10.2 (K/uL)   Lymph, poc 2.5  0.6 - 3.4    POC LYMPH PERCENT 25.1  10 - 50 (%L)   MID (cbc) 0.8  0 - 0.9    POC MID % 8.3  0 - 12 (%M)   POC Granulocyte 6.7  2 - 6.9    Granulocyte percent 66.6  37 - 80 (%G)   RBC 4.57 (*) 4.69 - 6.13 (M/uL)   Hemoglobin 12.9 (*) 14.1 - 18.1 (g/dL)   HCT, POC 16.1 (*) 09.6 - 53.7 (%)   MCV 91.0  80 - 97 (fL)   MCH, POC 28.2  27 - 31.2 (pg)   MCHC 31.0 (*) 31.8 - 35.4 (g/dL)   RDW, POC 04.5     Platelet Count, POC 257  142 - 424 (K/uL)   MPV 9.7  0 - 99.8 (fL)    Assessment and Plan: 1.  Anticoagulated on Coumadin  POCT CBC, Protime-INR  2. Bleeding from mouth  POCT CBC, Protime-INR   Oral bleeding currently resolved- RTC if it comes back.   See note from later on same date- bleeding did come back

## 2012-04-28 NOTE — Progress Notes (Signed)
See note from earlier today. Moe did well all day, but then his oral wound started to bleed again at about 5pm.  He returned to clinic. The oral wound apparent earlier today is now bleeding again- fairly briskly. Applied pressure and silver nitrogen which helped but did not resolve bleeding.  Then tried soaking gauze (wrapped tightly to mimic a dental roll) with afrin nasal spray and applying pressure to wound with the gauze.  After about 3 rounds of this the bleeding was minimal to none.  Edker spent about an hour to 90 minutes at clinic and did achieve minimal to no bleeding.  However, I am concerned that he will re- bleed tonight.  Offered to close the bite with sutures, but he declined this for now.  If he has any more than minimal bleeding tonight he will proceed to the ED.  Otherwise if he has any problems with continued bleeding he plans to RTC tomorrow for suturing.  Urged him to rest, avoid talking and drink ensure with a straw on the opposite side of his mouth tonight for dinner.  He feels comfortable with the plan and agrees to go to the ED if he has significant bleeding tonight.    He is in no distress, is in good spirits and pleased that his bleeding is much better.    [name] Kitchen Results for orders placed in visit on 04/28/12  POCT CBC      Component Value Range   WBC 10.0  4.6 - 10.2 (K/uL)   Lymph, poc 2.5  0.6 - 3.4    POC LYMPH PERCENT 25.1  10 - 50 (%L)   MID (cbc) 0.8  0 - 0.9    POC MID % 8.3  0 - 12 (%M)   POC Granulocyte 6.7  2 - 6.9    Granulocyte percent 66.6  37 - 80 (%G)   RBC 4.57 (*) 4.69 - 6.13 (M/uL)   Hemoglobin 12.9 (*) 14.1 - 18.1 (g/dL)   HCT, POC 08.6 (*) 57.8 - 53.7 (%)   MCV 91.0  80 - 97 (fL)   MCH, POC 28.2  27 - 31.2 (pg)   MCHC 31.0 (*) 31.8 - 35.4 (g/dL)   RDW, POC 46.9     Platelet Count, POC 257  142 - 424 (K/uL)   MPV 9.7  0 - 99.8 (fL)  PROTIME-INR      Component Value Range   Prothrombin Time 30.2 (*) 11.6 - 15.2 (seconds)   INR 2.78 (*) <1.50      Hemoglobin is at his baseline from a year ago, and INR is appropriate.

## 2012-05-01 ENCOUNTER — Encounter (HOSPITAL_COMMUNITY): Payer: Self-pay

## 2012-05-03 ENCOUNTER — Ambulatory Visit (INDEPENDENT_AMBULATORY_CARE_PROVIDER_SITE_OTHER): Payer: Medicare Other | Admitting: *Deleted

## 2012-05-03 ENCOUNTER — Encounter (HOSPITAL_COMMUNITY)
Admission: RE | Admit: 2012-05-03 | Discharge: 2012-05-03 | Disposition: A | Discharge status: Home / Self Care | Payer: Self-pay | Source: Ambulatory Visit | Attending: Cardiology | Admitting: Cardiology

## 2012-05-03 DIAGNOSIS — D518 Other vitamin B12 deficiency anemias: Secondary | ICD-10-CM | POA: Diagnosis not present

## 2012-05-03 MED ORDER — CYANOCOBALAMIN 1000 MCG/ML IJ SOLN
1000.0000 ug | Freq: Once | INTRAMUSCULAR | Status: AC
  Administered 2012-05-03: 1000 ug via INTRAMUSCULAR

## 2012-05-04 ENCOUNTER — Encounter: Payer: Medicare Other | Admitting: *Deleted

## 2012-05-04 ENCOUNTER — Ambulatory Visit (INDEPENDENT_AMBULATORY_CARE_PROVIDER_SITE_OTHER): Payer: Medicare Other

## 2012-05-04 DIAGNOSIS — I4891 Unspecified atrial fibrillation: Secondary | ICD-10-CM | POA: Diagnosis not present

## 2012-05-04 DIAGNOSIS — Z7901 Long term (current) use of anticoagulants: Secondary | ICD-10-CM | POA: Diagnosis not present

## 2012-05-05 ENCOUNTER — Encounter (HOSPITAL_COMMUNITY): Payer: Self-pay

## 2012-05-08 ENCOUNTER — Encounter: Payer: Self-pay | Admitting: Internal Medicine

## 2012-05-08 ENCOUNTER — Encounter (HOSPITAL_COMMUNITY): Payer: Self-pay

## 2012-05-10 ENCOUNTER — Encounter (HOSPITAL_COMMUNITY): Payer: Self-pay

## 2012-05-11 ENCOUNTER — Ambulatory Visit (INDEPENDENT_AMBULATORY_CARE_PROVIDER_SITE_OTHER): Payer: Medicare Other | Admitting: *Deleted

## 2012-05-11 DIAGNOSIS — I4891 Unspecified atrial fibrillation: Secondary | ICD-10-CM

## 2012-05-11 DIAGNOSIS — Z7901 Long term (current) use of anticoagulants: Secondary | ICD-10-CM | POA: Diagnosis not present

## 2012-05-11 DIAGNOSIS — Z950 Presence of cardiac pacemaker: Secondary | ICD-10-CM

## 2012-05-11 LAB — POCT INR: INR: 1.4

## 2012-05-12 ENCOUNTER — Encounter (HOSPITAL_COMMUNITY)
Admission: RE | Admit: 2012-05-12 | Discharge: 2012-05-12 | Disposition: A | Discharge status: Home / Self Care | Payer: Self-pay | Source: Ambulatory Visit | Attending: Cardiology | Admitting: Cardiology

## 2012-05-12 ENCOUNTER — Encounter: Payer: Self-pay | Admitting: *Deleted

## 2012-05-12 LAB — REMOTE PACEMAKER DEVICE
AL AMPLITUDE: 2.8 mv
AL IMPEDENCE PM: 542 Ohm
BAMS-0001: 175 {beats}/min
BATTERY VOLTAGE: 2.73 V
RV LEAD THRESHOLD: 0.625 V

## 2012-05-15 ENCOUNTER — Encounter (HOSPITAL_COMMUNITY): Payer: Self-pay

## 2012-05-17 ENCOUNTER — Encounter (HOSPITAL_COMMUNITY)
Admission: RE | Admit: 2012-05-17 | Discharge: 2012-05-17 | Disposition: A | Discharge status: Home / Self Care | Payer: Self-pay | Source: Ambulatory Visit | Attending: Cardiology | Admitting: Cardiology

## 2012-05-18 ENCOUNTER — Other Ambulatory Visit: Payer: Self-pay

## 2012-05-18 ENCOUNTER — Telehealth: Payer: Self-pay | Admitting: Cardiology

## 2012-05-18 ENCOUNTER — Ambulatory Visit (INDEPENDENT_AMBULATORY_CARE_PROVIDER_SITE_OTHER): Payer: Medicare Other

## 2012-05-18 DIAGNOSIS — Z7901 Long term (current) use of anticoagulants: Secondary | ICD-10-CM | POA: Diagnosis not present

## 2012-05-18 DIAGNOSIS — I4891 Unspecified atrial fibrillation: Secondary | ICD-10-CM

## 2012-05-18 LAB — POCT INR: INR: 2.2

## 2012-05-18 MED ORDER — TERAZOSIN HCL 5 MG PO CAPS: 5.0000 mg | ORAL_CAPSULE | Freq: Every day | ORAL | Status: DC

## 2012-05-18 NOTE — Telephone Encounter (Signed)
New Problem:    Patient called in wanting to know if his last transmission was received on 05/08/12.  Please call back.

## 2012-05-18 NOTE — Telephone Encounter (Signed)
Transmission received, patient aware. 

## 2012-05-19 ENCOUNTER — Encounter (HOSPITAL_COMMUNITY)
Admission: RE | Admit: 2012-05-19 | Discharge: 2012-05-19 | Disposition: A | Discharge status: Home / Self Care | Payer: Self-pay | Source: Ambulatory Visit | Attending: Cardiology | Admitting: Cardiology

## 2012-05-22 ENCOUNTER — Encounter (HOSPITAL_COMMUNITY)
Admission: RE | Admit: 2012-05-22 | Discharge: 2012-05-22 | Disposition: A | Discharge status: Home / Self Care | Payer: Self-pay | Source: Ambulatory Visit | Attending: Cardiology | Admitting: Cardiology

## 2012-05-22 ENCOUNTER — Encounter: Payer: Self-pay | Admitting: *Deleted

## 2012-05-22 DIAGNOSIS — F17200 Nicotine dependence, unspecified, uncomplicated: Secondary | ICD-10-CM | POA: Insufficient documentation

## 2012-05-22 DIAGNOSIS — Z951 Presence of aortocoronary bypass graft: Secondary | ICD-10-CM | POA: Insufficient documentation

## 2012-05-22 DIAGNOSIS — I10 Essential (primary) hypertension: Secondary | ICD-10-CM | POA: Insufficient documentation

## 2012-05-22 DIAGNOSIS — E7800 Pure hypercholesterolemia, unspecified: Secondary | ICD-10-CM | POA: Insufficient documentation

## 2012-05-22 DIAGNOSIS — Z5189 Encounter for other specified aftercare: Secondary | ICD-10-CM | POA: Insufficient documentation

## 2012-05-22 DIAGNOSIS — Z950 Presence of cardiac pacemaker: Secondary | ICD-10-CM | POA: Insufficient documentation

## 2012-05-22 DIAGNOSIS — I252 Old myocardial infarction: Secondary | ICD-10-CM | POA: Insufficient documentation

## 2012-05-22 DIAGNOSIS — I2510 Atherosclerotic heart disease of native coronary artery without angina pectoris: Secondary | ICD-10-CM | POA: Insufficient documentation

## 2012-05-22 DIAGNOSIS — I4891 Unspecified atrial fibrillation: Secondary | ICD-10-CM | POA: Insufficient documentation

## 2012-05-22 DIAGNOSIS — Z9861 Coronary angioplasty status: Secondary | ICD-10-CM | POA: Insufficient documentation

## 2012-05-24 ENCOUNTER — Encounter (HOSPITAL_COMMUNITY)
Admission: RE | Admit: 2012-05-24 | Discharge: 2012-05-24 | Disposition: A | Discharge status: Home / Self Care | Payer: Self-pay | Source: Ambulatory Visit | Attending: Cardiology | Admitting: Cardiology

## 2012-05-25 ENCOUNTER — Encounter: Payer: Self-pay | Admitting: Internal Medicine

## 2012-05-25 ENCOUNTER — Ambulatory Visit (INDEPENDENT_AMBULATORY_CARE_PROVIDER_SITE_OTHER): Payer: Medicare Other | Admitting: Internal Medicine

## 2012-05-25 VITALS — BP 130/84 | HR 86 | Temp 98.5°F | Resp 16 | Wt 164.0 lb

## 2012-05-25 DIAGNOSIS — R7309 Other abnormal glucose: Secondary | ICD-10-CM

## 2012-05-25 DIAGNOSIS — D518 Other vitamin B12 deficiency anemias: Secondary | ICD-10-CM | POA: Diagnosis not present

## 2012-05-25 DIAGNOSIS — I4891 Unspecified atrial fibrillation: Secondary | ICD-10-CM

## 2012-05-25 DIAGNOSIS — K5900 Constipation, unspecified: Secondary | ICD-10-CM

## 2012-05-25 DIAGNOSIS — I10 Essential (primary) hypertension: Secondary | ICD-10-CM | POA: Diagnosis not present

## 2012-05-25 DIAGNOSIS — Z23 Encounter for immunization: Secondary | ICD-10-CM | POA: Diagnosis not present

## 2012-05-25 DIAGNOSIS — K5909 Other constipation: Secondary | ICD-10-CM | POA: Insufficient documentation

## 2012-05-25 DIAGNOSIS — Z2911 Encounter for prophylactic immunotherapy for respiratory syncytial virus (RSV): Secondary | ICD-10-CM | POA: Diagnosis not present

## 2012-05-25 DIAGNOSIS — E785 Hyperlipidemia, unspecified: Secondary | ICD-10-CM

## 2012-05-25 MED ORDER — CYANOCOBALAMIN 1000 MCG/ML IJ SOLN
1000.0000 ug | Freq: Once | INTRAMUSCULAR | Status: AC
  Administered 2012-05-25: 1000 ug via INTRAMUSCULAR

## 2012-05-25 MED ORDER — LINACLOTIDE 145 MCG PO CAPS: 1.0000 | ORAL_CAPSULE | Freq: Every day | ORAL | Status: DC

## 2012-05-25 NOTE — Progress Notes (Signed)
Subjective:    Patient ID: Dwayne [name], male    DOB: [birth year], 78 y.o.   MRN: [id number]  Constipation This is a chronic problem. The current episode started more than 1 year ago. The problem is unchanged. His stool frequency is 2 to 3 times per week. The stool is described as firm and formed. The patient is on a high fiber diet. He does not exercise regularly. There has been adequate water intake. Pertinent negatives include no abdominal pain, anorexia, back pain, bloating, diarrhea, difficulty urinating, fecal incontinence, fever, flatus, hematochezia, hemorrhoids, melena, nausea, rectal pain, vomiting or weight loss. Risk factors include immobility. He has tried fiber, enemas, stool softeners and laxatives for the symptoms. The treatment provided mild relief.      Review of Systems  Constitutional: Negative for fever, chills, weight loss, diaphoresis, activity change, appetite change, fatigue and unexpected weight change.  HENT: Negative.   Eyes: Negative.   Respiratory: Negative for cough, chest tightness, shortness of breath, wheezing and stridor.   Cardiovascular: Negative for chest pain, palpitations and leg swelling.  Gastrointestinal: Positive for constipation. Negative for nausea, vomiting, abdominal pain, diarrhea, blood in stool, melena, hematochezia, abdominal distention, anal bleeding, rectal pain, bloating, anorexia, flatus and hemorrhoids.  Genitourinary: Negative.  Negative for difficulty urinating.  Musculoskeletal: Negative for myalgias, back pain, joint swelling, arthralgias and gait problem.  Skin: Negative for color change, pallor, rash and wound.  Neurological: Negative.   Hematological: Negative for adenopathy. Does not bruise/bleed easily.  Psychiatric/Behavioral: Negative.        Objective:   Physical Exam  Vitals reviewed. Constitutional: He is oriented to person, place, and time. He appears well-developed and well-nourished. No distress.  HENT:  Head:  Normocephalic and atraumatic.  Mouth/Throat: Oropharynx is clear and moist. No oropharyngeal exudate.  Eyes: Conjunctivae are normal. Right eye exhibits no discharge. Left eye exhibits no discharge. No scleral icterus.  Neck: Normal range of motion. Neck supple. No JVD present. No tracheal deviation present. No thyromegaly present.  Cardiovascular: Normal rate, regular rhythm, S1 normal, S2 normal, normal heart sounds and intact distal pulses.  Exam reveals no gallop and no friction rub.   No murmur heard. Pulmonary/Chest: Effort normal and breath sounds normal. No stridor. No respiratory distress. He has no wheezes. He has no rales. He exhibits no tenderness.  Abdominal: Soft. Bowel sounds are normal. He exhibits no distension and no mass. There is no tenderness. There is no rebound and no guarding.  Musculoskeletal: Normal range of motion. He exhibits no edema and no tenderness.  Lymphadenopathy:    He has no cervical adenopathy.  Neurological: He is oriented to person, place, and time.  Skin: Skin is warm and dry. No rash noted. He is not diaphoretic. No erythema. No pallor.  Psychiatric: He has a normal mood and affect. His behavior is normal. Judgment and thought content normal.      Lab Results  Component Value Date   WBC 10.0 04/28/2012   HGB 12.9* 04/28/2012   HCT 41.6* 04/28/2012   PLT 246.0 07/20/2011   GLUCOSE 104* 03/14/2012   CHOL 122 02/22/2012   TRIG 94.0 02/22/2012   HDL 45.00 02/22/2012   LDLCALC 58 02/22/2012   ALT 20 02/22/2012   AST 23 02/22/2012   NA 139 03/14/2012   K 4.1 03/14/2012   CL 106 03/14/2012   CREATININE 0.9 03/14/2012   BUN 16 03/14/2012   CO2 27 03/14/2012   TSH 0.90 02/22/2012   INR 2.2 05/18/2012  HGBA1C 6.2 03/14/2012      Assessment & Plan:

## 2012-05-25 NOTE — Patient Instructions (Signed)

## 2012-05-26 ENCOUNTER — Encounter: Payer: Self-pay | Admitting: Internal Medicine

## 2012-05-26 ENCOUNTER — Encounter (HOSPITAL_COMMUNITY)
Admission: RE | Admit: 2012-05-26 | Discharge: 2012-05-26 | Disposition: A | Discharge status: Home / Self Care | Payer: Self-pay | Source: Ambulatory Visit | Attending: Cardiology | Admitting: Cardiology

## 2012-05-26 NOTE — Assessment & Plan Note (Signed)
He has pre-diabetes 

## 2012-05-26 NOTE — Assessment & Plan Note (Signed)
He has good rate and rhythm control 

## 2012-05-26 NOTE — Assessment & Plan Note (Signed)
He will try linzess to see if that will help, I don't see any medical conditions that are causing this

## 2012-05-26 NOTE — Assessment & Plan Note (Signed)
His BP is well controlled 

## 2012-05-26 NOTE — Assessment & Plan Note (Signed)
His LDL is excellent, he is doing well on crestor

## 2012-05-29 ENCOUNTER — Encounter (HOSPITAL_COMMUNITY)
Admission: RE | Admit: 2012-05-29 | Discharge: 2012-05-29 | Disposition: A | Discharge status: Home / Self Care | Payer: Self-pay | Source: Ambulatory Visit | Attending: Cardiology | Admitting: Cardiology

## 2012-05-31 ENCOUNTER — Encounter (HOSPITAL_COMMUNITY)
Admission: RE | Admit: 2012-05-31 | Discharge: 2012-05-31 | Disposition: A | Discharge status: Home / Self Care | Payer: Self-pay | Source: Ambulatory Visit | Attending: Cardiology | Admitting: Cardiology

## 2012-06-01 ENCOUNTER — Ambulatory Visit (INDEPENDENT_AMBULATORY_CARE_PROVIDER_SITE_OTHER): Payer: Medicare Other | Admitting: *Deleted

## 2012-06-01 DIAGNOSIS — I4891 Unspecified atrial fibrillation: Secondary | ICD-10-CM

## 2012-06-01 DIAGNOSIS — Z7901 Long term (current) use of anticoagulants: Secondary | ICD-10-CM

## 2012-06-02 ENCOUNTER — Encounter (HOSPITAL_COMMUNITY)
Admission: RE | Admit: 2012-06-02 | Discharge: 2012-06-02 | Disposition: A | Discharge status: Home / Self Care | Payer: Self-pay | Source: Ambulatory Visit | Attending: Cardiology | Admitting: Cardiology

## 2012-06-05 ENCOUNTER — Encounter (HOSPITAL_COMMUNITY)
Admission: RE | Admit: 2012-06-05 | Discharge: 2012-06-05 | Disposition: A | Discharge status: Home / Self Care | Payer: Self-pay | Source: Ambulatory Visit | Attending: Cardiology | Admitting: Cardiology

## 2012-06-07 ENCOUNTER — Encounter (HOSPITAL_COMMUNITY)
Admission: RE | Admit: 2012-06-07 | Discharge: 2012-06-07 | Disposition: A | Discharge status: Home / Self Care | Payer: Self-pay | Source: Ambulatory Visit | Attending: Cardiology | Admitting: Cardiology

## 2012-06-09 ENCOUNTER — Encounter (HOSPITAL_COMMUNITY)
Admission: RE | Admit: 2012-06-09 | Discharge: 2012-06-09 | Disposition: A | Discharge status: Home / Self Care | Payer: Self-pay | Source: Ambulatory Visit | Attending: Cardiology | Admitting: Cardiology

## 2012-06-12 ENCOUNTER — Encounter (HOSPITAL_COMMUNITY)
Admission: RE | Admit: 2012-06-12 | Discharge: 2012-06-12 | Disposition: A | Discharge status: Home / Self Care | Payer: Self-pay | Source: Ambulatory Visit | Attending: Cardiology | Admitting: Cardiology

## 2012-06-14 ENCOUNTER — Encounter (HOSPITAL_COMMUNITY): Payer: Self-pay

## 2012-06-15 ENCOUNTER — Ambulatory Visit: Payer: Medicare Other

## 2012-06-15 ENCOUNTER — Ambulatory Visit (INDEPENDENT_AMBULATORY_CARE_PROVIDER_SITE_OTHER): Payer: Medicare Other

## 2012-06-15 DIAGNOSIS — D518 Other vitamin B12 deficiency anemias: Secondary | ICD-10-CM

## 2012-06-15 DIAGNOSIS — M810 Age-related osteoporosis without current pathological fracture: Secondary | ICD-10-CM | POA: Diagnosis not present

## 2012-06-15 DIAGNOSIS — M545 Low back pain: Secondary | ICD-10-CM | POA: Diagnosis not present

## 2012-06-15 DIAGNOSIS — M1610 Unilateral primary osteoarthritis, unspecified hip: Secondary | ICD-10-CM | POA: Diagnosis not present

## 2012-06-15 DIAGNOSIS — M5137 Other intervertebral disc degeneration, lumbosacral region: Secondary | ICD-10-CM | POA: Diagnosis not present

## 2012-06-15 MED ORDER — CYANOCOBALAMIN 1000 MCG/ML IJ SOLN
1000.0000 ug | Freq: Once | INTRAMUSCULAR | Status: AC
  Administered 2012-06-15: 1000 ug via INTRAMUSCULAR

## 2012-06-16 ENCOUNTER — Encounter (HOSPITAL_COMMUNITY)
Admission: RE | Admit: 2012-06-16 | Discharge: 2012-06-16 | Disposition: A | Discharge status: Home / Self Care | Payer: Self-pay | Source: Ambulatory Visit | Attending: Cardiology | Admitting: Cardiology

## 2012-06-19 ENCOUNTER — Encounter (HOSPITAL_COMMUNITY): Payer: Self-pay

## 2012-06-19 DIAGNOSIS — I4891 Unspecified atrial fibrillation: Secondary | ICD-10-CM | POA: Insufficient documentation

## 2012-06-19 DIAGNOSIS — I2510 Atherosclerotic heart disease of native coronary artery without angina pectoris: Secondary | ICD-10-CM | POA: Insufficient documentation

## 2012-06-19 DIAGNOSIS — Z9861 Coronary angioplasty status: Secondary | ICD-10-CM | POA: Insufficient documentation

## 2012-06-19 DIAGNOSIS — Z950 Presence of cardiac pacemaker: Secondary | ICD-10-CM | POA: Insufficient documentation

## 2012-06-19 DIAGNOSIS — F17200 Nicotine dependence, unspecified, uncomplicated: Secondary | ICD-10-CM | POA: Insufficient documentation

## 2012-06-19 DIAGNOSIS — Z951 Presence of aortocoronary bypass graft: Secondary | ICD-10-CM | POA: Insufficient documentation

## 2012-06-19 DIAGNOSIS — I10 Essential (primary) hypertension: Secondary | ICD-10-CM | POA: Insufficient documentation

## 2012-06-19 DIAGNOSIS — Z5189 Encounter for other specified aftercare: Secondary | ICD-10-CM | POA: Insufficient documentation

## 2012-06-19 DIAGNOSIS — E7800 Pure hypercholesterolemia, unspecified: Secondary | ICD-10-CM | POA: Insufficient documentation

## 2012-06-19 DIAGNOSIS — I252 Old myocardial infarction: Secondary | ICD-10-CM | POA: Insufficient documentation

## 2012-06-20 ENCOUNTER — Other Ambulatory Visit: Payer: Self-pay

## 2012-06-20 MED ORDER — ROSUVASTATIN CALCIUM 10 MG PO TABS: 10.0000 mg | ORAL_TABLET | Freq: Every day | ORAL | Status: DC

## 2012-06-21 ENCOUNTER — Encounter (HOSPITAL_COMMUNITY)
Admission: RE | Admit: 2012-06-21 | Discharge: 2012-06-21 | Disposition: A | Discharge status: Home / Self Care | Payer: Self-pay | Source: Ambulatory Visit | Attending: Cardiology | Admitting: Cardiology

## 2012-06-23 ENCOUNTER — Encounter (HOSPITAL_COMMUNITY)
Admission: RE | Admit: 2012-06-23 | Discharge: 2012-06-23 | Disposition: A | Discharge status: Home / Self Care | Payer: Self-pay | Source: Ambulatory Visit | Attending: Cardiology | Admitting: Cardiology

## 2012-06-26 ENCOUNTER — Encounter (HOSPITAL_COMMUNITY)
Admission: RE | Admit: 2012-06-26 | Discharge: 2012-06-26 | Disposition: A | Discharge status: Home / Self Care | Payer: Self-pay | Source: Ambulatory Visit | Attending: Cardiology | Admitting: Cardiology

## 2012-06-28 ENCOUNTER — Encounter (HOSPITAL_COMMUNITY)
Admission: RE | Admit: 2012-06-28 | Discharge: 2012-06-28 | Disposition: A | Discharge status: Home / Self Care | Payer: Self-pay | Source: Ambulatory Visit | Attending: Cardiology | Admitting: Cardiology

## 2012-06-28 ENCOUNTER — Ambulatory Visit (INDEPENDENT_AMBULATORY_CARE_PROVIDER_SITE_OTHER): Payer: Medicare Other | Admitting: *Deleted

## 2012-06-28 DIAGNOSIS — Z7901 Long term (current) use of anticoagulants: Secondary | ICD-10-CM | POA: Diagnosis not present

## 2012-06-28 DIAGNOSIS — I4891 Unspecified atrial fibrillation: Secondary | ICD-10-CM | POA: Diagnosis not present

## 2012-06-28 LAB — POCT INR: INR: 3

## 2012-06-30 ENCOUNTER — Encounter (HOSPITAL_COMMUNITY)
Admission: RE | Admit: 2012-06-30 | Discharge: 2012-06-30 | Disposition: A | Discharge status: Home / Self Care | Payer: Self-pay | Source: Ambulatory Visit | Attending: Cardiology | Admitting: Cardiology

## 2012-07-03 ENCOUNTER — Encounter (HOSPITAL_COMMUNITY): Payer: Self-pay

## 2012-07-05 ENCOUNTER — Encounter (HOSPITAL_COMMUNITY)
Admission: RE | Admit: 2012-07-05 | Discharge: 2012-07-05 | Disposition: A | Discharge status: Home / Self Care | Payer: Self-pay | Source: Ambulatory Visit | Attending: Cardiology | Admitting: Cardiology

## 2012-07-07 ENCOUNTER — Other Ambulatory Visit: Payer: Self-pay | Admitting: Cardiology

## 2012-07-07 ENCOUNTER — Encounter (HOSPITAL_COMMUNITY)
Admission: RE | Admit: 2012-07-07 | Discharge: 2012-07-07 | Disposition: A | Discharge status: Home / Self Care | Payer: Self-pay | Source: Ambulatory Visit | Attending: Cardiology | Admitting: Cardiology

## 2012-07-10 ENCOUNTER — Encounter (HOSPITAL_COMMUNITY): Payer: Self-pay

## 2012-07-12 ENCOUNTER — Encounter (HOSPITAL_COMMUNITY): Payer: Self-pay

## 2012-07-14 ENCOUNTER — Encounter (HOSPITAL_COMMUNITY)
Admission: RE | Admit: 2012-07-14 | Discharge: 2012-07-14 | Disposition: A | Discharge status: Home / Self Care | Payer: Self-pay | Source: Ambulatory Visit | Attending: Cardiology | Admitting: Cardiology

## 2012-07-17 ENCOUNTER — Encounter (HOSPITAL_COMMUNITY)
Admission: RE | Admit: 2012-07-17 | Discharge: 2012-07-17 | Disposition: A | Discharge status: Home / Self Care | Payer: Self-pay | Source: Ambulatory Visit | Attending: Cardiology | Admitting: Cardiology

## 2012-07-19 ENCOUNTER — Encounter (HOSPITAL_COMMUNITY)
Admission: RE | Admit: 2012-07-19 | Discharge: 2012-07-19 | Disposition: A | Discharge status: Home / Self Care | Payer: Self-pay | Source: Ambulatory Visit | Attending: Cardiology | Admitting: Cardiology

## 2012-07-21 ENCOUNTER — Encounter (HOSPITAL_COMMUNITY)
Admission: RE | Admit: 2012-07-21 | Discharge: 2012-07-21 | Disposition: A | Discharge status: Home / Self Care | Payer: Self-pay | Source: Ambulatory Visit | Attending: Cardiology | Admitting: Cardiology

## 2012-07-21 DIAGNOSIS — Z5189 Encounter for other specified aftercare: Secondary | ICD-10-CM | POA: Insufficient documentation

## 2012-07-21 DIAGNOSIS — I10 Essential (primary) hypertension: Secondary | ICD-10-CM | POA: Insufficient documentation

## 2012-07-21 DIAGNOSIS — Z950 Presence of cardiac pacemaker: Secondary | ICD-10-CM | POA: Insufficient documentation

## 2012-07-21 DIAGNOSIS — I4891 Unspecified atrial fibrillation: Secondary | ICD-10-CM | POA: Insufficient documentation

## 2012-07-21 DIAGNOSIS — Z9861 Coronary angioplasty status: Secondary | ICD-10-CM | POA: Insufficient documentation

## 2012-07-21 DIAGNOSIS — I2510 Atherosclerotic heart disease of native coronary artery without angina pectoris: Secondary | ICD-10-CM | POA: Insufficient documentation

## 2012-07-21 DIAGNOSIS — Z951 Presence of aortocoronary bypass graft: Secondary | ICD-10-CM | POA: Insufficient documentation

## 2012-07-21 DIAGNOSIS — E7800 Pure hypercholesterolemia, unspecified: Secondary | ICD-10-CM | POA: Insufficient documentation

## 2012-07-21 DIAGNOSIS — F17200 Nicotine dependence, unspecified, uncomplicated: Secondary | ICD-10-CM | POA: Insufficient documentation

## 2012-07-21 DIAGNOSIS — I252 Old myocardial infarction: Secondary | ICD-10-CM | POA: Insufficient documentation

## 2012-07-24 ENCOUNTER — Encounter (HOSPITAL_COMMUNITY)
Admission: RE | Admit: 2012-07-24 | Discharge: 2012-07-24 | Disposition: A | Discharge status: Home / Self Care | Payer: Self-pay | Source: Ambulatory Visit | Attending: Cardiology | Admitting: Cardiology

## 2012-07-26 ENCOUNTER — Encounter (HOSPITAL_COMMUNITY)
Admission: RE | Admit: 2012-07-26 | Discharge: 2012-07-26 | Disposition: A | Discharge status: Home / Self Care | Payer: Self-pay | Source: Ambulatory Visit | Attending: Cardiology | Admitting: Cardiology

## 2012-07-28 ENCOUNTER — Encounter (HOSPITAL_COMMUNITY)
Admission: RE | Admit: 2012-07-28 | Discharge: 2012-07-28 | Disposition: A | Discharge status: Home / Self Care | Payer: Self-pay | Source: Ambulatory Visit | Attending: Cardiology | Admitting: Cardiology

## 2012-07-31 ENCOUNTER — Encounter (HOSPITAL_COMMUNITY)
Admission: RE | Admit: 2012-07-31 | Discharge: 2012-07-31 | Disposition: A | Discharge status: Home / Self Care | Payer: Self-pay | Source: Ambulatory Visit | Attending: Cardiology | Admitting: Cardiology

## 2012-08-02 ENCOUNTER — Encounter (HOSPITAL_COMMUNITY)
Admission: RE | Admit: 2012-08-02 | Discharge: 2012-08-02 | Disposition: A | Discharge status: Home / Self Care | Payer: Self-pay | Source: Ambulatory Visit | Attending: Cardiology | Admitting: Cardiology

## 2012-08-03 ENCOUNTER — Ambulatory Visit: Payer: Medicare Other

## 2012-08-03 ENCOUNTER — Encounter: Payer: Self-pay | Admitting: Internal Medicine

## 2012-08-03 ENCOUNTER — Ambulatory Visit (INDEPENDENT_AMBULATORY_CARE_PROVIDER_SITE_OTHER): Payer: Medicare Other | Admitting: Internal Medicine

## 2012-08-03 VITALS — BP 120/78 | HR 74 | Temp 98.3°F | Resp 16 | Wt 171.5 lb

## 2012-08-03 DIAGNOSIS — D518 Other vitamin B12 deficiency anemias: Secondary | ICD-10-CM

## 2012-08-03 DIAGNOSIS — I10 Essential (primary) hypertension: Secondary | ICD-10-CM | POA: Diagnosis not present

## 2012-08-03 DIAGNOSIS — I4891 Unspecified atrial fibrillation: Secondary | ICD-10-CM

## 2012-08-03 MED ORDER — CYANOCOBALAMIN 1000 MCG/ML IJ SOLN: 1000.0000 ug | Freq: Once | INTRAMUSCULAR | Status: DC

## 2012-08-03 MED ORDER — CYANOCOBALAMIN 1000 MCG/ML IJ SOLN
1000.0000 ug | Freq: Once | INTRAMUSCULAR | Status: AC
  Administered 2012-08-03: 1000 ug via INTRAMUSCULAR

## 2012-08-03 NOTE — Assessment & Plan Note (Signed)
His BP is well controlled, he will continue with the current med

## 2012-08-03 NOTE — Patient Instructions (Signed)

## 2012-08-03 NOTE — Assessment & Plan Note (Signed)
He has good rate and rhythm control 

## 2012-08-03 NOTE — Progress Notes (Signed)
  Subjective:    Patient ID: Dwayne [name], male    DOB: 05-01-1932, 78 y.o.   MRN: [id number]  Hypertension This is a chronic problem. The current episode started more than 1 year ago. The problem has been gradually improving since onset. The problem is controlled. Pertinent negatives include no anxiety, blurred vision, chest pain, headaches, malaise/fatigue, neck pain, orthopnea, palpitations, peripheral edema, PND, shortness of breath or sweats. Past treatments include beta blockers. The current treatment provides significant improvement. There are no compliance problems.  Hypertensive end-organ damage includes CAD/MI.      Review of Systems  Constitutional: Negative for fever, chills, malaise/fatigue, diaphoresis, activity change, appetite change, fatigue and unexpected weight change.  HENT: Negative.  Negative for neck pain.   Eyes: Negative.  Negative for blurred vision.  Respiratory: Negative for cough, choking, chest tightness, shortness of breath, wheezing and stridor.   Cardiovascular: Negative for chest pain, palpitations, orthopnea, leg swelling and PND.  Gastrointestinal: Negative for nausea, vomiting, abdominal pain, diarrhea, constipation and anal bleeding.  Genitourinary: Negative.   Musculoskeletal: Negative.  Negative for myalgias, back pain, joint swelling, arthralgias and gait problem.  Skin: Negative.   Neurological: Negative.  Negative for headaches.  Hematological: Negative for adenopathy. Does not bruise/bleed easily.  Psychiatric/Behavioral: Negative.        Objective:   Physical Exam  Vitals reviewed. Constitutional: He is oriented to person, place, and time. He appears well-developed and well-nourished. No distress.  HENT:  Head: Normocephalic and atraumatic.  Mouth/Throat: Oropharynx is clear and moist. No oropharyngeal exudate.  Eyes: Conjunctivae are normal. Right eye exhibits no discharge. Left eye exhibits no discharge. No scleral icterus.  Neck: Normal  range of motion. Neck supple. No JVD present. No tracheal deviation present. No thyromegaly present.  Cardiovascular: Normal rate, regular rhythm, normal heart sounds and intact distal pulses.  Exam reveals no gallop and no friction rub.   No murmur heard. Pulmonary/Chest: Effort normal and breath sounds normal. No stridor. No respiratory distress. He has no wheezes. He has no rales. He exhibits no tenderness.  Abdominal: Soft. Bowel sounds are normal. He exhibits no distension and no mass. There is no tenderness. There is no rebound and no guarding.  Musculoskeletal: Normal range of motion. He exhibits no edema and no tenderness.  Lymphadenopathy:    He has no cervical adenopathy.  Neurological: He is oriented to person, place, and time.  Skin: Skin is warm and dry. No rash noted. He is not diaphoretic. No erythema. No pallor.  Psychiatric: He has a normal mood and affect. His behavior is normal. Judgment and thought content normal.      Lab Results  Component Value Date   WBC 10.0 04/28/2012   HGB 12.9* 04/28/2012   HCT 41.6* 04/28/2012   PLT 246.0 07/20/2011   GLUCOSE 104* 03/14/2012   CHOL 122 02/22/2012   TRIG 94.0 02/22/2012   HDL 45.00 02/22/2012   LDLCALC 58 02/22/2012   ALT 20 02/22/2012   AST 23 02/22/2012   NA 139 03/14/2012   K 4.1 03/14/2012   CL 106 03/14/2012   CREATININE 0.9 03/14/2012   BUN 16 03/14/2012   CO2 27 03/14/2012   TSH 0.90 02/22/2012   INR 3.0 06/28/2012   HGBA1C 6.2 03/14/2012      Assessment & Plan:

## 2012-08-04 ENCOUNTER — Encounter (HOSPITAL_COMMUNITY)
Admission: RE | Admit: 2012-08-04 | Discharge: 2012-08-04 | Disposition: A | Discharge status: Home / Self Care | Payer: Self-pay | Source: Ambulatory Visit | Attending: Cardiology | Admitting: Cardiology

## 2012-08-07 ENCOUNTER — Encounter (HOSPITAL_COMMUNITY)
Admission: RE | Admit: 2012-08-07 | Discharge: 2012-08-07 | Disposition: A | Discharge status: Home / Self Care | Payer: Self-pay | Source: Ambulatory Visit | Attending: Cardiology | Admitting: Cardiology

## 2012-08-09 ENCOUNTER — Encounter (HOSPITAL_COMMUNITY)
Admission: RE | Admit: 2012-08-09 | Discharge: 2012-08-09 | Disposition: A | Discharge status: Home / Self Care | Payer: Self-pay | Source: Ambulatory Visit | Attending: Cardiology | Admitting: Cardiology

## 2012-08-09 ENCOUNTER — Ambulatory Visit (INDEPENDENT_AMBULATORY_CARE_PROVIDER_SITE_OTHER): Payer: Medicare Other | Admitting: Pharmacist

## 2012-08-09 DIAGNOSIS — Z7901 Long term (current) use of anticoagulants: Secondary | ICD-10-CM

## 2012-08-09 DIAGNOSIS — I4891 Unspecified atrial fibrillation: Secondary | ICD-10-CM | POA: Diagnosis not present

## 2012-08-09 LAB — POCT INR: INR: 2.5

## 2012-08-11 ENCOUNTER — Encounter (HOSPITAL_COMMUNITY)
Admission: RE | Admit: 2012-08-11 | Discharge: 2012-08-11 | Disposition: A | Discharge status: Home / Self Care | Payer: Self-pay | Source: Ambulatory Visit | Attending: Cardiology | Admitting: Cardiology

## 2012-08-14 ENCOUNTER — Encounter (HOSPITAL_COMMUNITY)
Admission: RE | Admit: 2012-08-14 | Discharge: 2012-08-14 | Disposition: A | Discharge status: Home / Self Care | Payer: Self-pay | Source: Ambulatory Visit | Attending: Cardiology | Admitting: Cardiology

## 2012-08-16 ENCOUNTER — Encounter (HOSPITAL_COMMUNITY)
Admission: RE | Admit: 2012-08-16 | Discharge: 2012-08-16 | Disposition: A | Discharge status: Home / Self Care | Payer: Self-pay | Source: Ambulatory Visit | Attending: Cardiology | Admitting: Cardiology

## 2012-08-17 ENCOUNTER — Ambulatory Visit (INDEPENDENT_AMBULATORY_CARE_PROVIDER_SITE_OTHER): Payer: Medicare Other | Admitting: *Deleted

## 2012-08-17 DIAGNOSIS — Z950 Presence of cardiac pacemaker: Secondary | ICD-10-CM

## 2012-08-17 DIAGNOSIS — I4891 Unspecified atrial fibrillation: Secondary | ICD-10-CM

## 2012-08-18 ENCOUNTER — Encounter (HOSPITAL_COMMUNITY): Payer: Self-pay

## 2012-08-18 ENCOUNTER — Encounter: Payer: Self-pay | Admitting: Cardiology

## 2012-08-18 ENCOUNTER — Encounter: Payer: Self-pay | Admitting: Internal Medicine

## 2012-08-18 ENCOUNTER — Ambulatory Visit (INDEPENDENT_AMBULATORY_CARE_PROVIDER_SITE_OTHER): Payer: Medicare Other | Admitting: Cardiology

## 2012-08-18 VITALS — BP 140/84 | HR 66 | Ht 70.0 in | Wt 175.0 lb

## 2012-08-18 DIAGNOSIS — I25810 Atherosclerosis of coronary artery bypass graft(s) without angina pectoris: Secondary | ICD-10-CM

## 2012-08-18 DIAGNOSIS — E785 Hyperlipidemia, unspecified: Secondary | ICD-10-CM

## 2012-08-18 DIAGNOSIS — I4891 Unspecified atrial fibrillation: Secondary | ICD-10-CM

## 2012-08-18 LAB — REMOTE PACEMAKER DEVICE
AL AMPLITUDE: 2.8 mv
BRDY-0002RV: 60 {beats}/min
RV LEAD IMPEDENCE PM: 476 Ohm
RV LEAD THRESHOLD: 0.75 V
VENTRICULAR PACING PM: 95

## 2012-08-18 NOTE — Progress Notes (Signed)
HPI:  Patient is seen in followup. From a cardiac standpoint he continues to get along well. He is staying in cardiac rehabilitation program, predominantly in a graduate program. He goes 3 times per week. He's had no new symptoms. Generally he is getting along well. He denies any current chest pain.  Current Outpatient Prescriptions  Medication Sig Dispense Refill  . alendronate (FOSAMAX) 70 MG tablet Take 70 mg by mouth every 7 (seven) days. Take with a full glass of water on an empty stomach.       [name] Kitchen aspirin 81 MG tablet Take 81 mg by mouth daily.        . Calcium-Magnesium-Vitamin D (CITRACAL CALCIUM+D) 600-40-500 MG-MG-UNIT TB24 Take 1 tablet by mouth 2 (two) times daily. Taking 650 daily      . cyanocobalamin (,VITAMIN B-12,) 1000 MCG/ML injection Inject 1 mL (1,000 mcg total) into the muscle once.  10 mL  5  . fexofenadine (ALLEGRA) 180 MG tablet Take 180 mg by mouth daily.        . finasteride (PROSCAR) 5 MG tablet Take 1 tablet (5 mg total) by mouth daily.  90 tablet  3  . fluticasone (FLONASE) 50 MCG/ACT nasal spray Place 2 sprays into the nose daily.  16 g  11  . metoprolol tartrate (LOPRESSOR) 25 MG tablet Take 1 tablet (25 mg total) by mouth 2 (two) times daily.  180 tablet  6  . NITROSTAT 0.4 MG SL tablet place 1 tablet under the tongue every 5 minutes for UP TO 3 doses if needed for angina as directed b  100 tablet  PRN  . omeprazole (PRILOSEC) 40 MG capsule Take 40 mg by mouth daily.        . polyethylene glycol (MIRALAX / GLYCOLAX) packet Take 17 g by mouth daily.      . pregabalin (LYRICA) 75 MG capsule Take 75 mg by mouth daily.        . rosuvastatin (CRESTOR) 10 MG tablet Take 1 tablet (10 mg total) by mouth daily.  90 tablet  3  . terazosin (HYTRIN) 5 MG capsule Take 1 capsule (5 mg total) by mouth daily.  90 capsule  1  . warfarin (COUMADIN) 1 MG tablet Take 1 tablet (1 mg total) by mouth as directed.  90 tablet  2  . zolpidem (AMBIEN) 10 MG tablet Take 10 mg by mouth at  bedtime as needed.          Allergies  Allergen Reactions  . Clindamycin     REACTION: upset stomach  . Doxycycline Hyclate     REACTION: n/v/d  . Erythromycin     REACTION: n/v/d  . Metronidazole     REACTION: rash, itching  . Penicillins     REACTION: hives  . Sulfonamide Derivatives     REACTION: hives  . Tetracycline     REACTION: n/v/d    Past Medical History  Diagnosis Date  . Arrhythmia   . Hyperlipidemia   . Bronchiectasis without acute exacerbation   . Disorders of diaphragm   . Plantar fascial fibromatosis   . Epistaxis   . Unspecified sinusitis (chronic)   . Allergic rhinitis, cause unspecified   . Spinal stenosis, unspecified region other than cervical     Past Surgical History  Procedure Date  . Coronary artery bypass graft   . Insert / replace / remove pacemaker     2007  . Kidney cyst removal   . Doppler echocardiography 2011  Family History  Problem Relation Age of Onset  . Cancer Neg Hx   . Heart disease Neg Hx   . Stroke Neg Hx     History   Social History  . Marital Status: Married    Spouse Name: N/A    Number of Children: 4  . Years of Education: N/A   Occupational History  .     Social History Main Topics  . Smoking status: Never Smoker   . Smokeless tobacco: Not on file  . Alcohol Use: No  . Drug Use: No  . Sexually Active: Yes   Other Topics Concern  . Not on file   Social History Narrative  . No narrative on file    ROS: Please see the HPI.  All other systems reviewed and negative.  PHYSICAL EXAM:  BP 140/84  Pulse 66  Ht 5\' 10"  (1.778 m)  Wt 175 lb (79.379 kg)  BMI 25.11 kg/m2  General: Well developed, well nourished, in no acute distress. Head:  Normocephalic and atraumatic. Neck: no JVD Lungs: Clear to auscultation and percussion. Heart: Normal S1 and S2.  No murmur, rubs or gallops.  Abdomen:  Normal bowel sounds; soft; non tender; no organomegaly Pulses: Pulses normal in all 4  extremities. Extremities: No clubbing or cyanosis. No edema. Neurologic: Alert and oriented x 3.  EKG:  Atrial fib at baseline.  Controlled ventricular response.    ASSESSMENT AND PLAN:

## 2012-08-18 NOTE — Patient Instructions (Signed)
Your physician wants you to follow-up in: January 2014. You will receive a reminder letter in the mail two months in advance. If you don't receive a letter, please call our office to schedule the follow-up appointment.  Your physician recommends that you continue on your current medications as directed. Please refer to the Current Medication list given to you today.

## 2012-08-18 NOTE — Assessment & Plan Note (Signed)
No current symptoms.  Staying in cardiac rehab without symptoms.

## 2012-08-18 NOTE — Assessment & Plan Note (Signed)
Stable at the present time.  No new symptoms.  Remains on warfarin without bleeding.

## 2012-08-21 ENCOUNTER — Encounter (HOSPITAL_COMMUNITY): Payer: Self-pay

## 2012-08-22 DIAGNOSIS — H353190 Nonexudative age-related macular degeneration, unspecified eye, stage unspecified: Secondary | ICD-10-CM | POA: Diagnosis not present

## 2012-08-23 ENCOUNTER — Encounter: Payer: Self-pay | Admitting: Pharmacist

## 2012-08-23 ENCOUNTER — Ambulatory Visit (INDEPENDENT_AMBULATORY_CARE_PROVIDER_SITE_OTHER): Payer: Medicare Other | Admitting: General Practice

## 2012-08-23 ENCOUNTER — Encounter (HOSPITAL_COMMUNITY)
Admission: RE | Admit: 2012-08-23 | Discharge: 2012-08-23 | Disposition: A | Discharge status: Home / Self Care | Payer: Self-pay | Source: Ambulatory Visit | Attending: Cardiology | Admitting: Cardiology

## 2012-08-23 DIAGNOSIS — I10 Essential (primary) hypertension: Secondary | ICD-10-CM | POA: Insufficient documentation

## 2012-08-23 DIAGNOSIS — I2510 Atherosclerotic heart disease of native coronary artery without angina pectoris: Secondary | ICD-10-CM | POA: Insufficient documentation

## 2012-08-23 DIAGNOSIS — I252 Old myocardial infarction: Secondary | ICD-10-CM | POA: Insufficient documentation

## 2012-08-23 DIAGNOSIS — Z5189 Encounter for other specified aftercare: Secondary | ICD-10-CM | POA: Insufficient documentation

## 2012-08-23 DIAGNOSIS — Z9861 Coronary angioplasty status: Secondary | ICD-10-CM | POA: Insufficient documentation

## 2012-08-23 DIAGNOSIS — E7800 Pure hypercholesterolemia, unspecified: Secondary | ICD-10-CM | POA: Insufficient documentation

## 2012-08-23 DIAGNOSIS — Z951 Presence of aortocoronary bypass graft: Secondary | ICD-10-CM | POA: Insufficient documentation

## 2012-08-23 DIAGNOSIS — D518 Other vitamin B12 deficiency anemias: Secondary | ICD-10-CM

## 2012-08-23 DIAGNOSIS — I4891 Unspecified atrial fibrillation: Secondary | ICD-10-CM | POA: Insufficient documentation

## 2012-08-23 DIAGNOSIS — F17200 Nicotine dependence, unspecified, uncomplicated: Secondary | ICD-10-CM | POA: Insufficient documentation

## 2012-08-23 DIAGNOSIS — Z950 Presence of cardiac pacemaker: Secondary | ICD-10-CM | POA: Insufficient documentation

## 2012-08-23 MED ORDER — CYANOCOBALAMIN 1000 MCG/ML IJ SOLN
1000.0000 ug | Freq: Once | INTRAMUSCULAR | Status: AC
  Administered 2012-08-23: 1000 ug via INTRAMUSCULAR

## 2012-08-25 ENCOUNTER — Encounter (HOSPITAL_COMMUNITY)
Admission: RE | Admit: 2012-08-25 | Discharge: 2012-08-25 | Disposition: A | Discharge status: Home / Self Care | Payer: Self-pay | Source: Ambulatory Visit | Attending: Cardiology | Admitting: Cardiology

## 2012-08-28 ENCOUNTER — Encounter (HOSPITAL_COMMUNITY)
Admission: RE | Admit: 2012-08-28 | Discharge: 2012-08-28 | Disposition: A | Discharge status: Home / Self Care | Payer: Self-pay | Source: Ambulatory Visit | Attending: Cardiology | Admitting: Cardiology

## 2012-08-30 ENCOUNTER — Encounter (HOSPITAL_COMMUNITY): Payer: Self-pay

## 2012-09-01 ENCOUNTER — Encounter (HOSPITAL_COMMUNITY): Payer: Self-pay

## 2012-09-04 ENCOUNTER — Encounter (HOSPITAL_COMMUNITY): Payer: Self-pay

## 2012-09-04 NOTE — Assessment & Plan Note (Signed)
LDL was at target.

## 2012-09-06 ENCOUNTER — Encounter (HOSPITAL_COMMUNITY): Payer: Self-pay

## 2012-09-08 ENCOUNTER — Encounter (HOSPITAL_COMMUNITY): Payer: Self-pay

## 2012-09-11 ENCOUNTER — Encounter (HOSPITAL_COMMUNITY): Payer: Self-pay

## 2012-09-13 ENCOUNTER — Encounter (HOSPITAL_COMMUNITY): Payer: Self-pay

## 2012-09-13 ENCOUNTER — Ambulatory Visit (INDEPENDENT_AMBULATORY_CARE_PROVIDER_SITE_OTHER): Payer: Medicare Other | Admitting: *Deleted

## 2012-09-13 ENCOUNTER — Encounter: Payer: Self-pay | Admitting: *Deleted

## 2012-09-13 DIAGNOSIS — D518 Other vitamin B12 deficiency anemias: Secondary | ICD-10-CM

## 2012-09-13 MED ORDER — CYANOCOBALAMIN 1000 MCG/ML IJ SOLN
1000.0000 ug | Freq: Once | INTRAMUSCULAR | Status: AC
  Administered 2012-09-13: 1000 ug via INTRAMUSCULAR

## 2012-09-15 ENCOUNTER — Encounter (HOSPITAL_COMMUNITY): Payer: Self-pay

## 2012-09-18 ENCOUNTER — Encounter (HOSPITAL_COMMUNITY): Payer: Self-pay

## 2012-09-20 ENCOUNTER — Encounter (HOSPITAL_COMMUNITY)
Admission: RE | Admit: 2012-09-20 | Discharge: 2012-09-20 | Disposition: A | Discharge status: Home / Self Care | Payer: Self-pay | Source: Ambulatory Visit | Attending: Cardiology | Admitting: Cardiology

## 2012-09-20 ENCOUNTER — Ambulatory Visit (INDEPENDENT_AMBULATORY_CARE_PROVIDER_SITE_OTHER): Payer: Medicare Other | Admitting: *Deleted

## 2012-09-20 DIAGNOSIS — I2510 Atherosclerotic heart disease of native coronary artery without angina pectoris: Secondary | ICD-10-CM | POA: Insufficient documentation

## 2012-09-20 DIAGNOSIS — Z5189 Encounter for other specified aftercare: Secondary | ICD-10-CM | POA: Insufficient documentation

## 2012-09-20 DIAGNOSIS — Z950 Presence of cardiac pacemaker: Secondary | ICD-10-CM | POA: Insufficient documentation

## 2012-09-20 DIAGNOSIS — I4891 Unspecified atrial fibrillation: Secondary | ICD-10-CM | POA: Diagnosis not present

## 2012-09-20 DIAGNOSIS — E7800 Pure hypercholesterolemia, unspecified: Secondary | ICD-10-CM | POA: Insufficient documentation

## 2012-09-20 DIAGNOSIS — Z951 Presence of aortocoronary bypass graft: Secondary | ICD-10-CM | POA: Insufficient documentation

## 2012-09-20 DIAGNOSIS — Z7901 Long term (current) use of anticoagulants: Secondary | ICD-10-CM | POA: Diagnosis not present

## 2012-09-20 DIAGNOSIS — I10 Essential (primary) hypertension: Secondary | ICD-10-CM | POA: Insufficient documentation

## 2012-09-20 DIAGNOSIS — Z9861 Coronary angioplasty status: Secondary | ICD-10-CM | POA: Insufficient documentation

## 2012-09-20 DIAGNOSIS — F17200 Nicotine dependence, unspecified, uncomplicated: Secondary | ICD-10-CM | POA: Insufficient documentation

## 2012-09-20 DIAGNOSIS — I252 Old myocardial infarction: Secondary | ICD-10-CM | POA: Insufficient documentation

## 2012-09-20 LAB — POCT INR: INR: 3.8

## 2012-09-21 DIAGNOSIS — H26499 Other secondary cataract, unspecified eye: Secondary | ICD-10-CM | POA: Diagnosis not present

## 2012-09-22 ENCOUNTER — Encounter (HOSPITAL_COMMUNITY)
Admission: RE | Admit: 2012-09-22 | Discharge: 2012-09-22 | Disposition: A | Discharge status: Home / Self Care | Payer: Self-pay | Source: Ambulatory Visit | Attending: Cardiology | Admitting: Cardiology

## 2012-09-25 ENCOUNTER — Encounter (HOSPITAL_COMMUNITY): Payer: Self-pay

## 2012-09-27 ENCOUNTER — Encounter (HOSPITAL_COMMUNITY)
Admission: RE | Admit: 2012-09-27 | Discharge: 2012-09-27 | Disposition: A | Discharge status: Home / Self Care | Payer: Self-pay | Source: Ambulatory Visit | Attending: Cardiology | Admitting: Cardiology

## 2012-09-27 DIAGNOSIS — M79609 Pain in unspecified limb: Secondary | ICD-10-CM | POA: Diagnosis not present

## 2012-09-27 DIAGNOSIS — M25569 Pain in unspecified knee: Secondary | ICD-10-CM | POA: Diagnosis not present

## 2012-09-27 DIAGNOSIS — M545 Low back pain: Secondary | ICD-10-CM | POA: Diagnosis not present

## 2012-09-29 ENCOUNTER — Encounter (HOSPITAL_COMMUNITY): Payer: Self-pay

## 2012-10-02 ENCOUNTER — Encounter (HOSPITAL_COMMUNITY): Payer: Self-pay

## 2012-10-04 ENCOUNTER — Encounter (HOSPITAL_COMMUNITY): Payer: Self-pay

## 2012-10-05 ENCOUNTER — Ambulatory Visit: Payer: Medicare Other

## 2012-10-06 ENCOUNTER — Encounter (HOSPITAL_COMMUNITY): Payer: Self-pay

## 2012-10-09 ENCOUNTER — Encounter (HOSPITAL_COMMUNITY): Payer: Self-pay

## 2012-10-10 ENCOUNTER — Ambulatory Visit: Payer: Medicare Other

## 2012-10-11 ENCOUNTER — Encounter (HOSPITAL_COMMUNITY): Payer: Self-pay

## 2012-10-11 ENCOUNTER — Other Ambulatory Visit: Payer: Self-pay | Admitting: Orthopaedic Surgery

## 2012-10-11 ENCOUNTER — Ambulatory Visit
Admission: RE | Admit: 2012-10-11 | Discharge: 2012-10-11 | Disposition: A | Discharge status: Home / Self Care | Payer: Medicare Other | Source: Ambulatory Visit | Attending: Orthopaedic Surgery | Admitting: Orthopaedic Surgery

## 2012-10-11 DIAGNOSIS — M545 Low back pain: Secondary | ICD-10-CM

## 2012-10-11 DIAGNOSIS — S32009A Unspecified fracture of unspecified lumbar vertebra, initial encounter for closed fracture: Secondary | ICD-10-CM | POA: Diagnosis not present

## 2012-10-11 DIAGNOSIS — M5410 Radiculopathy, site unspecified: Secondary | ICD-10-CM

## 2012-10-13 ENCOUNTER — Encounter (HOSPITAL_COMMUNITY): Payer: Self-pay

## 2012-10-16 ENCOUNTER — Encounter (HOSPITAL_COMMUNITY): Payer: Self-pay

## 2012-10-17 ENCOUNTER — Telehealth: Payer: Self-pay | Admitting: Cardiology

## 2012-10-17 ENCOUNTER — Encounter (HOSPITAL_COMMUNITY): Payer: Self-pay | Admitting: Emergency Medicine

## 2012-10-17 ENCOUNTER — Other Ambulatory Visit (HOSPITAL_COMMUNITY): Payer: Self-pay | Admitting: Interventional Radiology

## 2012-10-17 ENCOUNTER — Inpatient Hospital Stay (HOSPITAL_COMMUNITY)
Admission: EM | Admit: 2012-10-17 | Discharge: 2012-10-27 | DRG: 479 | Disposition: A | Discharge status: Skilled Nursing Facility | Payer: Medicare Other | Attending: Internal Medicine | Admitting: Internal Medicine

## 2012-10-17 ENCOUNTER — Telehealth (HOSPITAL_COMMUNITY): Payer: Self-pay

## 2012-10-17 DIAGNOSIS — Z7401 Bed confinement status: Secondary | ICD-10-CM

## 2012-10-17 DIAGNOSIS — S32012K Unstable burst fracture of first lumbar vertebra, subsequent encounter for fracture with nonunion: Secondary | ICD-10-CM

## 2012-10-17 DIAGNOSIS — IMO0002 Reserved for concepts with insufficient information to code with codable children: Secondary | ICD-10-CM

## 2012-10-17 DIAGNOSIS — K5900 Constipation, unspecified: Secondary | ICD-10-CM

## 2012-10-17 DIAGNOSIS — K5909 Other constipation: Secondary | ICD-10-CM | POA: Diagnosis present

## 2012-10-17 DIAGNOSIS — Z7982 Long term (current) use of aspirin: Secondary | ICD-10-CM

## 2012-10-17 DIAGNOSIS — K2990 Gastroduodenitis, unspecified, without bleeding: Secondary | ICD-10-CM | POA: Diagnosis not present

## 2012-10-17 DIAGNOSIS — K5641 Fecal impaction: Secondary | ICD-10-CM

## 2012-10-17 DIAGNOSIS — M549 Dorsalgia, unspecified: Secondary | ICD-10-CM

## 2012-10-17 DIAGNOSIS — I4891 Unspecified atrial fibrillation: Secondary | ICD-10-CM | POA: Diagnosis not present

## 2012-10-17 DIAGNOSIS — N400 Enlarged prostate without lower urinary tract symptoms: Secondary | ICD-10-CM

## 2012-10-17 DIAGNOSIS — I10 Essential (primary) hypertension: Secondary | ICD-10-CM

## 2012-10-17 DIAGNOSIS — T40605A Adverse effect of unspecified narcotics, initial encounter: Secondary | ICD-10-CM | POA: Diagnosis present

## 2012-10-17 DIAGNOSIS — J479 Bronchiectasis, uncomplicated: Secondary | ICD-10-CM | POA: Diagnosis present

## 2012-10-17 DIAGNOSIS — Z7901 Long term (current) use of anticoagulants: Secondary | ICD-10-CM

## 2012-10-17 DIAGNOSIS — Z950 Presence of cardiac pacemaker: Secondary | ICD-10-CM

## 2012-10-17 DIAGNOSIS — X58XXXA Exposure to other specified factors, initial encounter: Secondary | ICD-10-CM | POA: Diagnosis present

## 2012-10-17 DIAGNOSIS — I2510 Atherosclerotic heart disease of native coronary artery without angina pectoris: Secondary | ICD-10-CM | POA: Diagnosis present

## 2012-10-17 DIAGNOSIS — I25810 Atherosclerosis of coronary artery bypass graft(s) without angina pectoris: Secondary | ICD-10-CM | POA: Diagnosis not present

## 2012-10-17 DIAGNOSIS — R296 Repeated falls: Secondary | ICD-10-CM

## 2012-10-17 DIAGNOSIS — Z79899 Other long term (current) drug therapy: Secondary | ICD-10-CM

## 2012-10-17 DIAGNOSIS — Z9861 Coronary angioplasty status: Secondary | ICD-10-CM

## 2012-10-17 DIAGNOSIS — Z951 Presence of aortocoronary bypass graft: Secondary | ICD-10-CM

## 2012-10-17 DIAGNOSIS — R5381 Other malaise: Secondary | ICD-10-CM | POA: Diagnosis not present

## 2012-10-17 DIAGNOSIS — S32009A Unspecified fracture of unspecified lumbar vertebra, initial encounter for closed fracture: Principal | ICD-10-CM | POA: Diagnosis present

## 2012-10-17 DIAGNOSIS — E785 Hyperlipidemia, unspecified: Secondary | ICD-10-CM | POA: Diagnosis present

## 2012-10-17 HISTORY — DX: Acute myocardial infarction, unspecified: I21.9

## 2012-10-17 HISTORY — DX: Unspecified atrial fibrillation: I48.91

## 2012-10-17 HISTORY — DX: Sick sinus syndrome: I49.5

## 2012-10-17 HISTORY — DX: Gastro-esophageal reflux disease without esophagitis: K21.9

## 2012-10-17 HISTORY — DX: Atherosclerotic heart disease of native coronary artery without angina pectoris: I25.10

## 2012-10-17 HISTORY — DX: Essential (primary) hypertension: I10

## 2012-10-17 HISTORY — DX: Unspecified osteoarthritis, unspecified site: M19.90

## 2012-10-17 LAB — PROTIME-INR
INR: 3.05 — ABNORMAL HIGH (ref 0.00–1.49)
Prothrombin Time: 29.9 seconds — ABNORMAL HIGH (ref 11.6–15.2)

## 2012-10-17 LAB — BASIC METABOLIC PANEL
BUN: 26 mg/dL — ABNORMAL HIGH (ref 6–23)
CO2: 24 mEq/L (ref 19–32)
Calcium: 9.2 mg/dL (ref 8.4–10.5)
Chloride: 103 mEq/L (ref 96–112)
Creatinine, Ser: 0.77 mg/dL (ref 0.50–1.35)
GFR calc Af Amer: >90 mL/min (ref >90)
GFR calc non Af Amer: 85 mL/min — ABNORMAL LOW (ref >90)
Glucose, Bld: 112 mg/dL — ABNORMAL HIGH (ref 70–99)
Potassium: 3.8 mEq/L (ref 3.5–5.1)
Sodium: 138 mEq/L (ref 135–145)

## 2012-10-17 LAB — CBC
HCT: 38.1 % — ABNORMAL LOW (ref 39.0–52.0)
Hemoglobin: 12.7 g/dL — ABNORMAL LOW (ref 13.0–17.0)
MCH: 29.8 pg (ref 26.0–34.0)
MCHC: 33.3 g/dL (ref 30.0–36.0)
MCV: 89.4 fL (ref 78.0–100.0)
Platelets: 378 10*3/uL (ref 150–400)
RBC: 4.26 MIL/uL (ref 4.22–5.81)
RDW: 15.6 % — ABNORMAL HIGH (ref 11.5–15.5)
WBC: 14.1 10*3/uL — ABNORMAL HIGH (ref 4.0–10.5)

## 2012-10-17 MED ORDER — POLYETHYLENE GLYCOL 3350 17 G PO PACK: 17.0000 g | PACK | Freq: Two times a day (BID) | ORAL | Status: DC

## 2012-10-17 MED ORDER — DOCUSATE SODIUM 100 MG PO CAPS: 100.0000 mg | ORAL_CAPSULE | Freq: Two times a day (BID) | ORAL | Status: DC

## 2012-10-17 MED ORDER — MORPHINE SULFATE 4 MG/ML IJ SOLN
4.0000 mg | Freq: Once | INTRAMUSCULAR | Status: AC
  Administered 2012-10-17: 4 mg via INTRAVENOUS
  Filled 2012-10-17: qty 1

## 2012-10-17 NOTE — ED Notes (Signed)
Clinical Social Work Department BRIEF PSYCHOSOCIAL ASSESSMENT 10/17/2012  Patient:  Dwayne [name], Dwayne [name]     Account Number:  [id number]     Admit date:  10/17/2012  Clinical Social Worker:  Illene Silver  Date/Time:  10/17/2012 10:47 PM  Referred by:  Physician  Date Referred:  10/17/2012 Referred for  SNF Placement   Other Referral:   Interview type:  Patient Other interview type:    PSYCHOSOCIAL DATA Living Status:  WIFE Admitted from facility:   Level of care:   Primary support name:   Primary support relationship to patient:  SPOUSE Degree of support available:   fair.  pt's wife has parkinson's dx and she cannot physically assist pt to any great extent.    CURRENT CONCERNS Current Concerns  Post-Acute Placement   Other Concerns:    SOCIAL WORK ASSESSMENT / PLAN Spoke with pt/family re: role of CSW/dcp.  pt has a fractured tailbone and he cannot walk/provide self-care. pt's wife has Parkinson's and is very limited in what she is able to do.  Pt has children who can provide intermittent care, but not 24 hour care.  pt is scheduled for surgery on Tuesday 10/24/12 at [facility]. he is agreeable to nhp at this time.  if pt does not meet criteria for 3 midnight stay, he is able to private pay for nh.  Pt would like to get snf rehab after his surgery and then return home.   Assessment/plan status:  Psychosocial Support/Ongoing Assessment of Needs Other assessment/ plan:   Information/referral to community resources:    PATIENT'S/FAMILY'S RESPONSE TO PLAN OF CARE: Pt very personable.  Anxious to move upstairs to a more comfortable bed.  Family/pt agreeable to NHP and appreciative of CSW intervention.

## 2012-10-17 NOTE — ED Notes (Signed)
Pt c/o tailbone pain, MD notified.

## 2012-10-17 NOTE — Telephone Encounter (Signed)
This is a very unique case.  This pt cannot get out of bed at all.  He cant even go to the bathroom.  Dr. Corliss Skains advised that he is willing to do a same day consult.  The pt will be coming by EMS.  I have spoke with Dr. Rosalyn Charters office in regards to his coumadin.  He will need to stop it on 10-19-12.   They are sending a fax.  I have spoke to the pt and his wife.  They both have a good understanding.   I advised Mrs. Schalow that the Emergency room is always open if they feel that is a needed option.

## 2012-10-17 NOTE — ED Notes (Signed)
Family at bedside. 

## 2012-10-17 NOTE — ED Notes (Signed)
Patient is resting comfortably. Asked for his magazine.

## 2012-10-17 NOTE — ED Notes (Signed)
Pt stated that he has not had a BM in 4 or 5 days. Pt stated that it hurts worse when he stands up. Stated that he has taken a combination of Lyrica and Hydrocodone and believes that's what is "stopping me up".

## 2012-10-17 NOTE — Telephone Encounter (Signed)
I will forward this note to Dr Riley Kill to review through [facility] (MD is currently out of town at a meeting).  If I do not get a response from Dr Riley Kill by Friday then I will contact him on 10/20/12 when I am back in the office.

## 2012-10-17 NOTE — ED Notes (Signed)
Patient is resting comfortably. 

## 2012-10-17 NOTE — Telephone Encounter (Signed)
Pt needs to have kyphoplasty due to compression fracture and they need him off coumadin for 5 days staring Friday. Pt can not get out of bed and they need to do this right away

## 2012-10-17 NOTE — ED Notes (Signed)
Pt arrived by EMS. Pt stated that he has been constipated x 5 days. Recently started on Hydrocodone with Lyrica and pt stated that is what caused him to be constipated. C/o abd pain that only hurts when he sits straight up.

## 2012-10-17 NOTE — ED Provider Notes (Signed)
History    78 year old male with constipation. Patient reports has not had a bowel movement approximately 5 days. Patient was recently started on narcotic pain medication for back pain. Patient apparently mentioned abdominal pain to triage nurse but he denies to me. He is complaining of lower back pain. No fevers or chills. No nausea or vomiting. Has not tried taking anything for his constipation. No blood in the stool. No melena.  No urinary complaints.  CSN: [id number]  Arrival date & time 10/17/12  Dwayne [name]   First MD Initiated Contact with Patient 10/17/12 1911      Chief Complaint  Patient presents with  . Constipation    (Consider location/radiation/quality/duration/timing/severity/associated sxs/prior treatment) HPI  Past Medical History  Diagnosis Date  . Arrhythmia   . Hyperlipidemia   . Bronchiectasis without acute exacerbation   . Disorders of diaphragm   . Plantar fascial fibromatosis   . Epistaxis   . Unspecified sinusitis (chronic)   . Allergic rhinitis, cause unspecified   . Spinal stenosis, unspecified region other than cervical     Past Surgical History  Procedure Date  . Coronary artery bypass graft   . Insert / replace / remove pacemaker     2007  . Kidney cyst removal   . Doppler echocardiography 2011    Family History  Problem Relation Age of Onset  . Cancer Neg Hx   . Heart disease Neg Hx   . Stroke Neg Hx     History  Substance Use Topics  . Smoking status: Never Smoker   . Smokeless tobacco: Not on file  . Alcohol Use: No      Review of Systems   Review of symptoms negative unless otherwise noted in HPI.  Allergies  Clindamycin; Doxycycline hyclate; Erythromycin; Metronidazole; Penicillins; Sulfonamide derivatives; and Tetracycline  Home Medications   Current Outpatient Rx  Name Route Sig Dispense Refill  . ALENDRONATE SODIUM 70 MG PO TABS Oral Take 70 mg by mouth every 7 (seven) days. On Thursdays; Take with a full glass of  water on an empty stomach.    . ASPIRIN 81 MG PO TABS Oral Take 81 mg by mouth daily.      [name] Kitchen CALCIUM-MAGNESIUM-VITAMIN D ER 600-40-500 MG-MG-UNIT PO TB24 Oral Take 1 tablet by mouth 2 (two) times daily. Taking 650 daily    . CYANOCOBALAMIN 1000 MCG/ML IJ SOLN Intramuscular Inject 1,000 mcg into the muscle every 21 ( twenty-one) days.     [name] Kitchen FEXOFENADINE HCL 180 MG PO TABS Oral Take 180 mg by mouth at bedtime.     [name] Kitchen FINASTERIDE 5 MG PO TABS Oral Take 5 mg by mouth at bedtime.    [name] Kitchen FLUTICASONE PROPIONATE 50 MCG/ACT NA SUSP Nasal Place 2 sprays into the nose daily. 16 g 11  . HYDROCODONE-ACETAMINOPHEN 5-325 MG PO TABS Oral Take 1 tablet by mouth Every 6 hours as needed. For pain    . METOPROLOL TARTRATE 25 MG PO TABS Oral Take 1 tablet (25 mg total) by mouth 2 (two) times daily. 180 tablet 6  . NITROSTAT 0.4 MG SL SUBL  place 1 tablet under the tongue every 5 minutes for UP TO 3 doses if needed for angina as directed b 100 tablet PRN  . OMEPRAZOLE 40 MG PO CPDR Oral Take 40 mg by mouth daily.      [name] Kitchen POLYETHYLENE GLYCOL 3350 PO PACK Oral Take 17 g by mouth daily.    [name] Kitchen PREGABALIN 75 MG PO CAPS Oral Take 75 mg  by mouth 3 (three) times daily.     [name] Kitchen PROLIA 60 MG/ML [place] SOLN Subcutaneous Inject 60 mg into the skin every 3 (three) months.    [name] Kitchen ROSUVASTATIN CALCIUM 10 MG PO TABS Oral Take 1 tablet (10 mg total) by mouth daily. 90 tablet 3  . TERAZOSIN HCL 5 MG PO CAPS Oral Take 1 capsule (5 mg total) by mouth daily. 90 capsule 1  . WARFARIN SODIUM 1 MG PO TABS Oral Take 1 mg by mouth daily. MWF-0.5 half tab (0.5 mg total); All other days-1 tab (1 mg total)    . ZOLPIDEM TARTRATE 10 MG PO TABS Oral Take 10 mg by mouth at bedtime as needed. For insomnia    . DOCUSATE SODIUM 100 MG PO CAPS Oral Take 1 capsule (100 mg total) by mouth 2 (two) times daily. 60 capsule 0  . POLYETHYLENE GLYCOL 3350 PO PACK Oral Take 17 g by mouth 2 (two) times daily. 14 each 0    BP 131/85  Pulse 71  Temp 98.3 F (36.8 C) (Oral)   SpO2 94%  Physical Exam  Nursing note and vitals reviewed. Constitutional: He appears well-developed and well-nourished. No distress.  HENT:  Head: Normocephalic and atraumatic.  Eyes: Conjunctivae normal are normal. Right eye exhibits no discharge. Left eye exhibits no discharge.  Neck: Neck supple.  Cardiovascular: Normal rate, regular rhythm and normal heart sounds.  Exam reveals no gallop and no friction rub.   No murmur heard. Pulmonary/Chest: Effort normal and breath sounds normal. No respiratory distress.  Abdominal: Soft. He exhibits no distension. There is no tenderness.  Genitourinary:       Distal rectal exam: No external lesions noted. Normal tone. Hard impacted stool in rectal vault. Manually disimpacted. Stool brown in color. Hemoccult negative.  Musculoskeletal: He exhibits no edema and no tenderness.  Neurological: He is alert.  Skin: Skin is warm and dry.  Psychiatric: He has a normal mood and affect. His behavior is normal. Thought content normal.    ED Course  Procedures (including critical care time)  Labs Reviewed  CBC - Abnormal; Notable for the following:    WBC 14.1 (*)     Hemoglobin 12.7 (*)     HCT 38.1 (*)     RDW 15.6 (*)     All other components within normal limits  BASIC METABOLIC PANEL   No results found.   1. Constipation   2. Fecal impaction   3. Compression Fractures    MDM  80yM with constipation. Disimpacted. Likely 2/2 narcotic pain meds.   9:43 PM Wife and son now at bedside. Not at bedside initially because ED on lock down. They report that pt cannot walk. Had compression fxs and scheduled for kyphoplasty w/ IR next week. Lives at home with his wife who cannot take care of. Son has broken ankle and in cast. Pt did not relay this concerns and I actually planned to discharge him. Unfortunately his social situation is difficult though. Will discuss with social work but doubt will be able to place pt from ED. Discussed with social  work and hospitalist.        Raeford Razor, MD 10/22/12 [phone]

## 2012-10-18 ENCOUNTER — Telehealth (HOSPITAL_COMMUNITY): Payer: Self-pay

## 2012-10-18 ENCOUNTER — Ambulatory Visit: Payer: Medicare Other

## 2012-10-18 ENCOUNTER — Encounter (HOSPITAL_COMMUNITY): Payer: Self-pay

## 2012-10-18 ENCOUNTER — Encounter (HOSPITAL_COMMUNITY): Payer: Self-pay | Admitting: Internal Medicine

## 2012-10-18 DIAGNOSIS — K5900 Constipation, unspecified: Secondary | ICD-10-CM

## 2012-10-18 DIAGNOSIS — I4891 Unspecified atrial fibrillation: Secondary | ICD-10-CM

## 2012-10-18 DIAGNOSIS — T148XXA Other injury of unspecified body region, initial encounter: Secondary | ICD-10-CM | POA: Diagnosis not present

## 2012-10-18 DIAGNOSIS — S32012K Unstable burst fracture of first lumbar vertebra, subsequent encounter for fracture with nonunion: Secondary | ICD-10-CM

## 2012-10-18 DIAGNOSIS — I25810 Atherosclerosis of coronary artery bypass graft(s) without angina pectoris: Secondary | ICD-10-CM | POA: Diagnosis not present

## 2012-10-18 LAB — BASIC METABOLIC PANEL
BUN: 25 mg/dL — ABNORMAL HIGH (ref 6–23)
CO2: 23 mEq/L (ref 19–32)
GFR calc non Af Amer: 88 mL/min — ABNORMAL LOW (ref >90)
Glucose, Bld: 95 mg/dL (ref 70–99)
Potassium: 3.7 mEq/L (ref 3.5–5.1)
Sodium: 134 mEq/L — ABNORMAL LOW (ref 135–145)

## 2012-10-18 LAB — CBC
HCT: 37.4 % — ABNORMAL LOW (ref 39.0–52.0)
Hemoglobin: 12.3 g/dL — ABNORMAL LOW (ref 13.0–17.0)
MCH: 29.4 pg (ref 26.0–34.0)
MCHC: 32.9 g/dL (ref 30.0–36.0)
MCV: 89.3 fL (ref 78.0–100.0)
RBC: 4.19 MIL/uL — ABNORMAL LOW (ref 4.22–5.81)

## 2012-10-18 LAB — PROTIME-INR: Prothrombin Time: 30 seconds — ABNORMAL HIGH (ref 11.6–15.2)

## 2012-10-18 MED ORDER — HEPARIN SODIUM (PORCINE) 5000 UNIT/ML IJ SOLN: 5000.0000 [IU] | Freq: Three times a day (TID) | INTRAMUSCULAR | Status: DC

## 2012-10-18 MED ORDER — PREGABALIN 75 MG PO CAPS
75.0000 mg | ORAL_CAPSULE | Freq: Three times a day (TID) | ORAL | Status: DC
  Administered 2012-10-18 – 2012-10-27 (×28): 75 mg via ORAL
  Filled 2012-10-18 (×29): qty 1

## 2012-10-18 MED ORDER — TERAZOSIN HCL 5 MG PO CAPS
5.0000 mg | ORAL_CAPSULE | Freq: Every day | ORAL | Status: DC
  Administered 2012-10-18 – 2012-10-27 (×10): 5 mg via ORAL
  Filled 2012-10-18 (×10): qty 1

## 2012-10-18 MED ORDER — CYANOCOBALAMIN 1000 MCG/ML IJ SOLN
1000.0000 ug | Freq: Once | INTRAMUSCULAR | Status: DC
  Filled 2012-10-18: qty 1

## 2012-10-18 MED ORDER — METOPROLOL TARTRATE 25 MG PO TABS
25.0000 mg | ORAL_TABLET | Freq: Two times a day (BID) | ORAL | Status: DC
  Administered 2012-10-18 – 2012-10-27 (×20): 25 mg via ORAL
  Filled 2012-10-18 (×21): qty 1

## 2012-10-18 MED ORDER — LORATADINE 10 MG PO TABS
10.0000 mg | ORAL_TABLET | Freq: Every day | ORAL | Status: DC
  Administered 2012-10-18 – 2012-10-27 (×10): 10 mg via ORAL
  Filled 2012-10-18 (×10): qty 1

## 2012-10-18 MED ORDER — ASPIRIN 81 MG PO TABS: 81.0000 mg | ORAL_TABLET | Freq: Every day | ORAL | Status: DC

## 2012-10-18 MED ORDER — ACETAMINOPHEN 650 MG RE SUPP: 650.0000 mg | Freq: Four times a day (QID) | RECTAL | Status: DC | PRN

## 2012-10-18 MED ORDER — ZOLPIDEM TARTRATE 5 MG PO TABS
5.0000 mg | ORAL_TABLET | Freq: Every evening | ORAL | Status: DC | PRN
  Administered 2012-10-18 – 2012-10-26 (×10): 5 mg via ORAL
  Filled 2012-10-18 (×9): qty 1

## 2012-10-18 MED ORDER — ZOLPIDEM TARTRATE 5 MG PO TABS: 10.0000 mg | ORAL_TABLET | Freq: Every evening | ORAL | Status: DC | PRN

## 2012-10-18 MED ORDER — ONDANSETRON HCL 4 MG/2ML IJ SOLN: 4.0000 mg | Freq: Four times a day (QID) | INTRAMUSCULAR | Status: DC | PRN

## 2012-10-18 MED ORDER — HYDROCODONE-ACETAMINOPHEN 5-325 MG PO TABS
1.0000 | ORAL_TABLET | Freq: Four times a day (QID) | ORAL | Status: DC | PRN
  Administered 2012-10-18 – 2012-10-27 (×2): 1 via ORAL
  Filled 2012-10-18 (×3): qty 1

## 2012-10-18 MED ORDER — ONDANSETRON HCL 4 MG/2ML IJ SOLN: 4.0000 mg | Freq: Three times a day (TID) | INTRAMUSCULAR | Status: DC | PRN

## 2012-10-18 MED ORDER — POLYETHYLENE GLYCOL 3350 17 G PO PACK
17.0000 g | PACK | Freq: Every day | ORAL | Status: DC
  Administered 2012-10-18 – 2012-10-27 (×10): 17 g via ORAL
  Filled 2012-10-18 (×10): qty 1

## 2012-10-18 MED ORDER — FINASTERIDE 5 MG PO TABS: 5.0000 mg | ORAL_TABLET | Freq: Every day | ORAL | Status: DC

## 2012-10-18 MED ORDER — SODIUM CHLORIDE 0.9 % IJ SOLN
3.0000 mL | Freq: Two times a day (BID) | INTRAMUSCULAR | Status: DC
  Administered 2012-10-18 – 2012-10-27 (×17): 3 mL via INTRAVENOUS

## 2012-10-18 MED ORDER — MORPHINE SULFATE 2 MG/ML IJ SOLN: 1.0000 mg | INTRAMUSCULAR | Status: DC | PRN

## 2012-10-18 MED ORDER — DOCUSATE SODIUM 100 MG PO CAPS
100.0000 mg | ORAL_CAPSULE | Freq: Two times a day (BID) | ORAL | Status: DC
  Administered 2012-10-18 – 2012-10-27 (×20): 100 mg via ORAL
  Filled 2012-10-18 (×23): qty 1

## 2012-10-18 MED ORDER — NITROGLYCERIN 0.4 MG SL SUBL: 0.4000 mg | SUBLINGUAL_TABLET | SUBLINGUAL | Status: DC | PRN

## 2012-10-18 MED ORDER — PANTOPRAZOLE SODIUM 40 MG PO TBEC
40.0000 mg | DELAYED_RELEASE_TABLET | Freq: Every day | ORAL | Status: DC
  Administered 2012-10-18 – 2012-10-27 (×10): 40 mg via ORAL
  Filled 2012-10-18 (×10): qty 1

## 2012-10-18 MED ORDER — ONDANSETRON HCL 4 MG PO TABS: 4.0000 mg | ORAL_TABLET | Freq: Four times a day (QID) | ORAL | Status: DC | PRN

## 2012-10-18 MED ORDER — ASPIRIN 81 MG PO CHEW
81.0000 mg | CHEWABLE_TABLET | Freq: Every day | ORAL | Status: DC
  Administered 2012-10-18 – 2012-10-27 (×9): 81 mg via ORAL
  Filled 2012-10-18 (×9): qty 1

## 2012-10-18 MED ORDER — WARFARIN - PHARMACIST DOSING INPATIENT: Freq: Every day | Status: DC

## 2012-10-18 MED ORDER — ATORVASTATIN CALCIUM 20 MG PO TABS
20.0000 mg | ORAL_TABLET | Freq: Every day | ORAL | Status: DC
  Administered 2012-10-18 – 2012-10-26 (×9): 20 mg via ORAL
  Filled 2012-10-18 (×10): qty 1

## 2012-10-18 MED ORDER — HYDROMORPHONE HCL PF 1 MG/ML IJ SOLN: 1.0000 mg | INTRAMUSCULAR | Status: AC | PRN

## 2012-10-18 MED ORDER — FLUTICASONE PROPIONATE 50 MCG/ACT NA SUSP
2.0000 | Freq: Every day | NASAL | Status: DC
  Administered 2012-10-18 – 2012-10-27 (×10): 2 via NASAL
  Filled 2012-10-18: qty 16

## 2012-10-18 MED ORDER — FINASTERIDE 5 MG PO TABS
5.0000 mg | ORAL_TABLET | Freq: Every day | ORAL | Status: DC
  Administered 2012-10-18 – 2012-10-26 (×10): 5 mg via ORAL
  Filled 2012-10-18 (×11): qty 1

## 2012-10-18 MED ORDER — ACETAMINOPHEN 325 MG PO TABS
650.0000 mg | ORAL_TABLET | Freq: Four times a day (QID) | ORAL | Status: DC | PRN
  Administered 2012-10-22 – 2012-10-27 (×5): 650 mg via ORAL
  Filled 2012-10-18 (×5): qty 2

## 2012-10-18 NOTE — Progress Notes (Signed)
ANTICOAGULATION CONSULT NOTE - Follow Up Consult  Pharmacy Consult for Warfarin Indication: atrial fibrillation  Labs:  Basename 10/18/12 0545 10/17/12 2229 10/17/12 2056  HGB 12.3* -- 12.7*  HCT 37.4* -- 38.1*  PLT 368 -- 378  APTT -- -- --  LABPROT 30.0* 29.9* --  INR 3.06* 3.05* --  HEPARINUNFRC -- -- --  CREATININE 0.71 -- 0.77  CKTOTAL -- -- --  CKMB -- -- --  TROPONINI -- -- --    Assessment: 78 y.o. M on warfarin for hx Afib PTA. The patient was noted to have a recent L4 compression fracture 2 weeks ago and was scheduled to start holding warfarin on 10/31 and have vertebroplasty with Dr. Corliss Skains on 11/5. INR slightly SUPRAtherapeutic today -- last dose was on 10/28. Will go ahead and begin holding for the planned upcoming procedure  Goal of Therapy:  INR 2-3   Plan:  1. Hold warfarin for upcoming surgery on 11/5 2. Consider adding an agent for bridging when INR <2 3. Will sign off of warfarin dosing protocol for now and follow peripherally for restart plans -- please re-consult [place] if warfarin is wished to be resumed 4. Will continue to monitor for any signs/symptoms of bleeding  Georgina Pillion, PharmD, BCPS Clinical Pharmacist Pager: [phone] 10/18/2012 10:29 AM

## 2012-10-18 NOTE — Consult Note (Signed)
CARDIOLOGY CONSULT NOTE  Patient ID: Dwayne [name], MRN: [id number], DOB/AGE: 76-20-1933 76 y.o. Admit date: 10/17/2012 Date of Consult: 10/18/2012  Primary Physician: Sanda Linger, MD Primary Cardiologist: Dr. Riley Kill  Reason for Consultation: Management of anticoagulation    HPI: 78 y.o. w/ PMHx significant for CAD (s/p CABG '93, s/p PCI of SVG to OM '03), A.fib w/ tachybrady syndrome (on coumadin, s/p Medtronic PPM '07), HLD, and HTN who presented to [facility] on 10/17/12 with complaints of back pain and constipation. He recently suffered a L4 compression fracture and was placed on oral analgesics with plans for kyphoplasty on 11/5, but due to no BM x5 days and increased low back pain he presented to the ED yesterday. He is on chronic coumadin for atrial fibrillation. Plans are for surgical repair when INR 1.5 or below. Cardiology is asked to assist in management of anticoagulation.  He is usually very active and participates in cardiac rehab three times a week without complaints of chest pain or sob. No recent illness, fever, chills, sob, orthopnea, edema, chest pain, or syncope. He has been compliant with all meds including Coumadin. No bleeding problems. About a week ago he was doing "heavy yard work" and felt a pull in his back. Lumbar CT showed L4 compression fracture for which he was to have surgical repair on 11/5.  EKG is pending. Labs are significant for WBC 14.1, Hgb 12.7, BUN/Crt 26/0.77, INR 3.05. He is afebrile and hemodynamically stable.    Past Medical History  Diagnosis Date  . Atrial fibrillation     on chronic coumadin  . Hyperlipidemia   . Bronchiectasis without acute exacerbation   . Disorders of diaphragm   . Plantar fascial fibromatosis   . Epistaxis   . Unspecified sinusitis (chronic)   . Allergic rhinitis, cause unspecified   . Spinal stenosis, unspecified region other than cervical   . Hypertension   . Coronary artery disease     s/p CABG 1993,  s/p PCI of SVG to OM '03  . Tachy-brady syndrome     s/p Medtronic PPM '07  . Myocardial infarction   . Pneumonia     " multiple times "  . Shortness of breath   . GERD (gastroesophageal reflux disease)   . Arthritis      2011 - Echo Study Conclusions:  - Left ventricle: The cavity size was normal. Wall thickness was increased in a pattern of mild LVH. Systolic function was normal. The estimated ejection fraction was in the range of 55% to 60%. Images were inadequate for LV wall motion assessment. The patient was in atrial fibrillation, diastolic function was indeterminant.  - Aortic valve: There was no stenosis. - Mitral valve: Mild to moderate regurgitation. - Left atrium: The atrium was moderately dilated. - Right ventricle: The cavity size was normal. Pacer wire or catheter noted in right ventricle. Systolic function was normal. - Pulmonary arteries: No complete TR doppler jet was measured so unable to estimate PA systolic pressure. - Inferior vena cava: The vessel was normal in size; the respirophasic diameter changes were in the normal range (= 50%); findings are consistent with normal central venous pressure.  Impressions:  Technically difficult study with poor acoustic windows. The patient was in atrial fibrillation. Normal LV size with mild LV hypertrophy, EF 55-60%. Images were inadequate for wall motion analysis. Mild to moderate mitral regurgitation. Moderate left  atrial enlargement. Normal RV size and systolic function.   Surgical History:  Past Surgical History  Procedure Date  . Coronary artery bypass graft     LIMA to LAD, SVG to OM, SVG to left circumflex, SVG to PD/PLSA  . Pacemaker insertion     2007, Medtronic dual-chamber  . Kidney cyst removal   . Doppler echocardiography 2011  . Cholecystectomy      Home Meds: Medication Sig  alendronate (FOSAMAX) 70 MG tablet Take 70 mg by mouth every 7 (seven) days. On Thursdays; Take with a full glass of water on an  empty stomach.  aspirin 81 MG tablet Take 81 mg by mouth daily.    Calcium-Magnesium-Vitamin D (CITRACAL CALCIUM+D) 600-40-500 MG-MG-UNIT TB24 Take 1 tablet by mouth 2 (two) times daily. Taking 650 daily  cyanocobalamin (,VITAMIN B-12,) 1000 MCG/ML injection Inject 1,000 mcg into the muscle every 21 ( twenty-one) days.   fexofenadine (ALLEGRA) 180 MG tablet Take 180 mg by mouth at bedtime.   finasteride (PROSCAR) 5 MG tablet Take 5 mg by mouth at bedtime.  fluticasone (FLONASE) 50 MCG/ACT nasal spray Place 2 sprays into the nose daily.  HYDROcodone-acetaminophen (NORCO/VICODIN) 5-325 MG per tablet Take 1 tablet by mouth Every 6 hours as needed. For pain  metoprolol tartrate (LOPRESSOR) 25 MG tablet Take 1 tablet (25 mg total) by mouth 2 (two) times daily.  NITROSTAT 0.4 MG SL tablet place 1 tablet under the tongue every 5 minutes for UP TO 3 doses if needed for angina as directed b  omeprazole (PRILOSEC) 40 MG capsule Take 40 mg by mouth daily.    polyethylene glycol (MIRALAX / GLYCOLAX) packet Take 17 g by mouth daily.  pregabalin (LYRICA) 75 MG capsule Take 75 mg by mouth 3 (three) times daily.   PROLIA 60 MG/ML SOLN injection Inject 60 mg into the skin every 3 (three) months.  rosuvastatin (CRESTOR) 10 MG tablet Take 1 tablet (10 mg total) by mouth daily.  terazosin (HYTRIN) 5 MG capsule Take 1 capsule (5 mg total) by mouth daily.  warfarin (COUMADIN) 1 MG tablet Take 1 mg by mouth daily. MWF-0.5 half tab (0.5 mg total); All other days-1 tab (1 mg total)  zolpidem (AMBIEN) 10 MG tablet Take 10 mg by mouth at bedtime as needed. For insomnia  docusate sodium (COLACE) 100 MG capsule Take 1 capsule (100 mg total) by mouth 2 (two) times daily.  polyethylene glycol (MIRALAX / GLYCOLAX) packet Take 17 g by mouth 2 (two) times daily.    Inpatient Medications:   . aspirin  81 mg Oral Daily  . atorvastatin  20 mg Oral q1800  . cyanocobalamin  1,000 mcg Intramuscular Once  . docusate sodium  100 mg  Oral BID  . finasteride  5 mg Oral QHS  . fluticasone  2 spray Each Nare Daily  . loratadine  10 mg Oral Daily  . metoprolol tartrate  25 mg Oral BID  .  morphine injection  4 mg Intravenous Once  . pantoprazole  40 mg Oral Daily  . polyethylene glycol  17 g Oral Daily  . pregabalin  75 mg Oral TID  . sodium chloride  3 mL Intravenous Q12H  . terazosin  5 mg Oral Daily  . Warfarin - Pharmacist Dosing Inpatient   Does not apply q1800    Allergies:  Allergies  Allergen Reactions  . Clindamycin     REACTION: upset stomach  . Doxycycline Hyclate     REACTION: n/v/d  . Erythromycin     REACTION: n/v/d  . Metronidazole     REACTION: rash, itching  .  Penicillins     REACTION: hives  . Sulfonamide Derivatives     REACTION: hives  . Tetracycline     REACTION: n/v/d    History   Social History  . Marital Status: Married    Spouse Name: N/A    Number of Children: 4  . Years of Education: N/A   Occupational History  .     Social History Main Topics  . Smoking status: Never Smoker   . Smokeless tobacco: Never Used  . Alcohol Use: No  . Drug Use: No  . Sexually Active: No   Other Topics Concern  . Not on file   Social History Narrative  . No narrative on file     Family History  Problem Relation Age of Onset  . Cancer Neg Hx   . Heart disease Neg Hx   . Stroke Neg Hx      Review of Systems: General: negative for chills, fever, night sweats or weight changes.  Cardiovascular: negative for chest pain, shortness of breath dyspnea on exertion, edema, orthopnea, palpitations, or paroxysmal nocturnal dyspnea Dermatological: negative for rash Respiratory: negative for cough or wheezing Urologic: negative for hematuria Abdominal: (+) constipation; negative for nausea, vomiting, diarrhea, bright red blood per rectum, melena, or hematemesis Neurologic: negative for visual changes, syncope, or dizziness Musculoskeletal: (+) back pain All other systems reviewed and are  otherwise negative except as noted above.  Labs:  Component Value Date   WBC 12.6* 10/18/2012   HGB 12.3* 10/18/2012   HCT 37.4* 10/18/2012   MCV 89.3 10/18/2012   PLT 368 10/18/2012    Lab 10/18/12 0545  NA 134*  K 3.7  CL 102  CO2 23  BUN 25*  CREATININE 0.71  CALCIUM 9.0  GLUCOSE 95     10/18/2012 05:45  Prothrombin Time 30.0 (H)  INR 3.06 (H)    Radiology/Studies:  None   EKG: Pending Telemetry: Not on tele  Physical Exam: Blood pressure 131/80, pulse 70, temperature 97.8 F (36.6 C), temperature source Oral, resp. rate 18, weight 161 lb 1.6 oz (73.074 kg), SpO2 94.00%. General: Well developed, elderly white n no acute distress. Head: Normocephalic, atraumatic, sclera non-icteric, no xanthomas, nares are without discharge.  Neck: Supple. Negative for carotid bruits or JVD. Lungs: Clear bilaterally to auscultation without wheezes, rales, or rhonchi. Breathing is unlabored. Heart: RRR with S1 S2. No murmurs, rubs, or gallops appreciated. Abdomen: Soft, non-tender, non-distended with normoactive bowel sounds. No rebound/guarding. No obvious abdominal masses. Msk:  Strength and tone appear normal for age. Extremities: No clubbing or cyanosis. No edema.  Distal pedal pulses are intact and equal bilaterally. Neuro: Alert and oriented X 3. Moves all extremities spontaneously. Psych:  Responds to questions appropriately with a normal affect.   Assessment and Plan:  78 y.o. w/ PMHx significant for CAD (s/p CABG '93, s/p PCI of SVG to OM '03), A.fib w/ tachybrady syndrome (on coumadin, s/p Medtronic PPM '07), HLD, and HTN who presented to [facility] on 10/17/12 with complaints of back pain and constipation.   1. L4 compression fracture: Plans for surgical repair once INR < 1.5. Further management per primary team 2. Chronic Atrial fibrillation on coumadin: INR 3. No history of stroke. EF normal. Ok to hold coumadin for surgery. No need for heparin bridge. Will  put on VTE prophylaxis subq heparin. Resume coumadin post op when ok with surgery. Cont BB. 3. CAD: Stable. Very active without anginal symptoms. Cont ASA, BB, statin. 4. Hypertension:  BP stable 5. Hyperlipidemia: Cont statin 6. H/o tachybrady syndrome s/p PPM: Stable.   Signed, HOPE, JESSICA PA-C 10/18/2012, 11:05 AM  Patient seen, examined. Available data reviewed. Agree with findings, assessment, and plan as outlined by [facility], PA-C. The patient was independently interviewed and examined. His exam is pertinent for a pleasant, elderly gentleman in no distress. His cardiac exam shows an irregular rhythm without murmur or gallop. There is no edema present. The patient is chronically anticoagulated for atrial fibrillation. He has no history of LV dysfunction, diabetes, or stroke/TIA. He is at acceptable risk to hold warfarin until his INR is less than 1.5 so that he can proceed with surgery. From a perspective of cardiac risk with surgery, he is physically active without anginal symptoms. He has undergone remote revascularization surgery and stenting. He clearly is able to achieve greater than 4 metabolic equivalents without cardiopulmonary symptoms and I think he is appropriate to proceed with surgery without further cardiac testing.  Tonny Bollman, M.D. 10/18/2012 12:43 PM

## 2012-10-18 NOTE — Telephone Encounter (Signed)
This should be ok.  He can come off for the procedure.

## 2012-10-18 NOTE — H&P (Signed)
Chief Complaint: Back pain Referring Physician:Viyouh HPI: Dwayne [name] is an 78 y.o. male. With significant back who has been worked up and found to have a significant L4 fracture with 50% loss of vertebral body height. He was referred and scheduled for consult/intervention on 11/5. However, his pain has been so severe, that he has now been admitted for pain control. He has also developed constipation related to use of pain medication.  PMHx reviewed noted significant for CAD and afib, for which he is on chronic Coumadin. This was to be held for procedure. We are asked to see this gentleman as an inpt and hopefully expedite his treatment of this painful L4 fracture. He is moving his bowels and denies other c/o. No recent fevers, dysuria, cough, SOB.  Past Medical History:  Past Medical History  Diagnosis Date  . Atrial fibrillation     on chronic coumadin  . Hyperlipidemia   . Bronchiectasis without acute exacerbation   . Disorders of diaphragm   . Plantar fascial fibromatosis   . Epistaxis   . Unspecified sinusitis (chronic)   . Allergic rhinitis, cause unspecified   . Spinal stenosis, unspecified region other than cervical   . Hypertension   . Coronary artery disease     s/p CABG 1993, s/p PCI of SVG to OM '03  . Tachy-brady syndrome     s/p Medtronic PPM '07  . Myocardial infarction   . Pneumonia     " multiple times "  . Shortness of breath   . GERD (gastroesophageal reflux disease)   . Arthritis     Past Surgical History:  Past Surgical History  Procedure Date  . Coronary artery bypass graft     LIMA to LAD, SVG to OM, SVG to left circumflex, SVG to PD/PLSA  . Pacemaker insertion     2007, Medtronic dual-chamber  . Kidney cyst removal   . Doppler echocardiography 2011  . Cholecystectomy     Family History:  Family History  Problem Relation Age of Onset  . Cancer Neg Hx   . Heart disease Neg Hx   . Stroke Neg Hx     Social History:  reports that he has never  smoked. He has never used smokeless tobacco. He reports that he does not drink alcohol or use illicit drugs.  Allergies:  Allergies  Allergen Reactions  . Clindamycin     REACTION: upset stomach  . Doxycycline Hyclate     REACTION: n/v/d  . Erythromycin     REACTION: n/v/d  . Metronidazole     REACTION: rash, itching  . Penicillins     REACTION: hives  . Sulfonamide Derivatives     REACTION: hives  . Tetracycline     REACTION: n/v/d    Medications: Medications Prior to Admission  Medication Sig Dispense Refill  . alendronate (FOSAMAX) 70 MG tablet Take 70 mg by mouth every 7 (seven) days. On Thursdays; Take with a full glass of water on an empty stomach.      [name] Kitchen aspirin 81 MG tablet Take 81 mg by mouth daily.        . Calcium-Magnesium-Vitamin D (CITRACAL CALCIUM+D) 600-40-500 MG-MG-UNIT TB24 Take 1 tablet by mouth 2 (two) times daily. Taking 650 daily      . cyanocobalamin (,VITAMIN B-12,) 1000 MCG/ML injection Inject 1,000 mcg into the muscle every 21 ( twenty-one) days.       . fexofenadine (ALLEGRA) 180 MG tablet Take 180 mg by mouth at bedtime.       [name] Kitchen  finasteride (PROSCAR) 5 MG tablet Take 5 mg by mouth at bedtime.      . fluticasone (FLONASE) 50 MCG/ACT nasal spray Place 2 sprays into the nose daily.  16 g  11  . HYDROcodone-acetaminophen (NORCO/VICODIN) 5-325 MG per tablet Take 1 tablet by mouth Every 6 hours as needed. For pain      . metoprolol tartrate (LOPRESSOR) 25 MG tablet Take 1 tablet (25 mg total) by mouth 2 (two) times daily.  180 tablet  6  . NITROSTAT 0.4 MG SL tablet place 1 tablet under the tongue every 5 minutes for UP TO 3 doses if needed for angina as directed b  100 tablet  PRN  . omeprazole (PRILOSEC) 40 MG capsule Take 40 mg by mouth daily.        . polyethylene glycol (MIRALAX / GLYCOLAX) packet Take 17 g by mouth daily.      . pregabalin (LYRICA) 75 MG capsule Take 75 mg by mouth 3 (three) times daily.       [name] Kitchen PROLIA 60 MG/ML SOLN injection Inject 60  mg into the skin every 3 (three) months.      . rosuvastatin (CRESTOR) 10 MG tablet Take 1 tablet (10 mg total) by mouth daily.  90 tablet  3  . terazosin (HYTRIN) 5 MG capsule Take 1 capsule (5 mg total) by mouth daily.  90 capsule  1  . warfarin (COUMADIN) 1 MG tablet Take 1 mg by mouth daily. MWF-0.5 half tab (0.5 mg total); All other days-1 tab (1 mg total)      . zolpidem (AMBIEN) 10 MG tablet Take 10 mg by mouth at bedtime as needed. For insomnia        Please HPI for pertinent positives, otherwise complete 10 system ROS negative.  Physical Exam: Blood pressure 131/80, pulse 70, temperature 97.8 F (36.6 C), temperature source Oral, resp. rate 18, weight 161 lb 1.6 oz (73.074 kg), SpO2 94.00%. There is no height on file to calculate BMI.   General Appearance:  Alert, cooperative, no distress, appears stated age  Head:  Normocephalic, without obvious abnormality, atraumatic  ENT: Unremarkable  Neck: Supple, symmetrical, trachea midline, no adenopathy, thyroid: not enlarged, symmetric, no tenderness/mass/nodules  Lungs:   Clear to auscultation bilaterally, no w/r/r, respirations unlabored without use of accessory muscles.  Heart:  Regular rate and rhythm, S1, S2 normal, no murmur, rub or gallop.  Back: Tender lower lumbar region, no palpable defects.  Abdomen:   Soft, non-tender, non distended. Bowel sounds active all four quadrants,  no masses, no organomegaly.  Extremities: Extremities normal, atraumatic, no cyanosis or edema  Neurologic: Normal affect, no gross deficits.   Results for orders placed during the hospital encounter of 10/17/12 (from the past 48 hour(s))  CBC     Status: Abnormal   Collection Time   10/18/12  5:45 AM      Component Value Range Comment   WBC 12.6 (*) 4.0 - 10.5 K/uL    RBC 4.19 (*) 4.22 - 5.81 MIL/uL    Hemoglobin 12.3 (*) 13.0 - 17.0 g/dL    HCT 30.8 (*) 65.7 - 52.0 %    MCV 89.3  78.0 - 100.0 fL    MCH 29.4  26.0 - 34.0 pg    MCHC 32.9  30.0 -  36.0 g/dL    RDW 84.6 (*) 96.2 - 15.5 %    Platelets 368  150 - 400 K/uL   PROTIME-INR     Status: Abnormal  Collection Time   10/18/12  5:45 AM      Component Value Range Comment   Prothrombin Time 30.0 (*) 11.6 - 15.2 seconds    INR 3.06 (*) 0.00 - 1.49    BMET    Component Value Date/Time   NA 134* 10/18/2012 0545   K 3.7 10/18/2012 0545   CL 102 10/18/2012 0545   CO2 23 10/18/2012 0545   GLUCOSE 95 10/18/2012 0545   BUN 25* 10/18/2012 0545   CREATININE 0.71 10/18/2012 0545   CALCIUM 9.0 10/18/2012 0545   GFRNONAA 88* 10/18/2012 0545   GFRAA >90 10/18/2012 0545     No results found.  Assessment/Plan Symptomatic L4 compression fracture. Dr. Corliss Skains feels this is amenable to augmentation procedure. Chronic anticoagulation Appreciate cardiology input to allow Coumadin to be held. Will watch INR daily and Dr. Corliss Skains will proceed when INR at or below 1.5 Afebrile and mildly elevated WBC trending down with no clear source of infection. Discussed procedure in detail with pt and wife, including risks, complications, and expected recovery. Consent obtained.  Brayton El PA-C 10/18/2012, 3:44 PM

## 2012-10-18 NOTE — Telephone Encounter (Signed)
The precert is fine.  It is ok to proceed

## 2012-10-18 NOTE — Progress Notes (Signed)
Aware of patient's admission. He was on schedule for 11/5 for planned consult and vertebroplasty with Dr. Corliss Skains. Per notes, he was to stop Coumadin on 10/31 INR currently 3.06 Coumadin will need to be held and INR to normalize before we can safely proceed. Will see pt to discuss treatment and plan for intervention when INR at 1.5 or below.  Brayton El PA-C 10/18/2012 9:55 AM

## 2012-10-18 NOTE — H&P (Signed)
Dwayne [name] is an 78 y.o. male.  Patient was seen and examined on October 18, 2012. PCP - Dr. Sanda Linger. Cardiologist - Dr. Riley Kill.  Chief Complaint: Constipation and low back pain causing difficulty to walk. HPI: 78 year-old male with history of CAD status post CABG, atrial fibrillation status post pacemaker placement and on Coumadin, BPH, hypertension who has had a recent L4 compression fracture 2 weeks ago and was placed on pain relief medication and is scheduled to have kyphoplasty done next week by Dr. Corliss Skains, interventional neuroradiologist. Patient has been having difficulty moving bowels for last 5 days and had come to the ER where patient had fecal disimpaction. Patient after which was found to have difficulty walking because of low back pain has been admitted for further management. Denies any chest pain shortness of breath nausea vomiting fever chills.  Past Medical History  Diagnosis Date  . Arrhythmia   . Hyperlipidemia   . Bronchiectasis without acute exacerbation   . Disorders of diaphragm   . Plantar fascial fibromatosis   . Epistaxis   . Unspecified sinusitis (chronic)   . Allergic rhinitis, cause unspecified   . Spinal stenosis, unspecified region other than cervical     Past Surgical History  Procedure Date  . Coronary artery bypass graft   . Insert / replace / remove pacemaker     2007  . Kidney cyst removal   . Doppler echocardiography 2011    Family History  Problem Relation Age of Onset  . Cancer Neg Hx   . Heart disease Neg Hx   . Stroke Neg Hx    Social History:  reports that he has never smoked. He does not have any smokeless tobacco history on file. He reports that he does not drink alcohol or use illicit drugs.  Allergies:  Allergies  Allergen Reactions  . Clindamycin     REACTION: upset stomach  . Doxycycline Hyclate     REACTION: n/v/d  . Erythromycin     REACTION: n/v/d  . Metronidazole     REACTION: rash, itching  . Penicillins       REACTION: hives  . Sulfonamide Derivatives     REACTION: hives  . Tetracycline     REACTION: n/v/d    Medications Prior to Admission  Medication Sig Dispense Refill  . alendronate (FOSAMAX) 70 MG tablet Take 70 mg by mouth every 7 (seven) days. On Thursdays; Take with a full glass of water on an empty stomach.      [name] Kitchen aspirin 81 MG tablet Take 81 mg by mouth daily.        . Calcium-Magnesium-Vitamin D (CITRACAL CALCIUM+D) 600-40-500 MG-MG-UNIT TB24 Take 1 tablet by mouth 2 (two) times daily. Taking 650 daily      . cyanocobalamin (,VITAMIN B-12,) 1000 MCG/ML injection Inject 1,000 mcg into the muscle every 21 ( twenty-one) days.       . fexofenadine (ALLEGRA) 180 MG tablet Take 180 mg by mouth at bedtime.       . finasteride (PROSCAR) 5 MG tablet Take 5 mg by mouth at bedtime.      . fluticasone (FLONASE) 50 MCG/ACT nasal spray Place 2 sprays into the nose daily.  16 g  11  . HYDROcodone-acetaminophen (NORCO/VICODIN) 5-325 MG per tablet Take 1 tablet by mouth Every 6 hours as needed. For pain      . metoprolol tartrate (LOPRESSOR) 25 MG tablet Take 1 tablet (25 mg total) by mouth 2 (two) times daily.  180 tablet  6  . NITROSTAT 0.4 MG SL tablet place 1 tablet under the tongue every 5 minutes for UP TO 3 doses if needed for angina as directed b  100 tablet  PRN  . omeprazole (PRILOSEC) 40 MG capsule Take 40 mg by mouth daily.        . polyethylene glycol (MIRALAX / GLYCOLAX) packet Take 17 g by mouth daily.      . pregabalin (LYRICA) 75 MG capsule Take 75 mg by mouth 3 (three) times daily.       [name] Kitchen PROLIA 60 MG/ML SOLN injection Inject 60 mg into the skin every 3 (three) months.      . rosuvastatin (CRESTOR) 10 MG tablet Take 1 tablet (10 mg total) by mouth daily.  90 tablet  3  . terazosin (HYTRIN) 5 MG capsule Take 1 capsule (5 mg total) by mouth daily.  90 capsule  1  . warfarin (COUMADIN) 1 MG tablet Take 1 mg by mouth daily. MWF-0.5 half tab (0.5 mg total); All other days-1 tab (1 mg  total)      . zolpidem (AMBIEN) 10 MG tablet Take 10 mg by mouth at bedtime as needed. For insomnia        Results for orders placed during the hospital encounter of 10/17/12 (from the past 48 hour(s))  CBC     Status: Abnormal   Collection Time   10/17/12  8:56 PM      Component Value Range Comment   WBC 14.1 (*) 4.0 - 10.5 K/uL    RBC 4.26  4.22 - 5.81 MIL/uL    Hemoglobin 12.7 (*) 13.0 - 17.0 g/dL    HCT 16.1 (*) 09.6 - 52.0 %    MCV 89.4  78.0 - 100.0 fL    MCH 29.8  26.0 - 34.0 pg    MCHC 33.3  30.0 - 36.0 g/dL    RDW 04.5 (*) 40.9 - 15.5 %    Platelets 378  150 - 400 K/uL   BASIC METABOLIC PANEL     Status: Abnormal   Collection Time   10/17/12  8:56 PM      Component Value Range Comment   Sodium 138  135 - 145 mEq/L    Potassium 3.8  3.5 - 5.1 mEq/L    Chloride 103  96 - 112 mEq/L    CO2 24  19 - 32 mEq/L    Glucose, Bld 112 (*) 70 - 99 mg/dL    BUN 26 (*) 6 - 23 mg/dL    Creatinine, Ser 8.11  0.50 - 1.35 mg/dL    Calcium 9.2  8.4 - 91.4 mg/dL    GFR calc non Af Amer 85 (*) >90 mL/min    GFR calc Af Amer >90  >90 mL/min   PROTIME-INR     Status: Abnormal   Collection Time   10/17/12 10:29 PM      Component Value Range Comment   Prothrombin Time 29.9 (*) 11.6 - 15.2 seconds    INR 3.05 (*) 0.00 - 1.49    No results found.  Review of Systems  Constitutional: Negative.   HENT: Negative.   Eyes: Negative.   Respiratory: Negative.   Cardiovascular: Negative.   Gastrointestinal: Positive for constipation.  Musculoskeletal: Positive for back pain.  Skin: Negative.   Neurological: Negative.   Endo/Heme/Allergies: Negative.   Psychiatric/Behavioral: Negative.     Blood pressure 117/74, pulse 70, temperature 98.3 F (36.8 C), temperature source Oral, resp. rate  21, SpO2 94.00%. Physical Exam  Constitutional: He is oriented to person, place, and time. He appears well-developed and well-nourished. No distress.  HENT:  Head: Normocephalic and atraumatic.  Right  Ear: External ear normal.  Left Ear: External ear normal.  Nose: Nose normal.  Mouth/Throat: Oropharynx is clear and moist. No oropharyngeal exudate.  Eyes: Conjunctivae normal are normal. Pupils are equal, round, and reactive to light. Right eye exhibits no discharge. Left eye exhibits no discharge. No scleral icterus.  Neck: Normal range of motion. Neck supple.  Cardiovascular: Normal rate.   Respiratory: Effort normal and breath sounds normal. No respiratory distress. He has no wheezes. He has no rales.  GI: Soft. Bowel sounds are normal. He exhibits no distension. There is no tenderness. There is no rebound and no guarding.  Musculoskeletal: He exhibits no edema and no tenderness.  Neurological: He is alert and oriented to person, place, and time.       Moves all extremities.  Skin: Skin is warm and dry. He is not diaphoretic.     Assessment/Plan #1. L4 compression fracture with difficulty walking and pain - at this time patient will be admitted with pain relief.. Consult Dr. Corliss Skains in a.m. for kyphoplasty. Patient at this time is on Coumadin but does not want to stop it until consulted by Dr. Riley Kill patient's cardiologist. #2. Constipation - possibly secondary to patient's recent starting of pain relief medications. I have placed patient on Colace. #3. Atrial fibrillation status post pacemaker placement and on Coumadin - please consult Dr. Riley Kill in a.m. Patient's Coumadin may have to be stopped and reversed. But patient wants Dr. Riley Kill to be consulted and as per his advice anything to be done. #4. CAD status post CABG - denies any chest pain. #5. BPH - continue present medications.  CODE STATUS - full code.  Calissa Swenor N. 10/18/2012, 12:45 AM

## 2012-10-18 NOTE — Progress Notes (Signed)
ANTICOAGULATION CONSULT NOTE - Initial Consult  Pharmacy Consult for coumadin Indication: atrial fibrillation  Allergies  Allergen Reactions  . Clindamycin     REACTION: upset stomach  . Doxycycline Hyclate     REACTION: n/v/d  . Erythromycin     REACTION: n/v/d  . Metronidazole     REACTION: rash, itching  . Penicillins     REACTION: hives  . Sulfonamide Derivatives     REACTION: hives  . Tetracycline     REACTION: n/v/d    Patient Measurements:   Heparin Dosing Weight:   Vital Signs: Temp: 98.3 F (36.8 C) (10/29 1908) Temp src: Oral (10/29 1908) BP: 117/74 mmHg (10/29 2345) Pulse Rate: 70  (10/29 2345)  Labs:  Alvira Philips 10/17/12 2229 10/17/12 2056  HGB -- 12.7*  HCT -- 38.1*  PLT -- 378  APTT -- --  LABPROT 29.9* --  INR 3.05* --  HEPARINUNFRC -- --  CREATININE -- 0.77  CKTOTAL -- --  CKMB -- --  TROPONINI -- --    The CrCl is unknown because both a height and weight (above a minimum accepted value) are required for this calculation.   Medical History: Past Medical History  Diagnosis Date  . Arrhythmia   . Hyperlipidemia   . Bronchiectasis without acute exacerbation   . Disorders of diaphragm   . Plantar fascial fibromatosis   . Epistaxis   . Unspecified sinusitis (chronic)   . Allergic rhinitis, cause unspecified   . Spinal stenosis, unspecified region other than cervical     Medications:  Scheduled:    . aspirin  81 mg Oral Daily  . atorvastatin  20 mg Oral q1800  . cyanocobalamin  1,000 mcg Intramuscular Once  . docusate sodium  100 mg Oral BID  . finasteride  5 mg Oral QHS  . finasteride  5 mg Oral Daily  . fluticasone  2 spray Each Nare Daily  . loratadine  10 mg Oral Daily  . metoprolol tartrate  25 mg Oral BID  .  morphine injection  4 mg Intravenous Once  . pantoprazole  40 mg Oral Daily  . polyethylene glycol  17 g Oral Daily  . pregabalin  75 mg Oral TID  . sodium chloride  3 mL Intravenous Q12H  . terazosin  5 mg Oral  Daily   Infusions:    Assessment: 78 yo male with hx of afib will be continued on coumadin therapy.  Home coumadin dose was 1mg  every day except 0.5mg  on MWF.  Admitting INR is 3.05.  Last coumadin dose was on 10/16/12.  Goal of Therapy:  INR 2-3    Plan:  1) No coumadin now 2) PT/INR in am to determine if dose tonight.  Lacie Landry, Tsz-Yin 10/18/2012,12:46 AM

## 2012-10-18 NOTE — Progress Notes (Signed)
[facility]                                          Progress note  Pt is a 78yo male with history of CAD status post CABG, atrial fibrillation status post pacemaker placement and on Coumadin, BPH, hypertension with recent L4 compression fracture 2 weeks ago and was placed on pain relief medication and is scheduled to have kyphoplasty done next week by Dr. Corliss Skains. Patient also developed constipation in past 5 days and had come to the ER where het had fecal disimpaction - after which pt was found to have difficulty walking because of low back pain was  admitted for further management. I have consulted IR  And cards for futher recs Will o/w continue current management plan as per Dr Toniann Fail and follow.   Roanna Epley [facility] [phone]

## 2012-10-19 DIAGNOSIS — I4891 Unspecified atrial fibrillation: Secondary | ICD-10-CM | POA: Diagnosis not present

## 2012-10-19 DIAGNOSIS — K5900 Constipation, unspecified: Secondary | ICD-10-CM | POA: Diagnosis not present

## 2012-10-19 DIAGNOSIS — T148XXA Other injury of unspecified body region, initial encounter: Secondary | ICD-10-CM | POA: Diagnosis not present

## 2012-10-19 DIAGNOSIS — I10 Essential (primary) hypertension: Secondary | ICD-10-CM | POA: Diagnosis not present

## 2012-10-19 LAB — PROTIME-INR: INR: 3.21 — ABNORMAL HIGH (ref 0.00–1.49)

## 2012-10-19 NOTE — Progress Notes (Signed)
Patient ID: Dwayne [name], male   DOB: 01/13/1932, 78 y.o.   MRN: [id number]   Lumbar #4 KP on hold awaiting INR to normalize INR: 3.21 this am  Will keep pt "on radar" Will move forward with KP when can safely proceed.  Pt aware pf procedure and hold on procedure

## 2012-10-19 NOTE — Progress Notes (Signed)
[name] PROGRESS NOTE  JOBE MUTCH NWG:[id number] DOB: November 18, 1932 DOA: 10/17/2012 PCP: Sanda Linger, MD  Assessment/Plan: 1. L4 compression fracture with difficulty walking and pain - -continue pain management.Deveshwar/IR consulted for kyphoplasty.  -INR is still trending up this am even with coumadin on hold -cards to advise on if ok to reverse with FFP or vit k for kypho. And resume anticoagulation thereafter #2. Constipation - possibly secondary to patient's recent starting of pain relief medications.  -resolved, continue bowel regimen #3. Atrial fibrillation status post pacemaker placement and on Coumadin - -cards following and assisting with management of anticoagulation #4. CAD status post CABG - denies any chest pain.  #5. BPH - continue present medications.   Code Status: full Family Communication:  Disposition Plan: pending PT eval   Consultants:  Cards  IR  Procedures:  NONE  Antibiotics:  NONE  HPI/Subjective: +bm this am and feels much better, States back pain overall better, but worsens in certain positions   Objective: Filed Vitals:   10/18/12 2205 10/19/12 0513 10/19/12 0655 10/19/12 0700  BP: 118/70 116/78  141/80  Pulse: 69 71  82  Temp: 98.7 F (37.1 C) 97.6 F (36.4 C)  98.1 F (36.7 C)  TempSrc: Oral Oral  Oral  Resp: 17 18  18   Height:   5' 7.5" (1.715 m)   Weight: 73.573 kg (162 lb 3.2 oz)     SpO2: 96% 95%  97%    Intake/Output Summary (Last 24 hours) at 10/19/12 1137 Last data filed at 10/19/12 1000  Gross per 24 hour  Intake    660 ml  Output    950 ml  Net   -290 ml   Filed Weights   10/18/12 0128 10/18/12 2205  Weight: 73.074 kg (161 lb 1.6 oz) 73.573 kg (162 lb 3.2 oz)    Exam:   General:  A&O x3, in NAD  Cardiovascular: Irreg, irreg.rate controlled  Respiratory: CTA  Abdomen: soft +BS, NT/ND  Data Reviewed: Basic Metabolic Panel:  Lab 10/18/12 5784 10/17/12 2056  NA 134* 138  K 3.7 3.8  CL  102 103  CO2 23 24  GLUCOSE 95 112*  BUN 25* 26*  CREATININE 0.71 0.77  CALCIUM 9.0 9.2  MG -- --  PHOS -- --   Liver Function Tests: No results found for this basename: AST:5,ALT:5,ALKPHOS:5,BILITOT:5,PROT:5,ALBUMIN:5 in the last 168 hours No results found for this basename: LIPASE:5,AMYLASE:5 in the last 168 hours No results found for this basename: AMMONIA:5 in the last 168 hours CBC:  Lab 10/18/12 0545 10/17/12 2056  WBC 12.6* 14.1*  NEUTROABS -- --  HGB 12.3* 12.7*  HCT 37.4* 38.1*  MCV 89.3 89.4  PLT 368 378   Cardiac Enzymes: No results found for this basename: CKTOTAL:5,CKMB:5,CKMBINDEX:5,TROPONINI:5 in the last 168 hours BNP (last 3 results) No results found for this basename: PROBNP:3 in the last 8760 hours CBG: No results found for this basename: GLUCAP:5 in the last 168 hours  No results found for this or any previous visit (from the past 240 hour(s)).   Studies: No results found.  Scheduled Meds:   . aspirin  81 mg Oral Daily  . atorvastatin  20 mg Oral q1800  . cyanocobalamin  1,000 mcg Intramuscular Once  . docusate sodium  100 mg Oral BID  . finasteride  5 mg Oral QHS  . fluticasone  2 spray Each Nare Daily  . loratadine  10 mg Oral Daily  . metoprolol tartrate  25 mg  Oral BID  . pantoprazole  40 mg Oral Daily  . polyethylene glycol  17 g Oral Daily  . pregabalin  75 mg Oral TID  . sodium chloride  3 mL Intravenous Q12H  . terazosin  5 mg Oral Daily  . DISCONTD: heparin subcutaneous  5,000 Units Subcutaneous Q8H  . DISCONTD: Warfarin - Pharmacist Dosing Inpatient   Does not apply q1800   Continuous Infusions:   Principal Problem:  *Compression fracture Active Problems:  CAD, ARTERY BYPASS GRAFT  Atrial fibrillation  Constipation    Time spent:    [facility]  [facility] Pager [phone]. If 8PM-8AM, please contact night-coverage at [email or web address], password [facility] 10/19/2012, 11:37 AM  LOS: 2 days

## 2012-10-20 ENCOUNTER — Telehealth: Payer: Self-pay | Admitting: Internal Medicine

## 2012-10-20 ENCOUNTER — Encounter (HOSPITAL_COMMUNITY): Payer: Medicare Other

## 2012-10-20 DIAGNOSIS — I4891 Unspecified atrial fibrillation: Secondary | ICD-10-CM | POA: Diagnosis not present

## 2012-10-20 DIAGNOSIS — I10 Essential (primary) hypertension: Secondary | ICD-10-CM | POA: Diagnosis not present

## 2012-10-20 DIAGNOSIS — Z7901 Long term (current) use of anticoagulants: Secondary | ICD-10-CM

## 2012-10-20 DIAGNOSIS — I25810 Atherosclerosis of coronary artery bypass graft(s) without angina pectoris: Secondary | ICD-10-CM | POA: Diagnosis not present

## 2012-10-20 DIAGNOSIS — K5900 Constipation, unspecified: Secondary | ICD-10-CM | POA: Diagnosis not present

## 2012-10-20 DIAGNOSIS — T148XXA Other injury of unspecified body region, initial encounter: Secondary | ICD-10-CM | POA: Diagnosis not present

## 2012-10-20 LAB — PROTIME-INR
INR: 2.68 — ABNORMAL HIGH (ref 0.00–1.49)
Prothrombin Time: 27.2 seconds — ABNORMAL HIGH (ref 11.6–15.2)

## 2012-10-20 NOTE — Progress Notes (Signed)
Utilization review completed.  

## 2012-10-20 NOTE — Progress Notes (Addendum)
BP 132/70  Pulse 68  Temp 97.9 F (36.6 C) (Oral)  Resp 17  Ht 5' 7.5" (1.715 m)  Wt 159 lb 9.6 oz (72.394 kg)  BMI 24.63 kg/m2  SpO2 92% INR 2.68 Cont to hold Coumadin At this point, procedure likely to be done Mon/Tues Probably not necessary to reverse with Vit K or FFP. Will cont to follow.

## 2012-10-20 NOTE — Progress Notes (Signed)
[name] PROGRESS NOTE  VERLIE HELLENBRAND XLK:[id number] DOB: 01-Jun-1932 DOA: 10/17/2012 PCP: Sanda Linger, MD  Assessment/Plan: 1. L4 compression fracture with difficulty walking and pain - -continue pain management.Deveshwar/IR consulted for kyphoplasty.  -INR beginning to trend down today 11/1 with coumadin on hold -will call cards in am re:if ok to reverse with FFP or vit k for kypho. and resume anticoagulation thereafter #2. Constipation - possibly secondary to patient's recent starting of pain relief medications.  -resolved, continue bowel regimen #3. Atrial fibrillation status post pacemaker placement and on Coumadin - -appreciate cards input #4. CAD status post CABG - denies any chest pain.  #5. BPH - continue present medications.   Code Status: full Family Communication:  Disposition Plan: pending PT eval   Consultants:  Cards  IR  Procedures:  NONE  Antibiotics:  NONE  HPI/Subjective: denies any new c/o, states still not able to ambulate  Objective: Filed Vitals:   10/20/12 0621 10/20/12 1000 10/20/12 1400 10/20/12 1732  BP: 132/70 120/69 102/66 117/67  Pulse: 68 76 70 73  Temp: 97.9 F (36.6 C) 97.7 F (36.5 C) 98 F (36.7 C) 97.9 F (36.6 C)  TempSrc: Oral Oral Oral Oral  Resp: 17 18 18 18   Height:      Weight:      SpO2: 92% 93% 95% 94%    Intake/Output Summary (Last 24 hours) at 10/20/12 1925 Last data filed at 10/20/12 1700  Gross per 24 hour  Intake    853 ml  Output    325 ml  Net    528 ml   Filed Weights   10/18/12 0128 10/18/12 2205 10/19/12 2240  Weight: 73.074 kg (161 lb 1.6 oz) 73.573 kg (162 lb 3.2 oz) 72.394 kg (159 lb 9.6 oz)    Exam:   General:  A&O x3, in NAD  Cardiovascular: Irreg, irreg.rate controlled  Respiratory: CTA  Abdomen: soft +BS, NT/ND  Data Reviewed: Basic Metabolic Panel:  Lab 10/18/12 3664 10/17/12 2056  NA 134* 138  K 3.7 3.8  CL 102 103  CO2 23 24  GLUCOSE 95 112*  BUN 25* 26*    CREATININE 0.71 0.77  CALCIUM 9.0 9.2  MG -- --  PHOS -- --   Liver Function Tests: No results found for this basename: AST:5,ALT:5,ALKPHOS:5,BILITOT:5,PROT:5,ALBUMIN:5 in the last 168 hours No results found for this basename: LIPASE:5,AMYLASE:5 in the last 168 hours No results found for this basename: AMMONIA:5 in the last 168 hours CBC:  Lab 10/18/12 0545 10/17/12 2056  WBC 12.6* 14.1*  NEUTROABS -- --  HGB 12.3* 12.7*  HCT 37.4* 38.1*  MCV 89.3 89.4  PLT 368 378   Cardiac Enzymes: No results found for this basename: CKTOTAL:5,CKMB:5,CKMBINDEX:5,TROPONINI:5 in the last 168 hours BNP (last 3 results) No results found for this basename: PROBNP:3 in the last 8760 hours CBG: No results found for this basename: GLUCAP:5 in the last 168 hours  No results found for this or any previous visit (from the past 240 hour(s)).   Studies: No results found.  Scheduled Meds:    . aspirin  81 mg Oral Daily  . atorvastatin  20 mg Oral q1800  . cyanocobalamin  1,000 mcg Intramuscular Once  . docusate sodium  100 mg Oral BID  . finasteride  5 mg Oral QHS  . fluticasone  2 spray Each Nare Daily  . loratadine  10 mg Oral Daily  . metoprolol tartrate  25 mg Oral BID  . pantoprazole  40  mg Oral Daily  . polyethylene glycol  17 g Oral Daily  . pregabalin  75 mg Oral TID  . sodium chloride  3 mL Intravenous Q12H  . terazosin  5 mg Oral Daily   Continuous Infusions:   Principal Problem:  *Compression fracture Active Problems:  CAD, ARTERY BYPASS GRAFT  Atrial fibrillation  Constipation    Time spent:    [name]  [facility] Pager [phone]. If 8PM-8AM, please contact night-coverage at [email or web address], password [facility] 10/20/2012, 7:25 PM  LOS: 3 days

## 2012-10-20 NOTE — Telephone Encounter (Signed)
The pt is currently admitted to the hospital and Dr Excell Seltzer did Cardiology consult.

## 2012-10-20 NOTE — Telephone Encounter (Signed)
FYI: patient having back surgery 10/24/12, will not be able to come back for 10/20/12 recall until the first of the year when his back heals.

## 2012-10-20 NOTE — Progress Notes (Signed)
    SUBJECTIVE: No complaints. NO events. No chest pain or SOB.   BP 132/70  Pulse 68  Temp 97.9 F (36.6 C) (Oral)  Resp 17  Ht 5' 7.5" (1.715 m)  Wt 159 lb 9.6 oz (72.394 kg)  BMI 24.63 kg/m2  SpO2 92%  Intake/Output Summary (Last 24 hours) at 10/20/12 1627 Last data filed at 10/20/12 0000  Gross per 24 hour  Intake    493 ml  Output    325 ml  Net    168 ml    PHYSICAL EXAM General: Well developed, well nourished, in no acute distress. Alert and oriented x 3.  Psych:  Good affect, responds appropriately Neck: No JVD. No masses noted.  Lungs: Clear bilaterally with no wheezes or rhonci noted.  Heart: RRR with systolic murmur noted. Abdomen: Bowel sounds are present. Soft, non-tender.  Extremities: No lower extremity edema.   LABS: Basic Metabolic Panel:  Basename 10/18/12 0545 10/17/12 2056  NA 134* 138  K 3.7 3.8  CL 102 103  CO2 23 24  GLUCOSE 95 112*  BUN 25* 26*  CREATININE 0.71 0.77  CALCIUM 9.0 9.2  MG -- --  PHOS -- --   CBC:  Basename 10/18/12 0545 10/17/12 2056  WBC 12.6* 14.1*  NEUTROABS -- --  HGB 12.3* 12.7*  HCT 37.4* 38.1*  MCV 89.3 89.4  PLT 368 378   Current Meds:    . aspirin  81 mg Oral Daily  . atorvastatin  20 mg Oral q1800  . cyanocobalamin  1,000 mcg Intramuscular Once  . docusate sodium  100 mg Oral BID  . finasteride  5 mg Oral QHS  . fluticasone  2 spray Each Nare Daily  . loratadine  10 mg Oral Daily  . metoprolol tartrate  25 mg Oral BID  . pantoprazole  40 mg Oral Daily  . polyethylene glycol  17 g Oral Daily  . pregabalin  75 mg Oral TID  . sodium chloride  3 mL Intravenous Q12H  . terazosin  5 mg Oral Daily     ASSESSMENT AND PLAN: 78 y.o. w/ PMHx significant for CAD (s/p CABG '93, s/p PCI of SVG to OM '03), A.fib w/ tachybrady syndrome (on coumadin, s/p [employer] PPM '07), HLD, and HTN who presented to [facility] on 10/17/12 with complaints of back pain and constipation. He has been found to have a L4  compression fracture. Surgery when INR <1.5.   1. L4 compression fracture: Plans for surgical repair once INR < 1.5. Further management per primary team   2. Chronic Atrial fibrillation on coumadin: INR 2.6 today.  No history of stroke. EF normal. Ok to hold coumadin for surgery. No need for heparin bridge. Resume coumadin post op when ok with surgery. Cont BB.   3. CAD: Stable. Very active without anginal symptoms. Cont ASA, BB, statin.   4. Hypertension: BP stable   5. Hyperlipidemia: Cont statin   6. H/o tachybrady syndrome s/p PPM: Stable.   We will follow from a distance this weekend. Awaiting INR to fall below 1.5. No active cardiac issues. Call with questions over weekend.   Dwayne [name],Dwayne [name] 4:29 PM 10/20/2012'     Dwayne [name],Dwayne [name]  11/1/20134:27 PM

## 2012-10-21 DIAGNOSIS — I4891 Unspecified atrial fibrillation: Secondary | ICD-10-CM | POA: Diagnosis not present

## 2012-10-21 DIAGNOSIS — T148XXA Other injury of unspecified body region, initial encounter: Secondary | ICD-10-CM | POA: Diagnosis not present

## 2012-10-21 DIAGNOSIS — K5900 Constipation, unspecified: Secondary | ICD-10-CM | POA: Diagnosis not present

## 2012-10-21 DIAGNOSIS — I10 Essential (primary) hypertension: Secondary | ICD-10-CM | POA: Diagnosis not present

## 2012-10-21 LAB — BASIC METABOLIC PANEL
Calcium: 9.2 mg/dL (ref 8.4–10.5)
Creatinine, Ser: 0.78 mg/dL (ref 0.50–1.35)
GFR calc Af Amer: >90 mL/min (ref >90)
GFR calc non Af Amer: 84 mL/min — ABNORMAL LOW (ref >90)
Sodium: 138 mEq/L (ref 135–145)

## 2012-10-21 LAB — PROTIME-INR
INR: 2.63 — ABNORMAL HIGH (ref 0.00–1.49)
Prothrombin Time: 26.8 seconds — ABNORMAL HIGH (ref 11.6–15.2)

## 2012-10-21 NOTE — Progress Notes (Signed)
[name] PROGRESS NOTE  CLAUDIUS MICH NFA:[id number] DOB: 15-Dec-1932 DOA: 10/17/2012 PCP: Sanda Linger, MD  Assessment/Plan: 1. L4 compression fracture with difficulty walking and pain - -continue pain management.Deveshwar/IR consulted for kyphoplasty.  -INR slowly trending down today with coumadin on hold -recheck INR in am, may need to reverse with FFP or vit k for kypho. and resume anticoagulation thereafter #2. Constipation - possibly secondary to patient's recent starting of pain relief medications.  -resolved, continue bowel regimen #3. Atrial fibrillation status post pacemaker placement and on Coumadin - -appreciate cards input #4. CAD status post CABG - denies any chest pain.  #5. BPH - continue present medications.   Code Status: full Family Communication:  Disposition Plan: pending PT eval   Consultants:  Cards  IR  Procedures:  NONE  Antibiotics:  NONE  HPI/Subjective: No new c/o  Objective: Filed Vitals:   10/20/12 2032 10/21/12 0400 10/21/12 0846 10/21/12 1336  BP: 113/68 132/80 119/67 112/70  Pulse: 87 69 81 68  Temp: 98.3 F (36.8 C) 97.3 F (36.3 C) 97.9 F (36.6 C) 97.8 F (36.6 C)  TempSrc: Oral Oral Oral Oral  Resp: 20 20 18 20   Height: 5' 7.5" (1.715 m)     Weight: 72.031 kg (158 lb 12.8 oz)     SpO2: 93% 95% 95% 100%    Intake/Output Summary (Last 24 hours) at 10/21/12 1608 Last data filed at 10/21/12 1340  Gross per 24 hour  Intake    960 ml  Output    510 ml  Net    450 ml   Filed Weights   10/18/12 2205 10/19/12 2240 10/20/12 2032  Weight: 73.573 kg (162 lb 3.2 oz) 72.394 kg (159 lb 9.6 oz) 72.031 kg (158 lb 12.8 oz)    Exam:   General:  A&O x3, in NAD  Cardiovascular: Irreg, irreg.rate controlled  Respiratory: CTA  Abdomen: soft +BS, NT/ND  Data Reviewed: Basic Metabolic Panel:  Lab 10/21/12 4696 10/18/12 0545 10/17/12 2056  NA 138 134* 138  K 3.6 3.7 3.8  CL 104 102 103  CO2 23 23 24   GLUCOSE  94 95 112*  BUN 22 25* 26*  CREATININE 0.78 0.71 0.77  CALCIUM 9.2 9.0 9.2  MG -- -- --  PHOS -- -- --   Liver Function Tests: No results found for this basename: AST:5,ALT:5,ALKPHOS:5,BILITOT:5,PROT:5,ALBUMIN:5 in the last 168 hours No results found for this basename: LIPASE:5,AMYLASE:5 in the last 168 hours No results found for this basename: AMMONIA:5 in the last 168 hours CBC:  Lab 10/18/12 0545 10/17/12 2056  WBC 12.6* 14.1*  NEUTROABS -- --  HGB 12.3* 12.7*  HCT 37.4* 38.1*  MCV 89.3 89.4  PLT 368 378   Cardiac Enzymes: No results found for this basename: CKTOTAL:5,CKMB:5,CKMBINDEX:5,TROPONINI:5 in the last 168 hours BNP (last 3 results) No results found for this basename: PROBNP:3 in the last 8760 hours CBG: No results found for this basename: GLUCAP:5 in the last 168 hours  No results found for this or any previous visit (from the past 240 hour(s)).   Studies: No results found.  Scheduled Meds:    . aspirin  81 mg Oral Daily  . atorvastatin  20 mg Oral q1800  . cyanocobalamin  1,000 mcg Intramuscular Once  . docusate sodium  100 mg Oral BID  . finasteride  5 mg Oral QHS  . fluticasone  2 spray Each Nare Daily  . loratadine  10 mg Oral Daily  . metoprolol tartrate  25 mg Oral BID  . pantoprazole  40 mg Oral Daily  . polyethylene glycol  17 g Oral Daily  . pregabalin  75 mg Oral TID  . sodium chloride  3 mL Intravenous Q12H  . terazosin  5 mg Oral Daily   Continuous Infusions:   Principal Problem:  *Compression fracture Active Problems:  CAD, ARTERY BYPASS GRAFT  Atrial fibrillation  Constipation    Time spent:    [name]  [facility] Pager [phone]. If 8PM-8AM, please contact night-coverage at [email or web address], password [facility] 10/21/2012, 4:08 PM  LOS: 4 days

## 2012-10-21 NOTE — Evaluation (Signed)
Physical Therapy Evaluation Patient Details Name: Dwayne [name] MRN: [id number] DOB: 02/02/1932 Today's Date: 10/21/2012 Time: 5621-3086 PT Time Calculation (min): 30 min  PT Assessment / Plan / Recommendation Clinical Impression  Pt. was admitted for pain control of L4 vertebral fx, and for constipation.  He has h/o CAD and CABG, Afib with pacer.  Plans are being made for kyphoplasty earrly next week once INR is in range.  He presents to PT with decrease in his balance and gait/functional mobility/safety  as well as pain and will need PT to adress these issues.  At this point, he looks like he will need ST SNF for rehab due to gait and balance issues as well as fall risk.  If after his back procedure his mobility improves, will change DC recommendations.    PT Assessment  Patient needs continued PT services    Follow Up Recommendations  Supervision/Assistance - 24 hour;Post acute inpatient;Supervision for mobility/OOB    Does the patient have the potential to tolerate intense rehabilitation   No, Recommend SNF  Barriers to Discharge Decreased caregiver support      Equipment Recommendations  Rolling walker with 5" wheels;3 in 1 bedside comode    Recommendations for Other Services     Frequency Min 4X/week    Precautions / Restrictions Precautions Precautions: Back;Other (comment) (for comfort and pain control) Precaution Comments: instructed in log rolling Restrictions Weight Bearing Restrictions: No   Pertinent Vitals/Pain Pain in low back 2/10; RN made aware and pt. Left in recliner in comfortable position      Mobility  Bed Mobility Bed Mobility: Rolling Left;Left Sidelying to Sit;Supine to Sit;Sitting - Scoot to Edge of Bed;Sit to Supine Rolling Left: 4: Min assist;With rail Left Sidelying to Sit: 3: Mod assist;HOB flat;With rails Supine to Sit: Not tested (comment) Sitting - Scoot to Edge of Bed: 4: Min assist Sit to Supine: Not Tested (comment) Details for Bed  Mobility Assistance: pt. required technique cues for log rolling and safety Transfers Transfers: Sit to Stand;Stand to Sit Sit to Stand: 3: Mod assist;With upper extremity assist;From bed;From chair/3-in-1;Without upper extremity assist Stand to Sit: 3: Mod assist;To bed;To chair/3-in-1;With upper extremity assist;With armrests Details for Transfer Assistance: Pt. needed specific cues for hand placement for safe technique  in standing up and sitting down.  Has posterior tendance and difficulty translating weight forward for effective standing. Ambulation/Gait Ambulation/Gait Assistance: 3: Mod assist Ambulation Distance (Feet): 12 Feet Assistive device: Rolling walker Ambulation/Gait Assistance Details: Pt. has posterior tendancy and tends tostep too far into RW.  Needs vc and manual assist to correct and steady/balance. Gait Pattern: Step-to pattern;Trunk flexed Gait velocity: slowed Stairs: No Wheelchair Mobility Wheelchair Mobility: No    Shoulder Instructions     Exercises     PT Diagnosis: Difficulty walking;Acute pain  PT Problem List: Decreased activity tolerance;Decreased balance;Decreased mobility;Decreased knowledge of use of DME;Pain PT Treatment Interventions: DME instruction;Gait training;Functional mobility training;Therapeutic activities;Balance training;Patient/family education   PT Goals Acute Rehab PT Goals PT Goal Formulation: With patient Time For Goal Achievement: 10/28/12 Potential to Achieve Goals: Good Pt will go Supine/Side to Sit: with supervision PT Goal: Supine/Side to Sit - Progress: Goal set today Pt will go Sit to Supine/Side: with supervision PT Goal: Sit to Supine/Side - Progress: Goal set today Pt will go Sit to Stand: with supervision PT Goal: Sit to Stand - Progress: Goal set today Pt will go Stand to Sit: with supervision PT Goal: Stand to Sit - Progress: Goal set  today Pt will Transfer Bed to Chair/Chair to Bed: with min assist PT Transfer  Goal: Bed to Chair/Chair to Bed - Progress: Goal set today Pt will Ambulate: 51 - 150 feet;with min assist PT Goal: Ambulate - Progress: Goal set today Pt will Go Up / Down Stairs: 1-2 stairs;with min assist PT Goal: Up/Down Stairs - Progress: Goal set today  Visit Information  Last PT Received On: 10/21/12 Assistance Needed: +1    Subjective Data  Subjective: "I think this happened about 2 weeks ago" Patient Stated Goal: reduced pain   Prior Functioning  Home Living Lives With: Spouse Available Help at Discharge: Family;Available 24 hours/day Type of Home: House Home Access: Stairs to enter [employer] of Steps: 1 Entrance Stairs-Rails: Right;Left;Can reach both Home Layout: One level Bathroom Shower/Tub: Forensic scientist: Handicapped height Bathroom Accessibility: Yes How Accessible: Accessible via walker Home Adaptive Equipment: None Prior Function Level of Independence: Independent Able to Take Stairs?: Yes Driving: Yes Vocation: Retired Musician: No difficulties Dominant Hand: Right    Cognition  Overall Cognitive Status: History of cognitive impairments - at baseline Arousal/Alertness: Awake/alert Orientation Level: Appears intact for tasks assessed;Other (comment) (relied on watch for day and date) Behavior During Session: [facility] for tasks performed Cognition - Other Comments: decrease in sthor term memory noted    Extremity/Trunk Assessment Right Upper Extremity Assessment RUE ROM/Strength/Tone: [facility] for tasks assessed Left Upper Extremity Assessment LUE ROM/Strength/Tone: WFL for tasks assessed Right Lower Extremity Assessment RLE ROM/Strength/Tone: WFL for tasks assessed RLE Sensation: WFL - Light Touch RLE Coordination: WFL - gross/fine motor Left Lower Extremity Assessment LLE ROM/Strength/Tone: WFL for tasks assessed LLE Sensation: WFL - Light Touch LLE Coordination: WFL - gross/fine motor Trunk  Assessment Trunk Assessment: Kyphotic   Balance Balance Balance Assessed: Yes Static Sitting Balance Static Sitting - Balance Support: Bilateral upper extremity supported;Feet supported Static Sitting - Level of Assistance: 6: Modified independent (Device/Increase time) Static Standing Balance Static Standing - Balance Support: Right upper extremity supported;Left upper extremity supported;During functional activity Static Standing - Level of Assistance: 3: Mod assist  End of Session PT - End of Session Equipment Utilized During Treatment: Gait belt Activity Tolerance: Patient limited by fatigue Patient left: in chair;with call bell/phone within reach Nurse Communication: Mobility status;Precautions;Other (comment) (pt.'s unsteadiness/decreased balance in gait )  GP Functional Assessment Tool Used: clinical judgement Functional Limitation: Mobility: Walking and moving around Mobility: Walking and Moving Around Current Status [phone]): At least 60 percent but less than 80 percent impaired, limited or restricted Mobility: Walking and Moving Around Goal Status [phone]): At least 20 percent but less than 40 percent impaired, limited or restricted   Ferman Hamming 10/21/2012, 9:00 AM Weldon Picking PT Acute Rehab Services [phone] Beeper [phone]

## 2012-10-22 DIAGNOSIS — I4891 Unspecified atrial fibrillation: Secondary | ICD-10-CM | POA: Diagnosis not present

## 2012-10-22 DIAGNOSIS — K5900 Constipation, unspecified: Secondary | ICD-10-CM | POA: Diagnosis not present

## 2012-10-22 DIAGNOSIS — Z950 Presence of cardiac pacemaker: Secondary | ICD-10-CM

## 2012-10-22 DIAGNOSIS — T148XXA Other injury of unspecified body region, initial encounter: Secondary | ICD-10-CM | POA: Diagnosis not present

## 2012-10-22 LAB — PROTIME-INR
INR: 2.39 — ABNORMAL HIGH (ref 0.00–1.49)
Prothrombin Time: 25 seconds — ABNORMAL HIGH (ref 11.6–15.2)

## 2012-10-22 MED ORDER — LORAZEPAM 1 MG PO TABS
1.0000 mg | ORAL_TABLET | Freq: Once | ORAL | Status: AC
  Administered 2012-10-22: 1 mg via ORAL
  Filled 2012-10-22: qty 1

## 2012-10-22 NOTE — Progress Notes (Signed)
[name] PROGRESS NOTE  Dwayne [name] YNW:[id number] DOB: July 08, 1932 DOA: 10/17/2012 PCP: Sanda Linger, MD  Assessment/Plan: 1. L4 compression fracture with difficulty walking and pain - -continue pain management.Deveshwar/IR consulted for kyphoplasty.  -INR continuing to slowly trend down today with coumadin on hold -discussed reversing with FFP or vit k for kypho with Dr Anne Fu on call for [facility] cards and resuming anticoagulation thereafter - he recommends not to, stating to continue to hold as we are and recheck in am #2. Constipation - possibly secondary to patient's recent starting of pain relief medications.  -resolved, continue bowel regimen #3. Atrial fibrillation status post pacemaker placement and on Coumadin  As above. -appreciate cards input #4. CAD status post CABG - denies any chest pain.  #5. BPH - continue present medications.   Code Status: full Family Communication:  Disposition Plan: pending PT eval   Consultants:  Cards  IR  Procedures:  NONE  Antibiotics:  NONE  HPI/Subjective: No new c/o, still with low back pain and difficulty walking  Objective: Filed Vitals:   10/21/12 1336 10/21/12 1805 10/21/12 2211 10/22/12 0559  BP: 112/70 129/77 126/63 138/61  Pulse: 68 66 80 65  Temp: 97.8 F (36.6 C) 98.5 F (36.9 C) 97.3 F (36.3 C) 97.8 F (36.6 C)  TempSrc: Oral Oral Oral Oral  Resp: 20 18 18 16   Height:      Weight:   71.668 kg (158 lb)   SpO2: 100% 100% 94% 95%    Intake/Output Summary (Last 24 hours) at 10/22/12 1342 Last data filed at 10/22/12 1000  Gross per 24 hour  Intake    900 ml  Output    200 ml  Net    700 ml   Filed Weights   10/19/12 2240 10/20/12 2032 10/21/12 2211  Weight: 72.394 kg (159 lb 9.6 oz) 72.031 kg (158 lb 12.8 oz) 71.668 kg (158 lb)    Exam:   General:  A&O x3, in NAD  Cardiovascular: Irreg, irreg.rate controlled  Respiratory: CTA  Abdomen: soft +BS, NT/ND  Data Reviewed: Basic  Metabolic Panel:  Lab 10/21/12 6578 10/18/12 0545 10/17/12 2056  NA 138 134* 138  K 3.6 3.7 3.8  CL 104 102 103  CO2 23 23 24   GLUCOSE 94 95 112*  BUN 22 25* 26*  CREATININE 0.78 0.71 0.77  CALCIUM 9.2 9.0 9.2  MG -- -- --  PHOS -- -- --   Liver Function Tests: No results found for this basename: AST:5,ALT:5,ALKPHOS:5,BILITOT:5,PROT:5,ALBUMIN:5 in the last 168 hours No results found for this basename: LIPASE:5,AMYLASE:5 in the last 168 hours No results found for this basename: AMMONIA:5 in the last 168 hours CBC:  Lab 10/18/12 0545 10/17/12 2056  WBC 12.6* 14.1*  NEUTROABS -- --  HGB 12.3* 12.7*  HCT 37.4* 38.1*  MCV 89.3 89.4  PLT 368 378   Cardiac Enzymes: No results found for this basename: CKTOTAL:5,CKMB:5,CKMBINDEX:5,TROPONINI:5 in the last 168 hours BNP (last 3 results) No results found for this basename: PROBNP:3 in the last 8760 hours CBG: No results found for this basename: GLUCAP:5 in the last 168 hours  No results found for this or any previous visit (from the past 240 hour(s)).   Studies: No results found.  Scheduled Meds:    . aspirin  81 mg Oral Daily  . atorvastatin  20 mg Oral q1800  . cyanocobalamin  1,000 mcg Intramuscular Once  . docusate sodium  100 mg Oral BID  . finasteride  5 mg  Oral QHS  . fluticasone  2 spray Each Nare Daily  . loratadine  10 mg Oral Daily  . metoprolol tartrate  25 mg Oral BID  . pantoprazole  40 mg Oral Daily  . polyethylene glycol  17 g Oral Daily  . pregabalin  75 mg Oral TID  . sodium chloride  3 mL Intravenous Q12H  . terazosin  5 mg Oral Daily   Continuous Infusions:   Principal Problem:  *Compression fracture Active Problems:  CAD, ARTERY BYPASS GRAFT  Atrial fibrillation  Constipation    Time spent:    [name]  [facility] Pager [phone]. If 8PM-8AM, please contact night-coverage at [email or web address], password [facility] 10/22/2012, 1:42 PM  LOS: 5 days

## 2012-10-22 NOTE — Progress Notes (Addendum)
Patient ID: Dwayne [name], male   DOB: [birth year], 78 y.o.   MRN: [id number] Pt tent scheduled for L4 VP/KP once INR < 1.7 Today's PT/INR is 25.0/2.39. Will cont to monitor and leave decision to reverse anticoagulation to primary team.

## 2012-10-23 ENCOUNTER — Encounter (HOSPITAL_COMMUNITY): Payer: Medicare Other

## 2012-10-23 DIAGNOSIS — T148XXA Other injury of unspecified body region, initial encounter: Secondary | ICD-10-CM | POA: Diagnosis not present

## 2012-10-23 DIAGNOSIS — I4891 Unspecified atrial fibrillation: Secondary | ICD-10-CM | POA: Diagnosis not present

## 2012-10-23 DIAGNOSIS — Z7901 Long term (current) use of anticoagulants: Secondary | ICD-10-CM | POA: Diagnosis not present

## 2012-10-23 NOTE — Progress Notes (Signed)
[name] PROGRESS NOTE  Dwayne [name] YQM:[id number] DOB: [birth year] DOA: 10/17/2012 PCP: Sanda Linger, MD  Assessment/Plan: 1. L4 compression fracture with difficulty walking and pain - -continue pain management.Deveshwar/IR consulted for kyphoplasty.  -INR continuing to slowly trend down today with coumadin on hold -discussed reversing with FFP or vit k for kypho with Dr Anne Fu 11/3 on call for [place] cards and resuming anticoagulation thereafter - he recommends not to, stating to continue to hold as we are and recheck in am -Kyphoplasty when INR 1.5 or less per IR #2. Constipation - possibly secondary to patient's recent starting of pain relief medications.  -resolved, continue bowel regimen #3. Atrial fibrillation status post pacemaker placement and on Coumadin  As above. -appreciate cards input #4. CAD status post CABG - denies any chest pain.  #5. BPH - continue present medications.   Code Status: full Family Communication:  Disposition Plan: pending PT eval   Consultants:  Cards  IR  Procedures:  NONE  Antibiotics:  NONE  HPI/Subjective:   denies new c/o, still with low back pain and difficulty walking  Objective: Filed Vitals:   10/22/12 1400 10/22/12 2046 10/23/12 0519 10/23/12 0906  BP: 110/66 120/69 127/75 126/70  Pulse: 67 64 73 75  Temp: 97.8 F (36.6 C) 98.1 F (36.7 C) 97.4 F (36.3 C) 98 F (36.7 C)  TempSrc: Oral Oral Oral   Resp: 20 20 20 18   Height:      Weight:  70.806 kg (156 lb 1.6 oz)    SpO2: 97% 97% 98% 97%    Intake/Output Summary (Last 24 hours) at 10/23/12 1112 Last data filed at 10/23/12 0907  Gross per 24 hour  Intake    300 ml  Output    475 ml  Net   -175 ml   Filed Weights   10/20/12 2032 10/21/12 2211 10/22/12 2046  Weight: 72.031 kg (158 lb 12.8 oz) 71.668 kg (158 lb) 70.806 kg (156 lb 1.6 oz)    Exam:   General:  A&O x3, in NAD  Cardiovascular: Irreg, irreg.rate controlled  Respiratory:  CTA  Abdomen: soft +BS, NT/ND  Data Reviewed: Basic Metabolic Panel:  Lab 10/21/12 5284 10/18/12 0545 10/17/12 2056  NA 138 134* 138  K 3.6 3.7 3.8  CL 104 102 103  CO2 23 23 24   GLUCOSE 94 95 112*  BUN 22 25* 26*  CREATININE 0.78 0.71 0.77  CALCIUM 9.2 9.0 9.2  MG -- -- --  PHOS -- -- --   Liver Function Tests: No results found for this basename: AST:5,ALT:5,ALKPHOS:5,BILITOT:5,PROT:5,ALBUMIN:5 in the last 168 hours No results found for this basename: LIPASE:5,AMYLASE:5 in the last 168 hours No results found for this basename: AMMONIA:5 in the last 168 hours CBC:  Lab 10/18/12 0545 10/17/12 2056  WBC 12.6* 14.1*  NEUTROABS -- --  HGB 12.3* 12.7*  HCT 37.4* 38.1*  MCV 89.3 89.4  PLT 368 378   Cardiac Enzymes: No results found for this basename: CKTOTAL:5,CKMB:5,CKMBINDEX:5,TROPONINI:5 in the last 168 hours BNP (last 3 results) No results found for this basename: PROBNP:3 in the last 8760 hours CBG: No results found for this basename: GLUCAP:5 in the last 168 hours  No results found for this or any previous visit (from the past 240 hour(s)).   Studies: No results found.  Scheduled Meds:    . aspirin  81 mg Oral Daily  . atorvastatin  20 mg Oral q1800  . cyanocobalamin  1,000 mcg Intramuscular Once  . docusate  sodium  100 mg Oral BID  . finasteride  5 mg Oral QHS  . fluticasone  2 spray Each Nare Daily  . loratadine  10 mg Oral Daily  . [COMPLETED] LORazepam  1 mg Oral Once  . metoprolol tartrate  25 mg Oral BID  . pantoprazole  40 mg Oral Daily  . polyethylene glycol  17 g Oral Daily  . pregabalin  75 mg Oral TID  . sodium chloride  3 mL Intravenous Q12H  . terazosin  5 mg Oral Daily   Continuous Infusions:   Principal Problem:  *Compression fracture Active Problems:  CAD, ARTERY BYPASS GRAFT  Atrial fibrillation  Constipation    Time spent:    [name]  [facility] Pager [phone]. If 8PM-8AM, please contact  night-coverage at [email or web address], password [facility] 10/23/2012, 11:12 AM  LOS: 6 days

## 2012-10-23 NOTE — Progress Notes (Signed)
Physical Therapy Treatment Patient Details Name: Dwayne [name] MRN: [id number] DOB: Jul 13, 1932 Today's Date: 10/23/2012 Time: 6962-9528 PT Time Calculation (min): 24 min  PT Assessment / Plan / Recommendation Comments on Treatment Session  Admitted with back pain and Compression fx; Await INR to be at optimal level for Kyphoplasty (maybe tomorrow); Making slow progress with PT, with better RW management and some incr activity tol and amb distance  Feel an OT consult to assess ADLs will be helpful in decision-making re: dc home or to dc to SNF    Follow Up Recommendations  Supervision/Assistance - 24 hour;Post acute inpatient;Supervision for mobility/OOB     Does the patient have the potential to tolerate intense rehabilitation  No, Recommend SNF  Barriers to Discharge        Equipment Recommendations  Rolling walker with 5" wheels;3 in 1 bedside comode    Recommendations for Other Services OT consult (MD aware)  Frequency Min 4X/week   Plan Discharge plan remains appropriate    Precautions / Restrictions Precautions Precautions: Back;Other (comment) (for comfort) Precaution Comments: instructed in log rolling Restrictions Weight Bearing Restrictions: No   Pertinent Vitals/Pain Some grimace with mobility, but no specific reports of pain; Repositioned in recliner    Mobility  Bed Mobility Bed Mobility: Rolling Left;Left Sidelying to Sit Rolling Left: 4: Min assist;With rail Left Sidelying to Sit: 3: Mod assist;HOB flat;With rails Details for Bed Mobility Assistance: pt. required technique cues for log rolling and safety; Verbal and tactile cues to roll to full sidelying  Transfers Transfers: Sit to Stand;Stand to Sit Sit to Stand: 3: Mod assist;With upper extremity assist;From bed;From chair/3-in-1;Without upper extremity assist Stand to Sit: 4: Min assist;To chair/3-in-1;With upper extremity assist Details for Transfer Assistance: Pt. needed specific cues for hand placement  for safe technique  in standing up and sitting down.  Has posterior tendance and difficulty translating weight forward for effective standing. Ambulation/Gait Ambulation/Gait Assistance: 3: Mod assist Ambulation Distance (Feet): 20 Feet Assistive device: Rolling walker Ambulation/Gait Assistance Details: Light mod assist today, mostly for RW management; Seems to have better steps in relation to RW today; Fatigues with incr distance, and time standing Gait Pattern: Step-to pattern;Trunk flexed Gait velocity: slowed    Exercises     PT Diagnosis:    PT Problem List:   PT Treatment Interventions:     PT Goals Acute Rehab PT Goals Time For Goal Achievement: 10/28/12 Potential to Achieve Goals: Good Pt will go Supine/Side to Sit: with supervision PT Goal: Supine/Side to Sit - Progress: Progressing toward goal Pt will go Sit to Supine/Side: with supervision PT Goal: Sit to Supine/Side - Progress: Progressing toward goal Pt will go Sit to Stand: with supervision PT Goal: Sit to Stand - Progress: Progressing toward goal Pt will go Stand to Sit: with supervision PT Goal: Stand to Sit - Progress: Progressing toward goal Pt will Ambulate: 51 - 150 feet;with min assist PT Goal: Ambulate - Progress: Progressing toward goal  Visit Information  Last PT Received On: 10/23/12 Assistance Needed: +1    Subjective Data  Subjective: Agreeable to amb Patient Stated Goal: reduced pain   Cognition  Overall Cognitive Status: History of cognitive impairments - at baseline Arousal/Alertness: Awake/alert Orientation Level: Appears intact for tasks assessed;Other (comment) Behavior During Session: [facility] for tasks performed Cognition - Other Comments: decrease in sthor term memory noted    Balance     End of Session PT - End of Session Equipment Utilized During Treatment: Gait belt Activity  Tolerance: Patient limited by fatigue Patient left: in chair;with call bell/phone within reach Nurse  Communication: Mobility status   GP Functional Assessment Tool Used: clinical judgement Functional Limitation: Mobility: Walking and moving around Mobility: Walking and Moving Around Current Status (Z6109): At least 40 percent but less than 60 percent impaired, limited or restricted Mobility: Walking and Moving Around Goal Status [phone]): At least 20 percent but less than 40 percent impaired, limited or restricted   Van Clines [facility] [place], [place] [phone]  10/23/2012, 11:09 AM

## 2012-10-23 NOTE — Progress Notes (Signed)
Patient ID: Dwayne [name], male   DOB: 1932/02/16, 78 y.o.   MRN: [id number]   Awaiting to perform L4 Kyphoplasty when INR wnl INR today 1.96 Will check in am  Need 1.5 or lower

## 2012-10-24 ENCOUNTER — Ambulatory Visit (HOSPITAL_COMMUNITY): Admission: RE | Admit: 2012-10-24 | Payer: Medicare Other | Source: Ambulatory Visit

## 2012-10-24 DIAGNOSIS — J479 Bronchiectasis, uncomplicated: Secondary | ICD-10-CM | POA: Diagnosis present

## 2012-10-24 DIAGNOSIS — K5909 Other constipation: Secondary | ICD-10-CM | POA: Diagnosis present

## 2012-10-24 DIAGNOSIS — R262 Difficulty in walking, not elsewhere classified: Secondary | ICD-10-CM | POA: Diagnosis not present

## 2012-10-24 DIAGNOSIS — R279 Unspecified lack of coordination: Secondary | ICD-10-CM | POA: Diagnosis not present

## 2012-10-24 DIAGNOSIS — E538 Deficiency of other specified B group vitamins: Secondary | ICD-10-CM | POA: Diagnosis not present

## 2012-10-24 DIAGNOSIS — M810 Age-related osteoporosis without current pathological fracture: Secondary | ICD-10-CM | POA: Diagnosis not present

## 2012-10-24 DIAGNOSIS — I4891 Unspecified atrial fibrillation: Secondary | ICD-10-CM | POA: Diagnosis not present

## 2012-10-24 DIAGNOSIS — Z7901 Long term (current) use of anticoagulants: Secondary | ICD-10-CM | POA: Diagnosis not present

## 2012-10-24 DIAGNOSIS — K5900 Constipation, unspecified: Secondary | ICD-10-CM | POA: Diagnosis not present

## 2012-10-24 DIAGNOSIS — M549 Dorsalgia, unspecified: Secondary | ICD-10-CM | POA: Diagnosis not present

## 2012-10-24 DIAGNOSIS — S32009A Unspecified fracture of unspecified lumbar vertebra, initial encounter for closed fracture: Secondary | ICD-10-CM | POA: Diagnosis not present

## 2012-10-24 DIAGNOSIS — I2510 Atherosclerotic heart disease of native coronary artery without angina pectoris: Secondary | ICD-10-CM | POA: Diagnosis present

## 2012-10-24 DIAGNOSIS — Z9861 Coronary angioplasty status: Secondary | ICD-10-CM | POA: Diagnosis not present

## 2012-10-24 DIAGNOSIS — T148XXA Other injury of unspecified body region, initial encounter: Secondary | ICD-10-CM | POA: Diagnosis not present

## 2012-10-24 DIAGNOSIS — Z950 Presence of cardiac pacemaker: Secondary | ICD-10-CM | POA: Diagnosis not present

## 2012-10-24 DIAGNOSIS — M6281 Muscle weakness (generalized): Secondary | ICD-10-CM | POA: Diagnosis not present

## 2012-10-24 DIAGNOSIS — M8448XD Pathological fracture, other site, subsequent encounter for fracture with routine healing: Secondary | ICD-10-CM | POA: Diagnosis not present

## 2012-10-24 DIAGNOSIS — Z79899 Other long term (current) drug therapy: Secondary | ICD-10-CM | POA: Diagnosis not present

## 2012-10-24 DIAGNOSIS — J309 Allergic rhinitis, unspecified: Secondary | ICD-10-CM | POA: Diagnosis not present

## 2012-10-24 DIAGNOSIS — Z5189 Encounter for other specified aftercare: Secondary | ICD-10-CM | POA: Diagnosis not present

## 2012-10-24 DIAGNOSIS — N400 Enlarged prostate without lower urinary tract symptoms: Secondary | ICD-10-CM | POA: Diagnosis present

## 2012-10-24 DIAGNOSIS — I10 Essential (primary) hypertension: Secondary | ICD-10-CM | POA: Diagnosis present

## 2012-10-24 DIAGNOSIS — M8448XA Pathological fracture, other site, initial encounter for fracture: Secondary | ICD-10-CM | POA: Diagnosis not present

## 2012-10-24 DIAGNOSIS — E785 Hyperlipidemia, unspecified: Secondary | ICD-10-CM | POA: Diagnosis present

## 2012-10-24 DIAGNOSIS — Z951 Presence of aortocoronary bypass graft: Secondary | ICD-10-CM | POA: Diagnosis not present

## 2012-10-24 DIAGNOSIS — M545 Low back pain, unspecified: Secondary | ICD-10-CM | POA: Diagnosis not present

## 2012-10-24 DIAGNOSIS — Z7982 Long term (current) use of aspirin: Secondary | ICD-10-CM | POA: Diagnosis not present

## 2012-10-24 DIAGNOSIS — S2239XA Fracture of one rib, unspecified side, initial encounter for closed fracture: Secondary | ICD-10-CM | POA: Diagnosis not present

## 2012-10-24 MED ORDER — LORAZEPAM 1 MG PO TABS
1.0000 mg | ORAL_TABLET | Freq: Once | ORAL | Status: AC
  Administered 2012-10-24: 1 mg via ORAL
  Filled 2012-10-24: qty 1

## 2012-10-24 MED ORDER — LORAZEPAM 1 MG PO TABS
1.0000 mg | ORAL_TABLET | Freq: Every evening | ORAL | Status: AC | PRN
  Administered 2012-10-24 – 2012-10-25 (×2): 1 mg via ORAL
  Filled 2012-10-24 (×2): qty 1

## 2012-10-24 NOTE — Progress Notes (Signed)
Following at a distance.  INR 1.6.  Probably for surgery tomorrow.  TS

## 2012-10-24 NOTE — Progress Notes (Signed)
Dwayne [name] [facility] PROGRESS NOTE  Dwayne [name] GEX:[id number] DOB: [birth year] DOA: 10/17/2012 PCP: Sanda Linger, MD  Assessment/Plan: 1. L4 compression fracture with difficulty walking and pain - -continue pain management.Deveshwar/IR consulted for kyphoplasty.  -INR continuing to slowly trend down-1.65 today with coumadin on hold -discussed reversing with FFP or vit k for kypho with Dr Anne Fu 11/3 on call for [place] cards and resuming anticoagulation thereafter - he recommended not to, stating to continue to hold as we are and recheck. -Kyphoplasty when INR 1.5 or less per IR #2. Constipation - possibly secondary to patient's recent starting of pain relief medications.  -resolved, continue bowel regimen #3. Atrial fibrillation status post pacemaker placement and on Coumadin  As above. -appreciate cards input #4. CAD status post CABG - denies any chest pain.  #5. BPH - continue present medications.   Code Status: full Family Communication:  Disposition Plan: pending PT eval   Consultants:  Cards  IR  Procedures:  NONE  Antibiotics:  NONE  HPI/Subjective:   denies new c/o, hopeful that surgery will be am  Objective: Filed Vitals:   10/23/12 2000 10/24/12 0446 10/24/12 0800 10/24/12 0936  BP: 116/80 127/59 116/79 116/79  Pulse: 70 73 76   Temp: 97.9 F (36.6 C) 97.4 F (36.3 C) 98.3 F (36.8 C)   TempSrc: Oral Oral Oral   Resp: 18 18 18    Height:      Weight: 71.7 kg (158 lb 1.1 oz)     SpO2: 95% 97% 98%     Intake/Output Summary (Last 24 hours) at 10/24/12 1154 Last data filed at 10/24/12 0900  Gross per 24 hour  Intake    480 ml  Output    860 ml  Net   -380 ml   Filed Weights   10/21/12 2211 10/22/12 2046 10/23/12 2000  Weight: 71.668 kg (158 lb) 70.806 kg (156 lb 1.6 oz) 71.7 kg (158 lb 1.1 oz)    Exam:   General:  A&O x3, in NAD  Cardiovascular: Irreg, irreg.rate controlled  Respiratory: CTA  Abdomen: soft +BS, NT/ND  Data  Reviewed: Basic Metabolic Panel:  Lab 10/21/12 0102 10/18/12 0545 10/17/12 2056  NA 138 134* 138  K 3.6 3.7 3.8  CL 104 102 103  CO2 23 23 24   GLUCOSE 94 95 112*  BUN 22 25* 26*  CREATININE 0.78 0.71 0.77  CALCIUM 9.2 9.0 9.2  MG -- -- --  PHOS -- -- --   Liver Function Tests: No results found for this basename: AST:5,ALT:5,ALKPHOS:5,BILITOT:5,PROT:5,ALBUMIN:5 in the last 168 hours No results found for this basename: LIPASE:5,AMYLASE:5 in the last 168 hours No results found for this basename: AMMONIA:5 in the last 168 hours CBC:  Lab 10/18/12 0545 10/17/12 2056  WBC 12.6* 14.1*  NEUTROABS -- --  HGB 12.3* 12.7*  HCT 37.4* 38.1*  MCV 89.3 89.4  PLT 368 378   Cardiac Enzymes: No results found for this basename: CKTOTAL:5,CKMB:5,CKMBINDEX:5,TROPONINI:5 in the last 168 hours BNP (last 3 results) No results found for this basename: PROBNP:3 in the last 8760 hours CBG: No results found for this basename: GLUCAP:5 in the last 168 hours  No results found for this or any previous visit (from the past 240 hour(s)).   Studies: No results found.  Scheduled Meds:    . aspirin  81 mg Oral Daily  . atorvastatin  20 mg Oral q1800  . cyanocobalamin  1,000 mcg Intramuscular Once  . docusate sodium  100 mg Oral BID  .  finasteride  5 mg Oral QHS  . fluticasone  2 spray Each Nare Daily  . loratadine  10 mg Oral Daily  . [COMPLETED] LORazepam  1 mg Oral Once  . metoprolol tartrate  25 mg Oral BID  . pantoprazole  40 mg Oral Daily  . polyethylene glycol  17 g Oral Daily  . pregabalin  75 mg Oral TID  . sodium chloride  3 mL Intravenous Q12H  . terazosin  5 mg Oral Daily   Continuous Infusions:   Principal Problem:  *Compression fracture Active Problems:  CAD, ARTERY BYPASS GRAFT  Atrial fibrillation  Constipation    Time spent:    [name]  [facility] Pager [phone]. If 8PM-8AM, please contact night-coverage at [email or web address], password  [facility] 10/24/2012, 11:54 AM  LOS: 7 days

## 2012-10-24 NOTE — Progress Notes (Signed)
Utilization review completed.  

## 2012-10-24 NOTE — Progress Notes (Signed)
Patient ID: Dwayne [name], male   DOB: October 31, 1932, 78 y.o.   MRN: [id number]    L4 VP/KP awaiting INR to normalize Today 1.65  Probable move forward 11/6 RN aware

## 2012-10-24 NOTE — Evaluation (Signed)
Occupational Therapy Evaluation Patient Details Name: Dwayne [name] MRN: [id number] DOB: 01/06/1932 Today's Date: 10/24/2012 Time: 4098-1191 OT Time Calculation (min): 24 min  OT Assessment / Plan / Recommendation Clinical Impression  This 78 year old man was admitted for compression fx; he is awaiting kyphoplasty/sacroplasty when INR allows.  Pt lives with wife who has Parkinson's Disease (per pt).  He is appropriate for skilled OT to increase safety and independence with adls to reach a min A level in acute    OT Assessment  Patient needs continued OT Services    Follow Up Recommendations  SNF;Supervision/Assistance - 24 hour (vs home with 24 hour assist)    Barriers to Discharge      Equipment Recommendations  Rolling walker with 5" wheels;3 in 1 bedside comode (HHOT can further assess for tub dme when appropriate)    Recommendations for Other Services    Frequency  Min 2X/week    Precautions / Restrictions Precautions Precautions: Back (for comfort) Precaution Comments: instructed in log rolling Restrictions Weight Bearing Restrictions: No   Pertinent Vitals/Pain Back uncomfortable--not rated    ADL  Grooming: Simulated;Teeth care;Set up Where Assessed - Grooming: Unsupported sitting Upper Body Bathing: Simulated;Set up Where Assessed - Upper Body Bathing: Unsupported sitting Lower Body Bathing: Simulated;Moderate assistance (mod A to maintain standing balance) Where Assessed - Lower Body Bathing: Supported sit to stand Upper Body Dressing: Simulated;Set up Where Assessed - Upper Body Dressing: Unsupported sitting Lower Body Dressing: Simulated;Performed;Moderate assistance (to maintain standing balance) Where Assessed - Lower Body Dressing: Supported sit to stand Toileting - Architect and Hygiene: Simulated;Moderate assistance (for balance) Transfers/Ambulation Related to ADLs: sit to stand only.  Had planned to walk to sink.  Pt had difficulty maintaining  static standing balance ADL Comments: Pt is able to cross legs for LB adls.      OT Diagnosis: Generalized weakness  OT Problem List: Decreased strength;Decreased activity tolerance;Decreased cognition;Decreased safety awareness;Decreased knowledge of precautions;Pain OT Treatment Interventions: Self-care/ADL training;DME and/or AE instruction;Therapeutic activities;Cognitive remediation/compensation;Patient/family education;Balance training   OT Goals Acute Rehab OT Goals OT Goal Formulation: With patient (agrees in general) Time For Goal Achievement: 11/07/12 Potential to Achieve Goals: Good ADL Goals Pt Will Transfer to Toilet: with min assist;Stand pivot transfer;3-in-1 ADL Goal: Toilet Transfer - Progress: Goal set today Miscellaneous OT Goals Miscellaneous OT Goal #1: Pt will maintain static standing balance during adls with min A for 2-3 minutes OT Goal: Miscellaneous Goal #1 - Progress: Goal set today Miscellaneous OT Goal #2: Pt will perform bed mobility with min A and min cues in preparation for adls/transfer to 3:1 OT Goal: Miscellaneous Goal #2 - Progress: Goal set today  Visit Information  Last OT Received On: 10/24/12 Assistance Needed: +1    Subjective Data  Subjective: I'm uncomfortable right now Patient Stated Goal: agreeable to OT.  No goal stated   Prior Functioning     Home Living Lives With: Spouse Available Help at Discharge: Family;Available 24 hours/day Type of Home: House Home Access: Stairs to enter [employer] of Steps: 1 Home Layout: One level Bathroom Shower/Tub: Forensic scientist: Handicapped height Home Adaptive Equipment:  (wife has 4 wheel walker) Prior Function Level of Independence: Independent Able to Take Stairs?: Yes Driving: Yes Vocation: Retired Musician: No difficulties Dominant Hand: Right         Vision/Perception     Cognition  Overall Cognitive Status: History  of cognitive impairments - at baseline Arousal/Alertness: Awake/alert Orientation Level: Appears intact for  tasks assessed;Other (comment) Behavior During Session: [facility] for tasks performed Cognition - Other Comments: decreased memory; decreased safety    Extremity/Trunk Assessment Right Upper Extremity Assessment RUE ROM/Strength/Tone: WFL for tasks assessed Left Upper Extremity Assessment LUE ROM/Strength/Tone: WFL for tasks assessed     Mobility Bed Mobility Rolling Left: 4: Min assist;With rail Left Sidelying to Sit: 3: Mod assist;HOB flat;With rails Details for Bed Mobility Assistance: mod cues for technique Transfers Sit to Stand: 3: Mod assist;With upper extremity assist;From bed;From chair/3-in-1;Without upper extremity assist Details for Transfer Assistance: cues for hand positioning:  tends to pull up on walker.  Posterior lean in standing     Shoulder Instructions     Exercise     Balance Static Sitting Balance Static Sitting - Level of Assistance: 6: Modified independent (Device/Increase time) Static Standing Balance Static Standing - Level of Assistance: 3: Mod assist   End of Session OT - End of Session Activity Tolerance: Patient limited by fatigue Patient left: in bed;with call bell/phone within reach;with family/visitor present;with bed alarm set  GO Functional Assessment Tool Used: clinical observation/judgment Functional Limitation: Self care Self Care Current Status (Z6109): At least 40 percent but less than 60 percent impaired, limited or restricted Self Care Goal Status (U0454): At least 20 percent but less than 40 percent impaired, limited or restricted   Dwayne [name] 10/24/2012, 2:11 PM Marica Otter, OTR/L [phone] 10/24/2012

## 2012-10-25 ENCOUNTER — Encounter (HOSPITAL_COMMUNITY): Payer: Medicare Other

## 2012-10-25 DIAGNOSIS — K5900 Constipation, unspecified: Secondary | ICD-10-CM | POA: Diagnosis not present

## 2012-10-25 DIAGNOSIS — I4891 Unspecified atrial fibrillation: Secondary | ICD-10-CM | POA: Diagnosis not present

## 2012-10-25 DIAGNOSIS — I10 Essential (primary) hypertension: Secondary | ICD-10-CM | POA: Diagnosis not present

## 2012-10-25 DIAGNOSIS — T148XXA Other injury of unspecified body region, initial encounter: Secondary | ICD-10-CM | POA: Diagnosis not present

## 2012-10-25 LAB — CBC
HCT: 39.4 % (ref 39.0–52.0)
Hemoglobin: 12.9 g/dL — ABNORMAL LOW (ref 13.0–17.0)
MCH: 29.7 pg (ref 26.0–34.0)
MCHC: 32.7 g/dL (ref 30.0–36.0)
MCV: 90.8 fL (ref 78.0–100.0)
RBC: 4.34 MIL/uL (ref 4.22–5.81)

## 2012-10-25 LAB — BASIC METABOLIC PANEL
BUN: 16 mg/dL (ref 6–23)
CO2: 25 mEq/L (ref 19–32)
Calcium: 9.4 mg/dL (ref 8.4–10.5)
Creatinine, Ser: 0.69 mg/dL (ref 0.50–1.35)
GFR calc non Af Amer: 89 mL/min — ABNORMAL LOW (ref >90)
Glucose, Bld: 95 mg/dL (ref 70–99)

## 2012-10-25 LAB — SURGICAL PCR SCREEN: Staphylococcus aureus: POSITIVE — AB

## 2012-10-25 MED ORDER — MUPIROCIN 2 % EX OINT
TOPICAL_OINTMENT | Freq: Two times a day (BID) | CUTANEOUS | Status: DC
  Administered 2012-10-25: 22:00:00 via NASAL
  Filled 2012-10-25: qty 22

## 2012-10-25 NOTE — Progress Notes (Signed)
Patient ID: Dwayne [name], male   DOB: 1932-05-23, 78 y.o.   MRN: [id number]    INR 1.35 today!!!  Pt sched for prob L4 KP today in IR  We will call for pt when can.

## 2012-10-25 NOTE — Progress Notes (Signed)
Chaplain visited with patient and and his wife...delifhtful couple, married for 29 years but knew each other as children.   Provided spiritual and emotional support and prayed with them before I left.  Will follow up with patient on Friday if he is still here.

## 2012-10-25 NOTE — Progress Notes (Signed)
Patient Name: Dwayne [name] Date of Encounter: 10/25/2012     Principal Problem:  *Compression fracture Active Problems:  CAD, ARTERY BYPASS GRAFT  Atrial fibrillation  Constipation    SUBJECTIVE  For surgery today.  Stable.  INR is down.    CURRENT MEDS    . aspirin  81 mg Oral Daily  . atorvastatin  20 mg Oral q1800  . cyanocobalamin  1,000 mcg Intramuscular Once  . docusate sodium  100 mg Oral BID  . finasteride  5 mg Oral QHS  . fluticasone  2 spray Each Nare Daily  . loratadine  10 mg Oral Daily  . metoprolol tartrate  25 mg Oral BID  . pantoprazole  40 mg Oral Daily  . polyethylene glycol  17 g Oral Daily  . pregabalin  75 mg Oral TID  . sodium chloride  3 mL Intravenous Q12H  . terazosin  5 mg Oral Daily    OBJECTIVE  Filed Vitals:   10/24/12 1525 10/24/12 1741 10/24/12 2153 10/25/12 0411  BP: 124/80 105/76 137/67 135/80  Pulse: 80 78 68 64  Temp: 97.3 F (36.3 C) 98.1 F (36.7 C) 97.7 F (36.5 C) 97.3 F (36.3 C)  TempSrc: Oral Oral Oral Oral  Resp: 18 18 18 20   Height:      Weight:   158 lb 11.7 oz (72 kg)   SpO2: 98% 98% 95%     Intake/Output Summary (Last 24 hours) at 10/25/12 0836 Last data filed at 10/25/12 0521  Gross per 24 hour  Intake    360 ml  Output   1051 ml  Net   -691 ml   Filed Weights   10/22/12 2046 10/23/12 2000 10/24/12 2153  Weight: 156 lb 1.6 oz (70.806 kg) 158 lb 1.1 oz (71.7 kg) 158 lb 11.7 oz (72 kg)    PHYSICAL EXAM  General: Pleasant, NAD. Neuro: Alert and oriented X 3. Moves all extremities spontaneously. Psych: Normal affect. HEENT:  Normal  Neck: Supple without bruits or JVD. Lungs: Minimal ronchii Heart: RRR with some irregularity   (PPM at baseline with atrial fib background.   Abdomen: Soft, non-tender, non-distended, BS + x 4.  Extremities: No clubbing, cyanosis or edema. DP/PT/Radials 2+ and equal bilaterally.  Accessory Clinical Findings  CBC No results found for this basename:  WBC:2,NEUTROABS:2,HGB:2,HCT:2,MCV:2,PLT:2 in the last 72 hours Basic Metabolic Panel No results found for this basename: NA:2,K:2,CL:2,CO2:2,GLUCOSE:2,BUN:2,CREATININE:2,CALCIUM:2,MG:2,PHOS:2 in the last 72 hours Liver Function Tests No results found for this basename: AST:2,ALT:2,ALKPHOS:2,BILITOT:2,PROT:2,ALBUMIN:2 in the last 72 hours No results found for this basename: LIPASE:2,AMYLASE:2 in the last 72 hours Cardiac Enzymes No results found for this basename: CKTOTAL:3,CKMB:3,CKMBINDEX:3,TROPONINI:3 in the last 72 hours BNP No components found with this basename: POCBNP:3 D-Dimer No results found for this basename: DDIMER:2 in the last 72 hours Hemoglobin A1C No results found for this basename: HGBA1C in the last 72 hours Fasting Lipid Panel No results found for this basename: CHOL,HDL,LDLCALC,TRIG,CHOLHDL,LDLDIRECT in the last 72 hours Thyroid Function Tests No results found for this basename: TSH,T4TOTAL,FREET3,T3FREE,THYROIDAB in the last 72 hours    ECG  Pending.  Tracing from the 30th reviewed.    Radiology/Studies  Ct Lumbar Spine Wo Contrast  10/12/2012  *RADIOLOGY REPORT*  Clinical Data: Severe low back pain.  Right hip pain.  Difficulty walking.  L4 fracture.  CT LUMBAR SPINE WITHOUT CONTRAST  Technique:  Multidetector CT imaging of the lumbar spine was performed without intravenous contrast administration. Multiplanar CT image reconstructions were also generated.  Comparison: CT 05/20/2010.  Findings: There is a new L4 compression fracture with 50% low loss of vertebral body height anteriorly.  There is retropulsion of the posterior superior endplate.  Anterolisthesis of L4 on L5 has been present and this mitigates central canal compromise because of retropulsion.  There is a fracture cleavage plane inferior to the superior endplate.  Retropulsion is estimated at 5 mm. Fracture planes extend through the pedicles consistent with a burst fracture although the mechanism of a  burst fracture is typically a fall from height.  The L4 laminae and spinous process appear intact.  Multilevel lumbar spondylosis is present.  Vertebral body height at other levels is similar and there are no other compression fractures identified.  Anterolisthesis of L4 on L5 is grade 1 measuring 8 mm.  Paraspinal soft tissues demonstrate abdominal aortic atherosclerosis and ectasia of the infrarenal abdominal aorta measuring at least 2.5 cm.  There is a mild levoconvex curvature.  The sacrum is intact.  T12-L1:  Facet arthrosis and mild ligamentum flavum redundancy.  No stenosis.  L1-L2:  Shallow circumferential disc bulging.  Short pedicles incidentally noted.  No stenosis.  L2-L3:  Moderate central stenosis associated with broad-based calcified disc bulge and facet hypertrophy/ligamentum flavum redundancy.  Bilateral symmetric foraminal stenosis associated bulging disc and facet disease.  L3-L4:  At least moderate central stenosis is present.  This is multifactorial, associated with broad-based disc bulging, facet arthrosis and ligamentum flavum redundancy.  MRI or CT myelography would probably be useful for further evaluation if decompression is considered.  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  L4-L5:  Severe facet arthrosis.  Severe central stenosis.  Facet hypertrophy and ligamentum flavum redundancy.  Broad-based posterior disc bulging/uncoverage. Severe bilateral foraminal stenosis.  Paravertebral phlegmon is present at L4 compatible with acute or subacute fracture. Sclerosis and cystic change of the spinous process compatible with Baastrup impingement.  Heterotopic bone is present dorsal to the spinous processes.  L5-S1:  Moderate to severe bilateral facet arthrosis.  Broad-based posterior disc bulge which is calcified. Severe left and moderate right foraminal stenosis associated with endplate spurring, bulging disc and facet arthrosis.  IMPRESSION:  1.  L4 fracture with 50% loss  of anterior vertebral body height. The appearance is most compatible with a burst fracture with extension into the pedicles although these are typically observed with fall from height rather than relatively minor or no trauma (stated clinical history).  Mild retropulsion is mitigated by anterolisthesis of L4 on L5. 2.  Multilevel degenerative disease with central stenosis most severe L4-L5.  These results will be called to the ordering clinician or representative by the Radiologist Assistant, and communication documented in the PACS Dashboard.   Original Report Authenticated By: Andreas Newport, M.D.     ASSESSMENT AND PLAN  1,  For kyphoplasty today.  Will get baseline ECG today.  Will need to start warfarin back post op.   Signed, Shawnie Pons MD, [facility], FSCAI

## 2012-10-25 NOTE — Clinical Social Work Note (Addendum)
10/23/12 - LATE ENTRY - CSW talked with patient earlier in the day regarding discharge planning and the recommendation of SNF for ST rehab. Patient indicated that he would be going home. When asked, patient reported that his wife would be visiting later in the day and CSW could return and talk with her.  CSW visited later same date and talked with patient and wife regarding discharge plans. CSW reminded patient that he would be having surgery and asked him if wife or children could care for him. Wife responded that she has Parkinson's and cannot provide care and sons live out of town. After further discussion, patient agreed to rehab as he had with CSW on 10/29.  CSW explained bed search procedures and gave patient and wife SNF lists for [facility].  10/25/12 - CSW provided patient and wife with bed offers. [facility] and 2nd choice is [facility].  CSW also advised that patient will not have procedure today.  CSW will alert [facility] of patient's choice and continue to follow.  CSW made contact with Janie in admissions at [facility] to advise of patient's choice of their facility. CSW advised admissions staff that patient will have procedure Thursday and should be ready for discharge on Friday and CSW will follow-up with patient and wife regarding who can complete admissions paperwork for patient.  Genelle Bal, MSW, LCSW [phone]

## 2012-10-25 NOTE — Progress Notes (Signed)
[name] PROGRESS NOTE  AVANTAE CERCONE AVW:[id number] DOB: 07-04-1932 DOA: 10/17/2012 PCP: Sanda Linger, MD  Assessment/Plan: 1. L4 compression fracture with difficulty walking and pain - -continue pain management.Deveshwar/IR consulted for kyphoplasty.  -INR continuing to slowly trend down-1.3 today for kyphoplasty per IR -SNF for rehab/CSW following -resume coumadin after kyphoplasty this evening if ok with IR #2. Constipation - possibly secondary to patient's recent starting of pain relief medications.  -resolved, continue bowel regimen #3. Atrial fibrillation status post pacemaker placement , Coumadin on hold, resume after kyphoplasty this evening -appreciate cards input #4. CAD status post CABG - denies any chest pain.  #5. BPH - continue present medications.   Code Status: full Family Communication:  Disposition Plan: SNF tomorrow if stable   Consultants:  Cards  IR  Procedures:  NONE  Antibiotics:  NONE  HPI/Subjective:   denies new c/o, NPo for kyphoplasty today  Objective: Filed Vitals:   10/24/12 1525 10/24/12 1741 10/24/12 2153 10/25/12 0411  BP: 124/80 105/76 137/67 135/80  Pulse: 80 78 68 64  Temp: 97.3 F (36.3 C) 98.1 F (36.7 C) 97.7 F (36.5 C) 97.3 F (36.3 C)  TempSrc: Oral Oral Oral Oral  Resp: 18 18 18 20   Height:      Weight:   72 kg (158 lb 11.7 oz)   SpO2: 98% 98% 95%     Intake/Output Summary (Last 24 hours) at 10/25/12 0947 Last data filed at 10/25/12 0521  Gross per 24 hour  Intake    240 ml  Output   1051 ml  Net   -811 ml   Filed Weights   10/22/12 2046 10/23/12 2000 10/24/12 2153  Weight: 70.806 kg (156 lb 1.6 oz) 71.7 kg (158 lb 1.1 oz) 72 kg (158 lb 11.7 oz)    Exam:   General:  A&O x3, in NAD  Cardiovascular: Irreg, irreg.rate controlled  Respiratory: CTA  Abdomen: soft +BS, NT/ND  Ext: no edema/c/c  Data Reviewed: Basic Metabolic Panel:  Lab 10/21/12 8295  NA 138  K 3.6  CL 104  CO2 23   GLUCOSE 94  BUN 22  CREATININE 0.78  CALCIUM 9.2  MG --  PHOS --   Liver Function Tests: No results found for this basename: AST:5,ALT:5,ALKPHOS:5,BILITOT:5,PROT:5,ALBUMIN:5 in the last 168 hours No results found for this basename: LIPASE:5,AMYLASE:5 in the last 168 hours No results found for this basename: AMMONIA:5 in the last 168 hours CBC: No results found for this basename: WBC:5,NEUTROABS:5,HGB:5,HCT:5,MCV:5,PLT:5 in the last 168 hours Cardiac Enzymes: No results found for this basename: CKTOTAL:5,CKMB:5,CKMBINDEX:5,TROPONINI:5 in the last 168 hours BNP (last 3 results) No results found for this basename: PROBNP:3 in the last 8760 hours CBG: No results found for this basename: GLUCAP:5 in the last 168 hours  No results found for this or any previous visit (from the past 240 hour(s)).   Studies: No results found.  Scheduled Meds:    . aspirin  81 mg Oral Daily  . atorvastatin  20 mg Oral q1800  . cyanocobalamin  1,000 mcg Intramuscular Once  . docusate sodium  100 mg Oral BID  . finasteride  5 mg Oral QHS  . fluticasone  2 spray Each Nare Daily  . loratadine  10 mg Oral Daily  . metoprolol tartrate  25 mg Oral BID  . pantoprazole  40 mg Oral Daily  . polyethylene glycol  17 g Oral Daily  . pregabalin  75 mg Oral TID  . sodium chloride  3 mL  Intravenous Q12H  . terazosin  5 mg Oral Daily   Continuous Infusions:   Principal Problem:  *Compression fracture Active Problems:  CAD, ARTERY BYPASS GRAFT  Atrial fibrillation  Constipation    Time spent:    [facility]  [facility] Pager [phone]. If 8PM-8AM, please contact night-coverage at [email or web address], password [facility] 10/25/2012, 9:47 AM  LOS: 8 days

## 2012-10-25 NOTE — Progress Notes (Signed)
Notified by IR that pt procedure to be postponed until tomorrow. Pt resumed on diet until midnight tonight.Dondra Spry

## 2012-10-25 NOTE — Progress Notes (Signed)
Physical Therapy Treatment Patient Details Name: HUGHES WYNDHAM MRN: [id number] DOB: 1932-12-15 Today's Date: 10/25/2012 Time: 4098-1191 PT Time Calculation (min): 29 min  PT Assessment / Plan / Recommendation Comments on Treatment Session  Admitted with back pain and compression fx; for kyphoplasty later today; Pt with continued weakness and fatigue limiting functional mobilty;   Need to seriously consider short term SNF to maximize independence and safety with mobility prior to dc home; Will of course monitor progress post kyphoplasty    Follow Up Recommendations  SNF     Does the patient have the potential to tolerate intense rehabilitation  No, Recommend SNF  Barriers to Discharge        Equipment Recommendations  Rolling walker with 5" wheels;3 in 1 bedside comode    Recommendations for Other Services    Frequency Min 4X/week   Plan Discharge plan remains appropriate    Precautions / Restrictions Precautions Precautions: Back (for comfort) Precaution Comments: instructed in log rolling   Pertinent Vitals/Pain 5-6/10 back pain; repositioned in bed    Mobility  Bed Mobility Bed Mobility: Rolling Right;Rolling Left;Right Sidelying to Sit;Sit to Sidelying Right;Scooting to [facility] Rolling Right: 4: Min assist;With rail Rolling Left: 4: Min assist;With rail Right Sidelying to Sit: 4: Min assist;With rails Sitting - Scoot to [place] of Bed: 4: Min assist Sit to Sidelying Right: 3: Mod assist Scooting to [facility]: 4: Min assist Details for Bed Mobility Assistance: Verbal and tactile cues for log roll technique; Difficulty amintaining sidelie with sit to right sidelying , and required heavy mod assist to get LEs into bed; Good scoot to [facility] with UE pull Transfers Transfers: Sit to Stand;Stand to Sit Sit to Stand: 3: Mod assist;With upper extremity assist;From bed;From chair/3-in-1;Without upper extremity assist Stand to Sit: 3: Mod assist Details for Transfer Assistance: cues for hand  positioning:  tends to pull up on walker.  Posterior lean in standing; Noted decr control of descent with stand to sit Ambulation/Gait Ambulation/Gait Assistance: 4: Min assist Ambulation Distance (Feet):  (March in place, 20-30 paces, at EOB; Pt quite fearful of falling, and so we decided to work on walking in front of bed, just in case) Assistive device: 4-wheeled walker Ambulation/Gait Assistance Details: continued occasional posterior lean; Cued pt to weight shift anteriorly and feel floor through forefoot and toes to combat posterior lean    Exercises     PT Diagnosis:    PT Problem List:   PT Treatment Interventions:     PT Goals Acute Rehab PT Goals Time For Goal Achievement: 10/28/12 Potential to Achieve Goals: Good Pt will go Supine/Side to Sit: with supervision PT Goal: Supine/Side to Sit - Progress: Progressing toward goal Pt will go Sit to Supine/Side: with supervision PT Goal: Sit to Supine/Side - Progress: Progressing toward goal Pt will go Sit to Stand: with supervision PT Goal: Sit to Stand - Progress: Progressing toward goal Pt will go Stand to Sit: with supervision PT Goal: Stand to Sit - Progress: Progressing toward goal Pt will Ambulate: 51 - 150 feet;with min assist PT Goal: Ambulate - Progress: Progressing toward goal (quite slowly)  Visit Information  Last PT Received On: 10/25/12 Assistance Needed: +1    Subjective Data  Subjective: Reports feels quite weak today; looking forward t kyphoplasty procedure Patient Stated Goal: reduced pain   Cognition  Overall Cognitive Status: History of cognitive impairments - at baseline Arousal/Alertness: Awake/alert Orientation Level: Appears intact for tasks assessed Behavior During Session: [facility] for tasks performed Cognition -  Other Comments: decr memory    Balance     End of Session PT - End of Session Equipment Utilized During Treatment: Gait belt Activity Tolerance: Patient limited by fatigue Patient left:  in bed;with call bell/phone within reach;with family/visitor present Nurse Communication: Mobility status   GP     Olen Pel [place], [place] [phone]  10/25/2012, 12:59 PM

## 2012-10-25 NOTE — Progress Notes (Signed)
Patient ID: Dwayne [name], male   DOB: 07/22/1932, 78 y.o.   MRN: [id number]   L4 KP must be rescheduled to 11/7  Orders in chart for am... RN aware.[name] Kitchen

## 2012-10-25 NOTE — Clinical Social Work Placement (Addendum)
Clinical Social Work Department CLINICAL SOCIAL WORK PLACEMENT NOTE 10/25/2012  Patient:  Dwayne [name], Dwayne [name]  Account Number:  [id number] Admit date:  10/17/2012  Clinical Social Worker:  Genelle Bal, LCSW  Date/time:  10/25/2012 01:07 AM  Clinical Social Work is seeking post-discharge placement for this patient at the following level of care:   SKILLED NURSING   (*CSW will update this form in Epic as items are completed)   10/23/2012  Patient/family provided with [facility] Department of Clinical Social Work's list of facilities offering this level of care within the geographic area requested by the patient (or if unable, by the patient's family).  10/23/2012  Patient/family informed of their freedom to choose among providers that offer the needed level of care, that participate in Medicare, Medicaid or managed care program needed by the patient, have an available bed and are willing to accept the patient.    Patient/family informed of [facility]' ownership interest in [facility], as well as of the fact that they are under no obligation to receive care at this facility.  PASARR submitted to EDS on 10/25/2012 PASARR number received from EDS on 10/25/2012  FL2 transmitted to all facilities in geographic area requested by pt/family on  10/24/2012 FL2 transmitted to all facilities within larger geographic area on   Patient informed that his/her managed care company has contracts with or will negotiate with  certain facilities, including the following:     Patient/family informed of bed offers received:  10/25/2012 Patient chooses bed at [facility] Physician recommends and patient chooses bed at    Patient to be transferred to [facility]   Patient to be transferred to facility by ambulance  The following physician request were entered in Epic:   Additional Comments: Aura Fey - [phone] A 10/27/12 - Discharge information forwarded to facility via  [employer] 11/06/12 - Wife was at the bedside and will be transported to [facility] to await husband by family.

## 2012-10-26 ENCOUNTER — Inpatient Hospital Stay (HOSPITAL_COMMUNITY): Payer: Medicare Other

## 2012-10-26 DIAGNOSIS — I4891 Unspecified atrial fibrillation: Secondary | ICD-10-CM | POA: Diagnosis not present

## 2012-10-26 DIAGNOSIS — K5900 Constipation, unspecified: Secondary | ICD-10-CM | POA: Diagnosis not present

## 2012-10-26 DIAGNOSIS — S32009A Unspecified fracture of unspecified lumbar vertebra, initial encounter for closed fracture: Secondary | ICD-10-CM | POA: Diagnosis not present

## 2012-10-26 DIAGNOSIS — T148XXA Other injury of unspecified body region, initial encounter: Secondary | ICD-10-CM | POA: Diagnosis not present

## 2012-10-26 MED ORDER — MIDAZOLAM HCL 2 MG/2ML IJ SOLN
INTRAMUSCULAR | Status: AC | PRN
  Administered 2012-10-26 (×3): 1 mg via INTRAVENOUS

## 2012-10-26 MED ORDER — HYDROMORPHONE HCL PF 1 MG/ML IJ SOLN
INTRAMUSCULAR | Status: AC
  Filled 2012-10-26: qty 2

## 2012-10-26 MED ORDER — SODIUM CHLORIDE 0.9 % IV SOLN: INTRAVENOUS | Status: AC

## 2012-10-26 MED ORDER — TOBRAMYCIN SULFATE 1.2 G IJ SOLR
INTRAMUSCULAR | Status: AC
  Filled 2012-10-26: qty 1.2

## 2012-10-26 MED ORDER — IOHEXOL 300 MG/ML  SOLN: 50.0000 mL | Freq: Once | INTRAMUSCULAR | Status: AC | PRN

## 2012-10-26 MED ORDER — MIDAZOLAM HCL 2 MG/2ML IJ SOLN
INTRAMUSCULAR | Status: AC
  Filled 2012-10-26: qty 4

## 2012-10-26 MED ORDER — CHLORHEXIDINE GLUCONATE CLOTH 2 % EX PADS
6.0000 | MEDICATED_PAD | Freq: Every day | CUTANEOUS | Status: DC
  Administered 2012-10-26 – 2012-10-27 (×2): 6 via TOPICAL

## 2012-10-26 MED ORDER — MUPIROCIN 2 % EX OINT
1.0000 "application " | TOPICAL_OINTMENT | Freq: Two times a day (BID) | CUTANEOUS | Status: DC
  Administered 2012-10-26 – 2012-10-27 (×3): 1 via NASAL
  Filled 2012-10-26: qty 22

## 2012-10-26 MED ORDER — WARFARIN - PHARMACIST DOSING INPATIENT: Freq: Every day | Status: DC

## 2012-10-26 MED ORDER — HYDROMORPHONE HCL PF 1 MG/ML IJ SOLN
INTRAMUSCULAR | Status: AC | PRN
  Administered 2012-10-26: 1 mg

## 2012-10-26 MED ORDER — FENTANYL CITRATE 0.05 MG/ML IJ SOLN
INTRAMUSCULAR | Status: AC | PRN
  Administered 2012-10-26 (×2): 25 ug via INTRAVENOUS

## 2012-10-26 MED ORDER — VANCOMYCIN HCL IN DEXTROSE 1-5 GM/200ML-% IV SOLN
1000.0000 mg | Freq: Once | INTRAVENOUS | Status: AC
  Administered 2012-10-26: 1000 mg via INTRAVENOUS
  Filled 2012-10-26: qty 200

## 2012-10-26 MED ORDER — WARFARIN SODIUM 1 MG PO TABS
1.0000 mg | ORAL_TABLET | Freq: Once | ORAL | Status: AC
  Administered 2012-10-26: 1 mg via ORAL
  Filled 2012-10-26: qty 1

## 2012-10-26 MED ORDER — FENTANYL CITRATE 0.05 MG/ML IJ SOLN
INTRAMUSCULAR | Status: AC
  Filled 2012-10-26: qty 4

## 2012-10-26 NOTE — Progress Notes (Signed)
[name] PROGRESS NOTE  Dwayne [name] ZOX:[id number] DOB: [birth year] DOA: 10/17/2012 PCP: Sanda Linger, MD  Assessment/Plan: 1. L4 compression fracture with difficulty walking and pain - -continue pain management.Deveshwar/IR consulted for kyphoplasty.  -INR continuing to slowly trend down-1.3 today for kyphoplasty per IR today -SNF for rehab/CSW following -resume coumadin after kyphoplasty this evening #2. Constipation - possibly secondary to patient's recent starting of pain relief medications.  -resolved, continue bowel regimen #3. Atrial fibrillation status post pacemaker placement , Coumadin on hold, resume after kyphoplasty this evening -appreciate cards input #4. CAD status post CABG - denies any chest pain.  #5. BPH - continue present medications.   Code Status: full Family Communication:  Disposition Plan: SNF tomorrow if stable   Consultants:  Cards  IR  Procedures:  NONE  Antibiotics:  NONE  HPI/Subjective:   denies new c/o, NPo for kyphoplasty again  Objective: Filed Vitals:   10/25/12 1330 10/25/12 1815 10/25/12 2115 10/26/12 0529  BP: 127/69 135/72 123/73 131/52  Pulse: 84 88 69 70  Temp: 98.5 F (36.9 C) 98.1 F (36.7 C) 98 F (36.7 C) 97.6 F (36.4 C)  TempSrc: Oral Oral Oral Oral  Resp: 20 20 18 18   Height:      Weight:   70.6 kg (155 lb 10.3 oz)   SpO2: 97% 98% 96% 95%    Intake/Output Summary (Last 24 hours) at 10/26/12 0845 Last data filed at 10/26/12 0718  Gross per 24 hour  Intake    360 ml  Output    801 ml  Net   -441 ml   Filed Weights   10/23/12 2000 10/24/12 2153 10/25/12 2115  Weight: 71.7 kg (158 lb 1.1 oz) 72 kg (158 lb 11.7 oz) 70.6 kg (155 lb 10.3 oz)    Exam:   General:  A&O x3, in NAD  Cardiovascular: Irreg, irreg.rate controlled  Respiratory: CTA  Abdomen: soft +BS, NT/ND  Ext: no edema/c/c  Data Reviewed: Basic Metabolic Panel:  Lab 10/25/12 8119 10/21/12 0545  NA 139 138  K 3.7 3.6    CL 103 104  CO2 25 23  GLUCOSE 95 94  BUN 16 22  CREATININE 0.69 0.78  CALCIUM 9.4 9.2  MG -- --  PHOS -- --   Liver Function Tests: No results found for this basename: AST:5,ALT:5,ALKPHOS:5,BILITOT:5,PROT:5,ALBUMIN:5 in the last 168 hours No results found for this basename: LIPASE:5,AMYLASE:5 in the last 168 hours No results found for this basename: AMMONIA:5 in the last 168 hours CBC:  Lab 10/25/12 0845  WBC 8.3  NEUTROABS --  HGB 12.9*  HCT 39.4  MCV 90.8  PLT 397   Cardiac Enzymes: No results found for this basename: CKTOTAL:5,CKMB:5,CKMBINDEX:5,TROPONINI:5 in the last 168 hours BNP (last 3 results) No results found for this basename: PROBNP:3 in the last 8760 hours CBG: No results found for this basename: GLUCAP:5 in the last 168 hours  Recent Results (from the past 240 hour(s))  SURGICAL PCR SCREEN     Status: Abnormal   Collection Time   10/25/12  8:22 AM      Component Value Range Status Comment   MRSA, PCR NEGATIVE  NEGATIVE Final    Staphylococcus aureus POSITIVE (*) NEGATIVE Final      Studies: No results found.  Scheduled Meds:    . aspirin  81 mg Oral Daily  . atorvastatin  20 mg Oral q1800  . cyanocobalamin  1,000 mcg Intramuscular Once  . docusate sodium  100 mg Oral BID  .  finasteride  5 mg Oral QHS  . fluticasone  2 spray Each Nare Daily  . loratadine  10 mg Oral Daily  . metoprolol tartrate  25 mg Oral BID  . mupirocin ointment   Nasal BID  . pantoprazole  40 mg Oral Daily  . polyethylene glycol  17 g Oral Daily  . pregabalin  75 mg Oral TID  . sodium chloride  3 mL Intravenous Q12H  . terazosin  5 mg Oral Daily   Continuous Infusions:   Principal Problem:  *Compression fracture Active Problems:  CAD, ARTERY BYPASS GRAFT  Atrial fibrillation  Constipation    Time spent:    [facility]  [facility] Pager [phone]. If 8PM-8AM, please contact night-coverage at [email or web address], password [facility] 10/26/2012, 8:45 AM   LOS: 9 days

## 2012-10-26 NOTE — ED Notes (Signed)
MD at bedside. 

## 2012-10-26 NOTE — Discharge Summary (Signed)
KYPHOPLASTY/VERTEBROPLASTY DISCHARGE INSTRUCTIONS  Medications: (check all that apply)     Resume all home medications as before procedure.       Resume your (aspirin/Plavix/Coumadin) today.                  Continue your pain medications as prescribed as needed.  Over the next 3-5 days, decrease your pain medication as tolerated.  Over the counter medications (i.e. Tylenol, ibuprofen, and aleve) may be substituted once severe/moderate pain symptoms have subsided.   Wound Care:   Bandages may be removed the day following your procedure.  You may get your incision wet once bandages are removed.  Bandaids may be used to cover the incisions until scab formation.  Topical ointments are optional.    If you develop a fever greater than 101 degrees, have increased skin redness at the incision sites or pus-like oozing from incisions occurring within 1 week of the procedure, contact radiology at [phone] or [phone].    Ice pack to back for 15-20 minutes 2-3 time per day for first 2-3 days post procedure.  The ice will expedite muscle healing and help with the pain from the incisions.   Activity:   Bedrest today with limited activity for 24 hours post procedure..  Use walker to ambulate for 2 weeks.    No driving for 2 weekshours.    Increase your activity as tolerated after bedrest (with assistance if necessary).    Refrain from any strenuous activity or heavy lifting (greater than 10 lbs.).   Follow up:   Contact radiology at [phone] or [phone] if any questions/concerns.    A physician assistant from radiology will contact you in approximately 1 week.    If a biopsy was performed at the time of your procedure, your referring physician should receive the results in usually 2-3 days.

## 2012-10-26 NOTE — ED Notes (Signed)
Moved self back onto bed.  O2 d/c'd

## 2012-10-26 NOTE — Progress Notes (Signed)
PT Cancellation Note  Patient Details Name: CORDON GASSETT MRN: [id number] DOB: Jan 18, 1932   Cancelled Treatment:    Reason Eval/Treat Not Completed: Patient not medically ready;Other (comment) (pt. had L4 kyphoplasty, on bedrest x 3 hours)   Ferman Hamming 10/26/2012, 1:51 PM Weldon Picking PT Acute Rehab Services [phone] Beeper [phone]

## 2012-10-26 NOTE — Progress Notes (Signed)
ANTICOAGULATION CONSULT NOTE - Follow Up Consult  Pharmacy Consult for Coumadin Indication: atrial fibrillation  Allergies  Allergen Reactions  . Clindamycin     REACTION: upset stomach  . Doxycycline Hyclate     REACTION: n/v/d  . Erythromycin     REACTION: n/v/d  . Metronidazole     REACTION: rash, itching  . Penicillins     REACTION: hives  . Sulfonamide Derivatives     REACTION: hives  . Tetracycline     REACTION: n/v/d    Patient Measurements: Height: 5' 7.5" (171.5 cm) Weight: 155 lb 10.3 oz (70.6 kg) IBW/kg (Calculated) : 67.25   Vital Signs: Temp: 97.4 F (36.3 C) (11/07 1430) Temp src: Oral (11/07 1430) BP: 117/61 mmHg (11/07 1430) Pulse Rate: 89  (11/07 1430)  Labs:  Basename 10/26/12 0635 10/25/12 0845 10/25/12 0500 10/24/12 0640  HGB -- 12.9* -- --  HCT -- 39.4 -- --  PLT -- 397 -- --  APTT -- -- -- --  LABPROT 15.9* -- 16.4* 19.0*  INR 1.30 -- 1.35 1.65*  HEPARINUNFRC -- -- -- --  CREATININE -- 0.69 -- --  CKTOTAL -- -- -- --  CKMB -- -- -- --  TROPONINI -- -- -- --    Estimated Creatinine Clearance: 72.4 ml/min (by C-G formula based on Cr of 0.69).   Assessment: 80 YOM on coumadin PTA for afib, which has been on hold sine 10/28 for vertebroplasty which has been done today. INR = 1.3 this morning hgb 12.9, plt 397 on 11/6, no new cbc today.   Home dose: MWF-0.5 half tab (0.5 mg total); All other days-1 tab (1 mg total)   Goal of Therapy:  INR 2-3 Monitor platelets by anticoagulation protocol: Yes   Plan:  - Coumadin 1mg  po x 1 - f/u daily INR  Bayard Hugger, PharmD, BCPS  Clinical Pharmacist  Pager: [phone]  10/26/2012,3:45 PM

## 2012-10-26 NOTE — Procedures (Signed)
S/P L4 balloon kyphoplasty.

## 2012-10-26 NOTE — Progress Notes (Signed)
Pt return from IR. Pt is arousable but drowsy. Pt is alert per baseline. Dressing to back is clean dry and intact. Family to bedside

## 2012-10-27 ENCOUNTER — Encounter (HOSPITAL_COMMUNITY): Payer: Medicare Other

## 2012-10-27 DIAGNOSIS — E538 Deficiency of other specified B group vitamins: Secondary | ICD-10-CM | POA: Diagnosis not present

## 2012-10-27 DIAGNOSIS — M8448XD Pathological fracture, other site, subsequent encounter for fracture with routine healing: Secondary | ICD-10-CM | POA: Diagnosis not present

## 2012-10-27 DIAGNOSIS — Z950 Presence of cardiac pacemaker: Secondary | ICD-10-CM | POA: Diagnosis not present

## 2012-10-27 DIAGNOSIS — I2510 Atherosclerotic heart disease of native coronary artery without angina pectoris: Secondary | ICD-10-CM | POA: Diagnosis not present

## 2012-10-27 DIAGNOSIS — S81809A Unspecified open wound, unspecified lower leg, initial encounter: Secondary | ICD-10-CM | POA: Diagnosis not present

## 2012-10-27 DIAGNOSIS — M810 Age-related osteoporosis without current pathological fracture: Secondary | ICD-10-CM | POA: Diagnosis not present

## 2012-10-27 DIAGNOSIS — S2239XA Fracture of one rib, unspecified side, initial encounter for closed fracture: Secondary | ICD-10-CM | POA: Diagnosis not present

## 2012-10-27 DIAGNOSIS — M6281 Muscle weakness (generalized): Secondary | ICD-10-CM | POA: Diagnosis not present

## 2012-10-27 DIAGNOSIS — M76899 Other specified enthesopathies of unspecified lower limb, excluding foot: Secondary | ICD-10-CM | POA: Diagnosis not present

## 2012-10-27 DIAGNOSIS — I4891 Unspecified atrial fibrillation: Secondary | ICD-10-CM | POA: Diagnosis not present

## 2012-10-27 DIAGNOSIS — R279 Unspecified lack of coordination: Secondary | ICD-10-CM | POA: Diagnosis not present

## 2012-10-27 DIAGNOSIS — T148XXA Other injury of unspecified body region, initial encounter: Secondary | ICD-10-CM | POA: Diagnosis not present

## 2012-10-27 DIAGNOSIS — M8448XA Pathological fracture, other site, initial encounter for fracture: Secondary | ICD-10-CM | POA: Diagnosis not present

## 2012-10-27 DIAGNOSIS — E785 Hyperlipidemia, unspecified: Secondary | ICD-10-CM | POA: Diagnosis not present

## 2012-10-27 DIAGNOSIS — N400 Enlarged prostate without lower urinary tract symptoms: Secondary | ICD-10-CM

## 2012-10-27 DIAGNOSIS — J309 Allergic rhinitis, unspecified: Secondary | ICD-10-CM | POA: Diagnosis not present

## 2012-10-27 DIAGNOSIS — R262 Difficulty in walking, not elsewhere classified: Secondary | ICD-10-CM | POA: Diagnosis not present

## 2012-10-27 DIAGNOSIS — K5900 Constipation, unspecified: Secondary | ICD-10-CM | POA: Diagnosis not present

## 2012-10-27 DIAGNOSIS — M549 Dorsalgia, unspecified: Secondary | ICD-10-CM | POA: Diagnosis not present

## 2012-10-27 DIAGNOSIS — Z951 Presence of aortocoronary bypass graft: Secondary | ICD-10-CM | POA: Diagnosis not present

## 2012-10-27 DIAGNOSIS — Z5189 Encounter for other specified aftercare: Secondary | ICD-10-CM | POA: Diagnosis not present

## 2012-10-27 DIAGNOSIS — I10 Essential (primary) hypertension: Secondary | ICD-10-CM | POA: Diagnosis not present

## 2012-10-27 MED ORDER — WARFARIN SODIUM 1 MG PO TABS
1.0000 mg | ORAL_TABLET | Freq: Once | ORAL | Status: DC
  Filled 2012-10-27: qty 1

## 2012-10-27 MED ORDER — WARFARIN SODIUM 1 MG PO TABS: 1.0000 mg | ORAL_TABLET | Freq: Every day | ORAL | Status: DC

## 2012-10-27 MED ORDER — HYDROCODONE-ACETAMINOPHEN 5-325 MG PO TABS: 1.0000 | ORAL_TABLET | ORAL | Status: DC | PRN

## 2012-10-27 MED ORDER — DOCUSATE SODIUM 100 MG PO CAPS: 100.0000 mg | ORAL_CAPSULE | Freq: Two times a day (BID) | ORAL | Status: DC

## 2012-10-27 NOTE — Progress Notes (Signed)
Occupational Therapy Treatment Patient Details Name: Dwayne [name] MRN: [id number] DOB: 10-16-1932 Today's Date: 10/27/2012 Time: 1191-4782 OT Time Calculation (min): 24 min  OT Assessment / Plan / Recommendation Comments on Treatment Session Pt s/p L4 kyphoplasty on 11/7 and first OOB functional mobility since procedure perfromed this session.  Pt and wife report that they have chosen [facility] for d/c plan.    Follow Up Recommendations  SNF;Supervision/Assistance - 24 hour    Barriers to Discharge       Equipment Recommendations  Rolling walker with 5" wheels;3 in 1 bedside comode    Recommendations for Other Services    Frequency Min 2X/week   Plan Discharge plan remains appropriate    Precautions / Restrictions Precautions Precautions: Back Precaution Comments: instructed in log rolling Restrictions Weight Bearing Restrictions: No   Pertinent Vitals/Pain See vitals    ADL  Upper Body Dressing: Performed;Set up Where Assessed - Upper Body Dressing: Unsupported sitting Lower Body Dressing: Performed;+1 Total assistance Where Assessed - Lower Body Dressing: Unsupported sitting Toilet Transfer: Simulated;Moderate assistance Toilet Transfer Method: Sit to Barista:  (bed) Equipment Used: Gait belt;Rolling walker Transfers/Ambulation Related to ADLs: Mod assist with RW in room with second person bringing chair for safety.  Pt requires max cueing for upright posture and gait sequencing with RW. Pt required rest break x1 after ambulating ~15 ft and knees beginning to buckle from fatigue. ADL Comments: Focus of session on educating pt on back precautions and techniques to minimize pain.  Pt c/o pain in right hip throughout session.     OT Diagnosis:    OT Problem List:   OT Treatment Interventions:     OT Goals ADL Goals Pt Will Transfer to Toilet: with min assist;Stand pivot transfer;3-in-1 ADL Goal: Toilet Transfer - Progress: Progressing toward  goals Miscellaneous OT Goals Miscellaneous OT Goal #1: Pt will maintain static standing balance during adls with min A for 2-3 minutes OT Goal: Miscellaneous Goal #1 - Progress: Progressing toward goals Miscellaneous OT Goal #2: Pt will perform bed mobility with min A and min cues in preparation for adls/transfer to 3:1 OT Goal: Miscellaneous Goal #2 - Progress: Progressing toward goals  Visit Information  Last OT Received On: 10/27/12 Assistance Needed: +2    Subjective Data      Prior Functioning       Cognition  Overall Cognitive Status: History of cognitive impairments - at baseline Arousal/Alertness: Awake/alert Orientation Level: Appears intact for tasks assessed Behavior During Session: [facility] for tasks performed Cognition - Other Comments: decr memory    Mobility  Shoulder Instructions Bed Mobility Bed Mobility: Rolling Right;Rolling Left;Right Sidelying to Sit;Sit to Sidelying Right;Scooting to [facility] Rolling Right: 4: Min assist;With rail Right Sidelying to Sit: With rails;3: Mod assist Sitting - Scoot to Edge of Bed: 4: Min assist Sit to Supine: 1: +2 Total assist Sit to Supine: Patient Percentage: 60% Sit to Sidelying Right: 1: +2 Total assist Sit to Sidelying Right: Patient Percentage: 60% Scooting to HOB: 1: +2 Total assist Scooting to [facility]: Patient Percentage: 50% Details for Bed Mobility Assistance: verbal and manual cues for log rolling technique.  Transitional movements with increasing pain; pain tends to level off as pt. completes transition.  Needed heavy mod assist to get LEs back into bed. Transfers Transfers: Sit to Stand;Stand to Sit Sit to Stand: 3: Mod assist;With upper extremity assist;From bed;Without upper extremity assist;From chair/3-in-1 Stand to Sit: 3: Mod assist;With upper extremity assist;With armrests;To bed;To chair/3-in-1 Details for  Transfer Assistance: Pt. has persistent posterior lean in transition to stand.  Needed manual facilitation to  correct.  Multiple vc's for technique and for safety.       Exercises      Balance Balance Balance Assessed: Yes Static Sitting Balance Static Sitting - Balance Support: Feet supported Static Sitting - Level of Assistance: 6: Modified independent (Device/Increase time) Static Sitting - Comment/# of Minutes: 5 min Static Standing Balance Static Standing - Balance Support: Bilateral upper extremity supported Static Standing - Level of Assistance: 3: Mod assist   End of Session OT - End of Session Equipment Utilized During Treatment: Gait belt Activity Tolerance: Patient limited by pain;Patient limited by fatigue Patient left: in bed;with family/visitor present;with call bell/phone within reach Nurse Communication: Mobility status;Patient requests pain meds  GO    10/27/2012 Cipriano Mile OTR/L Pager [phone] Office [phone]  Cipriano Mile 10/27/2012, 4:02 PM

## 2012-10-27 NOTE — Progress Notes (Signed)
10/27/2012 3:43 PM  Pt D/C to [facility]. Report called to Oleu.  Pt transported via EMS.  Eunice Blase

## 2012-10-27 NOTE — Discharge Summary (Signed)
Physician Discharge Summary  Patient ID: Dwayne [name] MRN: [id number] DOB/AGE: 07-20-1932 78 y.o.  Admit date: 10/17/2012 Discharge date: 10/27/2012  Primary Care Physician:  Sanda Linger, MD Cardiology: Dr.Stucker, Corinda [facility]  Disposition and Follow-up:  1. PCP in 1 week   Discharge Diagnoses:   Principal Problem:  L4Compression fracture s/p Kyphoplasty Active Problems:  CAD, ARTERY BYPASS GRAFT  Atrial fibrillation  Constipation  BPH      Medication List     As of 10/27/2012 12:16 PM    TAKE these medications         alendronate 70 MG tablet   Commonly known as: FOSAMAX   Take 70 mg by mouth every 7 (seven) days. On Thursdays; Take with a full glass of water on an empty stomach.      AMBIEN 10 MG tablet   Generic drug: zolpidem   Take 10 mg by mouth at bedtime as needed. For insomnia      aspirin 81 MG tablet   Take 81 mg by mouth daily.      CITRACAL CALCIUM+D 600-40-500 MG-MG-UNIT Tb24   Generic drug: Calcium-Magnesium-Vitamin D   Take 1 tablet by mouth 2 (two) times daily. Taking 650 daily      cyanocobalamin 1000 MCG/ML injection   Commonly known as: (VITAMIN B-12)   Inject 1,000 mcg into the muscle every 21 ( twenty-one) days.      docusate sodium 100 MG capsule   Commonly known as: COLACE   Take 1 capsule (100 mg total) by mouth 2 (two) times daily.      fexofenadine 180 MG tablet   Commonly known as: ALLEGRA   Take 180 mg by mouth at bedtime.      finasteride 5 MG tablet   Commonly known as: PROSCAR   Take 5 mg by mouth at bedtime.      fluticasone 50 MCG/ACT nasal spray   Commonly known as: FLONASE   Place 2 sprays into the nose daily.      HYDROcodone-acetaminophen 5-325 MG per tablet   Commonly known as: NORCO/VICODIN   Take 1 tablet by mouth every 4 (four) hours as needed for pain. For pain      metoprolol tartrate 25 MG tablet   Commonly known as: LOPRESSOR   Take 1 tablet (25 mg total) by mouth 2 (two) times daily.      NITROSTAT  0.4 MG SL tablet   Generic drug: nitroGLYCERIN   place 1 tablet under the tongue every 5 minutes for UP TO 3 doses if needed for angina as directed b      omeprazole 40 MG capsule   Commonly known as: PRILOSEC   Take 40 mg by mouth daily.      polyethylene glycol packet   Commonly known as: MIRALAX / GLYCOLAX   Take 17 g by mouth daily.      pregabalin 75 MG capsule   Commonly known as: LYRICA   Take 75 mg by mouth 3 (three) times daily.      PROLIA 60 MG/ML Soln injection   Generic drug: denosumab   Inject 60 mg into the skin every 3 (three) months.      rosuvastatin 10 MG tablet   Commonly known as: CRESTOR   Take 1 tablet (10 mg total) by mouth daily.      terazosin 5 MG capsule   Commonly known as: HYTRIN   Take 1 capsule (5 mg total) by mouth daily.      warfarin  1 MG tablet   Commonly known as: COUMADIN   Take 1 tablet (1 mg total) by mouth daily. MWF-0.5 half tab (0.5 mg total); All other days-1 tab (1 mg total) for Goal INR 2-3, next INR check 11/9         Kyphoplasty instructions: Continue your pain medications as prescribed as needed. Over the next 3-5 days, decrease your pain medication as tolerated. Over the counter medications (i.e. Tylenol, ibuprofen, and aleve) may be substituted once severe/moderate pain symptoms have subsided.  Wound Care:  Bandages may be removed the day following your procedure. You may get your incision wet once bandages are removed. Bandaids may be used to cover the incisions until scab formation. Topical ointments are optional. If you develop a fever greater than 101 degrees, have increased skin redness at the incision sites or pus-like oozing from incisions occurring within 1 week of the procedure, contact radiology at [phone] or [phone]. Ice pack to back for 15-20 minutes 2-3 time per day for first 2-3 days post procedure. The ice will expedite muscle healing and help with the pain from the incisions. Activity:  Bedrest today with  limited activity for 24 hours post procedure.. Use walker to ambulate for 2 weeks.  No driving for 2 weekshours. Increase your activity as tolerated after bedrest (with assistance if necessary). Refrain from any strenuous activity or heavy lifting (greater than 10 lbs.). Follow up:  Contact radiology at [phone] or [phone] if any questions/concerns.   Consults:  Dr.Deweshwar, Interventional radiology Dr.Stuckey [facility]   Significant Diagnostic Studies:  Ir Kyphoplasty Or Sacroplasty  10/26/2012  *RADIOLOGY REPORT*  Clinical Data:  Severe new onset pain in the lower lumbar region secondary to compression fracture at L4.  LUMBAR KYPHOPLASTY AT L4  Comparison: CT scan of the lumbosacral spine of 10/11/2012.  Following a full explanation of the procedure along with the potential associated complications, an informed witnessed consent was obtained.  The patient was placed prone on the fluoroscopic table.  The skin overlying the lumbar region was then prepped and draped in the usual sterile fashion.  The left pedicle at L4 was then infiltrated with 0.25% bupivacaine.  Using biplane intermittent fluoroscopy, an 11-gauge Jamshidi needle was then advanced without difficulty into the posterior third at L4.  This was then exchanged out for the Kyphon advanced osteo introducer system comprising a working cannula and a Kyphon osteo drill over a Kyphon osteo bone pin.  This combination was then advanced until the tip of the Kyphon osteo drill was in the posterior third at L4.  At this time the bone pin was removed.  In a medial trajectory the combination was then advanced until the tip of the Kyphon working cannula was in the posterior third at L4. The osteo drill was removed and a core sample sent for pathologic analysis.  Through the working cannula, a Kyphon bone biopsy device was then advanced within 5 mm of the anterior aspect of L4.  Using a 20 ml syringe, a core biopsy was obtained and also sent  for pathologic analysis.  Through the working cannula, a Kyphon inflatable bone tamp 20 x 3 was then advanced and positioned with the distal marker 5 mm from the anterior aspect of L4.  This balloon was then expanded with contrast using the Kyphon inflation syringe device via microtubing. Inflation was continued until there was apposition with the superior endplate.  At this time methylmethacrylate mixture was reconstituted with Tobramycin in the Kyphon bone mixing system.  This was then loaded onto Kyphon bone fillers.  The balloon was deflated and removed followed by the instillation of 4.5 bone filler equivalents of methylmethacrylate mixture. Excellent filling was obtained in the AP and lateral projections.  Crossing of the midline by the methylmethacrylate mixture was seen.  There was no extrusion into disc spaces or posteriorly into the spinal canal.  No paraspinous venous contamination was seen.  The working cannula and the bone filler were removed.  Hemostasis was achieved at the skin entry sites.  She tolerated the procedure well.  There were no acute complications.  Medications utilized: Versed 5 mg IV.  Fentanyl 50 mcg IV. Dilaudid 1 mg IV.  IMPRESSION: 1.  Status post fluoroscopic-guided needle placement for deep core bone biopsy at L4. 2.  Status post vertebral body augmentation for painful compression fracture at L4 using the balloon kyphoplasty technique.   Original Report Authenticated By: Julieanne Cotton, M.D.     Brief H and P: 78 year-old male with history of CAD status post CABG, atrial fibrillation status post pacemaker placement and on Coumadin, BPH, hypertension who has had a recent L4 compression fracture 2 weeks ago and was placed on pain relief medication and is scheduled to have kyphoplasty done next week by Dr. Corliss Skains, interventional neuroradiologist. Patient has been having difficulty moving bowels for last 5 days and had come to the ER where patient had fecal disimpaction.  Patient after which was found to have difficulty walking because of low back pain has been admitted for further management. Denies any chest pain shortness of breath nausea vomiting fever chills.   Procedure: L4 Kyphoplasty 11/7  Hospital Course:  1. L4 compression fracture with difficulty walking and pain - -continue pain management. Finally had L4 kyphoplasty by Dr.Deweshwar on 11/7 after his INR slowly drifted down after holding his coumadin -Has been ambulating very little with PT and will go to [facility] for rehab -adequate pain control has been achieved with Vicodin -coumadin resumed after  kyphoplasty yesterday  #2. Constipation - possibly secondary to patient's recent starting of pain relief medications.  -resolved, continue bowel regimen with miralax/colace #3. Atrial fibrillation status post pacemaker placement , Coumadin restarted, INR subtherapeutic at discharge, INR will need to be rechecked 11/9, goal INR 2-3, seen and followed by Dr.Stuckey during hospitalization  -appreciate cards input  #4. CAD status post CABG - stable #5. BPH - continue present medications.  Discharge condition: stable Discharge diet: heart healthy   Time spent on Discharge:  Signed: Ramanda Paules [facility]  10/27/2012, 12:16 PM

## 2012-10-27 NOTE — Progress Notes (Signed)
ANTICOAGULATION CONSULT NOTE - Follow Up Consult  Pharmacy Consult for Coumadin Indication: atrial fibrillation  Allergies  Allergen Reactions  . Clindamycin     REACTION: upset stomach  . Doxycycline Hyclate     REACTION: n/v/d  . Erythromycin     REACTION: n/v/d  . Metronidazole     REACTION: rash, itching  . Penicillins     REACTION: hives  . Sulfonamide Derivatives     REACTION: hives  . Tetracycline     REACTION: n/v/d    Patient Measurements: Height: 5' 7.5" (171.5 cm) Weight: 154 lb 8.7 oz (70.1 kg) IBW/kg (Calculated) : 67.25    Vital Signs: Temp: 97.8 F (36.6 C) (11/08 0511) Temp src: Oral (11/08 0511) BP: 125/71 mmHg (11/08 0511) Pulse Rate: 68  (11/08 0511)  Labs:  Dwayne [name] 10/27/12 0605 10/26/12 0635 10/25/12 0845 10/25/12 0500  HGB -- -- 12.9* --  HCT -- -- 39.4 --  PLT -- -- 397 --  APTT -- -- -- --  LABPROT 17.3* 15.9* -- 16.4*  INR 1.46 1.30 -- 1.35  HEPARINUNFRC -- -- -- --  CREATININE -- -- 0.69 --  CKTOTAL -- -- -- --  CKMB -- -- -- --  TROPONINI -- -- -- --    Estimated Creatinine Clearance: 72.4 ml/min (by C-G formula based on Cr of 0.69).  Assessment: Patient is a 78 y.o M with coumadin resumed yesterday s/p vertebroplasty for hx Afib.  INR is trending up to 1.46 today.    Goal of Therapy:  INR 2-3    Plan:  1) Coumadin 1mg  PO x1 today  Dwayne [name] P 10/27/2012,9:07 AM

## 2012-10-27 NOTE — Progress Notes (Signed)
Physical Therapy Treatment Patient Details Name: Dwayne [name] MRN: [id number] DOB: 26-Jul-1932 Today's Date: 10/27/2012 Time: 4401-0272 PT Time Calculation (min): 25 min  PT Assessment / Plan / Recommendation Comments on Treatment Session  Pt. underwent kyphoplasty of L4 yesterday and began post procedure mobilization today.  He needs 24 hour care and 5x/week PT  therefore is going to SNF before home.      Follow Up Recommendations  SNF     Does the patient have the potential to tolerate intense rehabilitation  No, Recommend SNF  Barriers to Discharge        Equipment Recommendations  Rolling walker with 5" wheels;3 in 1 bedside comode    Recommendations for Other Services    Frequency Min 4X/week   Plan Discharge plan remains appropriate    Precautions / Restrictions Precautions Precautions: Back Precaution Comments: instructed in log rolling Restrictions Weight Bearing Restrictions: No   Pertinent Vitals/Pain Pain 6/10 in right hip area; RN made aware and provided vicodin for pt.    Mobility  Bed Mobility Bed Mobility: Rolling Right;Rolling Left;Right Sidelying to Sit;Sit to Sidelying Right;Scooting to [facility] Rolling Right: 4: Min assist;With rail Right Sidelying to Sit: With rails;3: Mod assist Sitting - Scoot to Edge of Bed: 4: Min assist Sit to Supine: 1: +2 Total assist Sit to Supine: Patient Percentage: 60% Sit to Sidelying Right: 1: +2 Total assist Sit to Sidelying Right: Patient Percentage: 60% Scooting to HOB: 1: +2 Total assist Scooting to [facility]: Patient Percentage: 50% Details for Bed Mobility Assistance: verbal and manual cues for log rolling technique.  Transitional movements with increasing pain; pain tends to level off as pt. completes transition.  Needed heavy mod assist to get LEs back into bed. Transfers Transfers: Sit to Stand;Stand to Sit Sit to Stand: 3: Mod assist;With upper extremity assist;From bed;Without upper extremity assist;From  chair/3-in-1 Stand to Sit: 3: Mod assist;With upper extremity assist;With armrests;To bed;To chair/3-in-1 Details for Transfer Assistance: Pt. has persistent posterior lean in transition to stand.  Needed manual facilitation to correct.  Multiple vc's for technique and for safety. Ambulation/Gait Ambulation/Gait Assistance: 1: +2 Total assist Ambulation/Gait: Patient Percentage: 70% Ambulation Distance (Feet): 30 Feet Assistive device: Rolling walker Ambulation/Gait Assistance Details: Pt. with persistent posterior tendance and needs manual facilitation to correct.  Tends to place feet too far forward in RW and needs multiple technique and RW/foot placement cues.  Second person did assist for balance and also brought along chair for pt. to rest in at halfway point in walk. Gait Pattern: Step-through pattern;Trunk flexed Gait velocity: slowed Stairs: No    Exercises     PT Diagnosis:    PT Problem List:   PT Treatment Interventions:     PT Goals Acute Rehab PT Goals PT Goal: Supine/Side to Sit - Progress: Progressing toward goal PT Goal: Sit to Supine/Side - Progress: Progressing toward goal PT Goal: Sit to Stand - Progress: Progressing toward goal PT Goal: Stand to Sit - Progress: Progressing toward goal PT Goal: Ambulate - Progress: Progressing toward goal  Visit Information  Last PT Received On: 10/27/12 Assistance Needed: +2    Subjective Data  Subjective: "I need to do some more of this (walking) before I go to [employer]  Overall Cognitive Status: History of cognitive impairments - at baseline Arousal/Alertness: Awake/alert Orientation Level: Appears intact for tasks assessed Behavior During Session: [facility] for tasks performed Cognition - Other Comments: decr memory    Balance     End  of Session PT - End of Session Equipment Utilized During Treatment: Gait belt Activity Tolerance: Patient limited by fatigue;Patient limited by pain Patient left: in bed;with  call bell/phone within reach;with family/visitor present Nurse Communication: Mobility status   GP     Ferman Hamming 10/27/2012, 1:41 PM Weldon Picking PT Acute Rehab Services [phone] Beeper [phone]

## 2012-10-27 NOTE — Progress Notes (Signed)
10/26/12 0900  Clinical Encounter Type  Visited With Patient and family together  Visit Type Follow-up  Referral From Chaplain  Advance Directives (For Healthcare)  Pre-existing out of facility DNR order (yellow form or pink MOST form) No   I visited the patient and wife. I had a brief visit because I was paged for a trauma patient. Veryl Speak

## 2012-10-27 NOTE — Progress Notes (Signed)
Subjective: L4 KP 11/7 Pt better today; still sore from procedure Moving better per wife  Objective: Vital signs in last 24 hours: Temp:  [97.3 F (36.3 C)-98.7 F (37.1 C)] 97.8 F (36.6 C) (11/08 0511) Pulse Rate:  [57-89] 68  (11/08 0511) Resp:  [10-18] 16  (11/08 0511) BP: (98-147)/(61-91) 125/71 mmHg (11/08 0511) SpO2:  [94 %-100 %] 97 % (11/08 0511) Weight:  [154 lb 8.7 oz (70.1 kg)] 154 lb 8.7 oz (70.1 kg) (11/07 2128) Last BM Date: 10/25/12  Intake/Output from previous day: 11/07 0701 - 11/08 0700 In: 360 [P.O.:360] Out: 1050 [Urine:1050] Intake/Output this shift:    PE: site of KP: nt; no bleeding; clean and dry Moves all 4s Afeb; VSS  Lab Results:   [facility] 10/25/12 0845  WBC 8.3  HGB 12.9*  HCT 39.4  PLT 397   BMET  Basename 10/25/12 0845  NA 139  K 3.7  CL 103  CO2 25  GLUCOSE 95  BUN 16  CREATININE 0.69  CALCIUM 9.4   PT/INR  Basename 10/27/12 0605 10/26/12 0635  LABPROT 17.3* 15.9*  INR 1.46 1.30   ABG No results found for this basename: PHART:2,PCO2:2,PO2:2,HCO3:2 in the last 72 hours  Studies/Results: Ir Kyphoplasty Or Sacroplasty  10/26/2012  *RADIOLOGY REPORT*  Clinical Data:  Severe new onset pain in the lower lumbar region secondary to compression fracture at L4.  LUMBAR KYPHOPLASTY AT L4  Comparison: CT scan of the lumbosacral spine of 10/11/2012.  Following a full explanation of the procedure along with the potential associated complications, an informed witnessed consent was obtained.  The patient was placed prone on the fluoroscopic table.  The skin overlying the lumbar region was then prepped and draped in the usual sterile fashion.  The left pedicle at L4 was then infiltrated with 0.25% bupivacaine.  Using biplane intermittent fluoroscopy, an 11-gauge Jamshidi needle was then advanced without difficulty into the posterior third at L4.  This was then exchanged out for the Kyphon advanced osteo introducer system comprising a  working cannula and a Kyphon osteo drill over a Kyphon osteo bone pin.  This combination was then advanced until the tip of the Kyphon osteo drill was in the posterior third at L4.  At this time the bone pin was removed.  In a medial trajectory the combination was then advanced until the tip of the Kyphon working cannula was in the posterior third at L4. The osteo drill was removed and a core sample sent for pathologic analysis.  Through the working cannula, a Kyphon bone biopsy device was then advanced within 5 mm of the anterior aspect of L4.  Using a 20 ml syringe, a core biopsy was obtained and also sent for pathologic analysis.  Through the working cannula, a Kyphon inflatable bone tamp 20 x 3 was then advanced and positioned with the distal marker 5 mm from the anterior aspect of L4.  This balloon was then expanded with contrast using the Kyphon inflation syringe device via microtubing. Inflation was continued until there was apposition with the superior endplate.  At this time methylmethacrylate mixture was reconstituted with Tobramycin in the Kyphon bone mixing system.  This was then loaded onto Kyphon bone fillers.  The balloon was deflated and removed followed by the instillation of 4.5 bone filler equivalents of methylmethacrylate mixture. Excellent filling was obtained in the AP and lateral projections.  Crossing of the midline by the methylmethacrylate mixture was seen.  There was no extrusion into disc spaces or posteriorly  into the spinal canal.  No paraspinous venous contamination was seen.  The working cannula and the bone filler were removed.  Hemostasis was achieved at the skin entry sites.  She tolerated the procedure well.  There were no acute complications.  Medications utilized: Versed 5 mg IV.  Fentanyl 50 mcg IV. Dilaudid 1 mg IV.  IMPRESSION: 1.  Status post fluoroscopic-guided needle placement for deep core bone biopsy at L4. 2.  Status post vertebral body augmentation for painful  compression fracture at L4 using the balloon kyphoplasty technique.   Original Report Authenticated By: Julieanne Cotton, M.D.     Anti-infectives: Anti-infectives     Start     Dose/Rate Route Frequency Ordered Stop   10/26/12 1029   tobramycin (NEBCIN) 1.2 G powder     Comments: LAFFAN, EILEEN: cabinet override         10/26/12 1029 10/26/12 2244          Assessment/Plan: s/p * No surgery found *   LOS: 10 days   Lumbar 4 Kyphoplasty 11/7 Doing well  Ngoc Daughtridge A 10/27/2012

## 2012-10-30 ENCOUNTER — Other Ambulatory Visit (HOSPITAL_COMMUNITY): Payer: Self-pay | Admitting: Interventional Radiology

## 2012-10-30 ENCOUNTER — Encounter (HOSPITAL_COMMUNITY): Payer: Medicare Other

## 2012-10-30 DIAGNOSIS — IMO0002 Reserved for concepts with insufficient information to code with codable children: Secondary | ICD-10-CM

## 2012-11-01 ENCOUNTER — Encounter (HOSPITAL_COMMUNITY): Payer: Medicare Other

## 2012-11-03 ENCOUNTER — Encounter (HOSPITAL_COMMUNITY): Payer: Medicare Other

## 2012-11-06 ENCOUNTER — Encounter (HOSPITAL_COMMUNITY): Payer: Medicare Other

## 2012-11-08 ENCOUNTER — Encounter (HOSPITAL_COMMUNITY): Payer: Medicare Other

## 2012-11-09 ENCOUNTER — Ambulatory Visit (HOSPITAL_COMMUNITY)
Admission: RE | Admit: 2012-11-09 | Discharge: 2012-11-09 | Disposition: A | Discharge status: Home / Self Care | Payer: Medicare Other | Source: Ambulatory Visit | Attending: Interventional Radiology | Admitting: Interventional Radiology

## 2012-11-09 DIAGNOSIS — IMO0002 Reserved for concepts with insufficient information to code with codable children: Secondary | ICD-10-CM

## 2012-11-09 DIAGNOSIS — Z5189 Encounter for other specified aftercare: Secondary | ICD-10-CM | POA: Diagnosis not present

## 2012-11-09 DIAGNOSIS — M8448XA Pathological fracture, other site, initial encounter for fracture: Secondary | ICD-10-CM | POA: Diagnosis not present

## 2012-11-10 ENCOUNTER — Encounter (HOSPITAL_COMMUNITY): Payer: Medicare Other

## 2012-11-13 ENCOUNTER — Encounter (HOSPITAL_COMMUNITY): Payer: Medicare Other

## 2012-11-15 ENCOUNTER — Encounter (HOSPITAL_COMMUNITY): Payer: Medicare Other

## 2012-11-17 ENCOUNTER — Encounter (HOSPITAL_COMMUNITY): Payer: Medicare Other

## 2012-11-20 ENCOUNTER — Encounter (HOSPITAL_COMMUNITY): Payer: Medicare Other

## 2012-11-22 ENCOUNTER — Encounter (HOSPITAL_COMMUNITY): Payer: Medicare Other

## 2012-11-24 ENCOUNTER — Encounter (HOSPITAL_COMMUNITY): Payer: Medicare Other

## 2012-11-24 DIAGNOSIS — S91009A Unspecified open wound, unspecified ankle, initial encounter: Secondary | ICD-10-CM | POA: Diagnosis not present

## 2012-11-27 ENCOUNTER — Encounter (HOSPITAL_COMMUNITY): Payer: Medicare Other

## 2012-11-29 ENCOUNTER — Encounter (HOSPITAL_COMMUNITY): Payer: Medicare Other

## 2012-11-29 DIAGNOSIS — M810 Age-related osteoporosis without current pathological fracture: Secondary | ICD-10-CM | POA: Diagnosis not present

## 2012-11-29 DIAGNOSIS — I10 Essential (primary) hypertension: Secondary | ICD-10-CM | POA: Diagnosis not present

## 2012-11-29 DIAGNOSIS — I4891 Unspecified atrial fibrillation: Secondary | ICD-10-CM | POA: Diagnosis not present

## 2012-11-29 DIAGNOSIS — M8448XA Pathological fracture, other site, initial encounter for fracture: Secondary | ICD-10-CM | POA: Diagnosis not present

## 2012-11-29 DIAGNOSIS — N400 Enlarged prostate without lower urinary tract symptoms: Secondary | ICD-10-CM | POA: Diagnosis not present

## 2012-11-29 DIAGNOSIS — K5900 Constipation, unspecified: Secondary | ICD-10-CM | POA: Diagnosis not present

## 2012-11-30 ENCOUNTER — Ambulatory Visit: Payer: Medicare Other | Admitting: Internal Medicine

## 2012-12-01 ENCOUNTER — Encounter (HOSPITAL_COMMUNITY): Payer: Medicare Other

## 2012-12-04 ENCOUNTER — Encounter (HOSPITAL_COMMUNITY): Payer: Medicare Other

## 2012-12-06 ENCOUNTER — Encounter (HOSPITAL_COMMUNITY): Payer: Medicare Other

## 2012-12-06 DIAGNOSIS — M4800 Spinal stenosis, site unspecified: Secondary | ICD-10-CM | POA: Diagnosis not present

## 2012-12-06 DIAGNOSIS — M159 Polyosteoarthritis, unspecified: Secondary | ICD-10-CM | POA: Diagnosis not present

## 2012-12-06 DIAGNOSIS — I4891 Unspecified atrial fibrillation: Secondary | ICD-10-CM | POA: Diagnosis not present

## 2012-12-07 DIAGNOSIS — M76899 Other specified enthesopathies of unspecified lower limb, excluding foot: Secondary | ICD-10-CM | POA: Diagnosis not present

## 2012-12-08 ENCOUNTER — Encounter: Payer: Self-pay | Admitting: Cardiology

## 2012-12-08 ENCOUNTER — Ambulatory Visit: Payer: Self-pay | Admitting: Cardiology

## 2012-12-08 ENCOUNTER — Encounter (HOSPITAL_COMMUNITY): Payer: Medicare Other

## 2012-12-08 DIAGNOSIS — Z7901 Long term (current) use of anticoagulants: Secondary | ICD-10-CM

## 2012-12-08 DIAGNOSIS — I4891 Unspecified atrial fibrillation: Secondary | ICD-10-CM

## 2012-12-08 LAB — POCT INR: INR: 2.1

## 2012-12-11 ENCOUNTER — Encounter (HOSPITAL_COMMUNITY): Payer: Medicare Other

## 2012-12-11 ENCOUNTER — Other Ambulatory Visit: Payer: Self-pay | Admitting: *Deleted

## 2012-12-11 ENCOUNTER — Telehealth: Payer: Self-pay | Admitting: *Deleted

## 2012-12-11 DIAGNOSIS — M76899 Other specified enthesopathies of unspecified lower limb, excluding foot: Secondary | ICD-10-CM | POA: Diagnosis not present

## 2012-12-11 DIAGNOSIS — M810 Age-related osteoporosis without current pathological fracture: Secondary | ICD-10-CM | POA: Diagnosis not present

## 2012-12-11 DIAGNOSIS — M19049 Primary osteoarthritis, unspecified hand: Secondary | ICD-10-CM | POA: Diagnosis not present

## 2012-12-11 DIAGNOSIS — M1710 Unilateral primary osteoarthritis, unspecified knee: Secondary | ICD-10-CM | POA: Diagnosis not present

## 2012-12-11 MED ORDER — WARFARIN SODIUM 1 MG PO TABS: ORAL_TABLET | ORAL | Status: DC

## 2012-12-11 NOTE — Telephone Encounter (Signed)
Pt called requesting refill on Warfarin 1 mg and to send to [facility]. Refill done as  requested

## 2012-12-13 ENCOUNTER — Encounter (HOSPITAL_COMMUNITY): Payer: Medicare Other

## 2012-12-15 ENCOUNTER — Ambulatory Visit: Payer: Self-pay | Admitting: Internal Medicine

## 2012-12-15 ENCOUNTER — Encounter (HOSPITAL_COMMUNITY): Payer: Medicare Other

## 2012-12-15 DIAGNOSIS — Z7901 Long term (current) use of anticoagulants: Secondary | ICD-10-CM

## 2012-12-15 DIAGNOSIS — I4891 Unspecified atrial fibrillation: Secondary | ICD-10-CM

## 2012-12-15 LAB — POCT INR: INR: 2.5

## 2012-12-18 ENCOUNTER — Encounter (HOSPITAL_COMMUNITY): Payer: Medicare Other

## 2012-12-20 ENCOUNTER — Encounter (HOSPITAL_COMMUNITY): Payer: Medicare Other

## 2012-12-21 ENCOUNTER — Encounter: Payer: Self-pay | Admitting: Internal Medicine

## 2012-12-21 ENCOUNTER — Ambulatory Visit (INDEPENDENT_AMBULATORY_CARE_PROVIDER_SITE_OTHER): Payer: Medicare Other | Admitting: Internal Medicine

## 2012-12-21 VITALS — BP 112/74 | HR 71 | Temp 97.8°F | Resp 16 | Wt 158.5 lb

## 2012-12-21 DIAGNOSIS — D518 Other vitamin B12 deficiency anemias: Secondary | ICD-10-CM | POA: Diagnosis not present

## 2012-12-21 DIAGNOSIS — I10 Essential (primary) hypertension: Secondary | ICD-10-CM | POA: Diagnosis not present

## 2012-12-21 DIAGNOSIS — T148XXA Other injury of unspecified body region, initial encounter: Secondary | ICD-10-CM

## 2012-12-21 DIAGNOSIS — I4891 Unspecified atrial fibrillation: Secondary | ICD-10-CM

## 2012-12-21 DIAGNOSIS — IMO0002 Reserved for concepts with insufficient information to code with codable children: Secondary | ICD-10-CM

## 2012-12-21 MED ORDER — CYANOCOBALAMIN 1000 MCG/ML IJ SOLN
1000.0000 ug | Freq: Once | INTRAMUSCULAR | Status: AC
  Administered 2012-12-21: 1000 ug via INTRAMUSCULAR

## 2012-12-21 NOTE — Progress Notes (Signed)
  Subjective:    Patient ID: Dwayne [name], male    DOB: 1932-08-11, 79 y.o.   MRN: [id number]  Hypertension This is a chronic problem. The current episode started more than 1 year ago. The problem has been gradually improving since onset. The problem is controlled. Pertinent negatives include no anxiety, blurred vision, chest pain, headaches, malaise/fatigue, neck pain, orthopnea, palpitations, peripheral edema, PND, shortness of breath or sweats. Past treatments include beta blockers and alpha 1 blockers. The current treatment provides moderate improvement. Compliance problems include exercise and diet.       Review of Systems  Constitutional: Negative for fever, chills, malaise/fatigue, diaphoresis, activity change, appetite change, fatigue and unexpected weight change.  HENT: Negative.  Negative for neck pain.   Eyes: Negative.  Negative for blurred vision.  Respiratory: Negative for cough, choking, shortness of breath, wheezing and stridor.   Cardiovascular: Negative for chest pain, palpitations, orthopnea, leg swelling and PND.  Gastrointestinal: Negative for nausea, vomiting, abdominal pain, diarrhea, constipation and blood in stool.  Genitourinary: Negative.   Musculoskeletal: Positive for back pain. Negative for myalgias, joint swelling, arthralgias and gait problem.  Skin: Negative.   Neurological: Negative.  Negative for headaches.  Hematological: Negative for adenopathy. Does not bruise/bleed easily.  Psychiatric/Behavioral: Negative.        Objective:   Physical Exam  Vitals reviewed. Constitutional: He is oriented to person, place, and time. He appears well-developed and well-nourished.  Non-toxic appearance. He has a sickly appearance (wheelchair bound). He does not appear ill. No distress.  HENT:  Head: Normocephalic and atraumatic.  Eyes: Conjunctivae normal are normal. Right eye exhibits no discharge. Left eye exhibits no discharge. No scleral icterus.  Neck: Normal  range of motion. Neck supple. No JVD present. No tracheal deviation present. No thyromegaly present.  Cardiovascular: Normal rate, regular rhythm, normal heart sounds and intact distal pulses.  Exam reveals no gallop and no friction rub.   No murmur heard. Pulmonary/Chest: Effort normal and breath sounds normal. No stridor. No respiratory distress. He has no wheezes. He has no rales. He exhibits no tenderness.  Abdominal: Soft. Bowel sounds are normal. He exhibits no distension and no mass. There is no tenderness. There is no rebound and no guarding.  Musculoskeletal: Normal range of motion. He exhibits no edema and no tenderness.  Lymphadenopathy:    He has no cervical adenopathy.  Neurological: He is oriented to person, place, and time.  Skin: Skin is dry. No rash noted. He is not diaphoretic. No erythema. No pallor.  Psychiatric: He has a normal mood and affect. His behavior is normal. Judgment and thought content normal.      Lab Results  Component Value Date   WBC 8.3 10/25/2012   HGB 12.9* 10/25/2012   HCT 39.4 10/25/2012   PLT 397 10/25/2012   GLUCOSE 95 10/25/2012   CHOL 122 02/22/2012   TRIG 94.0 02/22/2012   HDL 45.00 02/22/2012   LDLCALC 58 02/22/2012   ALT 20 02/22/2012   AST 23 02/22/2012   NA 139 10/25/2012   K 3.7 10/25/2012   CL 103 10/25/2012   CREATININE 0.69 10/25/2012   BUN 16 10/25/2012   CO2 25 10/25/2012   TSH 0.90 02/22/2012   INR 2.5 12/15/2012   HGBA1C 6.2 03/14/2012      Assessment & Plan:

## 2012-12-21 NOTE — Assessment & Plan Note (Signed)
His BP is well controlled 

## 2012-12-21 NOTE — Assessment & Plan Note (Signed)
He is getting pain relief with his current meds

## 2012-12-21 NOTE — Patient Instructions (Signed)

## 2012-12-21 NOTE — Assessment & Plan Note (Signed)
He has good rate and rhythm control 

## 2012-12-22 ENCOUNTER — Encounter (HOSPITAL_COMMUNITY): Payer: Medicare Other

## 2012-12-22 DIAGNOSIS — M76899 Other specified enthesopathies of unspecified lower limb, excluding foot: Secondary | ICD-10-CM | POA: Diagnosis not present

## 2012-12-25 ENCOUNTER — Encounter (HOSPITAL_COMMUNITY): Payer: Medicare Other

## 2012-12-27 ENCOUNTER — Encounter (HOSPITAL_COMMUNITY): Payer: Medicare Other

## 2012-12-29 ENCOUNTER — Ambulatory Visit: Payer: Self-pay | Admitting: Cardiology

## 2012-12-29 ENCOUNTER — Encounter (HOSPITAL_COMMUNITY): Payer: Medicare Other

## 2012-12-29 DIAGNOSIS — I4891 Unspecified atrial fibrillation: Secondary | ICD-10-CM

## 2012-12-29 DIAGNOSIS — Z7901 Long term (current) use of anticoagulants: Secondary | ICD-10-CM

## 2013-01-01 ENCOUNTER — Encounter (HOSPITAL_COMMUNITY): Payer: Medicare Other

## 2013-01-03 ENCOUNTER — Encounter (HOSPITAL_COMMUNITY): Payer: Medicare Other

## 2013-01-05 ENCOUNTER — Encounter (HOSPITAL_COMMUNITY): Payer: Medicare Other

## 2013-01-08 ENCOUNTER — Encounter (HOSPITAL_COMMUNITY): Payer: Medicare Other

## 2013-01-10 ENCOUNTER — Encounter (HOSPITAL_COMMUNITY): Payer: Medicare Other

## 2013-01-10 DIAGNOSIS — H26499 Other secondary cataract, unspecified eye: Secondary | ICD-10-CM | POA: Diagnosis not present

## 2013-01-11 ENCOUNTER — Ambulatory Visit (INDEPENDENT_AMBULATORY_CARE_PROVIDER_SITE_OTHER): Payer: Medicare Other | Admitting: *Deleted

## 2013-01-11 DIAGNOSIS — D518 Other vitamin B12 deficiency anemias: Secondary | ICD-10-CM | POA: Diagnosis not present

## 2013-01-11 MED ORDER — CYANOCOBALAMIN 1000 MCG/ML IJ SOLN
1000.0000 ug | Freq: Once | INTRAMUSCULAR | Status: AC
  Administered 2013-01-11: 1000 ug via INTRAMUSCULAR

## 2013-01-12 ENCOUNTER — Encounter (HOSPITAL_COMMUNITY): Payer: Medicare Other

## 2013-01-15 ENCOUNTER — Encounter (HOSPITAL_COMMUNITY): Payer: Medicare Other

## 2013-01-17 ENCOUNTER — Other Ambulatory Visit: Payer: Self-pay | Admitting: Cardiology

## 2013-01-17 ENCOUNTER — Encounter (HOSPITAL_COMMUNITY): Payer: Medicare Other

## 2013-01-17 NOTE — Telephone Encounter (Signed)
New problem:   Warfarin  1 mg .   [facility]

## 2013-01-19 ENCOUNTER — Encounter (HOSPITAL_COMMUNITY): Payer: Medicare Other

## 2013-01-19 ENCOUNTER — Ambulatory Visit (INDEPENDENT_AMBULATORY_CARE_PROVIDER_SITE_OTHER): Payer: Medicare Other | Admitting: *Deleted

## 2013-01-19 DIAGNOSIS — Z7901 Long term (current) use of anticoagulants: Secondary | ICD-10-CM

## 2013-01-19 DIAGNOSIS — I4891 Unspecified atrial fibrillation: Secondary | ICD-10-CM

## 2013-01-19 LAB — POCT INR: INR: 1.7

## 2013-01-19 MED ORDER — WARFARIN SODIUM 1 MG PO TABS: ORAL_TABLET | ORAL | Status: DC

## 2013-01-19 NOTE — Patient Instructions (Signed)
Reviewed medication compliance, patient uses med box, pt fills this box himself

## 2013-01-22 ENCOUNTER — Encounter (HOSPITAL_COMMUNITY): Payer: Medicare Other

## 2013-01-24 ENCOUNTER — Encounter (HOSPITAL_COMMUNITY): Payer: Medicare Other

## 2013-01-26 ENCOUNTER — Encounter (HOSPITAL_COMMUNITY): Payer: Medicare Other

## 2013-01-26 DIAGNOSIS — M76899 Other specified enthesopathies of unspecified lower limb, excluding foot: Secondary | ICD-10-CM | POA: Diagnosis not present

## 2013-01-29 ENCOUNTER — Encounter (HOSPITAL_COMMUNITY): Payer: Medicare Other

## 2013-01-30 ENCOUNTER — Encounter: Payer: Self-pay | Admitting: *Deleted

## 2013-01-31 ENCOUNTER — Encounter (HOSPITAL_COMMUNITY): Payer: Medicare Other

## 2013-02-02 ENCOUNTER — Encounter (HOSPITAL_COMMUNITY): Payer: Medicare Other

## 2013-02-05 ENCOUNTER — Encounter (HOSPITAL_COMMUNITY): Payer: Medicare Other

## 2013-02-06 DIAGNOSIS — M810 Age-related osteoporosis without current pathological fracture: Secondary | ICD-10-CM | POA: Diagnosis not present

## 2013-02-06 DIAGNOSIS — Z79899 Other long term (current) drug therapy: Secondary | ICD-10-CM | POA: Diagnosis not present

## 2013-02-07 ENCOUNTER — Encounter (HOSPITAL_COMMUNITY): Payer: Medicare Other

## 2013-02-09 ENCOUNTER — Encounter (HOSPITAL_COMMUNITY): Payer: Medicare Other

## 2013-02-09 ENCOUNTER — Ambulatory Visit (INDEPENDENT_AMBULATORY_CARE_PROVIDER_SITE_OTHER): Payer: Medicare Other

## 2013-02-09 DIAGNOSIS — I4891 Unspecified atrial fibrillation: Secondary | ICD-10-CM

## 2013-02-09 DIAGNOSIS — Z7901 Long term (current) use of anticoagulants: Secondary | ICD-10-CM | POA: Diagnosis not present

## 2013-02-09 LAB — POCT INR: INR: 2.4

## 2013-02-12 ENCOUNTER — Encounter (HOSPITAL_COMMUNITY): Payer: Medicare Other

## 2013-02-12 ENCOUNTER — Encounter: Payer: Self-pay | Admitting: Cardiology

## 2013-02-12 ENCOUNTER — Ambulatory Visit (INDEPENDENT_AMBULATORY_CARE_PROVIDER_SITE_OTHER): Payer: Medicare Other | Admitting: Cardiology

## 2013-02-12 VITALS — BP 144/82 | HR 68 | Ht 70.0 in | Wt 165.0 lb

## 2013-02-12 DIAGNOSIS — I10 Essential (primary) hypertension: Secondary | ICD-10-CM

## 2013-02-12 DIAGNOSIS — I4891 Unspecified atrial fibrillation: Secondary | ICD-10-CM

## 2013-02-12 DIAGNOSIS — I25810 Atherosclerosis of coronary artery bypass graft(s) without angina pectoris: Secondary | ICD-10-CM | POA: Diagnosis not present

## 2013-02-12 DIAGNOSIS — I340 Nonrheumatic mitral (valve) insufficiency: Secondary | ICD-10-CM

## 2013-02-12 DIAGNOSIS — E785 Hyperlipidemia, unspecified: Secondary | ICD-10-CM

## 2013-02-12 DIAGNOSIS — IMO0002 Reserved for concepts with insufficient information to code with codable children: Secondary | ICD-10-CM

## 2013-02-12 DIAGNOSIS — T148XXA Other injury of unspecified body region, initial encounter: Secondary | ICD-10-CM

## 2013-02-12 DIAGNOSIS — I059 Rheumatic mitral valve disease, unspecified: Secondary | ICD-10-CM

## 2013-02-12 DIAGNOSIS — Z7901 Long term (current) use of anticoagulants: Secondary | ICD-10-CM

## 2013-02-12 NOTE — Patient Instructions (Signed)
Your physician wants you to follow-up in: Dr Jens Som in 6 MONTHS (previous pt of Dr Riley Kill). You will receive a reminder letter in the mail two months in advance. If you don't receive a letter, please call our office to schedule the follow-up appointment.  Your physician recommends that you continue on your current medications as directed. Please refer to the Current Medication list given to you today.

## 2013-02-13 ENCOUNTER — Ambulatory Visit: Payer: Medicare Other

## 2013-02-13 DIAGNOSIS — I340 Nonrheumatic mitral (valve) insufficiency: Secondary | ICD-10-CM | POA: Insufficient documentation

## 2013-02-13 NOTE — Progress Notes (Signed)
HPI:  Dwayne [name] is in for follow up.  He is doing quite a bit better.  He is not having any chest pain.  He had a fracture with subsequent back surgery and apparently was at [facility] for rehab although I was unaware of him being there. None of this appears in EPIC so not sure where it occurred.  He is doing some better and is followed in Coumadin clinic.  He denies any chest pain.  Feels good overall.  Meds reviewed.    Current Outpatient Prescriptions  Medication Sig Dispense Refill  . aspirin 81 MG tablet Take 81 mg by mouth daily.        . Calcium-Magnesium-Vitamin D (CITRACAL CALCIUM+D) 600-40-500 MG-MG-UNIT TB24 Take 1 tablet by mouth 2 (two) times daily. Taking 650 daily      . cyanocobalamin (,VITAMIN B-12,) 1000 MCG/ML injection Inject 1,000 mcg into the muscle every 21 ( twenty-one) days.       [name] Kitchen docusate sodium (COLACE) 100 MG capsule Take 1 capsule (100 mg total) by mouth 2 (two) times daily.  60 capsule  0  . fexofenadine (ALLEGRA) 180 MG tablet Take 180 mg by mouth at bedtime.       . finasteride (PROSCAR) 5 MG tablet Take 5 mg by mouth at bedtime.      . fluticasone (FLONASE) 50 MCG/ACT nasal spray Place 2 sprays into the nose daily.  16 g  11  . HYDROcodone-acetaminophen (NORCO/VICODIN) 5-325 MG per tablet Take 1 tablet by mouth every 4 (four) hours as needed for pain. For pain  30 tablet  0  . metoprolol tartrate (LOPRESSOR) 25 MG tablet Take 1 tablet (25 mg total) by mouth 2 (two) times daily.  180 tablet  6  . NITROSTAT 0.4 MG SL tablet place 1 tablet under the tongue every 5 minutes for UP TO 3 doses if needed for angina as directed b  100 tablet  PRN  . omeprazole (PRILOSEC) 40 MG capsule Take 40 mg by mouth daily.        . polyethylene glycol (MIRALAX / GLYCOLAX) packet Take 17 g by mouth daily.      . pregabalin (LYRICA) 75 MG capsule Take 75 mg by mouth 3 (three) times daily.       [name] Kitchen PROLIA 60 MG/ML SOLN injection Inject 60 mg into the skin every 6 (six) months.       .  rosuvastatin (CRESTOR) 10 MG tablet Take 1 tablet (10 mg total) by mouth daily.  90 tablet  3  . terazosin (HYTRIN) 5 MG capsule Take 1 capsule (5 mg total) by mouth daily.  90 capsule  1  . warfarin (COUMADIN) 1 MG tablet Take as directed by coumadin clinic  90 tablet  0  . zolpidem (AMBIEN) 10 MG tablet Take 10 mg by mouth at bedtime as needed. For insomnia       No current facility-administered medications for this visit.    Allergies  Allergen Reactions  . Clindamycin     REACTION: upset stomach  . Doxycycline Hyclate     REACTION: n/v/d  . Erythromycin     REACTION: n/v/d  . Metronidazole     REACTION: rash, itching  . Penicillins     REACTION: hives  . Sulfonamide Derivatives     REACTION: hives  . Tetracycline     REACTION: n/v/d    Past Medical History  Diagnosis Date  . Atrial fibrillation     on chronic coumadin  .  Hyperlipidemia   . Bronchiectasis without acute exacerbation   . Disorders of diaphragm   . Plantar fascial fibromatosis   . Epistaxis   . Unspecified sinusitis (chronic)   . Allergic rhinitis, cause unspecified   . Spinal stenosis, unspecified region other than cervical   . Hypertension   . Coronary artery disease     s/p CABG 1993, s/p PCI of SVG to OM '03  . Tachy-brady syndrome     s/p Medtronic PPM '07  . Myocardial infarction   . Pneumonia     " multiple times "  . Shortness of breath   . GERD (gastroesophageal reflux disease)   . Arthritis     Past Surgical History  Procedure Laterality Date  . Coronary artery bypass graft      LIMA to LAD, SVG to OM, SVG to left circumflex, SVG to PD/PLSA  . Pacemaker insertion      2007, Medtronic dual-chamber  . Kidney cyst removal    . Doppler echocardiography  2011  . Cholecystectomy      Family History  Problem Relation Age of Onset  . Cancer Neg Hx   . Heart disease Neg Hx   . Stroke Neg Hx     History   Social History  . Marital Status: Married    Spouse Name: N/A    Number  of Children: 4  . Years of Education: N/A   Occupational History  .     Social History Main Topics  . Smoking status: Never Smoker   . Smokeless tobacco: Never Used  . Alcohol Use: No  . Drug Use: No  . Sexually Active: No   Other Topics Concern  . Not on file   Social History Narrative  . No narrative on file    ROS: Please see the HPI.  All other systems reviewed and negative.  PHYSICAL EXAM:  BP 144/82  Pulse 68  Ht 5\' 10"  (1.778 m)  Wt 165 lb (74.844 kg)  BMI 23.68 kg/m2  SpO2 96%  General: Delightful older gentleman in no distress.   Head:  Normocephalic and atraumatic. Neck: no JVD Lungs: Clear to auscultation and percussion. Heart: Normal S1 and S2.  I do not hear much of a murmur at present.   Pulses: Pulses normal in all 4 extremities. Extremities: No clubbing or cyanosis. No sig edema noted.   Neurologic: Alert and oriented x 3.  EKG:  V pacing.  Underlying atrial fib.    ASSESSMENT AND PLAN:

## 2013-02-13 NOTE — Assessment & Plan Note (Signed)
Relatively well controlled at present.

## 2013-02-13 NOTE — Assessment & Plan Note (Signed)
Last lipid was satisfactory.  Should likely have at next follow up visit.

## 2013-02-13 NOTE — Assessment & Plan Note (Signed)
Has good compliance with coumadin clinic.

## 2013-02-13 NOTE — Assessment & Plan Note (Signed)
Improved at present. 

## 2013-02-13 NOTE — Assessment & Plan Note (Signed)
Chronic issue.  Rate controlled on beta blocker at this point in time.

## 2013-02-13 NOTE — Assessment & Plan Note (Signed)
Mild moderate by prior echo.  Not clinically detectable on exam.  Suggest repeat echo at next year office visit.

## 2013-02-14 ENCOUNTER — Encounter (HOSPITAL_COMMUNITY): Payer: Medicare Other

## 2013-02-14 ENCOUNTER — Ambulatory Visit (INDEPENDENT_AMBULATORY_CARE_PROVIDER_SITE_OTHER): Payer: Medicare Other

## 2013-02-14 DIAGNOSIS — D518 Other vitamin B12 deficiency anemias: Secondary | ICD-10-CM | POA: Diagnosis not present

## 2013-02-14 MED ORDER — CYANOCOBALAMIN 1000 MCG/ML IJ SOLN: 1000.0000 ug | Freq: Once | INTRAMUSCULAR | Status: DC

## 2013-02-14 MED ORDER — CYANOCOBALAMIN 500 MCG/0.1ML NA SOLN: NASAL | Status: DC

## 2013-02-16 ENCOUNTER — Encounter (HOSPITAL_COMMUNITY): Payer: Medicare Other

## 2013-02-19 ENCOUNTER — Encounter (HOSPITAL_COMMUNITY): Payer: Medicare Other

## 2013-02-21 ENCOUNTER — Encounter (HOSPITAL_COMMUNITY): Payer: Medicare Other

## 2013-02-23 ENCOUNTER — Encounter (HOSPITAL_COMMUNITY): Payer: Medicare Other

## 2013-02-26 ENCOUNTER — Encounter (HOSPITAL_COMMUNITY): Payer: Medicare Other

## 2013-02-26 DIAGNOSIS — H905 Unspecified sensorineural hearing loss: Secondary | ICD-10-CM | POA: Diagnosis not present

## 2013-02-28 ENCOUNTER — Encounter (HOSPITAL_COMMUNITY): Payer: Medicare Other

## 2013-03-02 ENCOUNTER — Encounter (HOSPITAL_COMMUNITY): Payer: Medicare Other

## 2013-03-05 ENCOUNTER — Encounter (HOSPITAL_COMMUNITY): Payer: Medicare Other

## 2013-03-07 ENCOUNTER — Encounter (HOSPITAL_COMMUNITY): Payer: Medicare Other

## 2013-03-08 ENCOUNTER — Other Ambulatory Visit: Payer: Self-pay | Admitting: *Deleted

## 2013-03-08 MED ORDER — TERAZOSIN HCL 5 MG PO CAPS: 5.0000 mg | ORAL_CAPSULE | Freq: Every day | ORAL | Status: DC

## 2013-03-08 MED ORDER — FINASTERIDE 5 MG PO TABS: 5.0000 mg | ORAL_TABLET | Freq: Every day | ORAL | Status: DC

## 2013-03-08 NOTE — Telephone Encounter (Signed)
Pt requesting refill of Terazosin and Finasteride sent to [facility].

## 2013-03-09 ENCOUNTER — Ambulatory Visit (INDEPENDENT_AMBULATORY_CARE_PROVIDER_SITE_OTHER): Payer: Medicare Other

## 2013-03-09 ENCOUNTER — Encounter (HOSPITAL_COMMUNITY): Payer: Medicare Other

## 2013-03-09 DIAGNOSIS — I4891 Unspecified atrial fibrillation: Secondary | ICD-10-CM

## 2013-03-09 DIAGNOSIS — Z7901 Long term (current) use of anticoagulants: Secondary | ICD-10-CM

## 2013-03-12 ENCOUNTER — Encounter (HOSPITAL_COMMUNITY): Payer: Medicare Other

## 2013-03-12 DIAGNOSIS — M19079 Primary osteoarthritis, unspecified ankle and foot: Secondary | ICD-10-CM | POA: Diagnosis not present

## 2013-03-12 DIAGNOSIS — M19049 Primary osteoarthritis, unspecified hand: Secondary | ICD-10-CM | POA: Diagnosis not present

## 2013-03-12 DIAGNOSIS — M5137 Other intervertebral disc degeneration, lumbosacral region: Secondary | ICD-10-CM | POA: Diagnosis not present

## 2013-03-12 DIAGNOSIS — M810 Age-related osteoporosis without current pathological fracture: Secondary | ICD-10-CM | POA: Diagnosis not present

## 2013-03-12 DIAGNOSIS — Z79899 Other long term (current) drug therapy: Secondary | ICD-10-CM | POA: Diagnosis not present

## 2013-03-14 ENCOUNTER — Encounter (HOSPITAL_COMMUNITY): Payer: Medicare Other

## 2013-03-15 DIAGNOSIS — E559 Vitamin D deficiency, unspecified: Secondary | ICD-10-CM | POA: Diagnosis not present

## 2013-03-16 ENCOUNTER — Encounter (HOSPITAL_COMMUNITY): Payer: Medicare Other

## 2013-03-19 ENCOUNTER — Encounter (HOSPITAL_COMMUNITY): Payer: Medicare Other

## 2013-03-21 ENCOUNTER — Encounter (HOSPITAL_COMMUNITY): Payer: Medicare Other

## 2013-03-23 ENCOUNTER — Encounter (HOSPITAL_COMMUNITY): Payer: Medicare Other

## 2013-03-26 ENCOUNTER — Encounter (HOSPITAL_COMMUNITY): Payer: Medicare Other

## 2013-03-28 ENCOUNTER — Encounter (HOSPITAL_COMMUNITY): Payer: Medicare Other

## 2013-03-30 ENCOUNTER — Encounter (HOSPITAL_COMMUNITY): Payer: Medicare Other

## 2013-03-30 ENCOUNTER — Ambulatory Visit (INDEPENDENT_AMBULATORY_CARE_PROVIDER_SITE_OTHER): Payer: Medicare Other | Admitting: *Deleted

## 2013-03-30 DIAGNOSIS — I4891 Unspecified atrial fibrillation: Secondary | ICD-10-CM | POA: Diagnosis not present

## 2013-03-30 DIAGNOSIS — Z7901 Long term (current) use of anticoagulants: Secondary | ICD-10-CM | POA: Diagnosis not present

## 2013-04-02 ENCOUNTER — Encounter (HOSPITAL_COMMUNITY): Payer: Medicare Other

## 2013-04-04 ENCOUNTER — Encounter (HOSPITAL_COMMUNITY): Payer: Medicare Other

## 2013-04-06 ENCOUNTER — Encounter (HOSPITAL_COMMUNITY): Payer: Medicare Other

## 2013-04-09 ENCOUNTER — Encounter (HOSPITAL_COMMUNITY): Payer: Medicare Other

## 2013-04-11 ENCOUNTER — Encounter (HOSPITAL_COMMUNITY): Payer: Medicare Other

## 2013-04-11 ENCOUNTER — Encounter: Payer: Self-pay | Admitting: Internal Medicine

## 2013-04-11 ENCOUNTER — Ambulatory Visit (INDEPENDENT_AMBULATORY_CARE_PROVIDER_SITE_OTHER): Payer: Medicare Other | Admitting: Internal Medicine

## 2013-04-11 ENCOUNTER — Ambulatory Visit (INDEPENDENT_AMBULATORY_CARE_PROVIDER_SITE_OTHER): Payer: Medicare Other | Admitting: *Deleted

## 2013-04-11 VITALS — BP 149/55 | HR 86 | Ht 70.0 in | Wt 161.0 lb

## 2013-04-11 DIAGNOSIS — Z7901 Long term (current) use of anticoagulants: Secondary | ICD-10-CM | POA: Diagnosis not present

## 2013-04-11 DIAGNOSIS — Z950 Presence of cardiac pacemaker: Secondary | ICD-10-CM

## 2013-04-11 DIAGNOSIS — I4891 Unspecified atrial fibrillation: Secondary | ICD-10-CM | POA: Diagnosis not present

## 2013-04-11 LAB — PACEMAKER DEVICE OBSERVATION
AL IMPEDENCE PM: 580 Ohm
ATRIAL PACING PM: 30
BAMS-0001: 175 {beats}/min
BATTERY VOLTAGE: 2.7 V
BRDY-0004RV: 130 {beats}/min
VENTRICULAR PACING PM: 71

## 2013-04-11 NOTE — Assessment & Plan Note (Signed)
His Medtronic single-chamber pacemaker is working normally. He has approximately one and a 1/2 years of battery longevity.

## 2013-04-11 NOTE — Progress Notes (Signed)
HPI Mr. Piascik returns today for followup. He is a very pleasant 79 year old man with a history of chronic atrial fibrillation and complete heart block status post permanent pacemaker insertion. In the interim, he has been stable. He did have some problems with bursitis in his right hip. He has undergone treatment and is improving. He is using a walker. He denies syncope, chest pain, shortness of breath, peripheral edema, or any recent falls. Allergies  Allergen Reactions  . Clindamycin     REACTION: upset stomach  . Doxycycline Hyclate     REACTION: n/v/d  . Erythromycin     REACTION: n/v/d  . Metronidazole     REACTION: rash, itching  . Penicillins     REACTION: hives  . Sulfonamide Derivatives     REACTION: hives  . Tetracycline     REACTION: n/v/d     Current Outpatient Prescriptions  Medication Sig Dispense Refill  . aspirin 81 MG tablet Take 81 mg by mouth daily.        . Calcium-Magnesium-Vitamin D (CITRACAL CALCIUM+D) 600-40-500 MG-MG-UNIT TB24 Take 1 tablet by mouth 2 (two) times daily. Taking 650 daily      . cyanocobalamin (,VITAMIN B-12,) 1000 MCG/ML injection Inject 1,000 mcg into the muscle every 21 ( twenty-one) days.       . Cyanocobalamin 500 MCG/0.1ML SOLN 1 spray once weekly  1 Bottle  0  . docusate sodium (COLACE) 100 MG capsule Take 1 capsule (100 mg total) by mouth 2 (two) times daily.  60 capsule  0  . fexofenadine (ALLEGRA) 180 MG tablet Take 180 mg by mouth at bedtime.       . finasteride (PROSCAR) 5 MG tablet Take 1 tablet (5 mg total) by mouth at bedtime.  90 tablet  2  . fluticasone (FLONASE) 50 MCG/ACT nasal spray Place 2 sprays into the nose daily.  16 g  11  . HYDROcodone-acetaminophen (NORCO/VICODIN) 5-325 MG per tablet Take 1 tablet by mouth every 4 (four) hours as needed for pain. For pain  30 tablet  0  . metoprolol tartrate (LOPRESSOR) 25 MG tablet Take 1 tablet (25 mg total) by mouth 2 (two) times daily.  180 tablet  6  . NITROSTAT 0.4 MG SL tablet  place 1 tablet under the tongue every 5 minutes for UP TO 3 doses if needed for angina as directed b  100 tablet  PRN  . omeprazole (PRILOSEC) 40 MG capsule Take 40 mg by mouth daily.        . polyethylene glycol (MIRALAX / GLYCOLAX) packet Take 17 g by mouth daily.      . pregabalin (LYRICA) 75 MG capsule Take 75 mg by mouth 3 (three) times daily.       [name] Kitchen PROLIA 60 MG/ML SOLN injection Inject 60 mg into the skin every 6 (six) months.       . rosuvastatin (CRESTOR) 10 MG tablet Take 1 tablet (10 mg total) by mouth daily.  90 tablet  3  . terazosin (HYTRIN) 5 MG capsule Take 1 capsule (5 mg total) by mouth daily.  90 capsule  2  . warfarin (COUMADIN) 1 MG tablet Take as directed by coumadin clinic  90 tablet  0  . zolpidem (AMBIEN) 10 MG tablet Take 10 mg by mouth at bedtime as needed. For insomnia       Current Facility-Administered Medications  Medication Dose Route Frequency Provider Last Rate Last Dose  . cyanocobalamin ((VITAMIN B-12)) injection 1,000 mcg  1,000 mcg  Intramuscular Once [name], MD         Past Medical History  Diagnosis Date  . Atrial fibrillation     on chronic coumadin  . Hyperlipidemia   . Bronchiectasis without acute exacerbation   . Disorders of diaphragm   . Plantar fascial fibromatosis   . Epistaxis   . Unspecified sinusitis (chronic)   . Allergic rhinitis, cause unspecified   . Spinal stenosis, unspecified region other than cervical   . Hypertension   . Coronary artery disease     s/p CABG 1993, s/p PCI of SVG to OM '03  . Tachy-brady syndrome     s/p Medtronic PPM '07  . Myocardial infarction   . Pneumonia     " multiple times "  . Shortness of breath   . GERD (gastroesophageal reflux disease)   . Arthritis     ROS:   All systems reviewed and negative except as noted in the HPI.   Past Surgical History  Procedure Laterality Date  . Coronary artery bypass graft      LIMA to LAD, SVG to OM, SVG to left circumflex, SVG to PD/PLSA  .  Pacemaker insertion      2007, Medtronic dual-chamber  . Kidney cyst removal    . Doppler echocardiography  2011  . Cholecystectomy       Family History  Problem Relation Age of Onset  . Cancer Neg Hx   . Heart disease Neg Hx   . Stroke Neg Hx      History   Social History  . Marital Status: Married    Spouse Name: N/A    Number of Children: 4  . Years of Education: N/A   Occupational History  .     Social History Main Topics  . Smoking status: Never Smoker   . Smokeless tobacco: Never Used  . Alcohol Use: No  . Drug Use: No  . Sexually Active: No   Other Topics Concern  . Not on file   Social History Narrative  . No narrative on file     BP 149/55  Pulse 86  Ht 5\' 10"  (1.778 m)  Wt 161 lb (73.029 kg)  BMI 23.1 kg/m2  Physical Exam:  frail appearing 79 year old man,NAD HEENT: Unremarkable Neck:  7 cm JVD, no thyromegally Back:  No CVA tenderness Lungs:  Clear with no wheezes, rales, or rhonchi. No increased work of breathing HEART:  Regular rate rhythm, no murmurs, no rubs, no clicks Abd:  soft, positive bowel sounds, no organomegally, no rebound, no guarding Ext:  2 plus pulses, no edema, no cyanosis, no clubbing Skin:  No rashes no nodules Neuro:  CN II through XII intact, motor grossly intact   DEVICE  Normal device function.  See PaceArt for details.   Assess/Plan:

## 2013-04-11 NOTE — Assessment & Plan Note (Signed)
His ventricular rate is well controlled. He will continue his current medical therapy. 

## 2013-04-11 NOTE — Patient Instructions (Addendum)
Your physician wants you to follow-up in: 6 months in the device clinic and 12 months with Dr Taylor You will receive a reminder letter in the mail two months in advance. If you don't receive a letter, please call our office to schedule the follow-up appointment.  

## 2013-04-13 ENCOUNTER — Encounter (HOSPITAL_COMMUNITY): Payer: Medicare Other

## 2013-04-16 ENCOUNTER — Encounter (HOSPITAL_COMMUNITY): Payer: Medicare Other

## 2013-04-18 ENCOUNTER — Encounter (HOSPITAL_COMMUNITY): Payer: Medicare Other

## 2013-04-19 ENCOUNTER — Ambulatory Visit (INDEPENDENT_AMBULATORY_CARE_PROVIDER_SITE_OTHER): Payer: Medicare Other

## 2013-04-19 DIAGNOSIS — D518 Other vitamin B12 deficiency anemias: Secondary | ICD-10-CM

## 2013-04-19 MED ORDER — CYANOCOBALAMIN 1000 MCG/ML IJ SOLN
1000.0000 ug | Freq: Once | INTRAMUSCULAR | Status: AC
  Administered 2013-04-19: 1000 ug via INTRAMUSCULAR

## 2013-04-19 NOTE — Progress Notes (Signed)
Pt asked that I note in his chart during his nurse visit today about the reaction he had to Lyrica. He was advised by Dr Melvenia Needles to increase his Lyrica 75 mg from BID to TID. When he went to TID he became extremely paranoid so he was advised by this doctor to go back down to BID and he has an upcoming appt with her.

## 2013-04-23 ENCOUNTER — Ambulatory Visit (INDEPENDENT_AMBULATORY_CARE_PROVIDER_SITE_OTHER): Payer: Medicare Other | Admitting: Pharmacist

## 2013-04-23 DIAGNOSIS — I4891 Unspecified atrial fibrillation: Secondary | ICD-10-CM | POA: Diagnosis not present

## 2013-04-23 DIAGNOSIS — Z7901 Long term (current) use of anticoagulants: Secondary | ICD-10-CM | POA: Diagnosis not present

## 2013-04-25 ENCOUNTER — Ambulatory Visit (INDEPENDENT_AMBULATORY_CARE_PROVIDER_SITE_OTHER): Payer: Medicare Other | Admitting: Internal Medicine

## 2013-04-25 ENCOUNTER — Encounter: Payer: Self-pay | Admitting: Internal Medicine

## 2013-04-25 VITALS — BP 138/82 | HR 88 | Temp 97.8°F | Resp 16 | Wt 164.0 lb

## 2013-04-25 DIAGNOSIS — T148XXA Other injury of unspecified body region, initial encounter: Secondary | ICD-10-CM

## 2013-04-25 DIAGNOSIS — D518 Other vitamin B12 deficiency anemias: Secondary | ICD-10-CM | POA: Diagnosis not present

## 2013-04-25 DIAGNOSIS — I10 Essential (primary) hypertension: Secondary | ICD-10-CM

## 2013-04-25 DIAGNOSIS — I4891 Unspecified atrial fibrillation: Secondary | ICD-10-CM | POA: Diagnosis not present

## 2013-04-25 DIAGNOSIS — IMO0002 Reserved for concepts with insufficient information to code with codable children: Secondary | ICD-10-CM

## 2013-04-25 NOTE — Assessment & Plan Note (Signed)
Continue B12 monthly

## 2013-04-25 NOTE — Progress Notes (Signed)
Subjective:    Patient ID: ROI JAFARI, male    DOB: 1932/05/17, 79 y.o.   MRN: [id number]  Anemia Presents for follow-up visit. Symptoms include confusion and malaise/fatigue. There has been no abdominal pain, anorexia, bruising/bleeding easily, fever, leg swelling, light-headedness, pallor, palpitations, paresthesias, pica or weight loss. Signs of blood loss that are not present include hematemesis, hematochezia and melena. There are no compliance problems.       Review of Systems  Constitutional: Positive for malaise/fatigue. Negative for fever, chills, weight loss, diaphoresis, activity change, appetite change, fatigue and unexpected weight change.  HENT: Negative.   Eyes: Negative.   Respiratory: Negative.  Negative for cough, choking, shortness of breath, wheezing and stridor.   Cardiovascular: Negative.  Negative for chest pain, palpitations and leg swelling.  Gastrointestinal: Negative.  Negative for nausea, vomiting, abdominal pain, diarrhea, constipation, blood in stool, melena, hematochezia, anorexia and hematemesis.  Endocrine: Negative.   Genitourinary: Negative.   Musculoskeletal: Positive for back pain and gait problem. Negative for myalgias, joint swelling and arthralgias.  Skin: Negative.  Negative for pallor.  Allergic/Immunologic: Negative.   Neurological: Negative.  Negative for dizziness, weakness, light-headedness and paresthesias.  Hematological: Negative.  Negative for adenopathy. Does not bruise/bleed easily.  Psychiatric/Behavioral: Positive for confusion and decreased concentration. Negative for suicidal ideas, hallucinations, behavioral problems, sleep disturbance, self-injury, dysphoric mood and agitation. The patient is not nervous/anxious and is not hyperactive.        Objective:   Physical Exam  Vitals reviewed. Constitutional: He is oriented to person, place, and time. He appears well-developed and well-nourished. No distress.  HENT:  Head:  Normocephalic and atraumatic.  Mouth/Throat: Oropharynx is clear and moist. No oropharyngeal exudate.  Eyes: Conjunctivae are normal. Right eye exhibits no discharge. Left eye exhibits no discharge. No scleral icterus.  Neck: Normal range of motion. Neck supple. No JVD present. No tracheal deviation present. No thyromegaly present.  Cardiovascular: Normal rate, regular rhythm and intact distal pulses.  Exam reveals no gallop and no friction rub.   Murmur heard. Pulmonary/Chest: Effort normal and breath sounds normal. No stridor. No respiratory distress. He has no wheezes. He has no rales. He exhibits no tenderness.  Abdominal: Soft. Bowel sounds are normal. He exhibits no distension and no mass. There is no tenderness. There is no rebound and no guarding.  Musculoskeletal: Normal range of motion. He exhibits no edema and no tenderness.  Lymphadenopathy:    He has no cervical adenopathy.  Neurological: He is oriented to person, place, and time.  Skin: Skin is warm and dry. No rash noted. He is not diaphoretic. No erythema. No pallor.  Psychiatric: He has a normal mood and affect. Judgment and thought content normal. His mood appears not anxious. His affect is not angry, not blunt, not labile and not inappropriate. His speech is delayed and tangential. He is slowed. He is not agitated, not aggressive, not hyperactive, not withdrawn, not actively hallucinating and not combative. Thought content is not paranoid and not delusional. Cognition and memory are impaired. He does not express impulsivity or inappropriate judgment. He does not exhibit a depressed mood. He expresses no homicidal and no suicidal ideation. He expresses no suicidal plans and no homicidal plans. He exhibits abnormal recent memory. He exhibits normal remote memory. He is inattentive.      Lab Results  Component Value Date   WBC 8.3 10/25/2012   HGB 12.9* 10/25/2012   HCT 39.4 10/25/2012   PLT 397 10/25/2012   GLUCOSE 95  10/25/2012    CHOL 122 02/22/2012   TRIG 94.0 02/22/2012   HDL 45.00 02/22/2012   LDLCALC 58 02/22/2012   ALT 20 02/22/2012   AST 23 02/22/2012   NA 139 10/25/2012   K 3.7 10/25/2012   CL 103 10/25/2012   CREATININE 0.69 10/25/2012   BUN 16 10/25/2012   CO2 25 10/25/2012   TSH 0.90 02/22/2012   INR 1.2 04/23/2013   HGBA1C 6.2 03/14/2012      Assessment & Plan:

## 2013-04-25 NOTE — Assessment & Plan Note (Signed)
He has good rate and rhythm control 

## 2013-04-25 NOTE — Assessment & Plan Note (Signed)
His BP is well controlled 

## 2013-04-25 NOTE — Patient Instructions (Addendum)
Vitamin B12 Deficiency Not having enough vitamin B12 is called a deficiency. Your body needs this vitamin for important body functions. HOME CARE  Take all vitamins, herbs, or nutrition drinks (supplements) as told by your doctor.  Get shots (injections) as told. Do not miss your doctor visit.  Eat foods than contain vitamin B12. This includes:  Meat.  Chicken, [place], or other birds (poultry).  Fish.  Eggs.  Cereals or milk with added vitamin B12. Check the label.  Do not drink too much (abuse) alcohol.  Keep all doctor visits as told. GET HELP IF:  You have questions.  Your problems come back. MAKE SURE YOU:  Understand these instructions.  Will watch your condition.  Will get help right away if you are not doing well or get worse. Document Released: 11/25/2011 Document Revised: 02/28/2012 Document Reviewed: 11/25/2011 [facility] Patient Information 2013 [place], [place].

## 2013-04-25 NOTE — Assessment & Plan Note (Signed)
Forms completed for LTC insurance

## 2013-04-30 DIAGNOSIS — N529 Male erectile dysfunction, unspecified: Secondary | ICD-10-CM | POA: Diagnosis not present

## 2013-04-30 DIAGNOSIS — N401 Enlarged prostate with lower urinary tract symptoms: Secondary | ICD-10-CM | POA: Diagnosis not present

## 2013-05-03 ENCOUNTER — Ambulatory Visit (INDEPENDENT_AMBULATORY_CARE_PROVIDER_SITE_OTHER): Payer: Medicare Other | Admitting: Pharmacist

## 2013-05-03 DIAGNOSIS — Z7901 Long term (current) use of anticoagulants: Secondary | ICD-10-CM

## 2013-05-03 DIAGNOSIS — I4891 Unspecified atrial fibrillation: Secondary | ICD-10-CM | POA: Diagnosis not present

## 2013-05-10 ENCOUNTER — Ambulatory Visit (INDEPENDENT_AMBULATORY_CARE_PROVIDER_SITE_OTHER): Payer: Medicare Other

## 2013-05-10 DIAGNOSIS — D518 Other vitamin B12 deficiency anemias: Secondary | ICD-10-CM | POA: Diagnosis not present

## 2013-05-10 MED ORDER — CYANOCOBALAMIN 1000 MCG/ML IJ SOLN
1000.0000 ug | Freq: Once | INTRAMUSCULAR | Status: AC
  Administered 2013-05-10: 1000 ug via INTRAMUSCULAR

## 2013-05-18 ENCOUNTER — Ambulatory Visit (INDEPENDENT_AMBULATORY_CARE_PROVIDER_SITE_OTHER): Payer: Medicare Other | Admitting: *Deleted

## 2013-05-18 DIAGNOSIS — Z7901 Long term (current) use of anticoagulants: Secondary | ICD-10-CM | POA: Diagnosis not present

## 2013-05-18 DIAGNOSIS — I4891 Unspecified atrial fibrillation: Secondary | ICD-10-CM

## 2013-05-18 LAB — POCT INR: INR: 2.4

## 2013-05-30 ENCOUNTER — Ambulatory Visit (INDEPENDENT_AMBULATORY_CARE_PROVIDER_SITE_OTHER): Payer: Medicare Other

## 2013-05-30 DIAGNOSIS — D518 Other vitamin B12 deficiency anemias: Secondary | ICD-10-CM

## 2013-05-30 DIAGNOSIS — D519 Vitamin B12 deficiency anemia, unspecified: Secondary | ICD-10-CM

## 2013-05-30 MED ORDER — CYANOCOBALAMIN 1000 MCG/ML IJ SOLN
1000.0000 ug | Freq: Once | INTRAMUSCULAR | Status: AC
  Administered 2013-05-30: 1000 ug via INTRAMUSCULAR

## 2013-06-07 DIAGNOSIS — M810 Age-related osteoporosis without current pathological fracture: Secondary | ICD-10-CM | POA: Diagnosis not present

## 2013-06-08 ENCOUNTER — Ambulatory Visit (INDEPENDENT_AMBULATORY_CARE_PROVIDER_SITE_OTHER): Payer: Medicare Other | Admitting: Pharmacist

## 2013-06-08 DIAGNOSIS — Z7901 Long term (current) use of anticoagulants: Secondary | ICD-10-CM | POA: Diagnosis not present

## 2013-06-08 DIAGNOSIS — I4891 Unspecified atrial fibrillation: Secondary | ICD-10-CM | POA: Diagnosis not present

## 2013-06-08 LAB — POCT INR: INR: 2.9

## 2013-06-13 ENCOUNTER — Telehealth: Payer: Self-pay | Admitting: *Deleted

## 2013-06-13 ENCOUNTER — Emergency Department (HOSPITAL_COMMUNITY): Payer: Medicare Other

## 2013-06-13 ENCOUNTER — Telehealth: Payer: Self-pay | Admitting: Cardiology

## 2013-06-13 ENCOUNTER — Encounter (HOSPITAL_COMMUNITY): Payer: Self-pay | Admitting: Emergency Medicine

## 2013-06-13 ENCOUNTER — Emergency Department (HOSPITAL_COMMUNITY)
Admission: EM | Admit: 2013-06-13 | Discharge: 2013-06-13 | Disposition: A | Discharge status: Home / Self Care | Payer: Medicare Other | Attending: Emergency Medicine | Admitting: Emergency Medicine

## 2013-06-13 ENCOUNTER — Other Ambulatory Visit: Payer: Self-pay

## 2013-06-13 DIAGNOSIS — R55 Syncope and collapse: Secondary | ICD-10-CM | POA: Diagnosis not present

## 2013-06-13 DIAGNOSIS — E785 Hyperlipidemia, unspecified: Secondary | ICD-10-CM | POA: Insufficient documentation

## 2013-06-13 DIAGNOSIS — R410 Disorientation, unspecified: Secondary | ICD-10-CM

## 2013-06-13 DIAGNOSIS — I2510 Atherosclerotic heart disease of native coronary artery without angina pectoris: Secondary | ICD-10-CM | POA: Insufficient documentation

## 2013-06-13 DIAGNOSIS — Z950 Presence of cardiac pacemaker: Secondary | ICD-10-CM | POA: Insufficient documentation

## 2013-06-13 DIAGNOSIS — F29 Unspecified psychosis not due to a substance or known physiological condition: Secondary | ICD-10-CM | POA: Insufficient documentation

## 2013-06-13 DIAGNOSIS — R404 Transient alteration of awareness: Secondary | ICD-10-CM | POA: Diagnosis not present

## 2013-06-13 DIAGNOSIS — K219 Gastro-esophageal reflux disease without esophagitis: Secondary | ICD-10-CM | POA: Diagnosis not present

## 2013-06-13 DIAGNOSIS — I10 Essential (primary) hypertension: Secondary | ICD-10-CM | POA: Insufficient documentation

## 2013-06-13 DIAGNOSIS — J438 Other emphysema: Secondary | ICD-10-CM | POA: Diagnosis not present

## 2013-06-13 DIAGNOSIS — Z951 Presence of aortocoronary bypass graft: Secondary | ICD-10-CM | POA: Diagnosis not present

## 2013-06-13 DIAGNOSIS — Z79899 Other long term (current) drug therapy: Secondary | ICD-10-CM | POA: Diagnosis not present

## 2013-06-13 DIAGNOSIS — Z7901 Long term (current) use of anticoagulants: Secondary | ICD-10-CM | POA: Insufficient documentation

## 2013-06-13 DIAGNOSIS — Z8701 Personal history of pneumonia (recurrent): Secondary | ICD-10-CM | POA: Diagnosis not present

## 2013-06-13 DIAGNOSIS — Z7982 Long term (current) use of aspirin: Secondary | ICD-10-CM | POA: Diagnosis not present

## 2013-06-13 DIAGNOSIS — Z880 Allergy status to penicillin: Secondary | ICD-10-CM | POA: Diagnosis not present

## 2013-06-13 DIAGNOSIS — Z8679 Personal history of other diseases of the circulatory system: Secondary | ICD-10-CM | POA: Diagnosis not present

## 2013-06-13 DIAGNOSIS — I4891 Unspecified atrial fibrillation: Secondary | ICD-10-CM | POA: Insufficient documentation

## 2013-06-13 DIAGNOSIS — Z8739 Personal history of other diseases of the musculoskeletal system and connective tissue: Secondary | ICD-10-CM | POA: Diagnosis not present

## 2013-06-13 DIAGNOSIS — IMO0002 Reserved for concepts with insufficient information to code with codable children: Secondary | ICD-10-CM | POA: Diagnosis not present

## 2013-06-13 DIAGNOSIS — R443 Hallucinations, unspecified: Secondary | ICD-10-CM | POA: Diagnosis not present

## 2013-06-13 DIAGNOSIS — Z8709 Personal history of other diseases of the respiratory system: Secondary | ICD-10-CM | POA: Diagnosis not present

## 2013-06-13 DIAGNOSIS — I252 Old myocardial infarction: Secondary | ICD-10-CM | POA: Insufficient documentation

## 2013-06-13 DIAGNOSIS — R4182 Altered mental status, unspecified: Secondary | ICD-10-CM | POA: Diagnosis not present

## 2013-06-13 LAB — URINALYSIS, ROUTINE W REFLEX MICROSCOPIC
Bilirubin Urine: NEGATIVE
Leukocytes, UA: NEGATIVE
Nitrite: NEGATIVE
Specific Gravity, Urine: 1.015 (ref 1.005–1.030)
pH: 7 (ref 5.0–8.0)

## 2013-06-13 LAB — CBC WITH DIFFERENTIAL/PLATELET
Basophils Absolute: 0.1 10*3/uL (ref 0.0–0.1)
Eosinophils Relative: 4 % (ref 0–5)
Lymphocytes Relative: 21 % (ref 12–46)
MCV: 90.9 fL (ref 78.0–100.0)
Neutro Abs: 6.1 10*3/uL (ref 1.7–7.7)
Neutrophils Relative %: 66 % (ref 43–77)
Platelets: 239 10*3/uL (ref 150–400)
RBC: 4.29 MIL/uL (ref 4.22–5.81)
RDW: 15.3 % (ref 11.5–15.5)
WBC: 9.2 10*3/uL (ref 4.0–10.5)

## 2013-06-13 LAB — COMPREHENSIVE METABOLIC PANEL
ALT: 13 U/L (ref 0–53)
AST: 20 U/L (ref 0–37)
Alkaline Phosphatase: 68 U/L (ref 39–117)
CO2: 24 mEq/L (ref 19–32)
Calcium: 9.1 mg/dL (ref 8.4–10.5)
GFR calc non Af Amer: 90 mL/min — ABNORMAL LOW (ref >90)
Potassium: 4 mEq/L (ref 3.5–5.1)
Sodium: 139 mEq/L (ref 135–145)

## 2013-06-13 LAB — PROTIME-INR: INR: 2.69 — ABNORMAL HIGH (ref 0.00–1.49)

## 2013-06-13 LAB — POCT I-STAT TROPONIN I

## 2013-06-13 NOTE — ED Provider Notes (Addendum)
History    CSN: [id number] Arrival date & time 06/13/13  1356  First MD Initiated Contact with Patient 06/13/13 1459     Chief Complaint  Patient presents with  . wife states pt is confused    (Consider location/radiation/quality/duration/timing/severity/associated sxs/prior Treatment) HPI  Patient here and his wife. She states patient was eating his breakfast and then he stopped responding. She states he sort of fell over to his side and wouldn't respond to her. She states it lasted less than about a minute however she states it happened 3-4 times. She states she had him get up and go lay in his recliner and then he started talking  about the 5 gallons of liquor their church and sent over for his coffee (they didn't) and did not recall he had  just eaten breakfast. She states he then wouldn't take his morning medications and he started yelling at her about the new computer, printer, and desk however she states they do not have new equipment. He was in the office with the computer and she was trying to get to the door to let EMS come into the house however patient was walking very slowly and made it hard for her to get to the door. Patient does not recall any of this happening. Patient states his short-term memory is bad. He states yesterday his blood pressure was 155 around noon so he took an extra of his Lopressor and his blood pressure improved to 133. He however did not check his heart rate. He denies headache, chest pain, shortness of breath, diaphoresis, he denies any difficulty using his arms or his legs. His wife states he had hallucinations about a year ago when they were at the beach but they related to him being on Lyrica of 3 times a day. That was decreased to twice a day. Wife reports his been having increasing agitation and some paranoia however it is improving since they changed his medication. She states he has a quick temper. They state he has dementia.    PCP Dr Leitha Schuller Cardiologist Dr Riley Kill  Past Medical History  Diagnosis Date  . Atrial fibrillation     on chronic coumadin  . Hyperlipidemia   . Bronchiectasis without acute exacerbation   . Disorders of diaphragm   . Plantar fascial fibromatosis   . Epistaxis   . Unspecified sinusitis (chronic)   . Allergic rhinitis, cause unspecified   . Spinal stenosis, unspecified region other than cervical   . Hypertension   . Coronary artery disease     s/p CABG 1993, s/p PCI of SVG to OM '03  . Tachy-brady syndrome     s/p Medtronic PPM '07  . Myocardial infarction   . Pneumonia     " multiple times "  . Shortness of breath   . GERD (gastroesophageal reflux disease)   . Arthritis    Past Surgical History  Procedure Laterality Date  . Coronary artery bypass graft      LIMA to LAD, SVG to OM, SVG to left circumflex, SVG to PD/PLSA  . Pacemaker insertion      2007, Medtronic dual-chamber  . Kidney cyst removal    . Doppler echocardiography  2011  . Cholecystectomy     Family History  Problem Relation Age of Onset  . Cancer Neg Hx   . Heart disease Neg Hx   . Stroke Neg Hx    History  Substance Use Topics  . Smoking status: Never Smoker   .  Smokeless tobacco: Never Used  . Alcohol Use: No  lives at home Lives with spouse  Review of Systems  All other systems reviewed and are negative.    Allergies  Clindamycin; Doxycycline hyclate; Erythromycin; Metronidazole; Penicillins; Sulfonamide derivatives; and Tetracycline  Home Medications   Current Outpatient Rx  Name  Route  Sig  Dispense  Refill  . aspirin EC 81 MG tablet   Oral   Take 81 mg by mouth daily.         . Calcium-Magnesium-Vitamin D (CITRACAL CALCIUM+D) 600-40-500 MG-MG-UNIT TB24   Oral   Take 1 tablet by mouth 2 (two) times daily.          . cyanocobalamin (,VITAMIN B-12,) 1000 MCG/ML injection   Intramuscular   Inject 1,000 mcg into the muscle every 21 ( twenty-one) days.          . fexofenadine  (ALLEGRA) 180 MG tablet   Oral   Take 180 mg by mouth at bedtime as needed (for allergies).          . finasteride (PROSCAR) 5 MG tablet   Oral   Take 1 tablet (5 mg total) by mouth at bedtime.   90 tablet   2   . fluticasone (FLONASE) 50 MCG/ACT nasal spray   Nasal   Place 2 sprays into the nose daily as needed for rhinitis.         [name] Kitchen HYDROcodone-acetaminophen (NORCO/VICODIN) 5-325 MG per tablet   Oral   Take 1 tablet by mouth every 4 (four) hours as needed for pain. For pain   30 tablet   0   . metoprolol tartrate (LOPRESSOR) 25 MG tablet   Oral   Take 1 tablet (25 mg total) by mouth 2 (two) times daily.   180 tablet   6   . nitroGLYCERIN (NITROSTAT) 0.4 MG SL tablet   Sublingual   Place 0.4 mg under the tongue every 5 (five) minutes x 3 doses as needed for chest pain.         [name] Kitchen omeprazole (PRILOSEC) 40 MG capsule   Oral   Take 40 mg by mouth daily.           . polyethylene glycol (MIRALAX / GLYCOLAX) packet   Oral   Take 17 g by mouth daily.         . pregabalin (LYRICA) 75 MG capsule   Oral   Take 75 mg by mouth 2 (two) times daily.          [name] Kitchen PROLIA 60 MG/ML SOLN injection   Subcutaneous   Inject 60 mg into the skin every 6 (six) months.          . rosuvastatin (CRESTOR) 10 MG tablet   Oral   Take 1 tablet (10 mg total) by mouth daily.   90 tablet   3   . terazosin (HYTRIN) 5 MG capsule   Oral   Take 1 capsule (5 mg total) by mouth daily.   90 capsule   2   . warfarin (COUMADIN) 1 MG tablet   Oral   Take 0.5-1 mg by mouth daily. 0.5 tab on Mondays and Fridays; 1 tab daily otherwise         . zolpidem (AMBIEN) 10 MG tablet   Oral   Take 10-15 mg by mouth at bedtime.           BP 160/88  Pulse 60  Temp(Src) 97.5 F (36.4 C) (Oral)  Resp 16  SpO2  98%  Vital signs normal    Physical Exam  Nursing note and vitals reviewed. Constitutional: He is oriented to person, place, and time. He appears well-developed and  well-nourished.  Non-toxic appearance. He does not appear ill. No distress.  HENT:  Head: Normocephalic and atraumatic.  Right Ear: External ear normal.  Left Ear: External ear normal.  Nose: Nose normal. No mucosal edema or rhinorrhea.  Mouth/Throat: Oropharynx is clear and moist and mucous membranes are normal. No dental abscesses or edematous.  Eyes: Conjunctivae and EOM are normal. Pupils are equal, round, and reactive to light.  Neck: Normal range of motion and full passive range of motion without pain. Neck supple.  Cardiovascular: Normal rate, regular rhythm and normal heart sounds.  Exam reveals no gallop and no friction rub.   No murmur heard. Pulmonary/Chest: Effort normal and breath sounds normal. No respiratory distress. He has no wheezes. He has no rhonchi. He has no rales. He exhibits no tenderness and no crepitus.  Abdominal: Soft. Normal appearance and bowel sounds are normal. He exhibits no distension. There is no tenderness. There is no rebound and no guarding.  Musculoskeletal: Normal range of motion. He exhibits no edema and no tenderness.  Moves all extremities well.   Neurological: He is alert and oriented to person, place, and time. He has normal strength. No cranial nerve deficit.  Skin: Skin is warm, dry and intact. No rash noted. No erythema. No pallor.  Psychiatric: He has a normal mood and affect. His speech is normal and behavior is normal. His mood appears not anxious.    ED Course  Procedures (including critical care time)  Pt ambulated and his pulse ox remained 98% on RA and his pulse was 90's.   Discussed test results which were basically normal. I discussed discharging him home.  Pt did have an unresponsive episode at home, however patient does not want to be admitted. They did consider being admitted for a short time, however he refused to be admitted and his wife states she doesn't want him to be admitted to observation, because they can't afford it. Have  decided to go home and follow up with Dr Yetta Barre. They report patient has a diagnosis of dementia although it's not listed on his problem list.    Results for orders placed during the hospital encounter of 06/13/13  CBC WITH DIFFERENTIAL      Result Value Range   WBC 9.2  4.0 - 10.5 K/uL   RBC 4.29  4.22 - 5.81 MIL/uL   Hemoglobin 12.9 (*) 13.0 - 17.0 g/dL   HCT 16.1  09.6 - 04.5 %   MCV 90.9  78.0 - 100.0 fL   MCH 30.1  26.0 - 34.0 pg   MCHC 33.1  30.0 - 36.0 g/dL   RDW 40.9  81.1 - 91.4 %   Platelets 239  150 - 400 K/uL   Neutrophils Relative % 66  43 - 77 %   Neutro Abs 6.1  1.7 - 7.7 K/uL   Lymphocytes Relative 21  12 - 46 %   Lymphs Abs 1.9  0.7 - 4.0 K/uL   Monocytes Relative 8  3 - 12 %   Monocytes Absolute 0.8  0.1 - 1.0 K/uL   Eosinophils Relative 4  0 - 5 %   Eosinophils Absolute 0.4  0.0 - 0.7 K/uL   Basophils Relative 1  0 - 1 %   Basophils Absolute 0.1  0.0 - 0.1 K/uL  COMPREHENSIVE METABOLIC  PANEL      Result Value Range   Sodium 139  135 - 145 mEq/L   Potassium 4.0  3.5 - 5.1 mEq/L   Chloride 107  96 - 112 mEq/L   CO2 24  19 - 32 mEq/L   Glucose, Bld 103 (*) 70 - 99 mg/dL   BUN 9  6 - 23 mg/dL   Creatinine, Ser 1.47  0.50 - 1.35 mg/dL   Calcium 9.1  8.4 - 82.9 mg/dL   Total Protein 7.2  6.0 - 8.3 g/dL   Albumin 3.3 (*) 3.5 - 5.2 g/dL   AST 20  0 - 37 U/L   ALT 13  0 - 53 U/L   Alkaline Phosphatase 68  39 - 117 U/L   Total Bilirubin 0.2 (*) 0.3 - 1.2 mg/dL   GFR calc non Af Amer 90 (*) >90 mL/min   GFR calc Af Amer >90  >90 mL/min  APTT      Result Value Range   aPTT 44 (*) 24 - 37 seconds  PROTIME-INR      Result Value Range   Prothrombin Time 27.7 (*) 11.6 - 15.2 seconds   INR 2.69 (*) 0.00 - 1.49  URINALYSIS, ROUTINE W REFLEX MICROSCOPIC      Result Value Range   Color, Urine YELLOW  YELLOW   APPearance CLEAR  CLEAR   Specific Gravity, Urine 1.015  1.005 - 1.030   pH 7.0  5.0 - 8.0   Glucose, UA NEGATIVE  NEGATIVE mg/dL   Hgb urine dipstick  NEGATIVE  NEGATIVE   Bilirubin Urine NEGATIVE  NEGATIVE   Ketones, ur NEGATIVE  NEGATIVE mg/dL   Protein, ur NEGATIVE  NEGATIVE mg/dL   Urobilinogen, UA 1.0  0.0 - 1.0 mg/dL   Nitrite NEGATIVE  NEGATIVE   Leukocytes, UA NEGATIVE  NEGATIVE  POCT I-STAT TROPONIN I      Result Value Range   Troponin i, poc 0.00  0.00 - 0.08 ng/mL   Comment 3            Laboratory interpretation all normal except therapeutic INR   Dg Chest 2 View  06/13/2013   *RADIOLOGY REPORT*  Clinical Data: Syncope today, confusion, history of CABG and coronary artery stents  CHEST - 2 VIEW  Comparison: Chest x-ray of 03/25/2011  Findings: The lungs are hyperaerated consistent with emphysema.  No focal infiltrate or effusion is seen.  Cardiomegaly is stable.  A permanent pacemaker remains.  There is chronic elevation of left hemidiaphragm.  The bones are osteopenic.  IMPRESSION: Emphysema.  Stable cardiomegaly with permanent pacemaker.  No active lung disease.   Original Report Authenticated By: Dwyane Dee, M.D.   Ct Head Wo Contrast  06/13/2013   *RADIOLOGY REPORT*  Clinical Data: Altered mental status.  Possible syncope.  CT HEAD WITHOUT CONTRAST  Technique:  Contiguous axial images were obtained from the base of the skull through the vertex without contrast.  Comparison: None.  Findings: There is no evidence of acute intracranial hemorrhage, mass lesion, brain edema or extra-axial fluid collection.  The ventricles and subarachnoid spaces are appropriately sized for age. There is no CT evidence of acute cortical infarction.  There is minimal subcortical white matter disease. Intracranial vascular calcifications are noted, primarily involving the vertebral arteries.  The visualized paranasal sinuses are clear. The calvarium is intact.  IMPRESSION: No acute or reversible intracranial findings demonstrated.   Original Report Authenticated By: Carey Bullocks, M.D.    Date: 06/13/2013  Rate:  73  Rhythm: atrial fibrillation,  atrial flutter and Ventricular pacemaker  Old EKG Reviewed: unchanged from 10/18/2012     1. Confusion   2. Near syncope    Plan discharge  Devoria Albe, MD, FACEP     MDM  patient has a history of dementia and has had paranoia and psychosis about a year ago. He did have a brief episode today of some unresponsiveness however he's not had any more in the emergency department. He needs to be seen by his PCP and discuss his current medications. Patient could be admitted for his near syncopal events however patient is adamant he does not want to be admitted and his family is not going to override his opinion.     Ward Givens, MD 06/13/13 1825  Ward Givens, MD 06/13/13 1610

## 2013-06-13 NOTE — ED Notes (Signed)
Per EMS: wife states that pt is confused from a statement about a computer this morning. Pt is alert and oriented x 4, vitals WDL.

## 2013-06-13 NOTE — Telephone Encounter (Signed)
Ollen Gross, pts wife called states pt is hallucinating after waking up.  She has called again stating the pt is in the ER due to a fall today.  Please advise

## 2013-06-13 NOTE — ED Notes (Signed)
Bed:WA23<BR> Expected date:<BR> Expected time:<BR> Means of arrival:<BR> Comments:<BR> ems

## 2013-06-15 NOTE — Telephone Encounter (Signed)
Pt went to ER

## 2013-06-20 ENCOUNTER — Other Ambulatory Visit (INDEPENDENT_AMBULATORY_CARE_PROVIDER_SITE_OTHER): Payer: Medicare Other

## 2013-06-20 ENCOUNTER — Encounter: Payer: Self-pay | Admitting: Internal Medicine

## 2013-06-20 ENCOUNTER — Ambulatory Visit (INDEPENDENT_AMBULATORY_CARE_PROVIDER_SITE_OTHER): Payer: Medicare Other | Admitting: Internal Medicine

## 2013-06-20 ENCOUNTER — Ambulatory Visit: Payer: Medicare Other

## 2013-06-20 VITALS — BP 134/78 | HR 86 | Temp 98.4°F | Resp 16 | Wt 166.0 lb

## 2013-06-20 DIAGNOSIS — T50904A Poisoning by unspecified drugs, medicaments and biological substances, undetermined, initial encounter: Secondary | ICD-10-CM

## 2013-06-20 DIAGNOSIS — D518 Other vitamin B12 deficiency anemias: Secondary | ICD-10-CM

## 2013-06-20 DIAGNOSIS — F19921 Other psychoactive substance use, unspecified with intoxication with delirium: Secondary | ICD-10-CM

## 2013-06-20 DIAGNOSIS — E785 Hyperlipidemia, unspecified: Secondary | ICD-10-CM | POA: Diagnosis not present

## 2013-06-20 DIAGNOSIS — I4891 Unspecified atrial fibrillation: Secondary | ICD-10-CM

## 2013-06-20 DIAGNOSIS — I10 Essential (primary) hypertension: Secondary | ICD-10-CM

## 2013-06-20 LAB — COMPREHENSIVE METABOLIC PANEL
Albumin: 3.8 g/dL (ref 3.5–5.2)
Alkaline Phosphatase: 65 U/L (ref 39–117)
CO2: 24 mEq/L (ref 19–32)
GFR: 100.51 mL/min (ref >60.00)
Glucose, Bld: 120 mg/dL — ABNORMAL HIGH (ref 70–99)
Potassium: 4 mEq/L (ref 3.5–5.1)
Sodium: 141 mEq/L (ref 135–145)
Total Protein: 7.4 g/dL (ref 6.0–8.3)

## 2013-06-20 LAB — CBC WITH DIFFERENTIAL/PLATELET
Eosinophils Relative: 3.2 % (ref 0.0–5.0)
Monocytes Relative: 8.4 % (ref 3.0–12.0)
Neutrophils Relative %: 70.1 % (ref 43.0–77.0)
Platelets: 238 10*3/uL (ref 150.0–400.0)
WBC: 10.2 10*3/uL (ref 4.5–10.5)

## 2013-06-20 LAB — LIPID PANEL
Cholesterol: 137 mg/dL (ref 0–200)
LDL Cholesterol: 68 mg/dL (ref 0–99)

## 2013-06-20 MED ORDER — CYANOCOBALAMIN 1000 MCG/ML IJ SOLN
1000.0000 ug | Freq: Once | INTRAMUSCULAR | Status: AC
  Administered 2013-06-20: 1000 ug via INTRAMUSCULAR

## 2013-06-20 NOTE — Assessment & Plan Note (Signed)
FLP today 

## 2013-06-20 NOTE — Assessment & Plan Note (Signed)
He was recently seen in the ER for a confusional episode, he tells me that he thinks it was related to extra doses of ambien and lyrica I have asked him to stop lyrica and ambien since they may be contributing to confusion

## 2013-06-20 NOTE — Assessment & Plan Note (Signed)
His BP is well controlled 

## 2013-06-20 NOTE — Progress Notes (Signed)
Subjective:    Patient ID: Dwayne [name], male    DOB: [birth year], 79 y.o.   MRN: [id number]  Hyperlipidemia This is a chronic problem. The current episode started more than 1 year ago. The problem is controlled. Recent lipid tests were reviewed and are variable. He has no history of chronic renal disease, diabetes, hypothyroidism, liver disease, obesity or nephrotic syndrome. Pertinent negatives include no chest pain, focal sensory loss, focal weakness, leg pain, myalgias or shortness of breath. Current antihyperlipidemic treatment includes statins. There are no compliance problems.       Review of Systems  Constitutional: Negative.   HENT: Negative.   Eyes: Negative.   Respiratory: Negative.  Negative for apnea, cough, choking, shortness of breath, wheezing and stridor.   Cardiovascular: Negative.  Negative for chest pain, palpitations and leg swelling.  Gastrointestinal: Positive for constipation. Negative for nausea, vomiting, abdominal pain, diarrhea and blood in stool.  Endocrine: Negative.   Genitourinary: Negative.   Musculoskeletal: Positive for back pain. Negative for myalgias, joint swelling, arthralgias and gait problem.  Skin: Negative.   Neurological: Negative.  Negative for dizziness, focal weakness, seizures, syncope, speech difficulty, weakness, light-headedness and headaches.  Hematological: Negative.  Negative for adenopathy. Does not bruise/bleed easily.  Psychiatric/Behavioral: Positive for behavioral problems, confusion and decreased concentration. Negative for suicidal ideas, hallucinations, sleep disturbance, self-injury, dysphoric mood and agitation. The patient is not nervous/anxious and is not hyperactive.        Objective:   Physical Exam  Vitals reviewed. Constitutional: He is oriented to person, place, and time. He appears well-developed and well-nourished.  Non-toxic appearance. He does not have a sickly appearance. He does not appear ill. No distress.   HENT:  Head: Normocephalic and atraumatic.  Mouth/Throat: Oropharynx is clear and moist. No oropharyngeal exudate.  Eyes: Conjunctivae are normal. Right eye exhibits no discharge. Left eye exhibits no discharge. No scleral icterus.  Neck: Normal range of motion. Neck supple. No JVD present. No tracheal deviation present. No thyromegaly present.  Cardiovascular: Normal rate, regular rhythm and intact distal pulses.  Exam reveals no gallop and no friction rub.   Murmur heard. Pulmonary/Chest: Effort normal and breath sounds normal. No stridor. No respiratory distress. He has no wheezes. He has no rales. He exhibits no tenderness.  Abdominal: Soft. Bowel sounds are normal. He exhibits no distension and no mass. There is no tenderness. There is no rebound and no guarding.  Musculoskeletal: Normal range of motion. He exhibits no edema and no tenderness.  Lymphadenopathy:    He has no cervical adenopathy.  Neurological: He is oriented to person, place, and time.  Skin: Skin is warm and dry. No rash noted. He is not diaphoretic. No erythema. No pallor.  Psychiatric: He has a normal mood and affect. Judgment and thought content normal. His mood appears not anxious. His affect is not angry, not blunt, not labile and not inappropriate. His speech is tangential. His speech is not rapid and/or pressured, not delayed and not slurred. He is slowed. He is not withdrawn and not actively hallucinating. He does not exhibit a depressed mood. He is communicative. He exhibits abnormal recent memory. He exhibits normal remote memory. He is inattentive.     Lab Results  Component Value Date   WBC 9.2 06/13/2013   HGB 12.9* 06/13/2013   HCT 39.0 06/13/2013   PLT 239 06/13/2013   GLUCOSE 103* 06/13/2013   CHOL 122 02/22/2012   TRIG 94.0 02/22/2012   HDL 45.00 02/22/2012  LDLCALC 58 02/22/2012   ALT 13 06/13/2013   AST 20 06/13/2013   NA 139 06/13/2013   K 4.0 06/13/2013   CL 107 06/13/2013   CREATININE 0.66 06/13/2013    BUN 9 06/13/2013   CO2 24 06/13/2013   TSH 0.90 02/22/2012   INR 2.69* 06/13/2013   HGBA1C 6.2 03/14/2012       Assessment & Plan:

## 2013-06-20 NOTE — Assessment & Plan Note (Signed)
Continue B12 injections Will check his CBC today

## 2013-06-20 NOTE — Patient Instructions (Signed)

## 2013-06-20 NOTE — Assessment & Plan Note (Signed)
He has good rate/rhythm control today

## 2013-06-21 ENCOUNTER — Other Ambulatory Visit: Payer: Self-pay | Admitting: Internal Medicine

## 2013-06-26 ENCOUNTER — Other Ambulatory Visit: Payer: Self-pay

## 2013-06-26 MED ORDER — METOPROLOL TARTRATE 25 MG PO TABS: 25.0000 mg | ORAL_TABLET | Freq: Two times a day (BID) | ORAL | Status: DC

## 2013-07-06 ENCOUNTER — Ambulatory Visit (INDEPENDENT_AMBULATORY_CARE_PROVIDER_SITE_OTHER): Payer: Medicare Other | Admitting: *Deleted

## 2013-07-06 DIAGNOSIS — I4891 Unspecified atrial fibrillation: Secondary | ICD-10-CM | POA: Diagnosis not present

## 2013-07-06 DIAGNOSIS — Z7901 Long term (current) use of anticoagulants: Secondary | ICD-10-CM | POA: Diagnosis not present

## 2013-07-13 ENCOUNTER — Other Ambulatory Visit: Payer: Self-pay | Admitting: Cardiology

## 2013-08-06 ENCOUNTER — Encounter: Payer: Self-pay | Admitting: Cardiology

## 2013-08-06 ENCOUNTER — Ambulatory Visit (INDEPENDENT_AMBULATORY_CARE_PROVIDER_SITE_OTHER): Payer: Medicare Other | Admitting: *Deleted

## 2013-08-06 ENCOUNTER — Ambulatory Visit (INDEPENDENT_AMBULATORY_CARE_PROVIDER_SITE_OTHER): Payer: Medicare Other | Admitting: Cardiology

## 2013-08-06 VITALS — BP 122/74 | HR 80 | Ht 70.0 in | Wt 167.4 lb

## 2013-08-06 DIAGNOSIS — Z7901 Long term (current) use of anticoagulants: Secondary | ICD-10-CM

## 2013-08-06 DIAGNOSIS — I25810 Atherosclerosis of coronary artery bypass graft(s) without angina pectoris: Secondary | ICD-10-CM

## 2013-08-06 DIAGNOSIS — I4891 Unspecified atrial fibrillation: Secondary | ICD-10-CM

## 2013-08-06 DIAGNOSIS — Z950 Presence of cardiac pacemaker: Secondary | ICD-10-CM

## 2013-08-06 DIAGNOSIS — I340 Nonrheumatic mitral (valve) insufficiency: Secondary | ICD-10-CM

## 2013-08-06 DIAGNOSIS — E785 Hyperlipidemia, unspecified: Secondary | ICD-10-CM

## 2013-08-06 DIAGNOSIS — I10 Essential (primary) hypertension: Secondary | ICD-10-CM | POA: Diagnosis not present

## 2013-08-06 DIAGNOSIS — I059 Rheumatic mitral valve disease, unspecified: Secondary | ICD-10-CM

## 2013-08-06 LAB — POCT INR: INR: 2.3

## 2013-08-06 NOTE — Assessment & Plan Note (Signed)
Blood pressure controlled. Continue present medications. 

## 2013-08-06 NOTE — Patient Instructions (Addendum)

## 2013-08-06 NOTE — Assessment & Plan Note (Signed)
Continue statin. 

## 2013-08-06 NOTE — Assessment & Plan Note (Signed)
Patient remains in atrial fibrillation. Continue beta blocker for rate control. Continue Coumadin with goal INR 2-3. He does not wish to consider a new oral anticoagulant.

## 2013-08-06 NOTE — Assessment & Plan Note (Signed)
Followed by electrophysiology. 

## 2013-08-06 NOTE — Assessment & Plan Note (Signed)
Plan repeat echocardiogram. 

## 2013-08-06 NOTE — Progress Notes (Signed)
79 year old male previously followed by Dr. Riley Kill for followup of coronary artery disease, atrial fibrillation and prior pacemaker placement. Patient has had previous coronary artery bypassing graft with a LIMA to the LAD, saphenous vein graft to the acute marginal, saphenous vein graft to the left circumflex and saphenous vein graft to the PD/posterior lateral. He is status post PCI of the saphenous vein graft to his obtuse marginal in 2003. Abdominal CT in June 2011 showed no abdominal aortic aneurysm. Last echocardiogram in May 2011 showed an ejection fraction of 55-60%. There was mild to moderate mitral regurgitation, moderate left atrial enlargement. Patient last seenby Dr. Riley Kill in February of 2014. Since that time he denies any dyspnea on exertion, orthopnea, PND, pedal edema, syncope, exertional chest pain or bleeding.   Current Outpatient Prescriptions  Medication Sig Dispense Refill  . aspirin EC 81 MG tablet Take 81 mg by mouth daily.      . Calcium-Magnesium-Vitamin D (CITRACAL CALCIUM+D) 600-40-500 MG-MG-UNIT TB24 Take 1 tablet by mouth 2 (two) times daily.       . CRESTOR 10 MG tablet Take 1 tablet by mouth  daily  90 tablet  3  . fexofenadine (ALLEGRA) 180 MG tablet Take 180 mg by mouth at bedtime as needed (for allergies).       . finasteride (PROSCAR) 5 MG tablet Take 1 tablet (5 mg total) by mouth at bedtime.  90 tablet  2  . fluticasone (FLONASE) 50 MCG/ACT nasal spray Place 2 sprays into the nose daily as needed for rhinitis.      [name] Kitchen HYDROcodone-acetaminophen (NORCO/VICODIN) 5-325 MG per tablet Take 1 tablet by mouth every 4 (four) hours as needed for pain. For pain  30 tablet  0  . metoprolol tartrate (LOPRESSOR) 25 MG tablet Take 1 tablet (25 mg total) by mouth 2 (two) times daily.  180 tablet  6  . nitroGLYCERIN (NITROSTAT) 0.4 MG SL tablet Place 0.4 mg under the tongue every 5 (five) minutes x 3 doses as needed for chest pain.      [name] Kitchen omeprazole (PRILOSEC) 40 MG capsule  Take 40 mg by mouth daily.        . polyethylene glycol (MIRALAX / GLYCOLAX) packet Take 17 g by mouth daily.      [name] Kitchen PROLIA 60 MG/ML SOLN injection Inject 60 mg into the skin every 6 (six) months.       . terazosin (HYTRIN) 5 MG capsule Take 1 capsule (5 mg total) by mouth daily.  90 capsule  2  . warfarin (COUMADIN) 1 MG tablet Take 0.5-1 mg by mouth daily. 0.5 tab on Mondays and Fridays; 1 tab daily otherwise      . warfarin (COUMADIN) 1 MG tablet Take as directed by  coumadin clinic  90 tablet  1  . zolpidem (AMBIEN) 10 MG tablet Take 10 mg by mouth at bedtime as needed.        No current facility-administered medications for this visit.     Past Medical History  Diagnosis Date  . Atrial fibrillation     on chronic coumadin  . Hyperlipidemia   . Bronchiectasis without acute exacerbation   . Disorders of diaphragm   . Plantar fascial fibromatosis   . Epistaxis   . Unspecified sinusitis (chronic)   . Allergic rhinitis, cause unspecified   . Spinal stenosis, unspecified region other than cervical   . Hypertension   . Coronary artery disease     s/p CABG 1993, s/p PCI of SVG to  OM '03  . Tachy-brady syndrome     s/p Medtronic PPM '07  . Myocardial infarction   . Pneumonia     " multiple times "  . Shortness of breath   . GERD (gastroesophageal reflux disease)   . Arthritis     Past Surgical History  Procedure Laterality Date  . Coronary artery bypass graft      LIMA to LAD, SVG to OM, SVG to left circumflex, SVG to PD/PLSA  . Pacemaker insertion      2007, Medtronic dual-chamber  . Kidney cyst removal    . Doppler echocardiography  2011  . Cholecystectomy      History   Social History  . Marital Status: Married    Spouse Name: N/A    Number of Children: 4  . Years of Education: N/A   Occupational History  .     Social History Main Topics  . Smoking status: Never Smoker   . Smokeless tobacco: Never Used  . Alcohol Use: No  . Drug Use: No  . Sexual  Activity: No   Other Topics Concern  . Not on file   Social History Narrative  . No narrative on file    ROS: no fevers or chills, productive cough, hemoptysis, dysphasia, odynophagia, melena, hematochezia, dysuria, hematuria, rash, seizure activity, orthopnea, PND, pedal edema, claudication. Remaining systems are negative.  Physical Exam: Well-developed well-nourished in no acute distress.  Skin is warm and dry.  HEENT is normal.  Neck is supple.  Chest is clear to auscultation with normal expansion.  Cardiovascular exam is regular rate and rhythm.  Abdominal exam nontender or distended. No masses palpated. Extremities show no edema. neuro grossly intact  ECG 06/13/2013-ventricular pacing with underlying atrial fibrillation.

## 2013-08-14 DIAGNOSIS — E559 Vitamin D deficiency, unspecified: Secondary | ICD-10-CM | POA: Diagnosis not present

## 2013-08-14 DIAGNOSIS — M810 Age-related osteoporosis without current pathological fracture: Secondary | ICD-10-CM | POA: Diagnosis not present

## 2013-08-14 DIAGNOSIS — M5137 Other intervertebral disc degeneration, lumbosacral region: Secondary | ICD-10-CM | POA: Diagnosis not present

## 2013-08-27 DIAGNOSIS — R269 Unspecified abnormalities of gait and mobility: Secondary | ICD-10-CM | POA: Diagnosis not present

## 2013-08-27 DIAGNOSIS — M6281 Muscle weakness (generalized): Secondary | ICD-10-CM | POA: Diagnosis not present

## 2013-09-04 DIAGNOSIS — R269 Unspecified abnormalities of gait and mobility: Secondary | ICD-10-CM | POA: Diagnosis not present

## 2013-09-04 DIAGNOSIS — M6281 Muscle weakness (generalized): Secondary | ICD-10-CM | POA: Diagnosis not present

## 2013-09-06 DIAGNOSIS — M6281 Muscle weakness (generalized): Secondary | ICD-10-CM | POA: Diagnosis not present

## 2013-09-06 DIAGNOSIS — R269 Unspecified abnormalities of gait and mobility: Secondary | ICD-10-CM | POA: Diagnosis not present

## 2013-09-07 DIAGNOSIS — E559 Vitamin D deficiency, unspecified: Secondary | ICD-10-CM | POA: Diagnosis not present

## 2013-09-07 DIAGNOSIS — Z79899 Other long term (current) drug therapy: Secondary | ICD-10-CM | POA: Diagnosis not present

## 2013-09-07 DIAGNOSIS — M810 Age-related osteoporosis without current pathological fracture: Secondary | ICD-10-CM | POA: Diagnosis not present

## 2013-09-13 DIAGNOSIS — R269 Unspecified abnormalities of gait and mobility: Secondary | ICD-10-CM | POA: Diagnosis not present

## 2013-09-13 DIAGNOSIS — M6281 Muscle weakness (generalized): Secondary | ICD-10-CM | POA: Diagnosis not present

## 2013-09-17 ENCOUNTER — Other Ambulatory Visit (HOSPITAL_COMMUNITY): Payer: Medicare Other

## 2013-09-17 ENCOUNTER — Ambulatory Visit (INDEPENDENT_AMBULATORY_CARE_PROVIDER_SITE_OTHER): Payer: Medicare Other

## 2013-09-17 ENCOUNTER — Ambulatory Visit (HOSPITAL_COMMUNITY): Payer: Medicare Other | Attending: Cardiology | Admitting: Radiology

## 2013-09-17 DIAGNOSIS — E785 Hyperlipidemia, unspecified: Secondary | ICD-10-CM | POA: Diagnosis not present

## 2013-09-17 DIAGNOSIS — I079 Rheumatic tricuspid valve disease, unspecified: Secondary | ICD-10-CM | POA: Diagnosis not present

## 2013-09-17 DIAGNOSIS — I2510 Atherosclerotic heart disease of native coronary artery without angina pectoris: Secondary | ICD-10-CM | POA: Diagnosis not present

## 2013-09-17 DIAGNOSIS — I4891 Unspecified atrial fibrillation: Secondary | ICD-10-CM

## 2013-09-17 DIAGNOSIS — I252 Old myocardial infarction: Secondary | ICD-10-CM | POA: Diagnosis not present

## 2013-09-17 DIAGNOSIS — I495 Sick sinus syndrome: Secondary | ICD-10-CM | POA: Insufficient documentation

## 2013-09-17 DIAGNOSIS — I379 Nonrheumatic pulmonary valve disorder, unspecified: Secondary | ICD-10-CM | POA: Insufficient documentation

## 2013-09-17 DIAGNOSIS — Z7901 Long term (current) use of anticoagulants: Secondary | ICD-10-CM | POA: Diagnosis not present

## 2013-09-17 DIAGNOSIS — I080 Rheumatic disorders of both mitral and aortic valves: Secondary | ICD-10-CM | POA: Insufficient documentation

## 2013-09-17 LAB — POCT INR: INR: 3.6

## 2013-09-17 NOTE — Progress Notes (Signed)
Echocardiogram performed.  

## 2013-09-20 DIAGNOSIS — M6281 Muscle weakness (generalized): Secondary | ICD-10-CM | POA: Diagnosis not present

## 2013-09-20 DIAGNOSIS — R269 Unspecified abnormalities of gait and mobility: Secondary | ICD-10-CM | POA: Diagnosis not present

## 2013-09-24 DIAGNOSIS — Z23 Encounter for immunization: Secondary | ICD-10-CM | POA: Diagnosis not present

## 2013-09-25 DIAGNOSIS — M6281 Muscle weakness (generalized): Secondary | ICD-10-CM | POA: Diagnosis not present

## 2013-09-25 DIAGNOSIS — R269 Unspecified abnormalities of gait and mobility: Secondary | ICD-10-CM | POA: Diagnosis not present

## 2013-09-25 DIAGNOSIS — M25569 Pain in unspecified knee: Secondary | ICD-10-CM | POA: Diagnosis not present

## 2013-10-02 DIAGNOSIS — M6281 Muscle weakness (generalized): Secondary | ICD-10-CM | POA: Diagnosis not present

## 2013-10-02 DIAGNOSIS — R269 Unspecified abnormalities of gait and mobility: Secondary | ICD-10-CM | POA: Diagnosis not present

## 2013-10-08 ENCOUNTER — Ambulatory Visit (INDEPENDENT_AMBULATORY_CARE_PROVIDER_SITE_OTHER): Payer: Medicare Other | Admitting: Pharmacist

## 2013-10-08 DIAGNOSIS — I4891 Unspecified atrial fibrillation: Secondary | ICD-10-CM

## 2013-10-08 DIAGNOSIS — Z7901 Long term (current) use of anticoagulants: Secondary | ICD-10-CM | POA: Diagnosis not present

## 2013-10-09 DIAGNOSIS — M6281 Muscle weakness (generalized): Secondary | ICD-10-CM | POA: Diagnosis not present

## 2013-10-10 ENCOUNTER — Ambulatory Visit (INDEPENDENT_AMBULATORY_CARE_PROVIDER_SITE_OTHER): Payer: Medicare Other | Admitting: Internal Medicine

## 2013-10-10 ENCOUNTER — Encounter: Payer: Self-pay | Admitting: Internal Medicine

## 2013-10-10 VITALS — BP 136/86 | HR 85 | Temp 98.7°F | Resp 16 | Ht 70.0 in | Wt 172.2 lb

## 2013-10-10 DIAGNOSIS — I10 Essential (primary) hypertension: Secondary | ICD-10-CM | POA: Diagnosis not present

## 2013-10-10 DIAGNOSIS — I4891 Unspecified atrial fibrillation: Secondary | ICD-10-CM

## 2013-10-10 NOTE — Progress Notes (Signed)
  Subjective:    Patient ID: Dwayne [name], male    DOB: 13-May-1932, 79 y.o.   MRN: [id number]  Hypertension This is a chronic problem. The current episode started more than 1 year ago. The problem is controlled. Pertinent negatives include no anxiety, blurred vision, chest pain, headaches, malaise/fatigue, neck pain, orthopnea, palpitations, peripheral edema, PND, shortness of breath or sweats. Past treatments include beta blockers and alpha 1 blockers. The current treatment provides significant improvement. There are no compliance problems.       Review of Systems  Constitutional: Negative.  Negative for fever, chills, malaise/fatigue, diaphoresis, appetite change and fatigue.  HENT: Negative.   Eyes: Negative.  Negative for blurred vision.  Respiratory: Negative.  Negative for cough, choking, chest tightness, shortness of breath, wheezing and stridor.   Cardiovascular: Negative.  Negative for chest pain, palpitations, orthopnea, leg swelling and PND.  Gastrointestinal: Negative.  Negative for nausea, vomiting, abdominal pain, diarrhea, constipation and blood in stool.  Endocrine: Negative.   Genitourinary: Negative.   Musculoskeletal: Negative.  Negative for neck pain.  Skin: Negative.   Allergic/Immunologic: Negative.   Neurological: Negative.  Negative for dizziness, tremors, speech difficulty, weakness, light-headedness and headaches.  Hematological: Negative.  Negative for adenopathy. Does not bruise/bleed easily.  Psychiatric/Behavioral: Negative.        Objective:   Physical Exam  Vitals reviewed. Constitutional: He is oriented to person, place, and time. He appears well-developed and well-nourished. No distress.  HENT:  Head: Normocephalic and atraumatic.  Mouth/Throat: Oropharynx is clear and moist. No oropharyngeal exudate.  Eyes: Conjunctivae are normal. Right eye exhibits no discharge. Left eye exhibits no discharge. No scleral icterus.  Neck: Normal range of motion.  Neck supple. No JVD present. No tracheal deviation present. No thyromegaly present.  Cardiovascular: Normal rate, regular rhythm, normal heart sounds and intact distal pulses.  Exam reveals no gallop and no friction rub.   No murmur heard. Pulmonary/Chest: Effort normal and breath sounds normal. No stridor. No respiratory distress. He has no wheezes. He has no rales. He exhibits no tenderness.  Abdominal: Soft. Bowel sounds are normal. He exhibits no distension and no mass. There is no tenderness. There is no rebound and no guarding.  Musculoskeletal: Normal range of motion. He exhibits no edema and no tenderness.  Lymphadenopathy:    He has no cervical adenopathy.  Neurological: He is oriented to person, place, and time.  Skin: Skin is warm and dry. No rash noted. He is not diaphoretic. No erythema. No pallor.  Psychiatric: He has a normal mood and affect. His behavior is normal. Judgment and thought content normal.     Lab Results  Component Value Date   WBC 10.2 06/20/2013   HGB 13.0 06/20/2013   HCT 39.8 06/20/2013   PLT 238.0 06/20/2013   GLUCOSE 120* 06/20/2013   CHOL 137 06/20/2013   TRIG 104.0 06/20/2013   HDL 48.50 06/20/2013   LDLCALC 68 06/20/2013   ALT 16 06/20/2013   AST 21 06/20/2013   NA 141 06/20/2013   K 4.0 06/20/2013   CL 109 06/20/2013   CREATININE 0.8 06/20/2013   BUN 16 06/20/2013   CO2 24 06/20/2013   TSH 0.90 02/22/2012   INR 2.2 10/08/2013   HGBA1C 6.2 03/14/2012       Assessment & Plan:

## 2013-10-10 NOTE — Patient Instructions (Signed)

## 2013-10-11 DIAGNOSIS — M6281 Muscle weakness (generalized): Secondary | ICD-10-CM | POA: Diagnosis not present

## 2013-10-11 DIAGNOSIS — R269 Unspecified abnormalities of gait and mobility: Secondary | ICD-10-CM | POA: Diagnosis not present

## 2013-10-11 NOTE — Assessment & Plan Note (Signed)
He has good rate and rhythm control 

## 2013-10-11 NOTE — Assessment & Plan Note (Signed)
His BP is well controlled 

## 2013-10-16 DIAGNOSIS — M6281 Muscle weakness (generalized): Secondary | ICD-10-CM | POA: Diagnosis not present

## 2013-10-16 DIAGNOSIS — R269 Unspecified abnormalities of gait and mobility: Secondary | ICD-10-CM | POA: Diagnosis not present

## 2013-10-18 DIAGNOSIS — M6281 Muscle weakness (generalized): Secondary | ICD-10-CM | POA: Diagnosis not present

## 2013-10-18 DIAGNOSIS — R269 Unspecified abnormalities of gait and mobility: Secondary | ICD-10-CM | POA: Diagnosis not present

## 2013-11-05 ENCOUNTER — Ambulatory Visit (INDEPENDENT_AMBULATORY_CARE_PROVIDER_SITE_OTHER): Payer: Medicare Other | Admitting: *Deleted

## 2013-11-05 DIAGNOSIS — I4891 Unspecified atrial fibrillation: Secondary | ICD-10-CM | POA: Diagnosis not present

## 2013-11-05 DIAGNOSIS — Z7901 Long term (current) use of anticoagulants: Secondary | ICD-10-CM

## 2013-12-04 DIAGNOSIS — R5381 Other malaise: Secondary | ICD-10-CM | POA: Diagnosis not present

## 2013-12-04 DIAGNOSIS — E559 Vitamin D deficiency, unspecified: Secondary | ICD-10-CM | POA: Diagnosis not present

## 2013-12-04 DIAGNOSIS — Z79899 Other long term (current) drug therapy: Secondary | ICD-10-CM | POA: Diagnosis not present

## 2013-12-07 ENCOUNTER — Emergency Department (HOSPITAL_COMMUNITY): Payer: Medicare Other

## 2013-12-07 ENCOUNTER — Inpatient Hospital Stay (HOSPITAL_COMMUNITY)
Admission: EM | Admit: 2013-12-07 | Discharge: 2013-12-12 | DRG: 470 | Disposition: A | Discharge status: Skilled Nursing Facility | Payer: Medicare Other | Attending: Internal Medicine | Admitting: Internal Medicine

## 2013-12-07 ENCOUNTER — Encounter (HOSPITAL_COMMUNITY): Payer: Self-pay | Admitting: Emergency Medicine

## 2013-12-07 DIAGNOSIS — R279 Unspecified lack of coordination: Secondary | ICD-10-CM | POA: Diagnosis not present

## 2013-12-07 DIAGNOSIS — I10 Essential (primary) hypertension: Secondary | ICD-10-CM | POA: Diagnosis present

## 2013-12-07 DIAGNOSIS — N400 Enlarged prostate without lower urinary tract symptoms: Secondary | ICD-10-CM | POA: Diagnosis not present

## 2013-12-07 DIAGNOSIS — M25559 Pain in unspecified hip: Secondary | ICD-10-CM | POA: Diagnosis not present

## 2013-12-07 DIAGNOSIS — S72043B Displaced fracture of base of neck of unspecified femur, initial encounter for open fracture type I or II: Secondary | ICD-10-CM | POA: Diagnosis not present

## 2013-12-07 DIAGNOSIS — I25810 Atherosclerosis of coronary artery bypass graft(s) without angina pectoris: Secondary | ICD-10-CM | POA: Diagnosis not present

## 2013-12-07 DIAGNOSIS — Z950 Presence of cardiac pacemaker: Secondary | ICD-10-CM

## 2013-12-07 DIAGNOSIS — I4891 Unspecified atrial fibrillation: Secondary | ICD-10-CM | POA: Diagnosis not present

## 2013-12-07 DIAGNOSIS — Z471 Aftercare following joint replacement surgery: Secondary | ICD-10-CM | POA: Diagnosis not present

## 2013-12-07 DIAGNOSIS — I2510 Atherosclerotic heart disease of native coronary artery without angina pectoris: Secondary | ICD-10-CM | POA: Diagnosis present

## 2013-12-07 DIAGNOSIS — Z9181 History of falling: Secondary | ICD-10-CM | POA: Diagnosis not present

## 2013-12-07 DIAGNOSIS — K219 Gastro-esophageal reflux disease without esophagitis: Secondary | ICD-10-CM | POA: Diagnosis not present

## 2013-12-07 DIAGNOSIS — W19XXXA Unspecified fall, initial encounter: Secondary | ICD-10-CM | POA: Diagnosis present

## 2013-12-07 DIAGNOSIS — R791 Abnormal coagulation profile: Secondary | ICD-10-CM | POA: Diagnosis present

## 2013-12-07 DIAGNOSIS — S72033A Displaced midcervical fracture of unspecified femur, initial encounter for closed fracture: Secondary | ICD-10-CM | POA: Diagnosis not present

## 2013-12-07 DIAGNOSIS — S72009A Fracture of unspecified part of neck of unspecified femur, initial encounter for closed fracture: Secondary | ICD-10-CM | POA: Diagnosis not present

## 2013-12-07 DIAGNOSIS — E785 Hyperlipidemia, unspecified: Secondary | ICD-10-CM | POA: Diagnosis present

## 2013-12-07 DIAGNOSIS — M129 Arthropathy, unspecified: Secondary | ICD-10-CM | POA: Diagnosis present

## 2013-12-07 DIAGNOSIS — M6281 Muscle weakness (generalized): Secondary | ICD-10-CM | POA: Diagnosis not present

## 2013-12-07 DIAGNOSIS — Z7901 Long term (current) use of anticoagulants: Secondary | ICD-10-CM | POA: Diagnosis not present

## 2013-12-07 DIAGNOSIS — D72829 Elevated white blood cell count, unspecified: Secondary | ICD-10-CM | POA: Diagnosis not present

## 2013-12-07 DIAGNOSIS — N390 Urinary tract infection, site not specified: Secondary | ICD-10-CM | POA: Diagnosis not present

## 2013-12-07 DIAGNOSIS — I252 Old myocardial infarction: Secondary | ICD-10-CM | POA: Diagnosis not present

## 2013-12-07 DIAGNOSIS — Z7982 Long term (current) use of aspirin: Secondary | ICD-10-CM

## 2013-12-07 DIAGNOSIS — S72001A Fracture of unspecified part of neck of right femur, initial encounter for closed fracture: Secondary | ICD-10-CM

## 2013-12-07 DIAGNOSIS — S0990XA Unspecified injury of head, initial encounter: Secondary | ICD-10-CM | POA: Diagnosis not present

## 2013-12-07 DIAGNOSIS — S298XXA Other specified injuries of thorax, initial encounter: Secondary | ICD-10-CM | POA: Diagnosis not present

## 2013-12-07 DIAGNOSIS — Z96649 Presence of unspecified artificial hip joint: Secondary | ICD-10-CM | POA: Diagnosis not present

## 2013-12-07 DIAGNOSIS — Z951 Presence of aortocoronary bypass graft: Secondary | ICD-10-CM

## 2013-12-07 DIAGNOSIS — R262 Difficulty in walking, not elsewhere classified: Secondary | ICD-10-CM | POA: Diagnosis not present

## 2013-12-07 DIAGNOSIS — I447 Left bundle-branch block, unspecified: Secondary | ICD-10-CM | POA: Diagnosis not present

## 2013-12-07 DIAGNOSIS — R8271 Bacteriuria: Secondary | ICD-10-CM | POA: Diagnosis present

## 2013-12-07 DIAGNOSIS — R82998 Other abnormal findings in urine: Secondary | ICD-10-CM | POA: Diagnosis not present

## 2013-12-07 DIAGNOSIS — Z87440 Personal history of urinary (tract) infections: Secondary | ICD-10-CM | POA: Diagnosis not present

## 2013-12-07 DIAGNOSIS — T148XXA Other injury of unspecified body region, initial encounter: Secondary | ICD-10-CM | POA: Diagnosis not present

## 2013-12-07 DIAGNOSIS — Z5189 Encounter for other specified aftercare: Secondary | ICD-10-CM | POA: Diagnosis not present

## 2013-12-07 LAB — HEPATIC FUNCTION PANEL
ALT: 20 U/L (ref 0–53)
Alkaline Phosphatase: 68 U/L (ref 39–117)
Bilirubin, Direct: <0.1 mg/dL (ref 0.0–0.3)
Total Protein: 7.3 g/dL (ref 6.0–8.3)

## 2013-12-07 LAB — CBC WITH DIFFERENTIAL/PLATELET
Basophils Relative: 1 % (ref 0–1)
Eosinophils Absolute: 0.5 10*3/uL (ref 0.0–0.7)
Eosinophils Relative: 5 % (ref 0–5)
HCT: 39.1 % (ref 39.0–52.0)
Lymphs Abs: 1.7 10*3/uL (ref 0.7–4.0)
MCH: 30.7 pg (ref 26.0–34.0)
MCHC: 32.7 g/dL (ref 30.0–36.0)
MCV: 93.8 fL (ref 78.0–100.0)
Monocytes Absolute: 0.8 10*3/uL (ref 0.1–1.0)
Monocytes Relative: 9 % (ref 3–12)
Neutrophils Relative %: 67 % (ref 43–77)
Platelets: 278 10*3/uL (ref 150–400)
RBC: 4.17 MIL/uL — ABNORMAL LOW (ref 4.22–5.81)
WBC: 9.3 10*3/uL (ref 4.0–10.5)

## 2013-12-07 LAB — BASIC METABOLIC PANEL
BUN: 13 mg/dL (ref 6–23)
Calcium: 9.1 mg/dL (ref 8.4–10.5)
GFR calc Af Amer: >90 mL/min (ref >90)
GFR calc non Af Amer: 82 mL/min — ABNORMAL LOW (ref >90)
Potassium: 3.5 mEq/L (ref 3.5–5.1)
Sodium: 139 mEq/L (ref 135–145)

## 2013-12-07 MED ORDER — METOPROLOL TARTRATE 25 MG PO TABS
25.0000 mg | ORAL_TABLET | Freq: Two times a day (BID) | ORAL | Status: DC
  Administered 2013-12-07 – 2013-12-12 (×11): 25 mg via ORAL
  Filled 2013-12-07 (×12): qty 1

## 2013-12-07 MED ORDER — CALCIUM-MAGNESIUM-VITAMIN D ER 600-40-500 MG-MG-UNIT PO TB24: 1.0000 | ORAL_TABLET | Freq: Two times a day (BID) | ORAL | Status: DC

## 2013-12-07 MED ORDER — NITROGLYCERIN 0.4 MG SL SUBL: 0.4000 mg | SUBLINGUAL_TABLET | SUBLINGUAL | Status: DC | PRN

## 2013-12-07 MED ORDER — TERAZOSIN HCL 5 MG PO CAPS
5.0000 mg | ORAL_CAPSULE | Freq: Every day | ORAL | Status: DC
  Administered 2013-12-07 – 2013-12-12 (×5): 5 mg via ORAL
  Filled 2013-12-07 (×6): qty 1

## 2013-12-07 MED ORDER — SODIUM CHLORIDE 0.9 % IJ SOLN
3.0000 mL | Freq: Two times a day (BID) | INTRAMUSCULAR | Status: DC
  Administered 2013-12-08 – 2013-12-10 (×3): 3 mL via INTRAVENOUS

## 2013-12-07 MED ORDER — MORPHINE SULFATE 4 MG/ML IJ SOLN
4.0000 mg | Freq: Once | INTRAMUSCULAR | Status: AC
  Administered 2013-12-07: 4 mg via INTRAVENOUS
  Filled 2013-12-07: qty 1

## 2013-12-07 MED ORDER — SODIUM CHLORIDE 0.9 % IV SOLN
INTRAVENOUS | Status: DC
  Administered 2013-12-07 – 2013-12-11 (×3): via INTRAVENOUS

## 2013-12-07 MED ORDER — ACETAMINOPHEN 325 MG PO TABS
650.0000 mg | ORAL_TABLET | Freq: Four times a day (QID) | ORAL | Status: DC | PRN
  Administered 2013-12-08 – 2013-12-09 (×2): 650 mg via ORAL
  Filled 2013-12-07 (×2): qty 2

## 2013-12-07 MED ORDER — ACETAMINOPHEN 650 MG RE SUPP: 650.0000 mg | Freq: Four times a day (QID) | RECTAL | Status: DC | PRN

## 2013-12-07 MED ORDER — ATORVASTATIN CALCIUM 20 MG PO TABS
20.0000 mg | ORAL_TABLET | Freq: Every day | ORAL | Status: DC
  Administered 2013-12-07 – 2013-12-09 (×3): 20 mg via ORAL
  Filled 2013-12-07 (×6): qty 1

## 2013-12-07 MED ORDER — ENOXAPARIN SODIUM 40 MG/0.4ML ~~LOC~~ SOLN
40.0000 mg | SUBCUTANEOUS | Status: DC
  Administered 2013-12-07 – 2013-12-08 (×2): 40 mg via SUBCUTANEOUS
  Filled 2013-12-07 (×3): qty 0.4

## 2013-12-07 MED ORDER — FLUTICASONE PROPIONATE 50 MCG/ACT NA SUSP
2.0000 | Freq: Every day | NASAL | Status: DC
  Administered 2013-12-07 – 2013-12-12 (×6): 2 via NASAL
  Filled 2013-12-07: qty 16

## 2013-12-07 MED ORDER — CALCIUM CARBONATE-VITAMIN D 500-200 MG-UNIT PO TABS
1.0000 | ORAL_TABLET | Freq: Two times a day (BID) | ORAL | Status: DC
  Administered 2013-12-07 – 2013-12-12 (×10): 1 via ORAL
  Filled 2013-12-07 (×12): qty 1

## 2013-12-07 MED ORDER — FINASTERIDE 5 MG PO TABS
5.0000 mg | ORAL_TABLET | Freq: Every day | ORAL | Status: DC
  Administered 2013-12-07 – 2013-12-11 (×5): 5 mg via ORAL
  Filled 2013-12-07 (×7): qty 1

## 2013-12-07 MED ORDER — HYDROMORPHONE HCL PF 1 MG/ML IJ SOLN: 0.5000 mg | INTRAMUSCULAR | Status: AC | PRN

## 2013-12-07 MED ORDER — SENNOSIDES-DOCUSATE SODIUM 8.6-50 MG PO TABS: 1.0000 | ORAL_TABLET | Freq: Every evening | ORAL | Status: DC | PRN

## 2013-12-07 MED ORDER — MAGNESIUM CITRATE PO SOLN: 1.0000 | Freq: Once | ORAL | Status: AC | PRN

## 2013-12-07 MED ORDER — PANTOPRAZOLE SODIUM 40 MG PO TBEC
40.0000 mg | DELAYED_RELEASE_TABLET | Freq: Every day | ORAL | Status: DC
  Administered 2013-12-07 – 2013-12-11 (×5): 40 mg via ORAL
  Filled 2013-12-07 (×4): qty 1

## 2013-12-07 MED ORDER — VITAMIN K1 10 MG/ML IJ SOLN
10.0000 mg | Freq: Once | INTRAVENOUS | Status: AC
  Administered 2013-12-07: 10 mg via INTRAVENOUS
  Filled 2013-12-07: qty 1

## 2013-12-07 MED ORDER — POLYETHYLENE GLYCOL 3350 17 G PO PACK
17.0000 g | PACK | Freq: Every day | ORAL | Status: DC
  Administered 2013-12-07 – 2013-12-12 (×5): 17 g via ORAL
  Filled 2013-12-07 (×7): qty 1

## 2013-12-07 MED ORDER — MORPHINE SULFATE 4 MG/ML IJ SOLN
4.0000 mg | INTRAMUSCULAR | Status: DC | PRN
  Administered 2013-12-07 (×3): 4 mg via INTRAVENOUS
  Filled 2013-12-07 (×3): qty 1

## 2013-12-07 MED ORDER — SORBITOL 70 % SOLN: 30.0000 mL | Freq: Every day | Status: DC | PRN

## 2013-12-07 MED ORDER — OXYCODONE HCL 5 MG PO TABS
5.0000 mg | ORAL_TABLET | ORAL | Status: DC | PRN
  Administered 2013-12-07 – 2013-12-11 (×10): 5 mg via ORAL
  Filled 2013-12-07 (×10): qty 1

## 2013-12-07 MED ORDER — LORATADINE 10 MG PO TABS
10.0000 mg | ORAL_TABLET | Freq: Every day | ORAL | Status: DC
  Administered 2013-12-07 – 2013-12-12 (×5): 10 mg via ORAL
  Filled 2013-12-07 (×6): qty 1

## 2013-12-07 NOTE — Progress Notes (Signed)
Orthopedic Tech Progress Note Patient Details:  Dwayne [name] September 19, 1932 [id number]  Musculoskeletal Traction Type of Traction: [place] Skin Traction Traction Location: RLE Traction Weight: 5 lbs    Jennye Moccasin 12/07/2013, 7:27 PM

## 2013-12-07 NOTE — ED Notes (Signed)
Food tray heart health ordered for patient.

## 2013-12-07 NOTE — ED Notes (Signed)
Pt transported to xray 

## 2013-12-07 NOTE — Consult Note (Signed)
Reason for Consult: right femoral neck fracture Referring Physician: hospitalist  Dwayne [name] is an 79 y.o. male.  HPI: Pt with mechanical fall earlier today and found by his wife.  Brought to ED and c/o right hip pain.  Radiographs show displaced right femoral neck fracture.  Pt will require surgical intervention for right hip hemiarthroplasty however currently anticoagulated for atrial fib.  Pt will be placed on OR schedule for right hip hemiarthroplasty on Monday after reversal of coumadin.  Past Medical History  Diagnosis Date  . Atrial fibrillation     on chronic coumadin  . Hyperlipidemia   . Bronchiectasis without acute exacerbation   . Disorders of diaphragm   . Plantar fascial fibromatosis   . Epistaxis   . Unspecified sinusitis (chronic)   . Allergic rhinitis, cause unspecified   . Spinal stenosis, unspecified region other than cervical   . Hypertension   . Coronary artery disease     s/p CABG 1993, s/p PCI of SVG to OM '03  . Tachy-brady syndrome     s/p Medtronic PPM '07  . Myocardial infarction   . Pneumonia     " multiple times "  . Shortness of breath   . GERD (gastroesophageal reflux disease)   . Arthritis     Past Surgical History  Procedure Laterality Date  . Coronary artery bypass graft      LIMA to LAD, SVG to OM, SVG to left circumflex, SVG to PD/PLSA  . Pacemaker insertion      2007, Medtronic dual-chamber  . Kidney cyst removal    . Doppler echocardiography  2011  . Cholecystectomy    . Vertebroplasty    . Cataract extraction w/ intraocular lens  implant, bilateral      Family History  Problem Relation Age of Onset  . Cancer Neg Hx   . Heart disease Neg Hx   . Stroke Father     Social History:  reports that he has never smoked. He has never used smokeless tobacco. He reports that he does not drink alcohol or use illicit drugs.  Allergies:  Allergies  Allergen Reactions  . Clindamycin     REACTION: upset stomach  . Doxycycline Hyclate      REACTION: n/v/d  . Erythromycin     REACTION: n/v/d  . Metronidazole     REACTION: rash, itching  . Penicillins     REACTION: hives  . Sulfonamide Derivatives     REACTION: hives  . Tetracycline     REACTION: n/v/d    Medications: I have reviewed the patient's current medications.  Results for orders placed during the hospital encounter of 12/07/13 (from the past 48 hour(s))  CBC WITH DIFFERENTIAL     Status: Abnormal   Collection Time    12/07/13  6:43 AM      Result Value Range   WBC 9.3  4.0 - 10.5 K/uL   RBC 4.17 (*) 4.22 - 5.81 MIL/uL   Hemoglobin 12.8 (*) 13.0 - 17.0 g/dL   HCT 45.4  09.8 - 11.9 %   MCV 93.8  78.0 - 100.0 fL   MCH 30.7  26.0 - 34.0 pg   MCHC 32.7  30.0 - 36.0 g/dL   RDW 14.7  82.9 - 56.2 %   Platelets 278  150 - 400 K/uL   Neutrophils Relative % 67  43 - 77 %   Neutro Abs 6.2  1.7 - 7.7 K/uL   Lymphocytes Relative 19  12 -  46 %   Lymphs Abs 1.7  0.7 - 4.0 K/uL   Monocytes Relative 9  3 - 12 %   Monocytes Absolute 0.8  0.1 - 1.0 K/uL   Eosinophils Relative 5  0 - 5 %   Eosinophils Absolute 0.5  0.0 - 0.7 K/uL   Basophils Relative 1  0 - 1 %   Basophils Absolute 0.1  0.0 - 0.1 K/uL  BASIC METABOLIC PANEL     Status: Abnormal   Collection Time    12/07/13  6:43 AM      Result Value Range   Sodium 139  135 - 145 mEq/L   Potassium 3.5  3.5 - 5.1 mEq/L   Chloride 106  96 - 112 mEq/L   CO2 26  19 - 32 mEq/L   Glucose, Bld 103 (*) 70 - 99 mg/dL   BUN 13  6 - 23 mg/dL   Creatinine, Ser 2.95  0.50 - 1.35 mg/dL   Calcium 9.1  8.4 - 62.1 mg/dL   GFR calc non Af Amer 82 (*) >90 mL/min   GFR calc Af Amer >90  >90 mL/min   Comment: (NOTE)     The eGFR has been calculated using the CKD EPI equation.     This calculation has not been validated in all clinical situations.     eGFR's persistently <90 mL/min signify possible Chronic Kidney     Disease.  PROTIME-INR     Status: Abnormal   Collection Time    12/07/13  6:43 AM      Result Value Range    Prothrombin Time 24.7 (*) 11.6 - 15.2 seconds   INR 2.32 (*) 0.00 - 1.49  HEPATIC FUNCTION PANEL     Status: None   Collection Time    12/07/13  6:43 AM      Result Value Range   Total Protein 7.3  6.0 - 8.3 g/dL   Albumin 3.6  3.5 - 5.2 g/dL   AST 28  0 - 37 U/L   ALT 20  0 - 53 U/L   Alkaline Phosphatase 68  39 - 117 U/L   Total Bilirubin 0.3  0.3 - 1.2 mg/dL   Bilirubin, Direct <3.0  0.0 - 0.3 mg/dL   Comment: CORRECTED RESULTS CALLED TO:     A.PRIDDY,RN 1422 12/07/13 CLARK,S     CORRECTED ON 12/19 AT 1420: PREVIOUSLY REPORTED AS <0.0   Indirect Bilirubin NOT CALCULATED  0.3 - 0.9 mg/dL    Dg Chest 1 View  86/57/8469   CLINICAL DATA:  Fall  EXAM: CHEST - 1 VIEW  COMPARISON:  06/13/2013  FINDINGS: Chronic elevation of the left hemidiaphragm as before. Minimal basilar atelectasis versus scarring. Prior coronary bypass and left subclavian pacemaker. No definite pneumonia or edema. Negative for effusion or pneumothorax. Stable exam.  IMPRESSION: Stable chest exam without superimposed acute process.   Electronically Signed   By: Ruel Favors M.D.   On: 12/07/2013 07:27   Dg Hip Complete Right  12/07/2013   CLINICAL DATA:  Fall, right hip pain  EXAM: RIGHT HIP - COMPLETE 2+ VIEW  COMPARISON:  None.  FINDINGS: There is an acute impacted right hip subcapital femoral neck fracture. No associated subluxation or dislocation. Bony pelvis and left hip intact. Previous L4 vertebral augmentation noted. Nonobstructive bowel gas pattern.  IMPRESSION: Acute right hip subcapital femoral neck fracture   Electronically Signed   By: Ruel Favors M.D.   On: 12/07/2013 07:24   Ct  Head Wo Contrast  12/07/2013   CLINICAL DATA:  Fall.  EXAM: CT HEAD WITHOUT CONTRAST  TECHNIQUE: Contiguous axial images were obtained from the base of the skull through the vertex without intravenous contrast.  COMPARISON:  CT 06/13/2013  FINDINGS: Moderate atrophy, unchanged. Mild chronic microvascular ischemic change in the  white matter. No acute infarct, hemorrhage, or mass. Negative for subdural hematoma. Negative for skull fracture.  IMPRESSION: Generalized atrophy. Mild chronic microvascular ischemia. No acute abnormality.   Electronically Signed   By: Marlan Palau M.D.   On: 12/07/2013 08:04    Review of Systems  Musculoskeletal: Positive for joint pain.       Right hip pain   Blood pressure 172/87, pulse 72, temperature 97.7 F (36.5 C), temperature source Oral, resp. rate 18, SpO2 98.00%. Physical Exam  Constitutional: He is oriented to person, place, and time. He appears well-developed and well-nourished.  HENT:  Head: Normocephalic.  Neck: Normal range of motion.  Musculoskeletal:  Shortening of right lower extremity.  Sensation intact LEs.  No edema.  Painful motion of right leg.  Neurological: He is alert and oriented to person, place, and time.  Skin: Skin is warm and dry.    Assessment/Plan: Right femoral neck fracture with displacement. Pt currently anticoagulated. PLAN:  Right hip hemiarthroplasty on Monday.  Per DR Ophelia Charter will give FFP, Lovenox and hold coumadin.  Coreon Simkins M 12/07/2013, 3:19 PM

## 2013-12-07 NOTE — H&P (Signed)
[facility] History and Physical  Dwayne [name]:[id number] DOB: May 26, 1932 DOA: 12/07/2013  Referring physician: Dr. Wayland Salinas PCP: Sanda Linger, MD   Chief Complaint: Fall with right hip pain.   History of Present Illness: Dwayne [name] is an 79 y.o. male with a PMH of CAD status post CABG, atrial fibrillation on chronic coumadin and s/p pacemaker for tachycardia-bradycardia syndrome who suffered a mechanical fall resulting in a right hip fracture.  The fall was un witnessed, the patient called out to his wife who found him on the floor in the bathroom.  No obvious syncope, but the patient is a poor historian and cannot say for certain. He did not have any complaints of chest pain or dizziness prior to the fall. He was unable to get up, so 911 was called and he was brought to the hospital via EMS where he was found to have the hip fracture.  The patient is currently sedated from having received pain medication.  Has not had any medication this morning.  Normally ambulates with a walker, but did not use this to get to the bathroom. The patient's family tells me that, in the past, when he has needed surgery, it took 4-5 days to reverse his INR. Patient is currently comfortable and not complaining of anything except for right hip pain and painful muscle spasms.  Review of Systems: Constitutional: No fever, no chills;  Appetite good; No weight loss, no weight gain, no fatigue.  HEENT: No blurry vision, no diplopia, no pharyngitis, no dysphagia CV: No chest pain, no palpitations, no PND.  Resp: No SOB, no cough, no pleuritic pain. GI: No nausea, no vomiting, no diarrhea, no melena, no hematochezia, no constipation.  GU: No dysuria, no hematuria, no frequency, no urgency, + recent onset urinary incontinence. MSK: no myalgias, + back arthralgias secondary to spinal stenosis.  Neuro:  No headache, no focal neurological deficits, no history of seizures.  Psych: No depression, no anxiety.  Endo: No  heat intolerance, no cold intolerance, no polyuria, no polydipsia  Skin: No rashes, no skin lesions.  Heme: No easy bruising.  Travel history: None in last 6 months.  Past Medical History Past Medical History  Diagnosis Date  . Atrial fibrillation     on chronic coumadin  . Hyperlipidemia   . Bronchiectasis without acute exacerbation   . Disorders of diaphragm   . Plantar fascial fibromatosis   . Epistaxis   . Unspecified sinusitis (chronic)   . Allergic rhinitis, cause unspecified   . Spinal stenosis, unspecified region other than cervical   . Hypertension   . Coronary artery disease     s/p CABG 1993, s/p PCI of SVG to OM '03  . Tachy-brady syndrome     s/p Medtronic PPM '07  . Myocardial infarction   . Pneumonia     " multiple times "  . Shortness of breath   . GERD (gastroesophageal reflux disease)   . Arthritis      Past Surgical History Past Surgical History  Procedure Laterality Date  . Coronary artery bypass graft      LIMA to LAD, SVG to OM, SVG to left circumflex, SVG to PD/PLSA  . Pacemaker insertion      2007, Medtronic dual-chamber  . Kidney cyst removal    . Doppler echocardiography  2011  . Cholecystectomy    . Vertebroplasty    . Cataract extraction w/ intraocular lens  implant, bilateral       Social  History: History   Social History  . Marital Status: Married    Spouse Name: Ollen Gross    Number of Children: 4  . Years of Education: N/A   Occupational History  .     Social History Main Topics  . Smoking status: Never Smoker   . Smokeless tobacco: Never Used  . Alcohol Use: No  . Drug Use: No  . Sexual Activity: No   Other Topics Concern  . Not on file   Social History Narrative   Married.  Ambulates with a walker.    Family History:  Family History  Problem Relation Age of Onset  . Cancer Neg Hx   . Heart disease Neg Hx   . Stroke Father     Allergies: Clindamycin; Doxycycline hyclate; Erythromycin; Metronidazole;  Penicillins; Sulfonamide derivatives; and Tetracycline  Meds: Prior to Admission medications   Medication Sig Start Date End Date Taking? Authorizing Provider  aspirin EC 81 MG tablet Take 81 mg by mouth daily.   Yes Historical Provider, MD  Calcium-Magnesium-Vitamin D (CITRACAL CALCIUM+D) 600-40-500 MG-MG-UNIT TB24 Take 1 tablet by mouth 2 (two) times daily.    Yes Historical Provider, MD  CRESTOR 10 MG tablet Take 1 tablet by mouth  daily 06/21/13  Yes [name], MD  fexofenadine (ALLEGRA) 180 MG tablet Take 180 mg by mouth at bedtime as needed (for allergies).    Yes Historical Provider, MD  finasteride (PROSCAR) 5 MG tablet Take 1 tablet (5 mg total) by mouth at bedtime. 03/08/13  Yes [name], MD  fluticasone (FLONASE) 50 MCG/ACT nasal spray Place 2 sprays into the nose daily as needed for rhinitis.   Yes Historical Provider, MD  metoprolol tartrate (LOPRESSOR) 25 MG tablet Take 1 tablet (25 mg total) by mouth 2 (two) times daily. 06/26/13  Yes Hillis Range, MD  nitroGLYCERIN (NITROSTAT) 0.4 MG SL tablet Place 0.4 mg under the tongue every 5 (five) minutes x 3 doses as needed for chest pain.   Yes Historical Provider, MD  omeprazole (PRILOSEC) 40 MG capsule Take 40 mg by mouth daily.     Yes Historical Provider, MD  polyethylene glycol (MIRALAX / GLYCOLAX) packet Take 17 g by mouth daily.   Yes Historical Provider, MD  PROLIA 60 MG/ML SOLN injection Inject 60 mg into the skin every 6 (six) months.  10/04/12  Yes Historical Provider, MD  terazosin (HYTRIN) 5 MG capsule Take 1 capsule (5 mg total) by mouth daily. 03/08/13  Yes [name], MD  warfarin (COUMADIN) 1 MG tablet Take 0.5-1 mg by mouth See admin instructions. 0.5 tab on Mondays and Fridays; 1 tab daily on Tuesday, Wednesday, Thursday, Saturday, Sunday   Yes Historical Provider, MD  zolpidem (AMBIEN) 10 MG tablet Take 10 mg by mouth at bedtime as needed. 11/24/13  Yes Historical Provider, MD    Physical Exam: Filed Vitals:    12/07/13 0736 12/07/13 0745 12/07/13 0800 12/07/13 0815  BP:  143/71 131/85 135/82  Pulse:  59 63 60  Temp:      TempSrc:      Resp:  18 19 20   SpO2: 97% 92% 97% 97%     Physical Exam: Blood pressure 135/82, pulse 60, temperature 97.8 F (36.6 C), temperature source Oral, resp. rate 20, SpO2 97.00%. Gen: No acute distress. Head: Normocephalic, atraumatic. Eyes: PERRL, EOMI, sclerae nonicteric. Lens implants noted. Mouth: Oropharynx clear with slightly dry mucous membranes. Neck: Supple, no thyromegaly, no lymphadenopathy, no jugular venous distention. Chest: Lungs  clear to auscultation bilaterally with good air movement. CV: Heart sounds are regular. No murmurs, rubs, or gallops. Abdomen: Soft, nontender, nondistended with normal active bowel sounds. Extremities: Extremities are without clubbing, edema, or cyanosis. Right leg shorter than left. Skin: Warm and dry. Neuro: Sleepy but awakens to stimulation; cranial nerves II through XII grossly intact. Psych: Mood and affect normal.  Labs on Admission:  Basic Metabolic Panel:  Recent Labs Lab 12/07/13 0643  NA 139  K 3.5  CL 106  CO2 26  GLUCOSE 103*  BUN 13  CREATININE 0.81  CALCIUM 9.1   Liver Function Tests: No results found for this basename: AST, ALT, ALKPHOS, BILITOT, PROT, ALBUMIN,  in the last 168 hours No results found for this basename: LIPASE, AMYLASE,  in the last 168 hours No results found for this basename: AMMONIA,  in the last 168 hours CBC:  Recent Labs Lab 12/07/13 0643  WBC 9.3  NEUTROABS 6.2  HGB 12.8*  HCT 39.1  MCV 93.8  PLT 278   Cardiac Enzymes: No results found for this basename: CKTOTAL, CKMB, CKMBINDEX, TROPONINI,  in the last 168 hours  BNP (last 3 results) No results found for this basename: PROBNP,  in the last 8760 hours CBG: No results found for this basename: GLUCAP,  in the last 168 hours  Radiological Exams on Admission: Dg Chest 1 View  12/07/2013   CLINICAL  DATA:  Fall  EXAM: CHEST - 1 VIEW  COMPARISON:  06/13/2013  FINDINGS: Chronic elevation of the left hemidiaphragm as before. Minimal basilar atelectasis versus scarring. Prior coronary bypass and left subclavian pacemaker. No definite pneumonia or edema. Negative for effusion or pneumothorax. Stable exam.  IMPRESSION: Stable chest exam without superimposed acute process.   Electronically Signed   By: Ruel Favors M.D.   On: 12/07/2013 07:27   Dg Hip Complete Right  12/07/2013   CLINICAL DATA:  Fall, right hip pain  EXAM: RIGHT HIP - COMPLETE 2+ VIEW  COMPARISON:  None.  FINDINGS: There is an acute impacted right hip subcapital femoral neck fracture. No associated subluxation or dislocation. Bony pelvis and left hip intact. Previous L4 vertebral augmentation noted. Nonobstructive bowel gas pattern.  IMPRESSION: Acute right hip subcapital femoral neck fracture   Electronically Signed   By: Ruel Favors M.D.   On: 12/07/2013 07:24   Ct Head Wo Contrast  12/07/2013   CLINICAL DATA:  Fall.  EXAM: CT HEAD WITHOUT CONTRAST  TECHNIQUE: Contiguous axial images were obtained from the base of the skull through the vertex without intravenous contrast.  COMPARISON:  CT 06/13/2013  FINDINGS: Moderate atrophy, unchanged. Mild chronic microvascular ischemic change in the white matter. No acute infarct, hemorrhage, or mass. Negative for subdural hematoma. Negative for skull fracture.  IMPRESSION: Generalized atrophy. Mild chronic microvascular ischemia. No acute abnormality.   Electronically Signed   By: Marlan Palau M.D.   On: 12/07/2013 08:04    EKG: Independently reviewed. Paced rhythm with atrial fibrillation, left bundle branch block, no significant changes from prior.  Assessment/Plan Principal Problem:   Closed right hip fracture requiring operative repair The patient was ambulatory prior to admission, but does have risk factors for adverse cardiac outcome given his history of coronary artery disease and  ongoing atrial fibrillation. At this point, would proceed with surgery despite known risks. Continue perioperative beta blockade as blood pressure tolerates. Orthopedic surgery has been consulted. Will need his INR reversed prior to surgical repair so I have given him 10 mg  of IV vitamin K (has a history of difficulty reversing his INR.) Active Problems:   CAD, ARTERY BYPASS GRAFT Hold aspirin in anticipation of surgery but resume this postoperatively. Continue beta blocker and statin.   Atrial fibrillation Paced rhythm. Continue beta blocker. Hold Coumadin and reverse INR in anticipation of surgery.   Essential hypertension, benign Monitor blood pressure and adjust therapy if needed. Continue beta blocker.   BPH (benign prostatic hyperplasia) Continue Hytrin and Proscar.  Code Status: Full. Family Communication: Ollen Gross (wife): [phone]. Onalee Hua [phone] (son) Disposition Plan: Likely will need rehabilitation postoperatively at a SNF.  Time spent: 1 hour.  Kabrina Christiano [facility] Pager [phone]  If 7PM-7AM, please contact night-coverage [email or web address] Password [email or web address] 12/07/2013, 8:59 AM

## 2013-12-07 NOTE — ED Notes (Signed)
Pt arrives via EMS c/o fall in bathroom at home. Pt states that his leg gave out on him. Denies LOC. Complains of right hip pain and having no mobility on rt leg. Positive pulses bilat. Hx afib, MIx3 and pacemaker. Pt takes warfarin. Pt states when he lays still he does not have pain, only when moving.

## 2013-12-07 NOTE — ED Notes (Signed)
Pt returned from radiology.

## 2013-12-07 NOTE — ED Provider Notes (Signed)
CSN: [id number]     Arrival date & time 12/07/13  0618 History   First MD Initiated Contact with Patient 12/07/13 [phone]     Chief Complaint  Patient presents with  . Fall   (Consider location/radiation/quality/duration/timing/severity/associated sxs/prior Treatment) HPI Comments: Patient with history of chronic atrial fibrillation, on coumadin, h/o kyphoplasty for vertebral fracture  -- presents with complaint of fall onto his right side when he was ambulating to the bathroom today. No prodrome prior to falling. Patient fell onto a hard bathroom floor. He does not remember all details of fall. He's currently complaining of right hip pain he does not think he hit his head or lost consciousness. He denies neck pain. He denies chest pain, shortness of breath, abdominal pain, dizziness. He does not have weakness, numbness, or tingling of his legs. Patient placed in KED PTA. Otherwise no treatments per EMS. The onset of this condition was acute. The course is constant. Aggravating factors: movement. Alleviating factors: none. Family states patient was on chronic prednisone for years earlier in his life which have made his bones brittle.     The history is provided by the patient.    Past Medical History  Diagnosis Date  . Atrial fibrillation     on chronic coumadin  . Hyperlipidemia   . Bronchiectasis without acute exacerbation   . Disorders of diaphragm   . Plantar fascial fibromatosis   . Epistaxis   . Unspecified sinusitis (chronic)   . Allergic rhinitis, cause unspecified   . Spinal stenosis, unspecified region other than cervical   . Hypertension   . Coronary artery disease     s/p CABG 1993, s/p PCI of SVG to OM '03  . Tachy-brady syndrome     s/p Medtronic PPM '07  . Myocardial infarction   . Pneumonia     " multiple times "  . Shortness of breath   . GERD (gastroesophageal reflux disease)   . Arthritis    Past Surgical History  Procedure Laterality Date  . Coronary artery  bypass graft      LIMA to LAD, SVG to OM, SVG to left circumflex, SVG to PD/PLSA  . Pacemaker insertion      2007, Medtronic dual-chamber  . Kidney cyst removal    . Doppler echocardiography  2011  . Cholecystectomy     Family History  Problem Relation Age of Onset  . Cancer Neg Hx   . Heart disease Neg Hx   . Stroke Neg Hx    History  Substance Use Topics  . Smoking status: Never Smoker   . Smokeless tobacco: Never Used  . Alcohol Use: No    Review of Systems  Constitutional: Negative for fever.  HENT: Negative for rhinorrhea and sore throat.   Eyes: Negative for redness.  Respiratory: Negative for cough.   Cardiovascular: Negative for chest pain.  Gastrointestinal: Negative for nausea, vomiting, abdominal pain and diarrhea.  Genitourinary: Negative for dysuria.  Musculoskeletal: Positive for arthralgias. Negative for back pain, myalgias and neck pain.  Skin: Negative for rash.  Neurological: Negative for headaches.    Allergies  Clindamycin; Doxycycline hyclate; Erythromycin; Metronidazole; Penicillins; Sulfonamide derivatives; and Tetracycline  Home Medications   Current Outpatient Rx  Name  Route  Sig  Dispense  Refill  . aspirin EC 81 MG tablet   Oral   Take 81 mg by mouth daily.         . Calcium-Magnesium-Vitamin D (CITRACAL CALCIUM+D) 600-40-500 MG-MG-UNIT TB24   Oral  Take 1 tablet by mouth 2 (two) times daily.          . CRESTOR 10 MG tablet      Take 1 tablet by mouth  daily   90 tablet   3   . fexofenadine (ALLEGRA) 180 MG tablet   Oral   Take 180 mg by mouth at bedtime as needed (for allergies).          . finasteride (PROSCAR) 5 MG tablet   Oral   Take 1 tablet (5 mg total) by mouth at bedtime.   90 tablet   2   . fluticasone (FLONASE) 50 MCG/ACT nasal spray   Nasal   Place 2 sprays into the nose daily as needed for rhinitis.         . metoprolol tartrate (LOPRESSOR) 25 MG tablet   Oral   Take 1 tablet (25 mg total) by  mouth 2 (two) times daily.   180 tablet   6   . nitroGLYCERIN (NITROSTAT) 0.4 MG SL tablet   Sublingual   Place 0.4 mg under the tongue every 5 (five) minutes x 3 doses as needed for chest pain.         [name] Kitchen omeprazole (PRILOSEC) 40 MG capsule   Oral   Take 40 mg by mouth daily.           . polyethylene glycol (MIRALAX / GLYCOLAX) packet   Oral   Take 17 g by mouth daily.         [name] Kitchen PROLIA 60 MG/ML SOLN injection   Subcutaneous   Inject 60 mg into the skin every 6 (six) months.          . terazosin (HYTRIN) 5 MG capsule   Oral   Take 1 capsule (5 mg total) by mouth daily.   90 capsule   2   . warfarin (COUMADIN) 1 MG tablet   Oral   Take 0.5-1 mg by mouth daily. 0.5 tab on Mondays and Fridays; 1 tab daily otherwise         . warfarin (COUMADIN) 1 MG tablet      Take as directed by  coumadin clinic   90 tablet   1     90 Tabs is 90 day supply    BP 160/78  Pulse 63  Temp(Src) 97.8 F (36.6 C) (Oral)  Resp 16  SpO2 98% Physical Exam  Nursing note and vitals reviewed. Constitutional: He appears well-developed and well-nourished.  HENT:  Head: Normocephalic and atraumatic.  Eyes: Conjunctivae are normal. Right eye exhibits no discharge. Left eye exhibits no discharge.  Neck: Normal range of motion. Neck supple.  Cardiovascular: Normal rate and normal heart sounds.  An irregularly irregular rhythm present.  Pulmonary/Chest: Effort normal and breath sounds normal.  Abdominal: Soft. There is no tenderness.  Musculoskeletal:       Right hip: He exhibits decreased range of motion, decreased strength, tenderness and bony tenderness.       Right knee: Normal.       Cervical back: Normal.       Thoracic back: Normal.       Lumbar back: Normal.       Legs: R LE externally rotated with mild foreshortening.  Neurological: He is alert.  Skin: Skin is warm and dry.  Psychiatric: He has a normal mood and affect.    ED Course  Procedures (including critical  care time) Labs Review Labs Reviewed  CBC WITH DIFFERENTIAL - Abnormal; Notable  for the following:    RBC 4.17 (*)    Hemoglobin 12.8 (*)    All other components within normal limits  BASIC METABOLIC PANEL - Abnormal; Notable for the following:    Glucose, Bld 103 (*)    GFR calc non Af Amer 82 (*)    All other components within normal limits  PROTIME-INR - Abnormal; Notable for the following:    Prothrombin Time 24.7 (*)    INR 2.32 (*)    All other components within normal limits   Imaging Review Dg Chest 1 View  12/07/2013   CLINICAL DATA:  Fall  EXAM: CHEST - 1 VIEW  COMPARISON:  06/13/2013  FINDINGS: Chronic elevation of the left hemidiaphragm as before. Minimal basilar atelectasis versus scarring. Prior coronary bypass and left subclavian pacemaker. No definite pneumonia or edema. Negative for effusion or pneumothorax. Stable exam.  IMPRESSION: Stable chest exam without superimposed acute process.   Electronically Signed   By: Ruel Favors M.D.   On: 12/07/2013 07:27   Dg Hip Complete Right  12/07/2013   CLINICAL DATA:  Fall, right hip pain  EXAM: RIGHT HIP - COMPLETE 2+ VIEW  COMPARISON:  None.  FINDINGS: There is an acute impacted right hip subcapital femoral neck fracture. No associated subluxation or dislocation. Bony pelvis and left hip intact. Previous L4 vertebral augmentation noted. Nonobstructive bowel gas pattern.  IMPRESSION: Acute right hip subcapital femoral neck fracture   Electronically Signed   By: Ruel Favors M.D.   On: 12/07/2013 07:24   Ct Head Wo Contrast  12/07/2013   CLINICAL DATA:  Fall.  EXAM: CT HEAD WITHOUT CONTRAST  TECHNIQUE: Contiguous axial images were obtained from the base of the skull through the vertex without intravenous contrast.  COMPARISON:  CT 06/13/2013  FINDINGS: Moderate atrophy, unchanged. Mild chronic microvascular ischemic change in the white matter. No acute infarct, hemorrhage, or mass. Negative for subdural hematoma. Negative for  skull fracture.  IMPRESSION: Generalized atrophy. Mild chronic microvascular ischemia. No acute abnormality.   Electronically Signed   By: Marlan Palau M.D.   On: 12/07/2013 08:04    EKG Interpretation    Date/Time:  Friday December 07 2013 06:47:37 EST Ventricular Rate:  64 PR Interval:  155 QRS Duration: 193 QT Interval:  487 QTC Calculation: 502 R Axis:   -37 Text Interpretation:  Age not entered, assumed to be  79 years old for purpose of ECG interpretation Ventricular-paced rhythm Left bundle branch block No significant change since last tracing Confirmed by [facility]  MD, JOHN [phone]) on 12/07/2013 7:00:41 AM           6:36 AM Patient seen and examined. Work-up initiated. Medications ordered. D/w Dr. Fonnie Jarvis.   Vital signs reviewed and are as follows: Filed Vitals:   12/07/13 0623  BP: 160/78  Pulse: 63  Temp: 97.8 F (36.6 C)  Resp: 16   8:15 AM X-ray results reviewed with patient and family. They are established with [facility]. Pain controlled at current time.   Spoke with Dr. Ophelia Charter who is on call for [place]. Asks hold anticoagulation, patient can eat. Will admit to hospitalist.   8:26 AM Spoke with Dr. Darnelle Catalan who will see.     MDM   1. Femoral neck fracture, right, closed, initial encounter    Admit for management of acute hip fracture.     Renne Crigler, PA-C 12/07/13 [phone]

## 2013-12-07 NOTE — Progress Notes (Signed)
Patient ID: Dwayne [name], male   DOB: [birth year], 79 y.o.   MRN: [id number] Patient seen full consult later today. Right femoral neck fracture displaced. He is on Coumadin with INR 2.3.   Plan admission to Hospitalist with 2 U FFP, start Lovenox, Hold Coumadin . May need 2 more units this weekend to correct INR to 1.6 or less and will post for right Hip Hemiarthroplasty Monday afternoon about 1: 45 PM.  Will be WBAT post op and may need SNF post op for a couple weeks.  I discussed plan with patient and his wife. I will see him Sat . And Sunday as I am on call for our group.

## 2013-12-07 NOTE — ED Provider Notes (Signed)
Medical screening examination/treatment/procedure(s) were conducted as a shared visit with non-physician practitioner(s) and myself.  I personally evaluated the patient during the encounter.  EKG Interpretation    Date/Time:  Friday December 07 2013 06:47:37 EST Ventricular Rate:  64 PR Interval:  155 QRS Duration: 193 QT Interval:  487 QTC Calculation: 502 R Axis:   -37 Text Interpretation:  Age not entered, assumed to be  78 years old for purpose of ECG interpretation Ventricular-paced rhythm Left bundle branch block No significant change since last tracing Confirmed by [facility]  MD, Jonny Ruiz [phone]) on 12/07/2013 7:00:41 AM           Right hip tender, worse with minimal rotation of foot  Hurman Horn, MD 12/07/13 2227

## 2013-12-08 DIAGNOSIS — S72043B Displaced fracture of base of neck of unspecified femur, initial encounter for open fracture type I or II: Secondary | ICD-10-CM | POA: Diagnosis not present

## 2013-12-08 DIAGNOSIS — I10 Essential (primary) hypertension: Secondary | ICD-10-CM | POA: Diagnosis not present

## 2013-12-08 DIAGNOSIS — N400 Enlarged prostate without lower urinary tract symptoms: Secondary | ICD-10-CM

## 2013-12-08 DIAGNOSIS — S72009A Fracture of unspecified part of neck of unspecified femur, initial encounter for closed fracture: Secondary | ICD-10-CM | POA: Diagnosis not present

## 2013-12-08 DIAGNOSIS — D72829 Elevated white blood cell count, unspecified: Secondary | ICD-10-CM | POA: Diagnosis not present

## 2013-12-08 DIAGNOSIS — N390 Urinary tract infection, site not specified: Secondary | ICD-10-CM | POA: Diagnosis present

## 2013-12-08 DIAGNOSIS — I4891 Unspecified atrial fibrillation: Secondary | ICD-10-CM | POA: Diagnosis not present

## 2013-12-08 LAB — PREPARE FRESH FROZEN PLASMA
Unit division: 0
Unit division: 0

## 2013-12-08 LAB — BASIC METABOLIC PANEL
BUN: 15 mg/dL (ref 6–23)
CO2: 29 mEq/L (ref 19–32)
Calcium: 9 mg/dL (ref 8.4–10.5)
Creatinine, Ser: 0.76 mg/dL (ref 0.50–1.35)
Glucose, Bld: 113 mg/dL — ABNORMAL HIGH (ref 70–99)

## 2013-12-08 LAB — URINALYSIS, ROUTINE W REFLEX MICROSCOPIC
Nitrite: NEGATIVE
Protein, ur: NEGATIVE mg/dL
Urobilinogen, UA: 1 mg/dL (ref 0.0–1.0)
pH: 7.5 (ref 5.0–8.0)

## 2013-12-08 LAB — CBC
MCH: 31 pg (ref 26.0–34.0)
MCHC: 33.3 g/dL (ref 30.0–36.0)
MCV: 93.1 fL (ref 78.0–100.0)
Platelets: 242 10*3/uL (ref 150–400)
RBC: 3.9 MIL/uL — ABNORMAL LOW (ref 4.22–5.81)
RDW: 14.9 % (ref 11.5–15.5)

## 2013-12-08 LAB — URINE MICROSCOPIC-ADD ON

## 2013-12-08 MED ORDER — CIPROFLOXACIN IN D5W 400 MG/200ML IV SOLN
400.0000 mg | Freq: Two times a day (BID) | INTRAVENOUS | Status: DC
  Administered 2013-12-08 – 2013-12-11 (×6): 400 mg via INTRAVENOUS
  Filled 2013-12-08 (×6): qty 200

## 2013-12-08 NOTE — Progress Notes (Signed)
[facility] PROGRESS NOTE  Dwayne [name] ZOX:[id number] DOB: [birth year] DOA: 12/07/2013 PCP: Sanda Linger, MD  Assessment/Plan: #1 closed right hip fracture Patient currently in Buck's traction. INR has been reversed. Patient has been seen by orthopedics and surgery planned for Monday. Although following and appreciate input and recommendations.  #2 atrial fibrillation Currently rate controlled on the beta blocker. Coumadin on hold and INR has been reversed. Orthopedics to advice on Coumadin can be resumed.  #3 hypertension Stable. Continue beta blocker.  #4 BPH Continue Hytrin and Proscar.  #5 coronary artery disease status post CABG Stable. Aspirin on hold. Continue beta blocker and statin.  #6 leukocytosis Questionable etiology. Chest x-ray which was done was negative. Patient currently afebrile. Check a UA with cultures and sensitivities. Follow.  #7 prophylaxis Lovenox for DVT prophylaxis.  Code Status: full Family Communication: updated patient no family at bedside. Disposition Plan: SNF versus home postoperatively when medically stable.   Consultants:  Orthopedics: Dr. Ophelia Charter 12/07/2013  Procedures:  Chest x-ray 12/07/2013  X-ray of the right hip 12/07/2013  Antibiotics:  none  HPI/Subjective: Patient complaining of Buck's traction pulling on his right lower extremity. No chest pain no shortness of breath.  Objective: Filed Vitals:   12/08/13 0612  BP: 150/75  Pulse: 66  Temp: 97.7 F (36.5 C)  Resp: 18    Intake/Output Summary (Last 24 hours) at 12/08/13 0844 Last data filed at 12/08/13 0500  Gross per 24 hour  Intake    940 ml  Output    650 ml  Net    290 ml   There were no vitals filed for this visit.  Exam:   General:  NAD  Cardiovascular: RRR  Respiratory: CTAB  Abdomen: soft, nontender, nondistended, positive bowel sounds.  Musculoskeletal: no clubbing cyanosis or edema. Right lower extremity in Buck's traction.   Data  Reviewed: Basic Metabolic Panel:  Recent Labs Lab 12/07/13 0643 12/08/13 0410  NA 139 139  K 3.5 4.4  CL 106 104  CO2 26 29  GLUCOSE 103* 113*  BUN 13 15  CREATININE 0.81 0.76  CALCIUM 9.1 9.0   Liver Function Tests:  Recent Labs Lab 12/07/13 0643  AST 28  ALT 20  ALKPHOS 68  BILITOT 0.3  PROT 7.3  ALBUMIN 3.6   No results found for this basename: LIPASE, AMYLASE,  in the last 168 hours No results found for this basename: AMMONIA,  in the last 168 hours CBC:  Recent Labs Lab 12/07/13 0643 12/08/13 0410  WBC 9.3 14.3*  NEUTROABS 6.2  --   HGB 12.8* 12.1*  HCT 39.1 36.3*  MCV 93.8 93.1  PLT 278 242   Cardiac Enzymes: No results found for this basename: CKTOTAL, CKMB, CKMBINDEX, TROPONINI,  in the last 168 hours BNP (last 3 results) No results found for this basename: PROBNP,  in the last 8760 hours CBG: No results found for this basename: GLUCAP,  in the last 168 hours  No results found for this or any previous visit (from the past 240 hour(s)).   Studies: Dg Chest 1 View  12/07/2013   CLINICAL DATA:  Fall  EXAM: CHEST - 1 VIEW  COMPARISON:  06/13/2013  FINDINGS: Chronic elevation of the left hemidiaphragm as before. Minimal basilar atelectasis versus scarring. Prior coronary bypass and left subclavian pacemaker. No definite pneumonia or edema. Negative for effusion or pneumothorax. Stable exam.  IMPRESSION: Stable chest exam without superimposed acute process.   Electronically Signed   By: Ruel Favors  M.D.   On: 12/07/2013 07:27   Dg Hip Complete Right  12/07/2013   CLINICAL DATA:  Fall, right hip pain  EXAM: RIGHT HIP - COMPLETE 2+ VIEW  COMPARISON:  None.  FINDINGS: There is an acute impacted right hip subcapital femoral neck fracture. No associated subluxation or dislocation. Bony pelvis and left hip intact. Previous L4 vertebral augmentation noted. Nonobstructive bowel gas pattern.  IMPRESSION: Acute right hip subcapital femoral neck fracture    Electronically Signed   By: Ruel Favors M.D.   On: 12/07/2013 07:24   Ct Head Wo Contrast  12/07/2013   CLINICAL DATA:  Fall.  EXAM: CT HEAD WITHOUT CONTRAST  TECHNIQUE: Contiguous axial images were obtained from the base of the skull through the vertex without intravenous contrast.  COMPARISON:  CT 06/13/2013  FINDINGS: Moderate atrophy, unchanged. Mild chronic microvascular ischemic change in the white matter. No acute infarct, hemorrhage, or mass. Negative for subdural hematoma. Negative for skull fracture.  IMPRESSION: Generalized atrophy. Mild chronic microvascular ischemia. No acute abnormality.   Electronically Signed   By: Marlan Palau M.D.   On: 12/07/2013 08:04    Scheduled Meds: . atorvastatin  20 mg Oral q1800  . calcium-vitamin D  1 tablet Oral BID  . enoxaparin (LOVENOX) injection  40 mg Subcutaneous Q24H  . finasteride  5 mg Oral QHS  . fluticasone  2 spray Each Nare Daily  . loratadine  10 mg Oral Daily  . metoprolol tartrate  25 mg Oral BID  . pantoprazole  40 mg Oral Daily  . polyethylene glycol  17 g Oral Daily  . sodium chloride  3 mL Intravenous Q12H  . terazosin  5 mg Oral Daily   Continuous Infusions: . sodium chloride 75 mL/hr at 12/07/13 1454    Principal Problem:   Closed right hip fracture Active Problems:   CAD, ARTERY BYPASS GRAFT   Atrial fibrillation   Essential hypertension, benign   BPH (benign prostatic hyperplasia)   Hip fracture requiring operative repair   Leukocytosis, unspecified    Time spent: 35 mins    [facility]  [facility] Pager [phone]. If 7PM-7AM, please contact night-coverage at [email or web address], password [facility] 12/08/2013, 8:44 AM  LOS: 1 day

## 2013-12-08 NOTE — Progress Notes (Signed)
Subjective:   Procedure(s) (LRB): Right Hip Cemented Hemiarthroplasty (Right) Patient reports pain as mild.    Objective: Vital signs in last 24 hours: Temp:  [97.4 F (36.3 C)-98 F (36.7 C)] 97.7 F (36.5 C) (12/20 0612) Pulse Rate:  [57-72] 66 (12/20 0612) Resp:  [18] 18 (12/20 0612) BP: (127-172)/(70-92) 150/75 mmHg (12/20 0612) SpO2:  [94 %-100 %] 99 % (12/20 0612)  Intake/Output from previous day: 12/19 0701 - 12/20 0700 In: 940 [P.O.:240; I.V.:700] Out: 650 [Urine:650] Intake/Output this shift:     Recent Labs  12/07/13 0643 12/08/13 0410  HGB 12.8* 12.1*    Recent Labs  12/07/13 0643 12/08/13 0410  WBC 9.3 14.3*  RBC 4.17* 3.90*  HCT 39.1 36.3*  PLT 278 242    Recent Labs  12/07/13 0643 12/08/13 0410  NA 139 139  K 3.5 4.4  CL 106 104  CO2 26 29  BUN 13 15  CREATININE 0.81 0.76  GLUCOSE 103* 113*  CALCIUM 9.1 9.0    Recent Labs  12/07/13 0643 12/08/13 0410  INR 2.32* 1.27    Neurologically intact  Assessment/Plan:   Procedure(s) (LRB): Right Hip Cemented Hemiarthroplasty (Right) Plan  .  Surgery Monday 2 PM.   INR has corrected quickly  Dwayne [name] C 12/08/2013, 11:44 AM

## 2013-12-09 DIAGNOSIS — I4891 Unspecified atrial fibrillation: Secondary | ICD-10-CM | POA: Diagnosis not present

## 2013-12-09 DIAGNOSIS — S72009A Fracture of unspecified part of neck of unspecified femur, initial encounter for closed fracture: Secondary | ICD-10-CM | POA: Diagnosis not present

## 2013-12-09 DIAGNOSIS — I10 Essential (primary) hypertension: Secondary | ICD-10-CM | POA: Diagnosis not present

## 2013-12-09 DIAGNOSIS — N390 Urinary tract infection, site not specified: Secondary | ICD-10-CM

## 2013-12-09 LAB — BASIC METABOLIC PANEL
BUN: 11 mg/dL (ref 6–23)
CO2: 22 mEq/L (ref 19–32)
Calcium: 8.9 mg/dL (ref 8.4–10.5)
Chloride: 105 mEq/L (ref 96–112)
Creatinine, Ser: 0.78 mg/dL (ref 0.50–1.35)
Glucose, Bld: 99 mg/dL (ref 70–99)

## 2013-12-09 LAB — CBC WITH DIFFERENTIAL/PLATELET
Basophils Absolute: 0.1 10*3/uL (ref 0.0–0.1)
Eosinophils Absolute: 0.4 10*3/uL (ref 0.0–0.7)
Lymphs Abs: 1.4 10*3/uL (ref 0.7–4.0)
MCH: 31 pg (ref 26.0–34.0)
MCHC: 33.2 g/dL (ref 30.0–36.0)
MCV: 93.4 fL (ref 78.0–100.0)
Monocytes Absolute: 1.3 10*3/uL — ABNORMAL HIGH (ref 0.1–1.0)
Platelets: 228 10*3/uL (ref 150–400)
RDW: 15 % (ref 11.5–15.5)
WBC: 13 10*3/uL — ABNORMAL HIGH (ref 4.0–10.5)

## 2013-12-09 LAB — SURGICAL PCR SCREEN: MRSA, PCR: NEGATIVE

## 2013-12-09 LAB — URINE CULTURE: Colony Count: >=100000

## 2013-12-09 LAB — PROTIME-INR: INR: 1.24 (ref 0.00–1.49)

## 2013-12-09 MED ORDER — CEFAZOLIN SODIUM-DEXTROSE 2-3 GM-% IV SOLR: 2.0000 g | Freq: Once | INTRAVENOUS | Status: DC

## 2013-12-09 MED ORDER — MORPHINE SULFATE 2 MG/ML IJ SOLN: 1.0000 mg | INTRAMUSCULAR | Status: DC | PRN

## 2013-12-09 MED ORDER — ZOLPIDEM TARTRATE 5 MG PO TABS
5.0000 mg | ORAL_TABLET | Freq: Every day | ORAL | Status: DC
  Administered 2013-12-09 – 2013-12-11 (×3): 5 mg via ORAL
  Filled 2013-12-09 (×4): qty 1

## 2013-12-09 MED ORDER — CEFAZOLIN SODIUM-DEXTROSE 2-3 GM-% IV SOLR
2.0000 g | Freq: Once | INTRAVENOUS | Status: AC
  Administered 2013-12-10: 2 g via INTRAVENOUS
  Filled 2013-12-09: qty 50

## 2013-12-09 NOTE — Progress Notes (Signed)
Subjective:   Procedure(s) (LRB): Right Hip Cemented Hemiarthroplasty (Right) Patient reports pain as mild.    Objective: Vital signs in last 24 hours: Temp:  [97.9 F (36.6 C)-98.6 F (37 C)] 97.9 F (36.6 C) (12/21 0549) Pulse Rate:  [58-75] 62 (12/21 0549) Resp:  [18] 18 (12/21 0549) BP: (101-155)/(71-77) 155/71 mmHg (12/21 0549) SpO2:  [97 %] 97 % (12/21 0549)  Intake/Output from previous day: 12/20 0701 - 12/21 0700 In: 1475 [P.O.:480; I.V.:995] Out: 850 [Urine:850] Intake/Output this shift:     Recent Labs  12/07/13 0643 12/08/13 0410 12/09/13 0550  HGB 12.8* 12.1* 12.2*    Recent Labs  12/08/13 0410 12/09/13 0550  WBC 14.3* 13.0*  RBC 3.90* 3.94*  HCT 36.3* 36.8*  PLT 242 228    Recent Labs  12/08/13 0410 12/09/13 0550  NA 139 139  K 4.4 3.8  CL 104 105  CO2 29 22  BUN 15 11  CREATININE 0.76 0.78  GLUCOSE 113* 99  CALCIUM 9.0 8.9    Recent Labs  12/08/13 0410 12/09/13 0550  INR 1.27 1.24    Neurologically intact  Assessment/Plan:   Procedure(s) (LRB): Right Hip Cemented Hemiarthroplasty (Right) Plan:    NPO after MN ,   Surgery tomorrow at 3 pm. Hip hemiarthoplasty  Chellsie Gomer C 12/09/2013, 12:48 PM  

## 2013-12-09 NOTE — Progress Notes (Signed)
[facility] PROGRESS NOTE  Dwayne [name] ZOX:[id number] DOB: 04-Dec-1932 DOA: 12/07/2013 PCP: Sanda Linger, MD  Assessment/Plan: #1 closed right hip fracture Patient currently in Buck's traction. INR has been reversed. Patient has been seen by orthopedics and surgery planned for Monday. Ortho following and appreciate input and recommendations.  #2 atrial fibrillation Currently rate controlled on the beta blocker. Coumadin on hold and INR has been reversed. Orthopedics to advice on when Coumadin can be resumed.  #3 hypertension Stable. Continue beta blocker.  #4 BPH Continue Hytrin and Proscar.  #5 coronary artery disease status post CABG Stable. Aspirin on hold. Continue beta blocker and statin.  #6 leukocytosis Questionable etiology. Chest x-ray which was done was negative. Patient currently afebrile. UA c/w UTI. Follow.  # 7 UTI IV Cipro started yesterday. Sensitivities pending  #8 prophylaxis Lovenox for DVT prophylaxis.  Code Status: full Family Communication: updated patient no family at bedside. Disposition Plan: SNF versus home postoperatively when medically stable.   Consultants:  Orthopedics: Dr. Ophelia Charter 12/07/2013  Procedures:  Chest x-ray 12/07/2013  X-ray of the right hip 12/07/2013  Antibiotics:  none  HPI/Subjective: Patient complaining of Buck's traction pulling on his right lower extremity. No chest pain no shortness of breath.  Objective: Filed Vitals:   12/09/13 1419  BP: 144/71  Pulse: 70  Temp: 98.2 F (36.8 C)  Resp: 18    Intake/Output Summary (Last 24 hours) at 12/09/13 1547 Last data filed at 12/09/13 0550  Gross per 24 hour  Intake   1475 ml  Output    850 ml  Net    625 ml   There were no vitals filed for this visit.  Exam:   General:  NAD  Cardiovascular: RRR  Respiratory: CTAB  Abdomen: soft, nontender, nondistended, positive bowel sounds.  Musculoskeletal: no clubbing cyanosis or edema. Right lower  extremity in Buck's traction.   Data Reviewed: Basic Metabolic Panel:  Recent Labs Lab 12/07/13 0643 12/08/13 0410 12/09/13 0550  NA 139 139 139  K 3.5 4.4 3.8  CL 106 104 105  CO2 26 29 22   GLUCOSE 103* 113* 99  BUN 13 15 11   CREATININE 0.81 0.76 0.78  CALCIUM 9.1 9.0 8.9   Liver Function Tests:  Recent Labs Lab 12/07/13 0643  AST 28  ALT 20  ALKPHOS 68  BILITOT 0.3  PROT 7.3  ALBUMIN 3.6   No results found for this basename: LIPASE, AMYLASE,  in the last 168 hours No results found for this basename: AMMONIA,  in the last 168 hours CBC:  Recent Labs Lab 12/07/13 0643 12/08/13 0410 12/09/13 0550  WBC 9.3 14.3* 13.0*  NEUTROABS 6.2  --  9.8*  HGB 12.8* 12.1* 12.2*  HCT 39.1 36.3* 36.8*  MCV 93.8 93.1 93.4  PLT 278 242 228   Cardiac Enzymes: No results found for this basename: CKTOTAL, CKMB, CKMBINDEX, TROPONINI,  in the last 168 hours BNP (last 3 results) No results found for this basename: PROBNP,  in the last 8760 hours CBG: No results found for this basename: GLUCAP,  in the last 168 hours  No results found for this or any previous visit (from the past 240 hour(s)).   Studies: No results found.  Scheduled Meds: . atorvastatin  20 mg Oral q1800  . calcium-vitamin D  1 tablet Oral BID  . ciprofloxacin  400 mg Intravenous Q12H  . finasteride  5 mg Oral QHS  . fluticasone  2 spray Each Nare Daily  . loratadine  10 mg Oral Daily  . metoprolol tartrate  25 mg Oral BID  . pantoprazole  40 mg Oral Daily  . polyethylene glycol  17 g Oral Daily  . sodium chloride  3 mL Intravenous Q12H  . terazosin  5 mg Oral Daily   Continuous Infusions: . sodium chloride 75 mL/hr at 12/08/13 0700    Principal Problem:   Closed right hip fracture Active Problems:   CAD, ARTERY BYPASS GRAFT   Atrial fibrillation   Essential hypertension, benign   BPH (benign prostatic hyperplasia)   Hip fracture requiring operative repair   Leukocytosis, unspecified   UTI  (urinary tract infection)    Time spent: 35 mins    [facility]  [facility] Pager [phone]. If 7PM-7AM, please contact night-coverage at [email or web address], password [facility] 12/09/2013, 3:47 PM  LOS: 2 days

## 2013-12-10 ENCOUNTER — Encounter (HOSPITAL_COMMUNITY): Payer: Medicare Other | Admitting: Anesthesiology

## 2013-12-10 ENCOUNTER — Inpatient Hospital Stay (HOSPITAL_COMMUNITY): Payer: Medicare Other | Admitting: Anesthesiology

## 2013-12-10 ENCOUNTER — Encounter (HOSPITAL_COMMUNITY)
Admission: EM | Disposition: A | Discharge status: Skilled Nursing Facility | Payer: Self-pay | Source: Home / Self Care | Attending: Internal Medicine

## 2013-12-10 ENCOUNTER — Inpatient Hospital Stay (HOSPITAL_COMMUNITY): Payer: Medicare Other

## 2013-12-10 DIAGNOSIS — S72033A Displaced midcervical fracture of unspecified femur, initial encounter for closed fracture: Secondary | ICD-10-CM | POA: Diagnosis not present

## 2013-12-10 DIAGNOSIS — S72043B Displaced fracture of base of neck of unspecified femur, initial encounter for open fracture type I or II: Secondary | ICD-10-CM | POA: Diagnosis not present

## 2013-12-10 DIAGNOSIS — D72829 Elevated white blood cell count, unspecified: Secondary | ICD-10-CM | POA: Diagnosis not present

## 2013-12-10 DIAGNOSIS — I4891 Unspecified atrial fibrillation: Secondary | ICD-10-CM | POA: Diagnosis not present

## 2013-12-10 DIAGNOSIS — I10 Essential (primary) hypertension: Secondary | ICD-10-CM | POA: Diagnosis not present

## 2013-12-10 DIAGNOSIS — Z96649 Presence of unspecified artificial hip joint: Secondary | ICD-10-CM | POA: Diagnosis not present

## 2013-12-10 DIAGNOSIS — Z471 Aftercare following joint replacement surgery: Secondary | ICD-10-CM | POA: Diagnosis not present

## 2013-12-10 DIAGNOSIS — K219 Gastro-esophageal reflux disease without esophagitis: Secondary | ICD-10-CM | POA: Diagnosis not present

## 2013-12-10 DIAGNOSIS — S72009A Fracture of unspecified part of neck of unspecified femur, initial encounter for closed fracture: Secondary | ICD-10-CM | POA: Diagnosis not present

## 2013-12-10 DIAGNOSIS — N390 Urinary tract infection, site not specified: Secondary | ICD-10-CM | POA: Diagnosis not present

## 2013-12-10 HISTORY — PX: HIP ARTHROPLASTY: SHX981

## 2013-12-10 LAB — CBC
HCT: 38.5 % — ABNORMAL LOW (ref 39.0–52.0)
MCH: 31.4 pg (ref 26.0–34.0)
MCHC: 33.2 g/dL (ref 30.0–36.0)
MCV: 94.4 fL (ref 78.0–100.0)
Platelets: 232 10*3/uL (ref 150–400)
RDW: 15 % (ref 11.5–15.5)

## 2013-12-10 LAB — BASIC METABOLIC PANEL
BUN: 11 mg/dL (ref 6–23)
Chloride: 104 mEq/L (ref 96–112)
Creatinine, Ser: 0.71 mg/dL (ref 0.50–1.35)
GFR calc Af Amer: >90 mL/min (ref >90)
GFR calc non Af Amer: 87 mL/min — ABNORMAL LOW (ref >90)

## 2013-12-10 SURGERY — HEMIARTHROPLASTY, HIP, DIRECT ANTERIOR APPROACH, FOR FRACTURE
Anesthesia: General | Site: Hip | Laterality: Right

## 2013-12-10 MED ORDER — WARFARIN - PHARMACIST DOSING INPATIENT: Freq: Every day | Status: DC

## 2013-12-10 MED ORDER — GLYCOPYRROLATE 0.2 MG/ML IJ SOLN
INTRAMUSCULAR | Status: DC | PRN
  Administered 2013-12-10: .5 mg via INTRAVENOUS

## 2013-12-10 MED ORDER — LIDOCAINE HCL (CARDIAC) 20 MG/ML IV SOLN
INTRAVENOUS | Status: DC | PRN
  Administered 2013-12-10: 40 mg via INTRAVENOUS

## 2013-12-10 MED ORDER — MEPERIDINE HCL 25 MG/ML IJ SOLN: 6.2500 mg | INTRAMUSCULAR | Status: DC | PRN

## 2013-12-10 MED ORDER — ONDANSETRON HCL 4 MG/2ML IJ SOLN: 4.0000 mg | Freq: Once | INTRAMUSCULAR | Status: DC | PRN

## 2013-12-10 MED ORDER — PROPOFOL 10 MG/ML IV BOLUS
INTRAVENOUS | Status: DC | PRN
  Administered 2013-12-10: 140 mg via INTRAVENOUS

## 2013-12-10 MED ORDER — LACTATED RINGERS IV SOLN
INTRAVENOUS | Status: DC | PRN
  Administered 2013-12-10: 14:00:00 via INTRAVENOUS

## 2013-12-10 MED ORDER — HYDROMORPHONE HCL PF 1 MG/ML IJ SOLN
INTRAMUSCULAR | Status: AC
  Filled 2013-12-10: qty 1

## 2013-12-10 MED ORDER — ALBUMIN HUMAN 5 % IV SOLN
INTRAVENOUS | Status: DC | PRN
  Administered 2013-12-10: 15:00:00 via INTRAVENOUS

## 2013-12-10 MED ORDER — NEOSTIGMINE METHYLSULFATE 1 MG/ML IJ SOLN
INTRAMUSCULAR | Status: DC | PRN
  Administered 2013-12-10: 4 mg via INTRAVENOUS

## 2013-12-10 MED ORDER — ONDANSETRON HCL 4 MG PO TABS: 4.0000 mg | ORAL_TABLET | Freq: Four times a day (QID) | ORAL | Status: DC | PRN

## 2013-12-10 MED ORDER — MUPIROCIN 2 % EX OINT
TOPICAL_OINTMENT | Freq: Two times a day (BID) | CUTANEOUS | Status: DC
  Administered 2013-12-10 – 2013-12-12 (×5): via NASAL
  Filled 2013-12-10: qty 22

## 2013-12-10 MED ORDER — MENTHOL 3 MG MT LOZG
1.0000 | LOZENGE | OROMUCOSAL | Status: DC | PRN
  Filled 2013-12-10: qty 9

## 2013-12-10 MED ORDER — WARFARIN SODIUM 7.5 MG PO TABS
7.5000 mg | ORAL_TABLET | Freq: Once | ORAL | Status: AC
  Administered 2013-12-10: 7.5 mg via ORAL
  Filled 2013-12-10: qty 1

## 2013-12-10 MED ORDER — ENOXAPARIN SODIUM 30 MG/0.3ML ~~LOC~~ SOLN
30.0000 mg | SUBCUTANEOUS | Status: DC
  Administered 2013-12-11 – 2013-12-12 (×2): 30 mg via SUBCUTANEOUS
  Filled 2013-12-10 (×3): qty 0.3

## 2013-12-10 MED ORDER — ONDANSETRON HCL 4 MG/2ML IJ SOLN
INTRAMUSCULAR | Status: DC | PRN
  Administered 2013-12-10: 4 mg via INTRAVENOUS

## 2013-12-10 MED ORDER — WARFARIN SODIUM 5 MG PO TABS: 5.0000 mg | ORAL_TABLET | Freq: Every day | ORAL | Status: DC

## 2013-12-10 MED ORDER — OXYCODONE HCL 5 MG PO TABS: 5.0000 mg | ORAL_TABLET | Freq: Once | ORAL | Status: DC | PRN

## 2013-12-10 MED ORDER — METOCLOPRAMIDE HCL 5 MG/ML IJ SOLN: 5.0000 mg | Freq: Three times a day (TID) | INTRAMUSCULAR | Status: DC | PRN

## 2013-12-10 MED ORDER — BUPIVACAINE HCL (PF) 0.25 % IJ SOLN
INTRAMUSCULAR | Status: AC
  Filled 2013-12-10: qty 30

## 2013-12-10 MED ORDER — OXYCODONE HCL 5 MG/5ML PO SOLN: 5.0000 mg | Freq: Once | ORAL | Status: DC | PRN

## 2013-12-10 MED ORDER — ACETAMINOPHEN 650 MG RE SUPP: 650.0000 mg | Freq: Four times a day (QID) | RECTAL | Status: DC | PRN

## 2013-12-10 MED ORDER — OXYCODONE HCL 5 MG PO TABS: 5.0000 mg | ORAL_TABLET | ORAL | Status: DC | PRN

## 2013-12-10 MED ORDER — ACETAMINOPHEN 325 MG PO TABS: 650.0000 mg | ORAL_TABLET | Freq: Four times a day (QID) | ORAL | Status: DC | PRN

## 2013-12-10 MED ORDER — FENTANYL CITRATE 0.05 MG/ML IJ SOLN
INTRAMUSCULAR | Status: DC | PRN
  Administered 2013-12-10: 100 ug via INTRAVENOUS

## 2013-12-10 MED ORDER — ONDANSETRON HCL 4 MG/2ML IJ SOLN: 4.0000 mg | Freq: Four times a day (QID) | INTRAMUSCULAR | Status: DC | PRN

## 2013-12-10 MED ORDER — ROCURONIUM BROMIDE 100 MG/10ML IV SOLN
INTRAVENOUS | Status: DC | PRN
  Administered 2013-12-10: 50 mg via INTRAVENOUS

## 2013-12-10 MED ORDER — HYDROMORPHONE HCL PF 1 MG/ML IJ SOLN
0.2500 mg | INTRAMUSCULAR | Status: DC | PRN
  Administered 2013-12-10 (×2): 0.5 mg via INTRAVENOUS

## 2013-12-10 MED ORDER — PHENYLEPHRINE HCL 10 MG/ML IJ SOLN
10.0000 mg | INTRAVENOUS | Status: DC | PRN
  Administered 2013-12-10: 60 ug/min via INTRAVENOUS

## 2013-12-10 MED ORDER — 0.9 % SODIUM CHLORIDE (POUR BTL) OPTIME
TOPICAL | Status: DC | PRN
  Administered 2013-12-10: 1000 mL

## 2013-12-10 MED ORDER — METOCLOPRAMIDE HCL 5 MG PO TABS
5.0000 mg | ORAL_TABLET | Freq: Three times a day (TID) | ORAL | Status: DC | PRN
  Filled 2013-12-10: qty 2

## 2013-12-10 MED ORDER — SODIUM CHLORIDE 0.9 % IR SOLN
Status: DC | PRN
  Administered 2013-12-10: 1000 mL

## 2013-12-10 MED ORDER — PHENOL 1.4 % MT LIQD: 1.0000 | OROMUCOSAL | Status: DC | PRN

## 2013-12-10 SURGICAL SUPPLY — 66 items
APL SKNCLS STERI-STRIP NONHPOA (GAUZE/BANDAGES/DRESSINGS) ×1
BENZOIN TINCTURE PRP APPL 2/3 (GAUZE/BANDAGES/DRESSINGS) ×2 IMPLANT
BLADE SAW SAG 73X25 THK (BLADE) ×1
BLADE SAW SGTL 73X25 THK (BLADE) ×1 IMPLANT
BLADE SURG ROTATE 9660 (MISCELLANEOUS) IMPLANT
BRUSH FEMORAL CANAL (MISCELLANEOUS) IMPLANT
CAPT HIP FX BIPOLAR/UNIPOLAR ×1 IMPLANT
CEMENT BONE DEPUY (Cement) ×2 IMPLANT
CEMENT RESTRICTOR DEPUY SZ 4 (Cement) ×1 IMPLANT
CLOTH BEACON ORANGE TIMEOUT ST (SAFETY) ×2 IMPLANT
COVER BACK TABLE 24X17X13 BIG (DRAPES) IMPLANT
DRAPE INCISE IOBAN 66X45 STRL (DRAPES) IMPLANT
DRAPE ORTHO SPLIT 77X108 STRL (DRAPES) ×4
DRAPE SURG ORHT 6 SPLT 77X108 (DRAPES) ×2 IMPLANT
DRAPE U-SHAPE 47X51 STRL (DRAPES) ×2 IMPLANT
DRSG MEPILEX BORDER 4X8 (GAUZE/BANDAGES/DRESSINGS) ×2 IMPLANT
DRSG PAD ABDOMINAL 8X10 ST (GAUZE/BANDAGES/DRESSINGS) ×4 IMPLANT
DURAPREP 26ML APPLICATOR (WOUND CARE) ×2 IMPLANT
ELECT CAUTERY BLADE 6.4 (BLADE) ×2 IMPLANT
ELECT REM PT RETURN 9FT ADLT (ELECTROSURGICAL) ×2
ELECTRODE REM PT RTRN 9FT ADLT (ELECTROSURGICAL) ×1 IMPLANT
EVACUATOR 1/8 PVC DRAIN (DRAIN) IMPLANT
FACESHIELD LNG OPTICON STERILE (SAFETY) ×4 IMPLANT
GAUZE XEROFORM 1X8 LF (GAUZE/BANDAGES/DRESSINGS) ×1 IMPLANT
GAUZE XEROFORM 5X9 LF (GAUZE/BANDAGES/DRESSINGS) ×2 IMPLANT
GLOVE BIOGEL PI IND STRL 7.5 (GLOVE) ×1 IMPLANT
GLOVE BIOGEL PI IND STRL 8 (GLOVE) ×1 IMPLANT
GLOVE BIOGEL PI INDICATOR 7.5 (GLOVE) ×1
GLOVE BIOGEL PI INDICATOR 8 (GLOVE) ×1
GLOVE ECLIPSE 7.0 STRL STRAW (GLOVE) ×2 IMPLANT
GLOVE ORTHO TXT STRL SZ7.5 (GLOVE) ×2 IMPLANT
GOWN PREVENTION PLUS LG XLONG (DISPOSABLE) IMPLANT
GOWN PREVENTION PLUS XLARGE (GOWN DISPOSABLE) ×2 IMPLANT
GOWN STRL NON-REIN LRG LVL3 (GOWN DISPOSABLE) ×2 IMPLANT
HANDPIECE INTERPULSE COAX TIP (DISPOSABLE)
IMMOBILIZER KNEE 22 UNIV (SOFTGOODS) IMPLANT
KIT BASIN OR (CUSTOM PROCEDURE TRAY) ×2 IMPLANT
KIT ROOM TURNOVER OR (KITS) ×2 IMPLANT
MANIFOLD NEPTUNE II (INSTRUMENTS) ×2 IMPLANT
NDL HYPO 25GX1X1/2 BEV (NEEDLE) ×1 IMPLANT
NDL SUT 2 .5 CRC MAYO 1.732X (NEEDLE) ×1 IMPLANT
NEEDLE HYPO 25GX1X1/2 BEV (NEEDLE) ×2 IMPLANT
NEEDLE MAYO TAPER (NEEDLE) ×2
NS IRRIG 1000ML POUR BTL (IV SOLUTION) ×2 IMPLANT
PACK TOTAL JOINT (CUSTOM PROCEDURE TRAY) ×2 IMPLANT
PAD ABD 8X10 STRL (GAUZE/BANDAGES/DRESSINGS) ×1 IMPLANT
PAD ARMBOARD 7.5X6 YLW CONV (MISCELLANEOUS) ×4 IMPLANT
PIN STEINMAN 3/16 (PIN) ×2 IMPLANT
SET HNDPC FAN SPRY TIP SCT (DISPOSABLE) IMPLANT
SPONGE GAUZE 4X4 12PLY (GAUZE/BANDAGES/DRESSINGS) ×2 IMPLANT
SPONGE LAP 4X18 X RAY DECT (DISPOSABLE) ×4 IMPLANT
STAPLER VISISTAT 35W (STAPLE) ×2 IMPLANT
STRIP CLOSURE SKIN 1/2X4 (GAUZE/BANDAGES/DRESSINGS) ×4 IMPLANT
SUCTION FRAZIER TIP 10 FR DISP (SUCTIONS) ×2 IMPLANT
SUT ETHIBOND NAB CT1 #1 30IN (SUTURE) ×6 IMPLANT
SUT TICRON (SUTURE) ×4 IMPLANT
SUT VIC AB 2-0 CT1 27 (SUTURE) ×6
SUT VIC AB 2-0 CT1 TAPERPNT 27 (SUTURE) ×3 IMPLANT
SUT VICRYL 0 TIES 12 18 (SUTURE) ×2 IMPLANT
SYR CONTROL 10ML LL (SYRINGE) ×2 IMPLANT
TAPE CLOTH SURG 6X10 WHT LF (GAUZE/BANDAGES/DRESSINGS) ×1 IMPLANT
TOWEL OR 17X24 6PK STRL BLUE (TOWEL DISPOSABLE) ×2 IMPLANT
TOWEL OR 17X26 10 PK STRL BLUE (TOWEL DISPOSABLE) ×2 IMPLANT
TOWER CARTRIDGE SMART MIX (DISPOSABLE) IMPLANT
TRAY FOLEY CATH 16FRSI W/METER (SET/KITS/TRAYS/PACK) IMPLANT
WATER STERILE IRR 1000ML POUR (IV SOLUTION) ×8 IMPLANT

## 2013-12-10 NOTE — Anesthesia Preprocedure Evaluation (Addendum)
Anesthesia Evaluation  Patient identified by MRN, date of birth, ID band Patient awake    Reviewed: Allergy & Precautions, H&P   Airway Mallampati: II TM Distance: >3 FB     Dental  (+) Teeth Intact   Pulmonary  breath sounds clear to auscultation        Cardiovascular hypertension, Rhythm:Irregular Rate:Normal  Left ventricle: Abnormal septal motion apical hypokinesis   The cavity size was mildly dilated. Wall thickness was   increased in a pattern of mild LVH. Systolic function was   mildly reduced. The estimated ejection fraction was in the   range of 45% to 50%. The study is not technically   sufficient to allow evaluation of LV diastolic function. - Aortic valve: Mild regurgitation. - Mitral valve: Calcified annulus. Mildly thickened leaflets   . Mild regurgitation. - Left atrium: The atrium was mildly dilated. - Pulmonary arteries: PA peak pressure: 33mm Hg (S).    Neuro/Psych    GI/Hepatic   Endo/Other    Renal/GU      Musculoskeletal   Abdominal   Peds  Hematology   Anesthesia Other Findings   Reproductive/Obstetrics                          Anesthesia Physical Anesthesia Plan  ASA: III  Anesthesia Plan: General   Post-op Pain Management:    Induction: Intravenous  Airway Management Planned: Oral ETT  Additional Equipment:   Intra-op Plan:   Post-operative Plan: Extubation in OR  Informed Consent: I have reviewed the patients History and Physical, chart, labs and discussed the procedure including the risks, benefits and alternatives for the proposed anesthesia with the patient or authorized representative who has indicated his/her understanding and acceptance.   Dental advisory given  Plan Discussed with: CRNA and Anesthesiologist  Anesthesia Plan Comments:         Anesthesia Quick Evaluation

## 2013-12-10 NOTE — Op Note (Signed)
NAME:  Dwayne [name], Dwayne [name] NO.:  [id number]  MEDICAL RECORD NO.:  [id number]  LOCATION:  [facility]                         FACILITY:  [facility]  PHYSICIAN:  Jonni Oelkers C. Ophelia Charter, M.D.    DATE OF BIRTH:  Apr 28, 1932  DATE OF PROCEDURE:  12/10/2013 DATE OF DISCHARGE:                              OPERATIVE REPORT   PREOPERATIVE DIAGNOSIS:  Displaced right femoral neck fracture.  POSTOPERATIVE DIAGNOSIS:  Displaced right femoral neck fracture.  PROCEDURE:  Right cemented monopolar hemiarthroplasty.  SURGEON:  Noe Goyer C. Ophelia Charter, M.D.  ANESTHESIA:  General.  ESTIMATED BLOOD LOSS:  100 mL.  DRAINS:  None.  COMPLICATIONS:  None.  ASSISTANT:  Wende Neighbors, PA-C, medically necessary and present for the entire procedure.  COMPONENTS:  DePuy cement, #5 stem, 13-mm distal cement, restrictor +0 neck, 52 mm ball, monopolar.  DESCRIPTION OF PROCEDURE:  After induction of general anesthesia, the patient was placed in lateral position.  Ancef prophylactically.  Time- out procedure, prepping with DuraPrep, usual split sheets, drapes, total hip, impervious stockinette, Coban, sterile skin marker, and Betadine, Steri-Drape x2 sealing the skin.  Time-out procedure was completed. Posterior approach was made.  Charnley retractor was placed.  Piriformis was tagged and then cut.  Posterior capsule was opened and the fracture was present.  Neck was cut 1 fingerbreadth above the lesser trochanter. Head removed with the corkscrew.  Some bone fragments removed out of the acetabulum.  There was no significant acetabular wear.  Canal started reamer, lateralizer, broaching up to #5, distal cement restrictor, #4 and trial sizer 52 head based on measurements which gave a nice section to it.  Pulse bottle brush lavage of the canal, vacuum mixing of the cement, impaction with the glue gun, pressurization, placement of the stem, holding up for 13 minutes until the cement was hard and then reduction.  Trial  reduction with broach and showed flexion to 90, adduction internal rotation to 90 degrees with good stability.  Sciatic nerve was protected and identical findings with permanent cemented stem in place.  Posterior capsule was repaired with #1 Ethibond.  The piriformis repaired back.  Gluteus medius tendon, tensor fascia closure, nonabsorbable 0 Vicryl in the gluteus maximus fascia, 2-0 Vicryl, skin staple closure, postop dressing, and transferred to recovery room.  Instrument count and needle count was correct.     Amilyah Nack C. Ophelia Charter, M.D.     MC[facility]/MEDQ  D:  12/10/2013  T:  12/10/2013  Job:  [id number]

## 2013-12-10 NOTE — Anesthesia Procedure Notes (Signed)
Procedure Name: Intubation Date/Time: 12/10/2013 2:17 PM Performed by: Coralee Rud Pre-anesthesia Checklist: Patient identified, Emergency Drugs available, Suction available and Patient being monitored Patient Re-evaluated:Patient Re-evaluated prior to inductionOxygen Delivery Method: Circle system utilized Preoxygenation: Pre-oxygenation with 100% oxygen Intubation Type: IV induction Ventilation: Mask ventilation without difficulty Laryngoscope Size: Miller and 3 Grade View: Grade II Tube type: Oral Tube size: 7.5 mm Number of attempts: 1 Airway Equipment and Method: Stylet Placement Confirmation: ETT inserted through vocal cords under direct vision,  positive ETCO2 and breath sounds checked- equal and bilateral Secured at: 23 cm Tube secured with: Tape Dental Injury: Teeth and Oropharynx as per pre-operative assessment

## 2013-12-10 NOTE — Brief Op Note (Cosign Needed)
12/07/2013 - 12/10/2013  3:40 PM  PATIENT:  Dwayne [name]  79 y.o. male  PRE-OPERATIVE DIAGNOSIS:  Right Femoral Neck Fracture  POST-OPERATIVE DIAGNOSIS:  Right Femoral Neck Fracture  PROCEDURE:  Procedure(s) with comments: Right Hip Cemented Hemiarthroplasty (Right) - Right Hip Cemented Hemiarthroplasty  SURGEON:  Surgeon(s) and Role:    * Eldred Manges, MD - Primary  PHYSICIAN ASSISTANT: Maud Deed [facility]  ASSISTANTS: none   ANESTHESIA:   general  EBL:  Total I/O In: 850 [I.V.:600; IV Piggyback:250] Out: 200 [Blood:200]  BLOOD ADMINISTERED:none  DRAINS: none   LOCAL MEDICATIONS USED:  NONE  SPECIMEN:  No Specimen  DISPOSITION OF SPECIMEN:  N/A  COUNTS:  YES  TOURNIQUET:  * No tourniquets in log *  DICTATION: .Note written in EPIC  PLAN OF CARE: Admit to inpatient   PATIENT DISPOSITION:  PACU - hemodynamically stable.   Delay start of Pharmacological VTE agent (>24hrs) due to surgical blood loss or risk of bleeding: no

## 2013-12-10 NOTE — Progress Notes (Signed)
ANTICOAGULATION CONSULT NOTE - Initial Consult  Pharmacy Consult for coumadin Indication: atrial fibrillation and VTE prophylaxis  Allergies  Allergen Reactions  . Asparaginase Derivatives Other (See Comments)    Makes jittery & causes vision problems, headaches  . Caffeine Other (See Comments)    Interrupts sleep  . Clindamycin     REACTION: upset stomach  . Doxycycline Hyclate     REACTION: n/v/d  . Erythromycin     REACTION: n/v/d  . Lactose Intolerance (Gi)   . Metronidazole     REACTION: rash, itching  . Nitrostat [Nitroglycerin] Diarrhea  . Penicillins     REACTION: hives  . Sulfonamide Derivatives     REACTION: hives  . Tetracycline     REACTION: n/v/d    Patient Measurements: Height: 5\' 9"  (175.3 cm) Weight: 161 lb (73.029 kg) IBW/kg (Calculated) : 70.7 Heparin Dosing Weight:   Vital Signs: Temp: 98.1 F (36.7 C) (12/22 1700) Temp src: Oral (12/22 1252) BP: 138/70 mmHg (12/22 1700) Pulse Rate: 69 (12/22 1700)  Labs:  Recent Labs  12/08/13 0410 12/09/13 0550 12/10/13 0525  HGB 12.1* 12.2* 12.8*  HCT 36.3* 36.8* 38.5*  PLT 242 228 232  LABPROT 15.6* 15.3* 15.6*  INR 1.27 1.24 1.27  CREATININE 0.76 0.78 0.71    Estimated Creatinine Clearance: 74.9 ml/min (by C-G formula based on Cr of 0.71).   Medical History: Past Medical History  Diagnosis Date  . Atrial fibrillation     on chronic coumadin  . Hyperlipidemia   . Bronchiectasis without acute exacerbation   . Disorders of diaphragm   . Plantar fascial fibromatosis   . Epistaxis   . Unspecified sinusitis (chronic)   . Allergic rhinitis, cause unspecified   . Spinal stenosis, unspecified region other than cervical   . Hypertension   . Coronary artery disease     s/p CABG 1993, s/p PCI of SVG to OM '03  . Tachy-brady syndrome     s/p Medtr[employer] PPM '07  . Myocardial infarction   . Pneumonia     " multiple times "  . Shortness of breath   . GERD (gastroesophageal reflux disease)   .  Arthritis     Medications:  Scheduled:  . atorvastatin  20 mg Oral q1800  . calcium-vitamin D  1 tablet Oral BID  . ciprofloxacin  400 mg Intravenous Q12H  . [START ON 12/11/2013] enoxaparin (LOVENOX) injection  30 mg Subcutaneous Q24H  . finasteride  5 mg Oral QHS  . fluticasone  2 spray Each Nare Daily  . HYDROmorphone      . loratadine  10 mg Oral Daily  . metoprolol tartrate  25 mg Oral BID  . mupirocin ointment   Nasal BID  . pantoprazole  40 mg Oral Daily  . polyethylene glycol  17 g Oral Daily  . sodium chloride  3 mL Intravenous Q12H  . terazosin  5 mg Oral Daily  . [START ON 12/11/2013] Warfarin - Pharmacist Dosing Inpatient   Does not apply q1800  . zolpidem  5 mg Oral QHS    Assessment: 79 yr old male who fell on his hip at home and had a displaces rt femoral neck fracture. Pt is on chronic coumadin for a.fib. His INR was reversed with 10 mg of IV vitamin K and 2 units of FFP. His INR is now 1.27. His home dose of coumadin is 0.5-1 mg.  Goal of Therapy:  INR 2-3    Plan:  Coumadin 7.5 mg x  1 tonight PT/INR daily.  Eugene Garnet 12/10/2013,8:46 PM

## 2013-12-10 NOTE — H&P (View-Only) (Signed)
Subjective:   Procedure(s) (LRB): Right Hip Cemented Hemiarthroplasty (Right) Patient reports pain as mild.    Objective: Vital signs in last 24 hours: Temp:  [97.9 F (36.6 C)-98.6 F (37 C)] 97.9 F (36.6 C) (12/21 0549) Pulse Rate:  [58-75] 62 (12/21 0549) Resp:  [18] 18 (12/21 0549) BP: (101-155)/(71-77) 155/71 mmHg (12/21 0549) SpO2:  [97 %] 97 % (12/21 0549)  Intake/Output from previous day: 12/20 0701 - 12/21 0700 In: 1475 [P.O.:480; I.V.:995] Out: 850 [Urine:850] Intake/Output this shift:     Recent Labs  12/07/13 0643 12/08/13 0410 12/09/13 0550  HGB 12.8* 12.1* 12.2*    Recent Labs  12/08/13 0410 12/09/13 0550  WBC 14.3* 13.0*  RBC 3.90* 3.94*  HCT 36.3* 36.8*  PLT 242 228    Recent Labs  12/08/13 0410 12/09/13 0550  NA 139 139  K 4.4 3.8  CL 104 105  CO2 29 22  BUN 15 11  CREATININE 0.76 0.78  GLUCOSE 113* 99  CALCIUM 9.0 8.9    Recent Labs  12/08/13 0410 12/09/13 0550  INR 1.27 1.24    Neurologically intact  Assessment/Plan:   Procedure(s) (LRB): Right Hip Cemented Hemiarthroplasty (Right) Plan:    NPO after MN ,   Surgery tomorrow at 3 pm. Hip hemiarthoplasty  Destanie Tibbetts C 12/09/2013, 12:48 PM

## 2013-12-10 NOTE — Transfer of Care (Signed)
Immediate Anesthesia Transfer of Care Note  Patient: Dwayne [name]  Procedure(s) Performed: Procedure(s) with comments: Right Hip Cemented Hemiarthroplasty (Right) - Right Hip Cemented Hemiarthroplasty  Patient Location: PACU  Anesthesia Type:General  Level of Consciousness: awake, alert , oriented and patient cooperative  Airway & Oxygen Therapy: Patient Spontanous Breathing, Patient connected to nasal cannula oxygen and Patient connected to face mask oxygen  Post-op Assessment: Report given to PACU RN, Post -op Vital signs reviewed and stable and Patient moving all extremities  Post vital signs: Reviewed and stable  Complications: No apparent anesthesia complications

## 2013-12-10 NOTE — Progress Notes (Signed)
[facility] PROGRESS NOTE  DEVLYN RETTER [name]:[id number] DOB: 1931-12-30 DOA: 12/07/2013 PCP: Sanda Linger, MD  Assessment/Plan: #1 closed right hip fracture Patient currently in Buck's traction. INR has been reversed. Patient has been seen by orthopedics and surgery planned for today. Ortho following and appreciate input and recommendations.  #2 atrial fibrillation Currently rate controlled on the beta blocker. Coumadin on hold and INR has been reversed. Orthopedics to advice on when Coumadin can be resumed.  #3 hypertension Stable. Continue beta blocker.  #4 BPH Continue Hytrin and Proscar.  #5 coronary artery disease status post CABG Stable. Aspirin on hold. Continue beta blocker and statin.  #6 leukocytosis Questionable etiology. Maybe reactive vs prob UTI. Chest x-ray which was done was negative. Patient currently afebrile. UA c/w UTI. Follow.  # 7 Probable UTI Urine cultures with multiple bacterial morphotypes. Patient however had dysuria. Continue IV Cipro and postop when tolerating POs will transition to orals and treat empirically.   #8 prophylaxis Lovenox for DVT prophylaxis.  Code Status: full Family Communication: updated patient and wife at bedside. Disposition Plan: SNF versus home postoperatively when medically stable.   Consultants:  Orthopedics: Dr. Ophelia Charter 12/07/2013  Procedures:  Chest x-ray 12/07/2013  X-ray of the right hip 12/07/2013  Antibiotics:  IV Cipro 12/08/13  HPI/Subjective: Patient complaining of Buck's traction pulling on his right lower extremity. No chest pain no shortness of breath.  Objective: Filed Vitals:   12/10/13 0645  BP: 167/84  Pulse: 71  Temp: 97.9 F (36.6 C)  Resp: 18    Intake/Output Summary (Last 24 hours) at 12/10/13 0853 Last data filed at 12/10/13 0646  Gross per 24 hour  Intake   1970 ml  Output   1350 ml  Net    620 ml   Filed Weights   12/09/13 2116  Weight: 73.029 kg (161 lb)     Exam:   General:  NAD  Cardiovascular: RRR  Respiratory: CTAB  Abdomen: soft, nontender, nondistended, positive bowel sounds.  Musculoskeletal: no clubbing cyanosis or edema. Right lower extremity in Buck's traction.   Data Reviewed: Basic Metabolic Panel:  Recent Labs Lab 12/07/13 0643 12/08/13 0410 12/09/13 0550 12/10/13 0525  NA 139 139 139 136  K 3.5 4.4 3.8 3.9  CL 106 104 105 104  CO2 26 29 22 21   GLUCOSE 103* 113* 99 102*  BUN 13 15 11 11   CREATININE 0.81 0.76 0.78 0.71  CALCIUM 9.1 9.0 8.9 8.9   Liver Function Tests:  Recent Labs Lab 12/07/13 0643  AST 28  ALT 20  ALKPHOS 68  BILITOT 0.3  PROT 7.3  ALBUMIN 3.6   No results found for this basename: LIPASE, AMYLASE,  in the last 168 hours No results found for this basename: AMMONIA,  in the last 168 hours CBC:  Recent Labs Lab 12/07/13 0643 12/08/13 0410 12/09/13 0550 12/10/13 0525  WBC 9.3 14.3* 13.0* 15.1*  NEUTROABS 6.2  --  9.8*  --   HGB 12.8* 12.1* 12.2* 12.8*  HCT 39.1 36.3* 36.8* 38.5*  MCV 93.8 93.1 93.4 94.4  PLT 278 242 228 232   Cardiac Enzymes: No results found for this basename: CKTOTAL, CKMB, CKMBINDEX, TROPONINI,  in the last 168 hours BNP (last 3 results) No results found for this basename: PROBNP,  in the last 8760 hours CBG: No results found for this basename: GLUCAP,  in the last 168 hours  Recent Results (from the past 240 hour(s))  URINE CULTURE     Status:  None   Collection Time    12/08/13  9:52 AM      Result Value Range Status   Specimen Description URINE, CLEAN CATCH   Final   Special Requests NONE   Final   Culture  Setup Time     Final   Value: 12/08/2013 13:48     Performed at [facility] Count     Final   Value: >=100,000 COLONIES/ML     Performed at [facility]   Culture     Final   Value: Multiple bacterial morphotypes present, none predominant. Suggest appropriate recollection if clinically indicated.      Performed at [facility]   Report Status 12/09/2013 FINAL   Final  SURGICAL PCR SCREEN     Status: Abnormal   Collection Time    12/09/13  9:09 PM      Result Value Range Status   MRSA, PCR NEGATIVE  NEGATIVE Final   Staphylococcus aureus POSITIVE (*) NEGATIVE Final   Comment:            The Xpert SA Assay (FDA     approved for NASAL specimens     in patients over 57 years of age),     is one component of     a comprehensive surveillance     program.  Test performance has     been validated by [employer] for patients greater     than or equal to 48 year old.     It is not intended     to diagnose infection nor to     guide or monitor treatment.     Studies: No results found.  Scheduled Meds: . atorvastatin  20 mg Oral q1800  . calcium-vitamin D  1 tablet Oral BID  .  ceFAZolin (ANCEF) IV  2 g Intravenous Once  . ciprofloxacin  400 mg Intravenous Q12H  . finasteride  5 mg Oral QHS  . fluticasone  2 spray Each Nare Daily  . loratadine  10 mg Oral Daily  . metoprolol tartrate  25 mg Oral BID  . mupirocin ointment   Nasal BID  . pantoprazole  40 mg Oral Daily  . polyethylene glycol  17 g Oral Daily  . sodium chloride  3 mL Intravenous Q12H  . terazosin  5 mg Oral Daily  . zolpidem  5 mg Oral QHS   Continuous Infusions: . sodium chloride 75 mL/hr at 12/10/13 0500    Principal Problem:   Closed right hip fracture Active Problems:   CAD, ARTERY BYPASS GRAFT   Atrial fibrillation   Essential hypertension, benign   BPH (benign prostatic hyperplasia)   Hip fracture requiring operative repair   Leukocytosis, unspecified   UTI (urinary tract infection)    Time spent: 35 mins    [facility]  [facility] Pager [phone]. If 7PM-7AM, please contact night-coverage at [email or web address], password [facility] 12/10/2013, 8:53 AM  LOS: 3 days

## 2013-12-10 NOTE — Interval H&P Note (Signed)
History and Physical Interval Note:  12/10/2013 1:50 PM  Dwayne [name]  has presented today for surgery, with the diagnosis of Right Femoral Neck Fracture  The various methods of treatment have been discussed with the patient and family. After consideration of risks, benefits and other options for treatment, the patient has consented to  Procedure(s) with comments: Right Hip Cemented Hemiarthroplasty (Right) - Right Hip Cemented Hemiarthroplasty as a surgical intervention .  The patient's history has been reviewed, patient examined, no change in status, stable for surgery.  I have reviewed the patient's chart and labs.  Questions were answered to the patient's satisfaction.     YATES,MARK C

## 2013-12-11 ENCOUNTER — Encounter (HOSPITAL_COMMUNITY): Payer: Self-pay | Admitting: Orthopaedic Surgery

## 2013-12-11 DIAGNOSIS — D72829 Elevated white blood cell count, unspecified: Secondary | ICD-10-CM | POA: Diagnosis not present

## 2013-12-11 DIAGNOSIS — N390 Urinary tract infection, site not specified: Secondary | ICD-10-CM | POA: Diagnosis not present

## 2013-12-11 DIAGNOSIS — I4891 Unspecified atrial fibrillation: Secondary | ICD-10-CM | POA: Diagnosis not present

## 2013-12-11 DIAGNOSIS — S72009A Fracture of unspecified part of neck of unspecified femur, initial encounter for closed fracture: Secondary | ICD-10-CM | POA: Diagnosis not present

## 2013-12-11 LAB — BASIC METABOLIC PANEL
BUN: 14 mg/dL (ref 6–23)
Calcium: 8.7 mg/dL (ref 8.4–10.5)
Creatinine, Ser: 0.84 mg/dL (ref 0.50–1.35)
GFR calc Af Amer: >90 mL/min (ref >90)
GFR calc non Af Amer: 81 mL/min — ABNORMAL LOW (ref >90)
Glucose, Bld: 110 mg/dL — ABNORMAL HIGH (ref 70–99)
Potassium: 4 mEq/L (ref 3.5–5.1)
Sodium: 139 mEq/L (ref 135–145)

## 2013-12-11 LAB — CBC
HCT: 35.4 % — ABNORMAL LOW (ref 39.0–52.0)
Hemoglobin: 11.7 g/dL — ABNORMAL LOW (ref 13.0–17.0)
MCH: 31 pg (ref 26.0–34.0)
MCHC: 33.1 g/dL (ref 30.0–36.0)
MCV: 93.7 fL (ref 78.0–100.0)
RBC: 3.78 MIL/uL — ABNORMAL LOW (ref 4.22–5.81)
WBC: 14.3 10*3/uL — ABNORMAL HIGH (ref 4.0–10.5)

## 2013-12-11 LAB — PROTIME-INR: Prothrombin Time: 17.5 seconds — ABNORMAL HIGH (ref 11.6–15.2)

## 2013-12-11 MED ORDER — CIPROFLOXACIN HCL 500 MG PO TABS
500.0000 mg | ORAL_TABLET | Freq: Two times a day (BID) | ORAL | Status: DC
  Administered 2013-12-11 – 2013-12-12 (×2): 500 mg via ORAL
  Filled 2013-12-11 (×4): qty 1

## 2013-12-11 MED ORDER — WARFARIN SODIUM 1 MG PO TABS
1.0000 mg | ORAL_TABLET | Freq: Once | ORAL | Status: AC
  Administered 2013-12-11: 1 mg via ORAL
  Filled 2013-12-11: qty 1

## 2013-12-11 NOTE — Progress Notes (Signed)
Occupational Therapy Discharge Patient Details Name: BAYLEE MCCORKEL MRN: [id number] DOB: 07/20/1932 Today's Date: 12/11/2013 Time:  -     Patient discharged from OT services secondary to Defer to SNF per family request.  Please see latest therapy progress note for current level of functioning and progress toward goals.    Progress and discharge plan discussed with patient and/or caregiver: Patient/Caregiver agrees with plan  All further therapy to be address at [facility]     Harolyn Rutherford Pager: [phone]  12/11/2013, 11:51 AM

## 2013-12-11 NOTE — Anesthesia Postprocedure Evaluation (Signed)
  Anesthesia Post-op Note  Patient: Dwayne [name]  Procedure(s) Performed: Procedure(s) with comments: Right Hip Cemented Hemiarthroplasty (Right) - Right Hip Cemented Hemiarthroplasty  Patient Location: PACU  Anesthesia Type:General  Level of Consciousness: awake, alert  and oriented  Airway and Oxygen Therapy: Patient Spontanous Breathing and Patient connected to nasal cannula oxygen  Post-op Pain: none  Post-op Assessment: Post-op Vital signs reviewed, Patient's Cardiovascular Status Stable, Respiratory Function Stable and Patent Airway  Post-op Vital Signs: Reviewed and stable  Complications: No apparent anesthesia complications

## 2013-12-11 NOTE — Progress Notes (Signed)
[facility] PROGRESS NOTE  Dwayne [name] WUJ:[id number] DOB: [birth year] DOA: 12/07/2013 PCP: Sanda Linger, MD  Assessment/Plan: #1 closed right hip fracture Patient currently in Buck's traction. INR has been reversed. Patient has been seen by orthopedics and surgery planned for today. Ortho following and appreciate input and recommendations.  #2 atrial fibrillation Currently rate controlled on the beta blocker. Coumadin on hold and INR has been reversed. Orthopedics to advice on when Coumadin can be resumed.  #3 hypertension Stable. Continue beta blocker.  #4 BPH Continue Hytrin and Proscar.  #5 coronary artery disease status post CABG Stable. Aspirin on hold. Continue beta blocker and statin.  #6 leukocytosis Questionable etiology. Maybe reactive vs prob UTI. Chest x-ray which was done was negative. Patient currently afebrile. UA c/w UTI. On ciprofloxacin. Follow.  # 7 Probable UTI Urine cultures with multiple bacterial morphotypes. Patient however had dysuria. Change IV ciprofloxacin to oral ciprofloxacin to complete a five-day course of therapy.  #8 prophylaxis Lovenox for DVT prophylaxis.  Code Status: full Family Communication: updated patient and wife at bedside. Disposition Plan: SNF when medically stable.   Consultants:  Orthopedics: Dr. Ophelia Charter 12/07/2013  Procedures:  Chest x-ray 12/07/2013  X-ray of the right hip 12/07/2013  Right cemented monopolar hemiarthroplasty per Dr. Ophelia Charter 12/10/2013  2 units FFP  Antibiotics:  IV Cipro 12/08/13--->12/23/ 2014  Oral Cipro 12/11/2013  HPI/Subjective: Patient with no complaints.  Objective: Filed Vitals:   12/11/13 0718  BP: 145/72  Pulse: 75  Temp: 98.2 F (36.8 C)  Resp: 18    Intake/Output Summary (Last 24 hours) at 12/11/13 0804 Last data filed at 12/11/13 9562  Gross per 24 hour  Intake   2020 ml  Output    775 ml  Net   1245 ml   Filed Weights   12/09/13 2116  Weight: 73.029 kg (161  lb)    Exam:   General:  NAD  Cardiovascular: RRR  Respiratory: CTAB  Abdomen: soft, nontender, nondistended, positive bowel sounds.  Musculoskeletal: no clubbing cyanosis or edema. Right lower extremity in immobilizer  Data Reviewed: Basic Metabolic Panel:  Recent Labs Lab 12/07/13 0643 12/08/13 0410 12/09/13 0550 12/10/13 0525 12/11/13 0605  NA 139 139 139 136 139  K 3.5 4.4 3.8 3.9 4.0  CL 106 104 105 104 104  CO2 26 29 22 21 24   GLUCOSE 103* 113* 99 102* 110*  BUN 13 15 11 11 14   CREATININE 0.81 0.76 0.78 0.71 0.84  CALCIUM 9.1 9.0 8.9 8.9 8.7   Liver Function Tests:  Recent Labs Lab 12/07/13 0643  AST 28  ALT 20  ALKPHOS 68  BILITOT 0.3  PROT 7.3  ALBUMIN 3.6   No results found for this basename: LIPASE, AMYLASE,  in the last 168 hours No results found for this basename: AMMONIA,  in the last 168 hours CBC:  Recent Labs Lab 12/07/13 0643 12/08/13 0410 12/09/13 0550 12/10/13 0525 12/11/13 0605  WBC 9.3 14.3* 13.0* 15.1* 14.3*  NEUTROABS 6.2  --  9.8*  --   --   HGB 12.8* 12.1* 12.2* 12.8* 11.7*  HCT 39.1 36.3* 36.8* 38.5* 35.4*  MCV 93.8 93.1 93.4 94.4 93.7  PLT 278 242 228 232 266   Cardiac Enzymes: No results found for this basename: CKTOTAL, CKMB, CKMBINDEX, TROPONINI,  in the last 168 hours BNP (last 3 results) No results found for this basename: PROBNP,  in the last 8760 hours CBG: No results found for this basename: GLUCAP,  in the last  168 hours  Recent Results (from the past 240 hour(s))  URINE CULTURE     Status: None   Collection Time    12/08/13  9:52 AM      Result Value Range Status   Specimen Description URINE, CLEAN CATCH   Final   Special Requests NONE   Final   Culture  Setup Time     Final   Value: 12/08/2013 13:48     Performed at [facility] Count     Final   Value: >=100,000 COLONIES/ML     Performed at [facility]   Culture     Final   Value: Multiple bacterial morphotypes  present, none predominant. Suggest appropriate recollection if clinically indicated.     Performed at [facility]   Report Status 12/09/2013 FINAL   Final  SURGICAL PCR SCREEN     Status: Abnormal   Collection Time    12/09/13  9:09 PM      Result Value Range Status   MRSA, PCR NEGATIVE  NEGATIVE Final   Staphylococcus aureus POSITIVE (*) NEGATIVE Final   Comment:            The Xpert SA Assay (FDA     approved for NASAL specimens     in patients over 43 years of age),     is one component of     a comprehensive surveillance     program.  Test performance has     been validated by [employer] for patients greater     than or equal to 36 year old.     It is not intended     to diagnose infection nor to     guide or monitor treatment.     Studies: Dg Pelvis Portable  12/10/2013   CLINICAL DATA:  The patient has undergone arthroplasty of the right hip.  EXAM: PORTABLE PELVIS 1-2 VIEWS  COMPARISON:  None.  FINDINGS: The patient has undergone right total hip joint prosthesis placement. Radiographic positioning of the prosthetic device is appear normal. The femoral component of the prosthesis appears to be appropriately positioned. There are surgical skin staples present.  IMPRESSION: The patient has undergone right hip joint prosthesis placement. Radiographic positioning of the prosthetic components is good.   Electronically Signed   By: David  [place]   On: 12/10/2013 16:32    Scheduled Meds: . atorvastatin  20 mg Oral q1800  . calcium-vitamin D  1 tablet Oral BID  . ciprofloxacin  400 mg Intravenous Q12H  . enoxaparin (LOVENOX) injection  30 mg Subcutaneous Q24H  . finasteride  5 mg Oral QHS  . fluticasone  2 spray Each Nare Daily  . loratadine  10 mg Oral Daily  . metoprolol tartrate  25 mg Oral BID  . mupirocin ointment   Nasal BID  . pantoprazole  40 mg Oral Daily  . polyethylene glycol  17 g Oral Daily  . sodium chloride  3 mL Intravenous Q12H  . terazosin  5  mg Oral Daily  . Warfarin - Pharmacist Dosing Inpatient   Does not apply q1800  . zolpidem  5 mg Oral QHS   Continuous Infusions: . sodium chloride 75 mL/hr at 12/10/13 1833    Principal Problem:   Closed right hip fracture Active Problems:   CAD, ARTERY BYPASS GRAFT   Atrial fibrillation   Essential hypertension, benign   BPH (benign prostatic hyperplasia)   Hip fracture  requiring operative repair   Leukocytosis, unspecified   UTI (urinary tract infection)    Time spent: 35 mins    [facility]  [facility] Pager [phone]. If 7PM-7AM, please contact night-coverage at [email or web address], password [facility] 12/11/2013, 8:04 AM  LOS: 4 days

## 2013-12-11 NOTE — Consult Note (Signed)
WOC wound consult note Reason for Consult: requested to evaluate early skin changes on the right pretibial area from knee immobilizer Wound type: sheer and early pressure injury Measurement: scattered linear area over the pretibial region of the RLE, with 3 irregularly shaped areas aprox 1.5cm x 1.0cm x 0 Wound bed: purple and maroon skin over the pretibial area that does not blanch and is not currently open Drainage (amount, consistency, odor) none Periwound:intact Dressing procedure/placement/frequency: foam for protection from sheer and pressure of this patients knee immobilizer. Discussed POC with patient and bedside nurse.  Re consult if needed, will not follow at this time. Thanks  Braidan Ricciardi [employer], CWOCN [phone])

## 2013-12-11 NOTE — Progress Notes (Addendum)
Subjective: 1 Day Post-Op Procedure(s) (LRB): Right Hip Cemented Hemiarthroplasty (Right) Patient reports pain as mild.    Objective: Vital signs in last 24 hours: Temp:  [98.1 F (36.7 C)-98.7 F (37.1 C)] 98.2 F (36.8 C) (12/23 0718) Pulse Rate:  [54-75] 75 (12/23 0718) Resp:  [14-21] 18 (12/23 0718) BP: (138-156)/(67-87) 145/72 mmHg (12/23 0718) SpO2:  [94 %-100 %] 99 % (12/23 0718)  Intake/Output from previous day: 12/22 0701 - 12/23 0700 In: 2020 [P.O.:120; I.V.:1650; IV Piggyback:250] Out: 775 [Urine:575; Blood:200] Intake/Output this shift:     Recent Labs  12/09/13 0550 12/10/13 0525 12/11/13 0605  HGB 12.2* 12.8* 11.7*    Recent Labs  12/10/13 0525 12/11/13 0605  WBC 15.1* 14.3*  RBC 4.08* 3.78*  HCT 38.5* 35.4*  PLT 232 266    Recent Labs  12/09/13 0550 12/10/13 0525  NA 139 136  K 3.8 3.9  CL 105 104  CO2 22 21  BUN 11 11  CREATININE 0.78 0.71  GLUCOSE 99 102*  CALCIUM 8.9 8.9    Recent Labs  12/09/13 0550 12/10/13 0525  INR 1.24 1.27    Neurologically intact  Assessment/Plan: 1 Day Post-Op Procedure(s) (LRB): Right Hip Cemented Hemiarthroplasty (Right) Up with therapy  D/c knee immobilizer Plan per family request is SNF Dwayne [name] C 12/11/2013, 7:24 AM

## 2013-12-11 NOTE — Care Management Note (Signed)
CARE MANAGEMENT NOTE 12/11/2013  Patient:  Dwayne [name], Dwayne [name]   Account Number:  [id number]  Date Initiated:  12/11/2013  Documentation initiated by:  [facility]  Subjective/Objective Assessment:   Admitted with rt hip fracture, had surgery postponed due to INR, had rt hip arthroplasty 12/21, anticipate discharge to SNF.     Action/Plan:   Anticipated DC Date:  12/14/2013   Anticipated DC Plan:  SKILLED NURSING FACILITY  In-house referral  Clinical Social Worker         Choice offered to / List presented to:             Status of service:  Completed, signed off Medicare Important Message given?   (If response is "NO", the following Medicare IM given date fields will be blank) Date Medicare IM given:   Date Additional Medicare IM given:    Discharge Disposition:  SKILLED NURSING FACILITY

## 2013-12-11 NOTE — Progress Notes (Signed)
Clinical Social Work Department BRIEF PSYCHOSOCIAL ASSESSMENT 12/11/2013  Patient:  NERI, SAMEK     Account Number:  [id number]     Admit date:  12/07/2013  Clinical Social Worker:  Harless Nakayama  Date/Time:  12/11/2013 04:00 PM  Referred by:  Physician  Date Referred:  12/11/2013 Referred for  SNF Placement   Other Referral:   Interview type:  Patient Other interview type:   Pt wife at bedside    PSYCHOSOCIAL DATA Living Status:  WIFE Admitted from facility:   Level of care:   Primary support name:  Ollen Gross [phone] Primary support relationship to patient:  SPOUSE Degree of support available:   Pt has supportive family    CURRENT CONCERNS Current Concerns  Post-Acute Placement   Other Concerns:    SOCIAL WORK ASSESSMENT / PLAN CSW made aware of MD note saying pt family requesting SNF. CSW spoke with pt wife who was at pt bedside. Pt wife informed CSW that pt has been to [facility] in the past and they would like pt to return there at dc. CSW has sent referral to [facility] and called to inform they are families first choice. Facility informed CSW they can accept tomorrow but their pharmacy closes at 1pm so will need dc paperwork prior to this time. CSW informed pt nurse who will inform MD.   Assessment/plan status:  Psychosocial Support/Ongoing Assessment of Needs Other assessment/ plan:   Information/referral to community resources:   None needed    PATIENT'S/FAMILY'S RESPONSE TO PLAN OF CARE: Pt and pt family agreeable to [facility]      [place], LCSWA [phone]

## 2013-12-11 NOTE — Evaluation (Signed)
Physical Therapy Evaluation Patient Details Name: Dwayne [name] MRN: [id number] DOB: 29-Apr-1932 Today's Date: 12/11/2013 Time: 4098-1191 PT Time Calculation (min): 45 min  PT Assessment / Plan / Recommendation History of Present Illness  Pt is a 79 y/o male admitted s/p fall at home. Pt is now s/p R hip hemiarthroplasty.  Clinical Impression  This patient presents with acute pain and decreased functional independence following the above mentioned procedure. At the time of PT eval, pt required increased cueing for sequencing, safety awareness, and weight bearing on the RLE. Pt reports that he can't move his RLE, and requires encouragement throughout session to perform functional mobility. This patient is appropriate for skilled PT interventions to address functional limitations, improve safety and independence with functional mobility, and return to PLOF.     PT Assessment  Patient needs continued PT services    Follow Up Recommendations  SNF    Does the patient have the potential to tolerate intense rehabilitation      Barriers to Discharge        Equipment Recommendations  Other (comment) (Equipment needs TBD by next venue of care)    Recommendations for Other Services     Frequency Min 5X/week    Precautions / Restrictions Precautions Precautions: Fall;Posterior Hip Precaution Booklet Issued: Yes (comment) Required Braces or Orthoses: Knee Immobilizer - Right Knee Immobilizer - Right: Other (comment) (D/C POD 1) Restrictions Weight Bearing Restrictions: Yes RLE Weight Bearing: Weight bearing as tolerated   Pertinent Vitals/Pain Pt reports severe pain at rest but states it is a 3/10.      Mobility  Bed Mobility Bed Mobility: Supine to Sit;Sitting - Scoot to Edge of Bed Supine to Sit: 4: Min assist Sitting - Scoot to [place] of Bed: 4: Min assist Details for Bed Mobility Assistance: Bed pad used to transition pt to EOB. Assist for support and movement of  RLE. Transfers Transfers: Sit to Stand;Stand to Sit Sit to Stand: 3: Mod assist;From bed;With upper extremity assist Stand to Sit: 4: Min assist;To chair/3-in-1;With upper extremity assist Details for Transfer Assistance: VC's for hand placement and safety awareness when performing transfers. Ambulation/Gait Ambulation/Gait Assistance: 4: Min assist Ambulation Distance (Feet): 5 Feet Assistive device: Rolling walker Ambulation/Gait Assistance Details: Pt required constant cueing for sequencing with the RW. If therapist did not cue pt, he would ask "what next?". Gait Pattern: Step-to pattern;Decreased stride length;Shuffle Gait velocity: Decreased General Gait Details: Pt avoids WB on the RLE due to pain Stairs: No Wheelchair Mobility Wheelchair Mobility: No    Exercises Total Joint Exercises Ankle Circles/Pumps: 10 reps Quad Sets: 10 reps   PT Diagnosis: Difficulty walking;Acute pain  PT Problem List: Decreased strength;Decreased range of motion;Decreased activity tolerance;Decreased balance;Decreased mobility;Decreased knowledge of use of DME;Decreased safety awareness;Pain PT Treatment Interventions: DME instruction;Stair training;Gait training;Functional mobility training;Therapeutic activities;Therapeutic exercise;Neuromuscular re-education;Patient/family education     PT Goals(Current goals can be found in the care plan section) Acute Rehab PT Goals Patient Stated Goal: To return home with wife after rehab PT Goal Formulation: With patient/family Time For Goal Achievement: 12/18/13 Potential to Achieve Goals: Good  Visit Information  Last PT Received On: 12/11/13 Assistance Needed: +2 (chair follow) History of Present Illness: Pt is a 79 y/o male admitted s/p fall at home. Pt is now s/p R hip hemiarthroplasty.       Prior Functioning  Home Living Family/patient expects to be discharged to:: Skilled nursing facility Living Arrangements: Spouse/significant other Prior  Function Level of Independence: Independent with assistive  device(s) Communication Communication: No difficulties Dominant Hand: Right    Cognition  Cognition Arousal/Alertness: Awake/alert Behavior During Therapy: WFL for tasks assessed/performed Overall Cognitive Status: Within Functional Limits for tasks assessed    Extremity/Trunk Assessment Upper Extremity Assessment Upper Extremity Assessment: Overall WFL for tasks assessed Lower Extremity Assessment Lower Extremity Assessment: RLE deficits/detail RLE Deficits / Details: Decreased strength and AROM consistent with hip hemiarthroplasty RLE: Unable to fully assess due to pain Cervical / Trunk Assessment Cervical / Trunk Assessment: Kyphotic   Balance Balance Balance Assessed: Yes Static Sitting Balance Static Sitting - Balance Support: Feet supported;Bilateral upper extremity supported Static Sitting - Level of Assistance: 5: Stand by assistance Static Standing Balance Static Standing - Balance Support: Bilateral upper extremity supported Static Standing - Level of Assistance: 4: Min assist  End of Session PT - End of Session Equipment Utilized During Treatment: Gait belt;Right knee immobilizer Activity Tolerance: Patient limited by pain;Patient limited by fatigue Patient left: in chair;with call bell/phone within reach;with family/visitor present Nurse Communication: Mobility status  GP     Ruthann Cancer 12/11/2013, 12:24 PM  Ruthann Cancer, PT, DPT [phone]

## 2013-12-11 NOTE — Progress Notes (Signed)
Clinical Social Work Department CLINICAL SOCIAL WORK PLACEMENT NOTE 12/11/2013  Patient:  Dwayne [name], Dwayne [name]  Account Number:  [id number] Admit date:  12/07/2013  Clinical Social Worker:  Sharol Harness, Theresia Majors  Date/time:  12/11/2013 12:00 M  Clinical Social Work is seeking post-discharge placement for this patient at the following level of care:   SKILLED NURSING   (*CSW will update this form in Epic as items are completed)   12/11/2013  Patient/family provided with [facility] Department of Clinical Social Work's list of facilities offering this level of care within the geographic area requested by the patient (or if unable, by the patient's family).  12/11/2013  Patient/family informed of their freedom to choose among providers that offer the needed level of care, that participate in Medicare, Medicaid or managed care program needed by the patient, have an available bed and are willing to accept the patient.  12/11/2013  Patient/family informed of [facility]' ownership interest in [facility], as well as of the fact that they are under no obligation to receive care at this facility.  PASARR submitted to EDS on  PASARR number received from EDS on   FL2 transmitted to all facilities in geographic area requested by pt/family on  12/11/2013 FL2 transmitted to all facilities within larger geographic area on 12/11/2013  Patient informed that his/her managed care company has contracts with or will negotiate with  certain facilities, including the following:     Patient/family informed of bed offers received:  12/11/2013 Patient chooses bed at [facility] Physician recommends and patient chooses bed at    Patient to be transferred to  on   Patient to be transferred to facility by   The following physician request were entered in Epic:   Additional Comments:   Damonica Chopra, LCSWA [phone]

## 2013-12-11 NOTE — Progress Notes (Signed)
ANTICOAGULATION CONSULT NOTE - Follow-up  Pharmacy Consult for coumadin Indication: atrial fibrillation and VTE prophylaxis  Allergies  Allergen Reactions  . Asparaginase Derivatives Other (See Comments)    Makes jittery & causes vision problems, headaches  . Caffeine Other (See Comments)    Interrupts sleep  . Clindamycin     REACTION: upset stomach  . Doxycycline Hyclate     REACTION: n/v/d  . Erythromycin     REACTION: n/v/d  . Lactose Intolerance (Gi)   . Metronidazole     REACTION: rash, itching  . Nitrostat [Nitroglycerin] Diarrhea  . Penicillins     REACTION: hives  . Sulfonamide Derivatives     REACTION: hives  . Tetracycline     REACTION: n/v/d    Patient Measurements: Height: 5\' 9"  (175.3 cm) Weight: 161 lb (73.029 kg) IBW/kg (Calculated) : 70.7  Vital Signs: Temp: 98.2 F (36.8 C) (12/23 0718) BP: 145/72 mmHg (12/23 0718) Pulse Rate: 75 (12/23 0718)  Labs:  Recent Labs  12/09/13 0550 12/10/13 0525 12/11/13 0605  HGB 12.2* 12.8* 11.7*  HCT 36.8* 38.5* 35.4*  PLT 228 232 266  LABPROT 15.3* 15.6* 17.5*  INR 1.24 1.27 1.48  CREATININE 0.78 0.71 0.84    Estimated Creatinine Clearance: 71.3 ml/min (by C-G formula based on Cr of 0.84).  Assessment: 79 yr old male who fell on his hip at home and had a displaces rt femoral neck fracture. Pt is on chronic coumadin for a.fib. His INR was reversed with 10 mg of IV vitamin K and 2 units of FFP on 12/18. Coumadin was resumed last night and INR today remains subtherapeutic at 1.48. CBC is stable and no bleeding noted. Received large dose of coumadin last night compared to his normal home dose. Also pt is on cipro which may increase the INR. So we may see a large jump in INR in the next day or two.   Goal of Therapy:  INR 2-3   Plan:  1. Coumadin 1mg  PO x 1 tonight 2. F/u AM INR  Lysle Pearl, PharmD, BCPS Pager # [phone] 12/11/2013 10:39 AM

## 2013-12-12 DIAGNOSIS — I2510 Atherosclerotic heart disease of native coronary artery without angina pectoris: Secondary | ICD-10-CM | POA: Diagnosis not present

## 2013-12-12 DIAGNOSIS — Z950 Presence of cardiac pacemaker: Secondary | ICD-10-CM | POA: Diagnosis not present

## 2013-12-12 DIAGNOSIS — Z87440 Personal history of urinary (tract) infections: Secondary | ICD-10-CM | POA: Diagnosis not present

## 2013-12-12 DIAGNOSIS — Z96649 Presence of unspecified artificial hip joint: Secondary | ICD-10-CM | POA: Diagnosis not present

## 2013-12-12 DIAGNOSIS — S72009D Fracture of unspecified part of neck of unspecified femur, subsequent encounter for closed fracture with routine healing: Secondary | ICD-10-CM | POA: Diagnosis not present

## 2013-12-12 DIAGNOSIS — Z9181 History of falling: Secondary | ICD-10-CM | POA: Diagnosis not present

## 2013-12-12 DIAGNOSIS — I10 Essential (primary) hypertension: Secondary | ICD-10-CM | POA: Diagnosis not present

## 2013-12-12 DIAGNOSIS — L0291 Cutaneous abscess, unspecified: Secondary | ICD-10-CM | POA: Diagnosis not present

## 2013-12-12 DIAGNOSIS — Z5189 Encounter for other specified aftercare: Secondary | ICD-10-CM | POA: Diagnosis not present

## 2013-12-12 DIAGNOSIS — E785 Hyperlipidemia, unspecified: Secondary | ICD-10-CM | POA: Diagnosis not present

## 2013-12-12 DIAGNOSIS — I25810 Atherosclerosis of coronary artery bypass graft(s) without angina pectoris: Secondary | ICD-10-CM | POA: Diagnosis not present

## 2013-12-12 DIAGNOSIS — S72009A Fracture of unspecified part of neck of unspecified femur, initial encounter for closed fracture: Secondary | ICD-10-CM | POA: Diagnosis not present

## 2013-12-12 DIAGNOSIS — R82998 Other abnormal findings in urine: Secondary | ICD-10-CM | POA: Diagnosis not present

## 2013-12-12 DIAGNOSIS — I4891 Unspecified atrial fibrillation: Secondary | ICD-10-CM | POA: Diagnosis not present

## 2013-12-12 DIAGNOSIS — K5900 Constipation, unspecified: Secondary | ICD-10-CM | POA: Diagnosis not present

## 2013-12-12 DIAGNOSIS — R262 Difficulty in walking, not elsewhere classified: Secondary | ICD-10-CM | POA: Diagnosis not present

## 2013-12-12 DIAGNOSIS — M25559 Pain in unspecified hip: Secondary | ICD-10-CM | POA: Diagnosis not present

## 2013-12-12 DIAGNOSIS — R8271 Bacteriuria: Secondary | ICD-10-CM | POA: Diagnosis present

## 2013-12-12 DIAGNOSIS — Z951 Presence of aortocoronary bypass graft: Secondary | ICD-10-CM | POA: Diagnosis not present

## 2013-12-12 DIAGNOSIS — Z471 Aftercare following joint replacement surgery: Secondary | ICD-10-CM | POA: Diagnosis not present

## 2013-12-12 DIAGNOSIS — S72043B Displaced fracture of base of neck of unspecified femur, initial encounter for open fracture type I or II: Secondary | ICD-10-CM | POA: Diagnosis not present

## 2013-12-12 DIAGNOSIS — N400 Enlarged prostate without lower urinary tract symptoms: Secondary | ICD-10-CM | POA: Diagnosis not present

## 2013-12-12 DIAGNOSIS — M6281 Muscle weakness (generalized): Secondary | ICD-10-CM | POA: Diagnosis not present

## 2013-12-12 DIAGNOSIS — D72829 Elevated white blood cell count, unspecified: Secondary | ICD-10-CM | POA: Diagnosis not present

## 2013-12-12 DIAGNOSIS — R279 Unspecified lack of coordination: Secondary | ICD-10-CM | POA: Diagnosis not present

## 2013-12-12 DIAGNOSIS — Z7901 Long term (current) use of anticoagulants: Secondary | ICD-10-CM | POA: Diagnosis not present

## 2013-12-12 LAB — CBC
HCT: 33.3 % — ABNORMAL LOW (ref 39.0–52.0)
Hemoglobin: 10.8 g/dL — ABNORMAL LOW (ref 13.0–17.0)
RBC: 3.54 MIL/uL — ABNORMAL LOW (ref 4.22–5.81)
RDW: 15.1 % (ref 11.5–15.5)
WBC: 13.1 10*3/uL — ABNORMAL HIGH (ref 4.0–10.5)

## 2013-12-12 LAB — PROTIME-INR
INR: 1.9 — ABNORMAL HIGH (ref 0.00–1.49)
Prothrombin Time: 21.2 seconds — ABNORMAL HIGH (ref 11.6–15.2)

## 2013-12-12 MED ORDER — WARFARIN 0.5 MG HALF TABLET
0.5000 mg | ORAL_TABLET | Freq: Once | ORAL | Status: DC
  Filled 2013-12-12: qty 1

## 2013-12-12 NOTE — Progress Notes (Signed)
CSW (Clinical Child psychotherapist) prepared pt dc packet and placed with shadow chart. CSW arranged non-emergent ambulance transport at 12:15pm. Pt, pt family, pt nurse, and facility informed. CSW signing off.  Suki Crockett, LCSWA [phone]

## 2013-12-12 NOTE — Progress Notes (Signed)
Subjective: 2 Days Post-Op Procedure(s) (LRB): Right Hip Cemented Hemiarthroplasty (Right) Patient reports pain as mild.    Objective: Vital signs in last 24 hours: Temp:  [98.6 F (37 C)-99.3 F (37.4 C)] 98.6 F (37 C) (12/24 0626) Pulse Rate:  [62-75] 75 (12/24 0626) Resp:  [16-18] 18 (12/24 0626) BP: (102-108)/(52-76) 102/76 mmHg (12/24 0626) SpO2:  [93 %-99 %] 99 % (12/24 0626)  Intake/Output from previous day: 12/23 0701 - 12/24 0700 In: 1320 [P.O.:720; I.V.:600] Out: 1500 [Urine:1500] Intake/Output this shift:     Recent Labs  12/10/13 0525 12/11/13 0605 12/12/13 0353  HGB 12.8* 11.7* 10.8*    Recent Labs  12/11/13 0605 12/12/13 0353  WBC 14.3* 13.1*  RBC 3.78* 3.54*  HCT 35.4* 33.3*  PLT 266 245    Recent Labs  12/10/13 0525 12/11/13 0605  NA 136 139  K 3.9 4.0  CL 104 104  CO2 21 24  BUN 11 14  CREATININE 0.71 0.84  GLUCOSE 102* 110*  CALCIUM 8.9 8.7    Recent Labs  12/11/13 0605 12/12/13 0353  INR 1.48 1.90*    Neurologically intact  Assessment/Plan: 2 Days Post-Op Procedure(s) (LRB): Right Hip Cemented Hemiarthroplasty (Right) Up with therapy Discharge to SNF  FL@ signed  Millard Bautch C 12/12/2013, 7:33 AM

## 2013-12-12 NOTE — Progress Notes (Signed)
ANTICOAGULATION CONSULT NOTE - Follow-up  Pharmacy Consult for coumadin Indication: atrial fibrillation and VTE prophylaxis  Allergies  Allergen Reactions  . Asparaginase Derivatives Other (See Comments)    Makes jittery & causes vision problems, headaches  . Caffeine Other (See Comments)    Interrupts sleep  . Clindamycin     REACTION: upset stomach  . Doxycycline Hyclate     REACTION: n/v/d  . Erythromycin     REACTION: n/v/d  . Lactose Intolerance (Gi)   . Metronidazole     REACTION: rash, itching  . Nitrostat [Nitroglycerin] Diarrhea  . Penicillins     REACTION: hives  . Sulfonamide Derivatives     REACTION: hives  . Tetracycline     REACTION: n/v/d    Patient Measurements: Height: 5\' 9"  (175.3 cm) Weight: 161 lb (73.029 kg) IBW/kg (Calculated) : 70.7  Vital Signs: Temp: 98.6 F (37 C) (12/24 0626) BP: 102/76 mmHg (12/24 0626) Pulse Rate: 75 (12/24 0626)  Labs:  Recent Labs  12/10/13 0525 12/11/13 0605 12/12/13 0353  HGB 12.8* 11.7* 10.8*  HCT 38.5* 35.4* 33.3*  PLT 232 266 245  LABPROT 15.6* 17.5* 21.2*  INR 1.27 1.48 1.90*  CREATININE 0.71 0.84  --     Estimated Creatinine Clearance: 71.3 ml/min (by C-G formula based on Cr of 0.84).  Assessment: 79 yr old male who fell on his hip at home and had a displaces rt femoral neck fracture. Pt is on chronic coumadin for a.fib. His INR was reversed with 10 mg of IV vitamin K and 2 units of FFP on 12/18. Coumadin was resumed 12/22 and INR today remains subtherapeutic at 1.9 but did increase significantly from yesterday. H/H down slightly but no bleeding noted. Received large dose of coumadin on 12/22 compared to his normal home dose. Also pt is on cipro which may increase the INR. So I am anticipate INR to continue to increase.  Goal of Therapy:  INR 2-3   Plan:  1. Coumadin 0.5mg  PO x 1 tonight 2. F/u AM INR  Lysle Pearl, PharmD, BCPS Pager # [phone] 12/12/2013 8:09 AM

## 2013-12-12 NOTE — Progress Notes (Signed)
Physical Therapy Treatment Patient Details Name: Dwayne [name] MRN: [id number] DOB: 08/17/1932 Today's Date: 12/12/2013 Time: 4098-1191 PT Time Calculation (min): 24 min  PT Assessment / Plan / Recommendation  History of Present Illness Pt is a 79 y/o male admitted s/p fall at home. Pt is now s/p R hip hemiarthroplasty.   PT Comments   Pt progressing slowly towards physical therapy goals. Appeared to have decreased processing during gait training, requiring step-by-step cueing for sequencing. Pt able to perform sit<>stand x4 during session, requiring mod assist to prevent fall from LOB. Pt reports he is to d/c to SNF this afternoon.   Follow Up Recommendations  SNF     Does the patient have the potential to tolerate intense rehabilitation     Barriers to Discharge        Equipment Recommendations  Other (comment)    Recommendations for Other Services    Frequency Min 5X/week   Progress towards PT Goals Progress towards PT goals: Progressing toward goals  Plan Current plan remains appropriate    Precautions / Restrictions Precautions Precautions: Fall;Posterior Hip Precaution Comments: Pt able to recall 0/3 posterior hip precautions, and 3/3 hip precautions were discussed Restrictions Weight Bearing Restrictions: No RLE Weight Bearing: Weight bearing as tolerated   Pertinent Vitals/Pain 8/10 during ambulation.     Mobility  Bed Mobility Bed Mobility: Not assessed Details for Bed Mobility Assistance: Pt received sitting up in chair. Transfers Transfers: Sit to Stand;Stand to Sit Sit to Stand: 3: Mod assist;From chair/3-in-1;With upper extremity assist Stand to Sit: 4: Min assist;To chair/3-in-1;With upper extremity assist Details for Transfer Assistance: VC's for hand placement and to maintain hip precautions during transfers. Ambulation/Gait Ambulation/Gait Assistance: 4: Min assist Ambulation Distance (Feet): 12 Feet Assistive device: Rolling walker Ambulation/Gait  Assistance Details: Pt continues to require constant cueing for sequencing with the RW. If therapist slowed cueing pt would stop and ask what to do.  Gait Pattern: Step-to pattern;Decreased stride length;Shuffle Gait velocity: Decreased General Gait Details: Poor posture with little corrective changes with cueing.  Stairs: No Wheelchair Mobility Wheelchair Mobility: No    Exercises Total Joint Exercises Ankle Circles/Pumps: 10 reps Hip ABduction/ADduction: 10 reps Long Arc Quad: 10 reps   PT Diagnosis:    PT Problem List:   PT Treatment Interventions:     PT Goals (current goals can now be found in the care plan section) Acute Rehab PT Goals Patient Stated Goal: To return home with wife after rehab PT Goal Formulation: With patient/family Time For Goal Achievement: 12/18/13 Potential to Achieve Goals: Good  Visit Information  Last PT Received On: 12/12/13 Assistance Needed: +2 History of Present Illness: Pt is a 79 y/o male admitted s/p fall at home. Pt is now s/p R hip hemiarthroplasty.    Subjective Data  Subjective: "I don't know what to do, I need you to tell me." Patient Stated Goal: To return home with wife after rehab   Cognition  Cognition Arousal/Alertness: Awake/alert Behavior During Therapy: WFL for tasks assessed/performed Overall Cognitive Status: Within Functional Limits for tasks assessed    Balance  Balance Balance Assessed: Yes Static Standing Balance Static Standing - Balance Support: Bilateral upper extremity supported;During functional activity Static Standing - Level of Assistance: 4: Min assist;3: Mod assist  End of Session PT - End of Session Equipment Utilized During Treatment: Gait belt Activity Tolerance: Patient limited by pain;Patient limited by fatigue Patient left: in chair;with call bell/phone within reach;with family/visitor present Nurse Communication: Mobility status  GP     Ruthann Cancer 12/12/2013, 2:39 PM  Ruthann Cancer, PT, DPT [phone]

## 2013-12-12 NOTE — Discharge Summary (Signed)
Physician Discharge Summary  DEMETRIO LEIGHTY WUJ:[id number] DOB: 1932/08/26 DOA: 12/07/2013  PCP: Sanda Linger, MD  Admit date: 12/07/2013 Discharge date: 12/12/2013  Time spent: 70 minutes  Recommendations for Outpatient Follow-up:  1. Followup with Dr. Ophelia Charter of orthopedics 2 weeks post discharge. 2. Followup with M.D. at the skilled nursing facility. Patient will need a PT/INR checked on Friday, 12/14/2013, and Coumadin doses adjusted accordingly.  Discharge Diagnoses:  Principal Problem:   Closed right hip fracture Active Problems:   CAD, ARTERY BYPASS GRAFT   Atrial fibrillation   Essential hypertension, benign   BPH (benign prostatic hyperplasia)   Hip fracture requiring operative repair   Leukocytosis, unspecified   UTI (urinary tract infection)   Bacteria in urine   Discharge Condition: Stable and improved  Diet recommendation: Heart healthy  Filed Weights   12/09/13 2116  Weight: 73.029 kg (161 lb)    History of present illness:  JEREMIAS BROYHILL is an 79 y.o. male with a PMH of CAD status post CABG, atrial fibrillation on chronic coumadin and s/p pacemaker for tachycardia-bradycardia syndrome who suffered a mechanical fall resulting in a right hip fracture. The fall was un witnessed, the patient called out to his wife who found him on the floor in the bathroom. No obvious syncope, but the patient is a poor historian and cannot say for certain. He did not have any complaints of chest pain or dizziness prior to the fall. He was unable to get up, so 911 was called and he was brought to the hospital via EMS where he was found to have the hip fracture. The patient is currently sedated from having received pain medication. Has not had any medication this morning. Normally ambulates with a walker, but did not use this to get to the bathroom. The patient's family tells me that, in the past, when he has needed surgery, it took 4-5 days to reverse his INR. Patient is currently comfortable  and not complaining of anything except for right hip pain and painful muscle spasms.   Hospital Course:  #1 closed right hip fracture  Patient was admitted secondary to right hip fracture secondary to mechanical fall. On admission patient was noted to have a therapeutic INR. Orthopedics was consulted and patient was seen in consultation by Dr. Ophelia Charter. Patient sign a was reversed with 2 units of FFP as well as 10 mg of IV vitamin K. Patient was initially placed in Buck's traction while awaiting surgery. Patient subsequently underwent right cemented monopolar hemiarthroplasty on 12/11/2039 with no complications. Patient tolerated procedure well. Patient's pain was managed during the hospitalization. Patient was seen by physical therapy. Patient be discharged to a skilled nursing facility in stable and improved condition. Patient will followup with orthopedics as outpatient 2 weeks post discharge.  #2 atrial fibrillation  Patient noted to be history of atrial fibrillation on admission. Patient was maintained on his home regimen beta blocker with good rate control. Patient's Coumadin was held and INR reversed with 2 units of FFP and vitamin K in preparation for surgery. Patient remained stable. Postoperatively patient's Coumadin has been resumed and patient will need a PT/INR checked on Friday, 12/14/2013.  #3 hypertension  Stable. Continued on beta blocker.  #4 BPH  Continued on Hytrin and Proscar.  #5 coronary artery disease status post CABG  Stable. Aspirin on hold during the hospitalization preoperatively and will be resumed on discharge. Patient was maintained on beta blocker and statin.  #6 leukocytosis  Questionable etiology. Likely reactive vs  prob UTI. Chest x-ray which was done was negative. Patient currently afebrile. UA c/w UTI. Urine cultures had multiple bacterial morphotypes. Patient received 5 days of ciprofloxacin. WBC was trending down. Patient needs no further antibiotic treatment on  discharge.  # 7 Probable UTI/bacteria in the urine On admission urinalysis which was done was consistent with a UTI. Urine cultures with multiple bacterial morphotypes. Patient however had dysuria. Patient was initially placed on IV ciprofloxacin which was transitioned to oral ciprofloxacin and received 5 days of antibiotics.      Procedures: Chest x-ray 12/07/2013  X-ray of the right hip 12/07/2013  Right cemented monopolar hemiarthroplasty per Dr. Ophelia Charter 12/10/2013  2 units FFP   Consultations: Orthopedics: Dr. Ophelia Charter 12/07/2013   Discharge Exam: Filed Vitals:   12/12/13 1029  BP: 105/66  Pulse:   Temp:   Resp:     General: NAD Cardiovascular: RRR Respiratory: CTAB  Discharge Instructions      Discharge Orders   Future Appointments Provider Department Dept Phone   02/13/2014 2:30 PM [name], MD [facility] [place] [phone]   02/14/2014 3:15 PM [name], MD [facility] [phone]   Future Orders Complete By Expires   Diet - low sodium heart healthy  As directed    Discharge instructions  As directed    Comments:     Follow up with Dr Ophelia Charter in 2 weeks. Patient will need PT/INR checked on Friday 12/14/13.   Full weight bearing  As directed    Questions:     Laterality:     Extremity:     Posterior total hip precautions  As directed        Medication List         aspirin EC 81 MG tablet  Take 81 mg by mouth daily.     CITRACAL CALCIUM+D 600-40-500 MG-MG-UNIT Tb24  Generic drug:  Calcium-Magnesium-Vitamin D  Take 1 tablet by mouth 2 (two) times daily.     CRESTOR 10 MG tablet  Generic drug:  rosuvastatin  Take 1 tablet by mouth  daily     fexofenadine 180 MG tablet  Commonly known as:  ALLEGRA  Take 180 mg by mouth at bedtime as needed (for allergies).     finasteride 5 MG tablet  Commonly known as:  PROSCAR  Take 1 tablet (5 mg total) by mouth at bedtime.     fluticasone 50 MCG/ACT nasal  spray  Commonly known as:  FLONASE  Place 2 sprays into the nose daily as needed for rhinitis.     metoprolol tartrate 25 MG tablet  Commonly known as:  LOPRESSOR  Take 1 tablet (25 mg total) by mouth 2 (two) times daily.     nitroGLYCERIN 0.4 MG SL tablet  Commonly known as:  NITROSTAT  Place 0.4 mg under the tongue every 5 (five) minutes x 3 doses as needed for chest pain.     omeprazole 40 MG capsule  Commonly known as:  PRILOSEC  Take 40 mg by mouth daily.     oxyCODONE 5 MG immediate release tablet  Commonly known as:  Oxy IR/ROXICODONE  Take 1 tablet (5 mg total) by mouth every 4 (four) hours as needed for moderate pain or severe pain.     polyethylene glycol packet  Commonly known as:  MIRALAX / GLYCOLAX  Take 17 g by mouth daily.     PROLIA 60 MG/ML Soln injection  Generic drug:  denosumab  Inject 60  mg into the skin every 6 (six) months.     terazosin 5 MG capsule  Commonly known as:  HYTRIN  Take 1 capsule (5 mg total) by mouth daily.     warfarin 1 MG tablet  Commonly known as:  COUMADIN  Take 0.5-1 mg by mouth See admin instructions. 0.5 tab on Mondays and Fridays; 1 tab daily on Tuesday, Wednesday, Thursday, Saturday, Sunday     zolpidem 10 MG tablet  Commonly known as:  AMBIEN  Take 10 mg by mouth at bedtime as needed.       Allergies  Allergen Reactions  . Asparaginase Derivatives Other (See Comments)    Makes jittery & causes vision problems, headaches  . Caffeine Other (See Comments)    Interrupts sleep  . Clindamycin     REACTION: upset stomach  . Doxycycline Hyclate     REACTION: n/v/d  . Erythromycin     REACTION: n/v/d  . Lactose Intolerance (Gi)   . Metronidazole     REACTION: rash, itching  . Nitrostat [Nitroglycerin] Diarrhea  . Penicillins     REACTION: hives  . Sulfonamide Derivatives     REACTION: hives  . Tetracycline     REACTION: n/v/d   Follow-up Information   Follow up with Eldred Manges, MD. Schedule an appointment as  soon as possible for a visit in 2 weeks.   Specialty:  Orthopedic Surgery   Contact information:   [address] [place] [address] [phone]       Follow up On 12/14/2013. (Pt will need PT/INR checked on Frid 12/14/13)        The results of significant diagnostics from this hospitalization (including imaging, microbiology, ancillary and laboratory) are listed below for reference.    Significant Diagnostic Studies: Dg Chest 1 View  12/07/2013   CLINICAL DATA:  Fall  EXAM: CHEST - 1 VIEW  COMPARISON:  06/13/2013  FINDINGS: Chronic elevation of the left hemidiaphragm as before. Minimal basilar atelectasis versus scarring. Prior coronary bypass and left subclavian pacemaker. No definite pneumonia or edema. Negative for effusion or pneumothorax. Stable exam.  IMPRESSION: Stable chest exam without superimposed acute process.   Electronically Signed   By: Ruel Favors M.D.   On: 12/07/2013 07:27   Dg Hip Complete Right  12/07/2013   CLINICAL DATA:  Fall, right hip pain  EXAM: RIGHT HIP - COMPLETE 2+ VIEW  COMPARISON:  None.  FINDINGS: There is an acute impacted right hip subcapital femoral neck fracture. No associated subluxation or dislocation. Bony pelvis and left hip intact. Previous L4 vertebral augmentation noted. Nonobstructive bowel gas pattern.  IMPRESSION: Acute right hip subcapital femoral neck fracture   Electronically Signed   By: Ruel Favors M.D.   On: 12/07/2013 07:24   Ct Head Wo Contrast  12/07/2013   CLINICAL DATA:  Fall.  EXAM: CT HEAD WITHOUT CONTRAST  TECHNIQUE: Contiguous axial images were obtained from the base of the skull through the vertex without intravenous contrast.  COMPARISON:  CT 06/13/2013  FINDINGS: Moderate atrophy, unchanged. Mild chronic microvascular ischemic change in the white matter. No acute infarct, hemorrhage, or mass. Negative for subdural hematoma. Negative for skull fracture.  IMPRESSION: Generalized atrophy. Mild chronic microvascular  ischemia. No acute abnormality.   Electronically Signed   By: Marlan Palau M.D.   On: 12/07/2013 08:04   Dg Pelvis Portable  12/10/2013   CLINICAL DATA:  The patient has undergone arthroplasty of the right hip.  EXAM: PORTABLE PELVIS 1-2  VIEWS  COMPARISON:  None.  FINDINGS: The patient has undergone right total hip joint prosthesis placement. Radiographic positioning of the prosthetic device is appear normal. The femoral component of the prosthesis appears to be appropriately positioned. There are surgical skin staples present.  IMPRESSION: The patient has undergone right hip joint prosthesis placement. Radiographic positioning of the prosthetic components is good.   Electronically Signed   By: David  [place]   On: 12/10/2013 16:32    Microbiology: Recent Results (from the past 240 hour(s))  URINE CULTURE     Status: None   Collection Time    12/08/13  9:52 AM      Result Value Range Status   Specimen Description URINE, CLEAN CATCH   Final   Special Requests NONE   Final   Culture  Setup Time     Final   Value: 12/08/2013 13:48     Performed at [facility] Count     Final   Value: >=100,000 COLONIES/ML     Performed at [facility]   Culture     Final   Value: Multiple bacterial morphotypes present, none predominant. Suggest appropriate recollection if clinically indicated.     Performed at [facility]   Report Status 12/09/2013 FINAL   Final  SURGICAL PCR SCREEN     Status: Abnormal   Collection Time    12/09/13  9:09 PM      Result Value Range Status   MRSA, PCR NEGATIVE  NEGATIVE Final   Staphylococcus aureus POSITIVE (*) NEGATIVE Final   Comment:            The Xpert SA Assay (FDA     approved for NASAL specimens     in patients over 3 years of age),     is one component of     a comprehensive surveillance     program.  Test performance has     been validated by [employer] for patients greater     than or equal to 59 year old.      It is not intended     to diagnose infection nor to     guide or monitor treatment.     Labs: Basic Metabolic Panel:  Recent Labs Lab 12/07/13 0643 12/08/13 0410 12/09/13 0550 12/10/13 0525 12/11/13 0605  NA 139 139 139 136 139  K 3.5 4.4 3.8 3.9 4.0  CL 106 104 105 104 104  CO2 26 29 22 21 24   GLUCOSE 103* 113* 99 102* 110*  BUN 13 15 11 11 14   CREATININE 0.81 0.76 0.78 0.71 0.84  CALCIUM 9.1 9.0 8.9 8.9 8.7   Liver Function Tests:  Recent Labs Lab 12/07/13 0643  AST 28  ALT 20  ALKPHOS 68  BILITOT 0.3  PROT 7.3  ALBUMIN 3.6   No results found for this basename: LIPASE, AMYLASE,  in the last 168 hours No results found for this basename: AMMONIA,  in the last 168 hours CBC:  Recent Labs Lab 12/07/13 0643 12/08/13 0410 12/09/13 0550 12/10/13 0525 12/11/13 0605 12/12/13 0353  WBC 9.3 14.3* 13.0* 15.1* 14.3* 13.1*  NEUTROABS 6.2  --  9.8*  --   --   --   HGB 12.8* 12.1* 12.2* 12.8* 11.7* 10.8*  HCT 39.1 36.3* 36.8* 38.5* 35.4* 33.3*  MCV 93.8 93.1 93.4 94.4 93.7 94.1  PLT 278 242 228 232 266 245   Cardiac Enzymes: No results  found for this basename: CKTOTAL, CKMB, CKMBINDEX, TROPONINI,  in the last 168 hours BNP: BNP (last 3 results) No results found for this basename: PROBNP,  in the last 8760 hours CBG: No results found for this basename: GLUCAP,  in the last 168 hours     Signed:  Amiyrah Lamere  [facility] 12/12/2013, 10:51 AM

## 2013-12-15 ENCOUNTER — Non-Acute Institutional Stay (SKILLED_NURSING_FACILITY): Payer: Medicare Other | Admitting: Internal Medicine

## 2013-12-15 ENCOUNTER — Encounter: Payer: Self-pay | Admitting: Internal Medicine

## 2013-12-15 DIAGNOSIS — I4891 Unspecified atrial fibrillation: Secondary | ICD-10-CM

## 2013-12-15 DIAGNOSIS — Z7901 Long term (current) use of anticoagulants: Secondary | ICD-10-CM

## 2013-12-15 DIAGNOSIS — E785 Hyperlipidemia, unspecified: Secondary | ICD-10-CM

## 2013-12-15 DIAGNOSIS — I25810 Atherosclerosis of coronary artery bypass graft(s) without angina pectoris: Secondary | ICD-10-CM | POA: Diagnosis not present

## 2013-12-15 DIAGNOSIS — Z950 Presence of cardiac pacemaker: Secondary | ICD-10-CM

## 2013-12-15 DIAGNOSIS — S72009D Fracture of unspecified part of neck of unspecified femur, subsequent encounter for closed fracture with routine healing: Secondary | ICD-10-CM

## 2013-12-15 DIAGNOSIS — S72001D Fracture of unspecified part of neck of right femur, subsequent encounter for closed fracture with routine healing: Secondary | ICD-10-CM

## 2013-12-15 DIAGNOSIS — N400 Enlarged prostate without lower urinary tract symptoms: Secondary | ICD-10-CM

## 2013-12-15 DIAGNOSIS — I10 Essential (primary) hypertension: Secondary | ICD-10-CM | POA: Diagnosis not present

## 2013-12-15 NOTE — Assessment & Plan Note (Signed)
Continue ASA

## 2013-12-15 NOTE — Assessment & Plan Note (Signed)
Controlled on hytrin and lopressor

## 2013-12-15 NOTE — Progress Notes (Signed)
MRN: [id number] Name: Dwayne [name]  Sex: male Age: 79 y.o. DOB: 05-16-1932  PSC #: [name] Facility/Room: 211 Level Of Care: SNF Provider: Merrilee Seashore D Emergency Contacts: Extended Emergency Contact Information Primary Emergency Contact: Foulks,[name] Address: [address]          [name] Home Phone: [phone] Work Phone: [phone] Relation: Spouse  Code Status:   Allergies: Asparaginase derivatives; Caffeine; Clindamycin; Doxycycline hyclate; Erythromycin; Lactose intolerance (gi); Metronidazole; Nitrostat; Penicillins; Sulfonamide derivatives; and Tetracycline  Chief Complaint  Patient presents with  . nursing home admission    HPI: Patient is 79 y.o. male who is admitted after r hip surgery for OT/PT.  Past Medical History  Diagnosis Date  . Atrial fibrillation     on chronic coumadin  . Hyperlipidemia   . Bronchiectasis without acute exacerbation   . Disorders of diaphragm   . Plantar fascial fibromatosis   . Epistaxis   . Unspecified sinusitis (chronic)   . Allergic rhinitis, cause unspecified   . Spinal stenosis, unspecified region other than cervical   . Hypertension   . Coronary artery disease     s/p CABG 1993, s/p PCI of SVG to OM '03  . Tachy-brady syndrome     s/p Medtronic PPM '07  . Myocardial infarction   . Pneumonia     " multiple times "  . Shortness of breath   . GERD (gastroesophageal reflux disease)   . Arthritis     Past Surgical History  Procedure Laterality Date  . Coronary artery bypass graft      LIMA to LAD, SVG to OM, SVG to left circumflex, SVG to PD/PLSA  . Pacemaker insertion      2007, Medtronic dual-chamber  . Kidney cyst removal    . Doppler echocardiography  2011  . Cholecystectomy    . Vertebroplasty    . Cataract extraction w/ intraocular lens  implant, bilateral    . Hip arthroplasty Right 12/10/2013    Procedure: Right Hip Cemented Hemiarthroplasty;  Surgeon: Eldred Manges, MD;  Location: [facility] OR;  Service: Orthopedics;  Laterality: Right;  Right Hip Cemented Hemiarthroplasty      Medication List       This list is accurate as of: 12/15/13  7:09 PM.  Always use your most recent med list.               aspirin EC 81 MG tablet  Take 81 mg by mouth daily.     CITRACAL CALCIUM+D 600-40-500 MG-MG-UNIT Tb24  Generic drug:  Calcium-Magnesium-Vitamin D  Take 1 tablet by mouth 2 (two) times daily.     CRESTOR 10 MG tablet  Generic drug:  rosuvastatin  Take 1 tablet by mouth  daily     fexofenadine 180 MG tablet  Commonly known as:  ALLEGRA  Take 180 mg by mouth at bedtime as needed (for allergies).     finasteride 5 MG tablet  Commonly known as:  PROSCAR  Take 1 tablet (5 mg total) by mouth at bedtime.     fluticasone 50 MCG/ACT nasal spray  Commonly known as:  FLONASE  Place 2 sprays into the nose daily as needed for rhinitis.     metoprolol tartrate 25 MG tablet  Commonly known as:  LOPRESSOR  Take 1 tablet (25 mg total) by mouth 2 (two) times daily.     nitroGLYCERIN 0.4 MG SL tablet  Commonly known as:  NITROSTAT  Place 0.4 mg under  the tongue every 5 (five) minutes x 3 doses as needed for chest pain.     omeprazole 40 MG capsule  Commonly known as:  PRILOSEC  Take 40 mg by mouth daily.     oxyCODONE 5 MG immediate release tablet  Commonly known as:  Oxy IR/ROXICODONE  Take 1 tablet (5 mg total) by mouth every 4 (four) hours as needed for moderate pain or severe pain.     polyethylene glycol packet  Commonly known as:  MIRALAX / GLYCOLAX  Take 17 g by mouth daily.     PROLIA 60 MG/ML Soln injection  Generic drug:  denosumab  Inject 60 mg into the skin every 6 (six) months.     terazosin 5 MG capsule  Commonly known as:  HYTRIN  Take 1 capsule (5 mg total) by mouth daily.     warfarin 1 MG tablet  Commonly known as:  COUMADIN  Take 0.5-1 mg by mouth See admin instructions. 0.5 tab on Mondays and Fridays; 1 tab daily on  Tuesday, Wednesday, Thursday, Saturday, Sunday        No orders of the defined types were placed in this encounter.    Immunization History  Administered Date(s) Administered  . Influenza Split 11/25/2011  . Influenza Whole 09/26/2013  . Pneumococcal Polysaccharide-23 12/28/2011  . Tdap 07/20/2011  . Zoster 05/25/2012    History  Substance Use Topics  . Smoking status: Never Smoker   . Smokeless tobacco: Never Used  . Alcohol Use: No    Family history is noncontributory    Review of Systems  DATA OBTAINED: from patient; PT HAS NO C/O GENERAL: Feels well no fevers, fatigue, appetite changes SKIN: No itching, rash or wounds EYES: No eye pain, redness, discharge EARS: No earache, tinnitus, change in hearing NOSE: No congestion, drainage or bleeding  MOUTH/THROAT: No mouth or tooth pain, No sore throat, No difficulty chewing or swallowing  RESPIRATORY: No cough, wheezing, SOB CARDIAC: No chest pain, palpitations, lower extremity edema  GI: No abdominal pain, No N/V/D or constipation, No heartburn or reflux  GU: No dysuria, frequency or urgency, or incontinence  MUSCULOSKELETAL: No unrelieved bone/joint pain NEUROLOGIC: No headache, dizziness or focal weakness PSYCHIATRIC: No overt anxiety or sadness. Sleeps well. No behavior issue.   Filed Vitals:   12/15/13 1855  BP: 121/74  Pulse: 67  Temp: 97.8 F (36.6 C)  Resp: 20    Physical Exam  GENERAL APPEARANCE: Alert, conversant. Appropriately groomed. No acute distress.  SKIN: No diaphoresis rash HEAD: Normocephalic, atraumatic  EYES: Conjunctiva/lids clear. Pupils round, reactive. EOMs intact.  EARS: External exam WNL, canals clear. Hearing grossly normal.  NOSE: No deformity or discharge.  MOUTH/THROAT: Lips w/o lesions.  RESPIRATORY: Breathing is even, unlabored. Lung sounds are clear   CARDIOVASCULAR: Heart RRR no murmurs, rubs or gallops. No peripheral edema.  GASTROINTESTINAL: Abdomen is soft, non-tender,  not distended w/ normal bowel sounds. GENITOURINARY: Bladder non tender, not distended  MUSCULOSKELETAL: No abnormal joints or musculature NEUROLOGIC: Oriented X3. Cranial nerves 2-12 grossly intact. Moves all extremities no tremor. PSYCHIATRIC: Mood and affect appropriate to situation, no behavioral issues  Patient Active Problem List   Diagnosis Date Noted  . Bacteria in urine 12/12/2013  . Leukocytosis, unspecified 12/08/2013  . UTI (urinary tract infection) 12/08/2013  . Closed right hip fracture 12/07/2013  . Hip fracture requiring operative repair 12/07/2013  . Mitral regurgitation 02/13/2013  . Compression fracture 10/18/2012  . Constipation, chronic 05/25/2012  . Other abnormal glucose  02/28/2012  . Essential hypertension, benign 02/22/2012  . BPH (benign prostatic hyperplasia) 02/22/2012  . ANEMIA, B12 DEFICIENCY 03/03/2011  . Long term current use of anticoagulant 02/08/2011  . CAD, ARTERY BYPASS GRAFT 08/19/2010  . Cardiac pacemaker in situ 09/02/2009  . Hyperlipidemia LDL goal < 70 10/26/2007  . Atrial fibrillation 10/26/2007  . BRONCHIECTASIS 10/26/2007  . SPINAL STENOSIS 10/26/2007    CBC    Component Value Date/Time   WBC 13.1* 12/12/2013 0353   WBC 10.0 04/28/2012 1042   RBC 3.54* 12/12/2013 0353   RBC 4.57* 04/28/2012 1042   HGB 10.8* 12/12/2013 0353   HGB 12.9* 04/28/2012 1042   HCT 33.3* 12/12/2013 0353   HCT 41.6* 04/28/2012 1042   PLT 245 12/12/2013 0353   MCV 94.1 12/12/2013 0353   MCV 91.0 04/28/2012 1042   LYMPHSABS 1.4 12/09/2013 0550   MONOABS 1.3* 12/09/2013 0550   EOSABS 0.4 12/09/2013 0550   BASOSABS 0.1 12/09/2013 0550    CMP     Component Value Date/Time   NA 139 12/11/2013 0605   K 4.0 12/11/2013 0605   CL 104 12/11/2013 0605   CO2 24 12/11/2013 0605   GLUCOSE 110* 12/11/2013 0605   BUN 14 12/11/2013 0605   CREATININE 0.84 12/11/2013 0605   CALCIUM 8.7 12/11/2013 0605   PROT 7.3 12/07/2013 0643   ALBUMIN 3.6 12/07/2013 0643    AST 28 12/07/2013 0643   ALT 20 12/07/2013 0643   ALKPHOS 68 12/07/2013 0643   BILITOT 0.3 12/07/2013 0643   GFRNONAA 81* 12/11/2013 0605   GFRAA >90 12/11/2013 0605    Assessment and Plan  Hip fracture requiring operative repair In SNF for OT/PT  Atrial fibrillation Continue B blocker and coumadin; INR 12/26 was theraputic  Essential hypertension, benign Controlled on hytrin and lopressor  CAD, ARTERY BYPASS GRAFT Continue ASA  BPH (benign prostatic hyperplasia) Continue hytrin and proscar  Hyperlipidemia LDL goal < 70 Continue crestor    Margit Hanks, MD

## 2013-12-15 NOTE — Assessment & Plan Note (Signed)
Continue hytrin and proscar

## 2013-12-15 NOTE — Assessment & Plan Note (Signed)
Continue crestor 

## 2013-12-15 NOTE — Assessment & Plan Note (Signed)
In SNF for OT/PT

## 2013-12-15 NOTE — Assessment & Plan Note (Signed)
Continue B blocker and coumadin; INR 12/26 was theraputic

## 2013-12-17 ENCOUNTER — Other Ambulatory Visit: Payer: Self-pay | Admitting: *Deleted

## 2013-12-17 MED ORDER — ZOLPIDEM TARTRATE 10 MG PO TABS: 10.0000 mg | ORAL_TABLET | Freq: Every evening | ORAL | Status: DC | PRN

## 2013-12-18 ENCOUNTER — Non-Acute Institutional Stay (SKILLED_NURSING_FACILITY): Payer: Medicare Other | Admitting: Nurse Practitioner

## 2013-12-18 ENCOUNTER — Encounter: Payer: Self-pay | Admitting: Nurse Practitioner

## 2013-12-18 DIAGNOSIS — S72009D Fracture of unspecified part of neck of unspecified femur, subsequent encounter for closed fracture with routine healing: Secondary | ICD-10-CM | POA: Diagnosis not present

## 2013-12-18 DIAGNOSIS — S72001D Fracture of unspecified part of neck of right femur, subsequent encounter for closed fracture with routine healing: Secondary | ICD-10-CM

## 2013-12-18 DIAGNOSIS — L0291 Cutaneous abscess, unspecified: Secondary | ICD-10-CM

## 2013-12-18 DIAGNOSIS — L0390 Cellulitis, unspecified: Secondary | ICD-10-CM

## 2013-12-18 NOTE — Progress Notes (Signed)
Patient ID: Dwayne [name], male   DOB: [birth year], 79 y.o.   MRN: [id number]    Nursing Home Location:  [facility]   Place of Service: SNF (31)  PCP: Sanda Linger, MD  Allergies  Allergen Reactions  . Asparaginase Derivatives Other (See Comments)    Makes jittery & causes vision problems, headaches  . Caffeine Other (See Comments)    Interrupts sleep  . Clindamycin     REACTION: upset stomach  . Doxycycline Hyclate     REACTION: n/v/d  . Erythromycin     REACTION: n/v/d  . Lactose Intolerance (Gi)   . Metronidazole     REACTION: rash, itching  . Nitrostat [Nitroglycerin] Diarrhea  . Penicillins     REACTION: hives  . Sulfonamide Derivatives     REACTION: hives  . Tetracycline     REACTION: n/v/d    Chief Complaint  Patient presents with  . Acute Visit    HPI:  79 year old male with recent right hip hemiarthroplasty due to fracture who is at [facility] for rehab is being seen today due to worsening redness and heat to incision. Pt reports pain to site but receieved relief when given medication  No fevers or chills  Review of Systems:  Review of Systems  Constitutional: Negative for fever and malaise/fatigue.  Respiratory: Negative for cough.   Cardiovascular: Negative for chest pain.  Gastrointestinal: Negative for abdominal pain, diarrhea and constipation.  Musculoskeletal: Positive for joint pain.  Skin: Negative for itching and rash.  Neurological: Positive for weakness. Negative for headaches.     Past Medical History  Diagnosis Date  . Atrial fibrillation     on chronic coumadin  . Hyperlipidemia   . Bronchiectasis without acute exacerbation   . Disorders of diaphragm   . Plantar fascial fibromatosis   . Epistaxis   . Unspecified sinusitis (chronic)   . Allergic rhinitis, cause unspecified   . Spinal stenosis, unspecified region other than cervical   . Hypertension   . Coronary artery disease     s/p CABG 1993, s/p PCI of SVG to OM  '03  . Tachy-brady syndrome     s/p Medtronic PPM '07  . Myocardial infarction   . Pneumonia     " multiple times "  . Shortness of breath   . GERD (gastroesophageal reflux disease)   . Arthritis    Past Surgical History  Procedure Laterality Date  . Coronary artery bypass graft      LIMA to LAD, SVG to OM, SVG to left circumflex, SVG to PD/PLSA  . Pacemaker insertion      2007, Medtronic dual-chamber  . Kidney cyst removal    . Doppler echocardiography  2011  . Cholecystectomy    . Vertebroplasty    . Cataract extraction w/ intraocular lens  implant, bilateral    . Hip arthroplasty Right 12/10/2013    Procedure: Right Hip Cemented Hemiarthroplasty;  Surgeon: Eldred Manges, MD;  Location: [facility] OR;  Service: Orthopedics;  Laterality: Right;  Right Hip Cemented Hemiarthroplasty   Social History:   reports that he has never smoked. He has never used smokeless tobacco. He reports that he does not drink alcohol or use illicit drugs.  Family History  Problem Relation Age of Onset  . Cancer Neg Hx   . Heart disease Neg Hx   . Stroke Father     Medications: Patient's Medications  New Prescriptions   No medications on file  Previous  Medications   ASPIRIN EC 81 MG TABLET    Take 81 mg by mouth daily.   CALCIUM-MAGNESIUM-VITAMIN D (CITRACAL CALCIUM+D) 600-40-500 MG-MG-UNIT TB24    Take 1 tablet by mouth 2 (two) times daily.    CRESTOR 10 MG TABLET    Take 1 tablet by mouth  daily   FEXOFENADINE (ALLEGRA) 180 MG TABLET    Take 180 mg by mouth at bedtime as needed (for allergies).    FINASTERIDE (PROSCAR) 5 MG TABLET    Take 1 tablet (5 mg total) by mouth at bedtime.   FLUTICASONE (FLONASE) 50 MCG/ACT NASAL SPRAY    Place 2 sprays into the nose daily as needed for rhinitis.   METOPROLOL TARTRATE (LOPRESSOR) 25 MG TABLET    Take 1 tablet (25 mg total) by mouth 2 (two) times daily.   NITROGLYCERIN (NITROSTAT) 0.4 MG SL TABLET    Place 0.4 mg under the tongue every 5 (five) minutes x 3  doses as needed for chest pain.   OMEPRAZOLE (PRILOSEC) 40 MG CAPSULE    Take 40 mg by mouth daily.     OXYCODONE (OXY IR/ROXICODONE) 5 MG IMMEDIATE RELEASE TABLET    Take 1 tablet (5 mg total) by mouth every 4 (four) hours as needed for moderate pain or severe pain.   POLYETHYLENE GLYCOL (MIRALAX / GLYCOLAX) PACKET    Take 17 g by mouth daily.   PROLIA 60 MG/ML SOLN INJECTION    Inject 60 mg into the skin every 6 (six) months.    TERAZOSIN (HYTRIN) 5 MG CAPSULE    Take 1 capsule (5 mg total) by mouth daily.   WARFARIN (COUMADIN) 1 MG TABLET    Take 0.5-1 mg by mouth See admin instructions. 0.5 tab on Mondays and Fridays; 1 tab daily on Tuesday, Wednesday, Thursday, Saturday, Sunday   ZOLPIDEM (AMBIEN) 10 MG TABLET    Take 1 tablet (10 mg total) by mouth at bedtime as needed for sleep.  Modified Medications   No medications on file  Discontinued Medications   No medications on file     Physical Exam: Physical Exam  Constitutional: He is well-developed, well-nourished, and in no distress.  Cardiovascular: Normal rate.   Murmur heard. Pulmonary/Chest: Effort normal and breath sounds normal. No respiratory distress.  Abdominal: Soft. Bowel sounds are normal.  Musculoskeletal: He exhibits tenderness (to hip). He exhibits no edema.  Neurological: He is alert.  Skin: Skin is warm and dry. There is erythema (to right hip incision; with warmth noted; no drainage or swelling).     Filed Vitals:   12/18/13 1207  BP: 108/69  Pulse: 63  Temp: 97 F (36.1 C)  Resp: 20      Labs reviewed: Basic Metabolic Panel:  Recent Labs  16/10/96 0550 12/10/13 0525 12/11/13 0605  NA 139 136 139  K 3.8 3.9 4.0  CL 105 104 104  CO2 22 21 24   GLUCOSE 99 102* 110*  BUN 11 11 14   CREATININE 0.78 0.71 0.84  CALCIUM 8.9 8.9 8.7   Liver Function Tests:  Recent Labs  06/13/13 1536 06/20/13 1350 12/07/13 0643  AST 20 21 28   ALT 13 16 20   ALKPHOS 68 65 68  BILITOT 0.2* 0.7 0.3  PROT 7.2  7.4 7.3  ALBUMIN 3.3* 3.8 3.6   No results found for this basename: LIPASE, AMYLASE,  in the last 8760 hours No results found for this basename: AMMONIA,  in the last 8760 hours CBC:  Recent Labs  06/20/13 1350 12/07/13 0643  12/09/13 0550 12/10/13 0525 12/11/13 0605 12/12/13 0353  WBC 10.2 9.3  < > 13.0* 15.1* 14.3* 13.1*  NEUTROABS 7.2 6.2  --  9.8*  --   --   --   HGB 13.0 12.8*  < > 12.2* 12.8* 11.7* 10.8*  HCT 39.8 39.1  < > 36.8* 38.5* 35.4* 33.3*  MCV 92.2 93.8  < > 93.4 94.4 93.7 94.1  PLT 238.0 278  < > 228 232 266 245  < > = values in this interval not displayed. Cardiac Enzymes: No results found for this basename: CKTOTAL, CKMB, CKMBINDEX, TROPONINI,  in the last 8760 hours BNP: No components found with this basename: POCBNP,  CBG: No results found for this basename: GLUCAP,  in the last 8760 hours TSH: No results found for this basename: TSH,  in the last 8760 hours A1C: Lab Results  Component Value Date   HGBA1C 6.2 03/14/2012   Lipid Panel:  Recent Labs  06/20/13 1350  CHOL 137  HDL 48.50  LDLCALC 68  TRIG 104.0  CHOLHDL 3     Assessment/Plan 1. Hip fracture requiring operative repair, right, closed, with routine healing, subsequent encounter -conts with therapy reports worsening pain to right hip with worsening redness and warmth to incision and around staples; will have staff make follow up appt with orthopedics asap, to see if staples can safely be removed as these may be causing/contrubiting to cellulitis which is more so around staples then the actual incision  2. Cellulitis - warmth and redness around surgerical site/staples  -pt with multiple allergies therefore will start Levaquin 500 mg by mouth daily for 7 days

## 2013-12-19 DIAGNOSIS — S72043B Displaced fracture of base of neck of unspecified femur, initial encounter for open fracture type I or II: Secondary | ICD-10-CM | POA: Diagnosis not present

## 2014-01-04 ENCOUNTER — Non-Acute Institutional Stay (SKILLED_NURSING_FACILITY): Payer: Medicare Other | Admitting: Nurse Practitioner

## 2014-01-04 ENCOUNTER — Encounter: Payer: Self-pay | Admitting: Nurse Practitioner

## 2014-01-04 DIAGNOSIS — I4891 Unspecified atrial fibrillation: Secondary | ICD-10-CM | POA: Diagnosis not present

## 2014-01-04 DIAGNOSIS — K5900 Constipation, unspecified: Secondary | ICD-10-CM

## 2014-01-04 DIAGNOSIS — J309 Allergic rhinitis, unspecified: Secondary | ICD-10-CM | POA: Insufficient documentation

## 2014-01-04 DIAGNOSIS — I10 Essential (primary) hypertension: Secondary | ICD-10-CM | POA: Diagnosis not present

## 2014-01-04 DIAGNOSIS — M810 Age-related osteoporosis without current pathological fracture: Secondary | ICD-10-CM | POA: Insufficient documentation

## 2014-01-04 DIAGNOSIS — E785 Hyperlipidemia, unspecified: Secondary | ICD-10-CM

## 2014-01-04 DIAGNOSIS — K5909 Other constipation: Secondary | ICD-10-CM

## 2014-01-04 DIAGNOSIS — S72001A Fracture of unspecified part of neck of right femur, initial encounter for closed fracture: Secondary | ICD-10-CM

## 2014-01-04 DIAGNOSIS — N400 Enlarged prostate without lower urinary tract symptoms: Secondary | ICD-10-CM

## 2014-01-04 DIAGNOSIS — S72009A Fracture of unspecified part of neck of unspecified femur, initial encounter for closed fracture: Secondary | ICD-10-CM | POA: Diagnosis not present

## 2014-01-04 MED ORDER — TRAZODONE HCL 50 MG PO TABS: 25.0000 mg | ORAL_TABLET | Freq: Every day | ORAL | Status: DC

## 2014-01-04 NOTE — Progress Notes (Signed)
Patient ID: Dwayne [name], male   DOB: August 30, 1932, 80 y.o.   MRN: [id number]    Nursing Home Location:  [facility]   Place of Service: SNF (31)  PCP: Sanda Linger, MD  Allergies  Allergen Reactions  . Asparaginase Derivatives Other (See Comments)    Makes jittery & causes vision problems, headaches  . Caffeine Other (See Comments)    Interrupts sleep  . Clindamycin     REACTION: upset stomach  . Doxycycline Hyclate     REACTION: n/v/d  . Erythromycin     REACTION: n/v/d  . Lactose Intolerance (Gi)   . Metronidazole     REACTION: rash, itching  . Nitrostat [Nitroglycerin] Diarrhea  . Penicillins     REACTION: hives  . Sulfonamide Derivatives     REACTION: hives  . Tetracycline     REACTION: n/v/d    Chief Complaint  Patient presents with  . Medical Managment of Chronic Issues    HPI:  80 year old male with recent right hip hemiarthroplasty due to fracture who is at [facility] for rehab is being seen today for routine follow up on chronic conditions; pt reports he has not been getting proper sleep; would like something different. Working with therapy in hopes to go home soon. Otherwise doing good; reports pain is well controlled   Review of Systems:  Review of Systems  Constitutional: Negative for fever and malaise/fatigue.  Respiratory: Negative for cough.   Cardiovascular: Negative for chest pain.  Gastrointestinal: Negative for abdominal pain, diarrhea and constipation.  Musculoskeletal: Positive for joint pain.  Skin: Negative for itching and rash.  Neurological: Positive for weakness. Negative for dizziness and headaches.  Psychiatric/Behavioral: Negative for depression. The patient has insomnia.      Past Medical History  Diagnosis Date  . Atrial fibrillation     on chronic coumadin  . Hyperlipidemia   . Bronchiectasis without acute exacerbation   . Disorders of diaphragm   . Plantar fascial fibromatosis   . Epistaxis   . Unspecified  sinusitis (chronic)   . Allergic rhinitis, cause unspecified   . Spinal stenosis, unspecified region other than cervical   . Hypertension   . Coronary artery disease     s/p CABG 1993, s/p PCI of SVG to OM '03  . Tachy-brady syndrome     s/p Medtronic PPM '07  . Myocardial infarction   . Pneumonia     " multiple times "  . Shortness of breath   . GERD (gastroesophageal reflux disease)   . Arthritis    Past Surgical History  Procedure Laterality Date  . Coronary artery bypass graft      LIMA to LAD, SVG to OM, SVG to left circumflex, SVG to PD/PLSA  . Pacemaker insertion      2007, Medtronic dual-chamber  . Kidney cyst removal    . Doppler echocardiography  2011  . Cholecystectomy    . Vertebroplasty    . Cataract extraction w/ intraocular lens  implant, bilateral    . Hip arthroplasty Right 12/10/2013    Procedure: Right Hip Cemented Hemiarthroplasty;  Surgeon: Eldred Manges, MD;  Location: [facility] OR;  Service: Orthopedics;  Laterality: Right;  Right Hip Cemented Hemiarthroplasty   Social History:   reports that he has never smoked. He has never used smokeless tobacco. He reports that he does not drink alcohol or use illicit drugs.  Family History  Problem Relation Age of Onset  . Cancer Neg Hx   .  Heart disease Neg Hx   . Stroke Father     Medications: Patient's Medications  New Prescriptions   No medications on file  Previous Medications   ASPIRIN EC 81 MG TABLET    Take 81 mg by mouth daily.   CALCIUM-MAGNESIUM-VITAMIN D (CITRACAL CALCIUM+D) 600-40-500 MG-MG-UNIT TB24    Take 1 tablet by mouth 2 (two) times daily.    CRESTOR 10 MG TABLET    Take 1 tablet by mouth  daily   FEXOFENADINE (ALLEGRA) 180 MG TABLET    Take 180 mg by mouth at bedtime as needed (for allergies).    FINASTERIDE (PROSCAR) 5 MG TABLET    Take 1 tablet (5 mg total) by mouth at bedtime.   FLUTICASONE (FLONASE) 50 MCG/ACT NASAL SPRAY    Place 2 sprays into the nose daily as needed for rhinitis.    METOPROLOL TARTRATE (LOPRESSOR) 25 MG TABLET    Take 1 tablet (25 mg total) by mouth 2 (two) times daily.   NITROGLYCERIN (NITROSTAT) 0.4 MG SL TABLET    Place 0.4 mg under the tongue every 5 (five) minutes x 3 doses as needed for chest pain.   OMEPRAZOLE (PRILOSEC) 40 MG CAPSULE    Take 40 mg by mouth daily.     OXYCODONE (OXY IR/ROXICODONE) 5 MG IMMEDIATE RELEASE TABLET    Take 1 tablet (5 mg total) by mouth every 4 (four) hours as needed for moderate pain or severe pain.   POLYETHYLENE GLYCOL (MIRALAX / GLYCOLAX) PACKET    Take 17 g by mouth daily.   PROLIA 60 MG/ML SOLN INJECTION    Inject 60 mg into the skin every 6 (six) months.    TERAZOSIN (HYTRIN) 5 MG CAPSULE    Take 1 capsule (5 mg total) by mouth daily.   WARFARIN (COUMADIN) 1 MG TABLET    Take 0.5-1 mg by mouth See admin instructions. 0.5 tab on Mondays and Fridays; 1 tab daily on Tuesday, Wednesday, Thursday, Saturday, Sunday   ZOLPIDEM (AMBIEN) 10 MG TABLET    Take 1 tablet (10 mg total) by mouth at bedtime as needed for sleep.  Modified Medications   No medications on file  Discontinued Medications   No medications on file     Physical Exam: Physical Exam  Constitutional: He is well-developed, well-nourished, and in no distress.  HENT:  Mouth/Throat: Oropharynx is clear and moist. No oropharyngeal exudate.  Cardiovascular: Normal rate.   Murmur heard. Pulmonary/Chest: Effort normal and breath sounds normal. No respiratory distress.  Abdominal: Soft. Bowel sounds are normal.  Musculoskeletal: He exhibits tenderness (to hip). He exhibits no edema.  Neurological: He is alert.  Skin: Skin is warm and dry.  Well healed incision     Filed Vitals:   01/04/14 1533  BP: 119/43  Pulse: 69  Temp: 97.9 F (36.6 C)  Resp: 20      Labs reviewed: Basic Metabolic Panel:  Recent Labs  16/10/96 0550 12/10/13 0525 12/11/13 0605  NA 139 136 139  K 3.8 3.9 4.0  CL 105 104 104  CO2 22 21 24   GLUCOSE 99 102* 110*  BUN  11 11 14   CREATININE 0.78 0.71 0.84  CALCIUM 8.9 8.9 8.7   Liver Function Tests:  Recent Labs  06/13/13 1536 06/20/13 1350 12/07/13 0643  AST 20 21 28   ALT 13 16 20   ALKPHOS 68 65 68  BILITOT 0.2* 0.7 0.3  PROT 7.2 7.4 7.3  ALBUMIN 3.3* 3.8 3.6   No results found  for this basename: LIPASE, AMYLASE,  in the last 8760 hours No results found for this basename: AMMONIA,  in the last 8760 hours CBC:  Recent Labs  06/20/13 1350 12/07/13 0643  12/09/13 0550 12/10/13 0525 12/11/13 0605 12/12/13 0353  WBC 10.2 9.3  < > 13.0* 15.1* 14.3* 13.1*  NEUTROABS 7.2 6.2  --  9.8*  --   --   --   HGB 13.0 12.8*  < > 12.2* 12.8* 11.7* 10.8*  HCT 39.8 39.1  < > 36.8* 38.5* 35.4* 33.3*  MCV 92.2 93.8  < > 93.4 94.4 93.7 94.1  PLT 238.0 278  < > 228 232 266 245  < > = values in this interval not displayed. Cardiac Enzymes: No results found for this basename: CKTOTAL, CKMB, CKMBINDEX, TROPONINI,  in the last 8760 hours BNP: No components found with this basename: POCBNP,  CBG: No results found for this basename: GLUCAP,  in the last 8760 hours TSH: No results found for this basename: TSH,  in the last 8760 hours A1C: Lab Results  Component Value Date   HGBA1C 6.2 03/14/2012   Lipid Panel:  Recent Labs  06/20/13 1350  CHOL 137  HDL 48.50  LDLCALC 68  TRIG 104.0  CHOLHDL 3     Assessment/Plan 1. Closed right hip fracture S/p repair; working with therapy in hopes to return home with wife -pain is well controlled  2. Constipation, chronic -stable at this time  3. Essential hypertension, benign -Patient is stable; continue current regimen. Will monitor and make changes as necessary.  4. Atrial fibrillation -rate controlled on current medications, INR therapeutic and on long term anticoagulation   5. Hyperlipidemia LDL goal < 70 -conts on crestor  6. BPH (benign prostatic hyperplasia) -stable on hytrin and poscar  7. Osteoporosis, unspecified -on prolia  injections  8. Allergic rhinitis -stable on flonase and allegra  9. Insomnia  - will stop Ambien and start trazodone 50 mg qhs  melatonin 3 mg qhs

## 2014-01-14 ENCOUNTER — Encounter: Payer: Self-pay | Admitting: Internal Medicine

## 2014-01-14 ENCOUNTER — Non-Acute Institutional Stay (SKILLED_NURSING_FACILITY): Payer: Medicare Other | Admitting: Internal Medicine

## 2014-01-14 DIAGNOSIS — E785 Hyperlipidemia, unspecified: Secondary | ICD-10-CM

## 2014-01-14 DIAGNOSIS — M4800 Spinal stenosis, site unspecified: Secondary | ICD-10-CM

## 2014-01-14 DIAGNOSIS — D518 Other vitamin B12 deficiency anemias: Secondary | ICD-10-CM

## 2014-01-14 DIAGNOSIS — Z7901 Long term (current) use of anticoagulants: Secondary | ICD-10-CM

## 2014-01-14 DIAGNOSIS — S72009A Fracture of unspecified part of neck of unspecified femur, initial encounter for closed fracture: Secondary | ICD-10-CM

## 2014-01-14 DIAGNOSIS — I10 Essential (primary) hypertension: Secondary | ICD-10-CM | POA: Diagnosis not present

## 2014-01-14 DIAGNOSIS — J309 Allergic rhinitis, unspecified: Secondary | ICD-10-CM

## 2014-01-14 DIAGNOSIS — N400 Enlarged prostate without lower urinary tract symptoms: Secondary | ICD-10-CM | POA: Diagnosis not present

## 2014-01-14 DIAGNOSIS — I4891 Unspecified atrial fibrillation: Secondary | ICD-10-CM | POA: Diagnosis not present

## 2014-01-14 DIAGNOSIS — Z950 Presence of cardiac pacemaker: Secondary | ICD-10-CM

## 2014-01-14 DIAGNOSIS — I25810 Atherosclerosis of coronary artery bypass graft(s) without angina pectoris: Secondary | ICD-10-CM

## 2014-01-14 NOTE — Progress Notes (Signed)
MRN: [id number] Name: Dwayne [name]  Sex: male Age: 80 y.o. DOB: August 30, 1932  PSC #: [name] Facility/Room: 213 Level Of Care: SNF Provider: Merrilee Seashore D Emergency Contacts: Extended Emergency Contact Information Primary Emergency Contact: Dinius,[name] Address: [address]          [name] Home Phone: [phone] Work Phone: [phone] Relation: Spouse  Code Status: FULL  Allergies: Asparaginase derivatives; Caffeine; Clindamycin; Doxycycline hyclate; Erythromycin; Lactose intolerance (gi); Metronidazole; Nitrostat; Penicillins; Sulfonamide derivatives; and Tetracycline  Chief Complaint  Patient presents with  . Discharge Note    HPI: Patient is 80 y.o. male who fractured his left hip with repair who was admitted for therapy who is now ready to be discharged to home.  Past Medical History  Diagnosis Date  . Atrial fibrillation     on chronic coumadin  . Hyperlipidemia   . Bronchiectasis without acute exacerbation   . Disorders of diaphragm   . Plantar fascial fibromatosis   . Epistaxis   . Unspecified sinusitis (chronic)   . Allergic rhinitis, cause unspecified   . Spinal stenosis, unspecified region other than cervical   . Hypertension   . Coronary artery disease     s/p CABG 1993, s/p PCI of SVG to OM '03  . Tachy-brady syndrome     s/p Medtronic PPM '07  . Myocardial infarction   . Pneumonia     " multiple times "  . Shortness of breath   . GERD (gastroesophageal reflux disease)   . Arthritis     Past Surgical History  Procedure Laterality Date  . Coronary artery bypass graft      LIMA to LAD, SVG to OM, SVG to left circumflex, SVG to PD/PLSA  . Pacemaker insertion      2007, Medtronic dual-chamber  . Kidney cyst removal    . Doppler echocardiography  2011  . Cholecystectomy    . Vertebroplasty    . Cataract extraction w/ intraocular lens  implant, bilateral    . Hip arthroplasty Right 12/10/2013     Procedure: Right Hip Cemented Hemiarthroplasty;  Surgeon: Eldred Manges, MD;  Location: [facility] OR;  Service: Orthopedics;  Laterality: Right;  Right Hip Cemented Hemiarthroplasty      Medication List       This list is accurate as of: 01/14/14  8:31 PM.  Always use your most recent med list.               aspirin EC 81 MG tablet  Take 81 mg by mouth daily.     CITRACAL CALCIUM+D 600-40-500 MG-MG-UNIT Tb24  Generic drug:  Calcium-Magnesium-Vitamin D  Take 1 tablet by mouth 2 (two) times daily.     CRESTOR 10 MG tablet  Generic drug:  rosuvastatin  Take 1 tablet by mouth  daily     fexofenadine 180 MG tablet  Commonly known as:  ALLEGRA  Take 180 mg by mouth at bedtime as needed (for allergies).     finasteride 5 MG tablet  Commonly known as:  PROSCAR  Take 1 tablet (5 mg total) by mouth at bedtime.     fluticasone 50 MCG/ACT nasal spray  Commonly known as:  FLONASE  Place 2 sprays into the nose daily as needed for rhinitis.     Melatonin 3 MG Tabs  Take 3 mg by mouth at bedtime.     metoprolol tartrate 25 MG tablet  Commonly known as:  LOPRESSOR  Take 1 tablet (25 mg  total) by mouth 2 (two) times daily.     nitroGLYCERIN 0.4 MG SL tablet  Commonly known as:  NITROSTAT  Place 0.4 mg under the tongue every 5 (five) minutes x 3 doses as needed for chest pain.     omeprazole 40 MG capsule  Commonly known as:  PRILOSEC  Take 40 mg by mouth daily.     oxyCODONE 5 MG immediate release tablet  Commonly known as:  Oxy IR/ROXICODONE  Take 1 tablet (5 mg total) by mouth every 4 (four) hours as needed for moderate pain or severe pain.     polyethylene glycol packet  Commonly known as:  MIRALAX / GLYCOLAX  Take 17 g by mouth daily.     PROLIA 60 MG/ML Soln injection  Generic drug:  denosumab  Inject 60 mg into the skin every 6 (six) months.     terazosin 5 MG capsule  Commonly known as:  HYTRIN  Take 1 capsule (5 mg total) by mouth daily.     traZODone 50 MG tablet   Commonly known as:  DESYREL  Take 50 mg by mouth at bedtime.     warfarin 1 MG tablet  Commonly known as:  COUMADIN  Take 0.5-1 mg by mouth See admin instructions. 0.5 tab on Mondays, Wed Fridays; 1 tab daily on Tuesday, Thursday, Saturday, Sunday        Meds ordered this encounter  Medications  . Melatonin 3 MG TABS    Sig: Take 3 mg by mouth at bedtime.  . traZODone (DESYREL) 50 MG tablet    Sig: Take 50 mg by mouth at bedtime.    Immunization History  Administered Date(s) Administered  . Influenza Split 11/25/2011  . Influenza Whole 09/26/2013  . Pneumococcal Polysaccharide-23 12/28/2011  . Tdap 07/20/2011  . Zoster 05/25/2012    History  Substance Use Topics  . Smoking status: Never Smoker   . Smokeless tobacco: Never Used  . Alcohol Use: No    Filed Vitals:   01/14/14 1400  BP: 105/59  Pulse: 61  Temp: 97.7 F (36.5 C)  Resp: 20    Physical Exam  GENERAL APPEARANCE: Alert, conversant. Appropriately groomed. No acute distress.  HEENT: Unremarkable. RESPIRATORY: Breathing is even, unlabored. Lung sounds are clear   CARDIOVASCULAR: Heart RRR no murmurs, rubs or gallops. No peripheral edema.  GASTROINTESTINAL: Abdomen is soft, non-tender, not distended w/ normal bowel sounds.  NEUROLOGIC: Cranial nerves 2-12 grossly intact. Moves all extremities no tremor.  Patient Active Problem List   Diagnosis Date Noted  . Osteoporosis, unspecified 01/04/2014  . Allergic rhinitis 01/04/2014  . Bacteria in urine 12/12/2013  . Leukocytosis, unspecified 12/08/2013  . UTI (urinary tract infection) 12/08/2013  . Closed right hip fracture 12/07/2013  . Hip fracture requiring operative repair 12/07/2013  . Mitral regurgitation 02/13/2013  . Compression fracture 10/18/2012  . Constipation, chronic 05/25/2012  . Other abnormal glucose 02/28/2012  . Essential hypertension, benign 02/22/2012  . BPH (benign prostatic hyperplasia) 02/22/2012  . ANEMIA, B12 DEFICIENCY  03/03/2011  . Long term current use of anticoagulant 02/08/2011  . CAD, ARTERY BYPASS GRAFT 08/19/2010  . Cardiac pacemaker in situ 09/02/2009  . Hyperlipidemia LDL goal < 70 10/26/2007  . Atrial fibrillation 10/26/2007  . BRONCHIECTASIS 10/26/2007  . SPINAL STENOSIS 10/26/2007    CBC    Component Value Date/Time   WBC 13.1* 12/12/2013 0353   WBC 10.0 04/28/2012 1042   RBC 3.54* 12/12/2013 0353   RBC 4.57* 04/28/2012 1042  HGB 10.8* 12/12/2013 0353   HGB 12.9* 04/28/2012 1042   HCT 33.3* 12/12/2013 0353   HCT 41.6* 04/28/2012 1042   PLT 245 12/12/2013 0353   MCV 94.1 12/12/2013 0353   MCV 91.0 04/28/2012 1042   LYMPHSABS 1.4 12/09/2013 0550   MONOABS 1.3* 12/09/2013 0550   EOSABS 0.4 12/09/2013 0550   BASOSABS 0.1 12/09/2013 0550    CMP     Component Value Date/Time   NA 139 12/11/2013 0605   K 4.0 12/11/2013 0605   CL 104 12/11/2013 0605   CO2 24 12/11/2013 0605   GLUCOSE 110* 12/11/2013 0605   BUN 14 12/11/2013 0605   CREATININE 0.84 12/11/2013 0605   CALCIUM 8.7 12/11/2013 0605   PROT 7.3 12/07/2013 0643   ALBUMIN 3.6 12/07/2013 0643   AST 28 12/07/2013 0643   ALT 20 12/07/2013 0643   ALKPHOS 68 12/07/2013 0643   BILITOT 0.3 12/07/2013 0643   GFRNONAA 81* 12/11/2013 0605   GFRAA >90 12/11/2013 0605    Assessment and Plan  Pt is being discharged to home in stable and improved condition with HH, OT,PT. Pt will need a PT/INR on 01/16/2014.  Margit HanksALEXANDER, ANNE D, MD

## 2014-01-17 ENCOUNTER — Telehealth: Payer: Self-pay | Admitting: Cardiology

## 2014-01-17 ENCOUNTER — Ambulatory Visit (INDEPENDENT_AMBULATORY_CARE_PROVIDER_SITE_OTHER): Payer: Medicare Other | Admitting: Pharmacist

## 2014-01-17 DIAGNOSIS — S81809A Unspecified open wound, unspecified lower leg, initial encounter: Secondary | ICD-10-CM | POA: Diagnosis not present

## 2014-01-17 DIAGNOSIS — Z7901 Long term (current) use of anticoagulants: Secondary | ICD-10-CM

## 2014-01-17 DIAGNOSIS — I4891 Unspecified atrial fibrillation: Secondary | ICD-10-CM

## 2014-01-17 DIAGNOSIS — M25559 Pain in unspecified hip: Secondary | ICD-10-CM | POA: Diagnosis not present

## 2014-01-17 DIAGNOSIS — S81009A Unspecified open wound, unspecified knee, initial encounter: Secondary | ICD-10-CM | POA: Diagnosis not present

## 2014-01-17 DIAGNOSIS — S72009D Fracture of unspecified part of neck of unspecified femur, subsequent encounter for closed fracture with routine healing: Secondary | ICD-10-CM | POA: Diagnosis not present

## 2014-01-17 DIAGNOSIS — Z5181 Encounter for therapeutic drug level monitoring: Secondary | ICD-10-CM

## 2014-01-17 DIAGNOSIS — I10 Essential (primary) hypertension: Secondary | ICD-10-CM | POA: Diagnosis not present

## 2014-01-17 DIAGNOSIS — I2510 Atherosclerotic heart disease of native coronary artery without angina pectoris: Secondary | ICD-10-CM

## 2014-01-17 DIAGNOSIS — S91009A Unspecified open wound, unspecified ankle, initial encounter: Secondary | ICD-10-CM

## 2014-01-17 LAB — POCT INR: INR: 1.4

## 2014-01-17 NOTE — Telephone Encounter (Signed)
New problem:  Pt's wife is requesting a call back from one of the coumadin nurses.

## 2014-01-17 NOTE — Telephone Encounter (Signed)
Spoke with pt's wife.  [facility] this [facility].  He was there for rehab after a hip replacement.  Pt's wife states he was told he would need an INR on 1/28.  [facility] was [facility] with [facility] but she has not heard from them yet.  Spoke with [facility].  They are scheduled to open services today.  Dawn with the [place] office states there is an order for a PT/INR today.  Called pt- he is aware he should be hearing from them today and to call if he has not heard from the RN or PT by early afternoon.

## 2014-01-20 DIAGNOSIS — S72009D Fracture of unspecified part of neck of unspecified femur, subsequent encounter for closed fracture with routine healing: Secondary | ICD-10-CM | POA: Diagnosis not present

## 2014-01-20 DIAGNOSIS — S81009A Unspecified open wound, unspecified knee, initial encounter: Secondary | ICD-10-CM | POA: Diagnosis not present

## 2014-01-20 DIAGNOSIS — S91009A Unspecified open wound, unspecified ankle, initial encounter: Secondary | ICD-10-CM | POA: Diagnosis not present

## 2014-01-20 DIAGNOSIS — I2510 Atherosclerotic heart disease of native coronary artery without angina pectoris: Secondary | ICD-10-CM | POA: Diagnosis not present

## 2014-01-20 DIAGNOSIS — M25559 Pain in unspecified hip: Secondary | ICD-10-CM | POA: Diagnosis not present

## 2014-01-20 DIAGNOSIS — I4891 Unspecified atrial fibrillation: Secondary | ICD-10-CM | POA: Diagnosis not present

## 2014-01-20 DIAGNOSIS — I10 Essential (primary) hypertension: Secondary | ICD-10-CM | POA: Diagnosis not present

## 2014-01-21 DIAGNOSIS — M810 Age-related osteoporosis without current pathological fracture: Secondary | ICD-10-CM | POA: Diagnosis not present

## 2014-01-22 DIAGNOSIS — S81009A Unspecified open wound, unspecified knee, initial encounter: Secondary | ICD-10-CM | POA: Diagnosis not present

## 2014-01-22 DIAGNOSIS — M25559 Pain in unspecified hip: Secondary | ICD-10-CM | POA: Diagnosis not present

## 2014-01-22 DIAGNOSIS — I4891 Unspecified atrial fibrillation: Secondary | ICD-10-CM | POA: Diagnosis not present

## 2014-01-22 DIAGNOSIS — I10 Essential (primary) hypertension: Secondary | ICD-10-CM | POA: Diagnosis not present

## 2014-01-22 DIAGNOSIS — S72009D Fracture of unspecified part of neck of unspecified femur, subsequent encounter for closed fracture with routine healing: Secondary | ICD-10-CM | POA: Diagnosis not present

## 2014-01-22 DIAGNOSIS — I2510 Atherosclerotic heart disease of native coronary artery without angina pectoris: Secondary | ICD-10-CM | POA: Diagnosis not present

## 2014-01-24 ENCOUNTER — Ambulatory Visit (INDEPENDENT_AMBULATORY_CARE_PROVIDER_SITE_OTHER): Payer: Medicare Other | Admitting: Cardiology

## 2014-01-24 ENCOUNTER — Ambulatory Visit (INDEPENDENT_AMBULATORY_CARE_PROVIDER_SITE_OTHER): Payer: Medicare Other | Admitting: Internal Medicine

## 2014-01-24 ENCOUNTER — Ambulatory Visit (INDEPENDENT_AMBULATORY_CARE_PROVIDER_SITE_OTHER)
Admission: RE | Admit: 2014-01-24 | Discharge: 2014-01-24 | Disposition: A | Discharge status: Home / Self Care | Payer: Medicare Other | Source: Ambulatory Visit | Attending: Internal Medicine | Admitting: Internal Medicine

## 2014-01-24 ENCOUNTER — Encounter: Payer: Self-pay | Admitting: Internal Medicine

## 2014-01-24 VITALS — BP 120/68 | HR 88 | Temp 97.5°F | Resp 16 | Ht 69.0 in | Wt 170.5 lb

## 2014-01-24 DIAGNOSIS — S43016A Anterior dislocation of unspecified humerus, initial encounter: Secondary | ICD-10-CM | POA: Diagnosis not present

## 2014-01-24 DIAGNOSIS — S81009A Unspecified open wound, unspecified knee, initial encounter: Secondary | ICD-10-CM | POA: Diagnosis not present

## 2014-01-24 DIAGNOSIS — Z5181 Encounter for therapeutic drug level monitoring: Secondary | ICD-10-CM

## 2014-01-24 DIAGNOSIS — S4992XA Unspecified injury of left shoulder and upper arm, initial encounter: Secondary | ICD-10-CM

## 2014-01-24 DIAGNOSIS — M25559 Pain in unspecified hip: Secondary | ICD-10-CM | POA: Diagnosis not present

## 2014-01-24 DIAGNOSIS — S43036A Inferior dislocation of unspecified humerus, initial encounter: Secondary | ICD-10-CM | POA: Diagnosis not present

## 2014-01-24 DIAGNOSIS — S46909A Unspecified injury of unspecified muscle, fascia and tendon at shoulder and upper arm level, unspecified arm, initial encounter: Secondary | ICD-10-CM

## 2014-01-24 DIAGNOSIS — I10 Essential (primary) hypertension: Secondary | ICD-10-CM | POA: Diagnosis not present

## 2014-01-24 DIAGNOSIS — S4980XA Other specified injuries of shoulder and upper arm, unspecified arm, initial encounter: Secondary | ICD-10-CM | POA: Diagnosis not present

## 2014-01-24 DIAGNOSIS — I4891 Unspecified atrial fibrillation: Secondary | ICD-10-CM | POA: Diagnosis not present

## 2014-01-24 DIAGNOSIS — S72009D Fracture of unspecified part of neck of unspecified femur, subsequent encounter for closed fracture with routine healing: Secondary | ICD-10-CM | POA: Diagnosis not present

## 2014-01-24 DIAGNOSIS — I2510 Atherosclerotic heart disease of native coronary artery without angina pectoris: Secondary | ICD-10-CM | POA: Diagnosis not present

## 2014-01-24 DIAGNOSIS — S91009A Unspecified open wound, unspecified ankle, initial encounter: Secondary | ICD-10-CM | POA: Diagnosis not present

## 2014-01-24 LAB — POCT INR: INR: 1.5

## 2014-01-24 NOTE — Assessment & Plan Note (Signed)
He has good rate control and fluid status is ok

## 2014-01-24 NOTE — Patient Instructions (Signed)
   Shoulder Pain The shoulder is the joint that connects your arms to your body. The bones that form the shoulder joint include the upper arm bone (humerus), the shoulder blade (scapula), and the collarbone (clavicle). The top of the humerus is shaped like a ball and fits into a rather flat socket on the scapula (glenoid cavity). A combination of muscles and strong, fibrous tissues that connect muscles to bones (tendons) support your shoulder joint and hold the ball in the socket. Small, fluid-filled sacs (bursae) are located in different areas of the joint. They act as cushions between the bones and the overlying soft tissues and help reduce friction between the gliding tendons and the bone as you move your arm. Your shoulder joint allows a wide range of motion in your arm. This range of motion allows you to do things like scratch your back or throw a ball. However, this range of motion also makes your shoulder more prone to pain from overuse and injury. Causes of shoulder pain can originate from both injury and overuse and usually can be grouped in the following four categories:  Redness, swelling, and pain (inflammation) of the tendon (tendinitis) or the bursae (bursitis).  Instability, such as a dislocation of the joint.  Inflammation of the joint (arthritis).  Broken bone (fracture). HOME CARE INSTRUCTIONS   Apply ice to the sore area.  Put ice in a plastic bag.  Place a towel between your skin and the bag.  Leave the ice on for 15-20 minutes, 03-04 times per day for the first 2 days.  Stop using cold packs if they do not help with the pain.  If you have a shoulder sling or immobilizer, wear it as long as your caregiver instructs. Only remove it to shower or bathe. Move your arm as little as possible, but keep your hand moving to prevent swelling.  Squeeze a soft ball or foam pad as much as possible to help prevent swelling.  Only take over-the-counter or prescription medicines for  pain, discomfort, or fever as directed by your caregiver. SEEK MEDICAL CARE IF:   Your shoulder pain increases, or new pain develops in your arm, hand, or fingers.  Your hand or fingers become cold and numb.  Your pain is not relieved with medicines. SEEK IMMEDIATE MEDICAL CARE IF:   Your arm, hand, or fingers are numb or tingling.  Your arm, hand, or fingers are significantly swollen or turn white or blue. MAKE SURE YOU:   Understand these instructions.  Will watch your condition.  Will get help right away if you are not doing well or get worse. Document Released: 09/15/2005 Document Revised: 08/30/2012 Document Reviewed: 11/20/2011 [employer] Patient Information 2014 [employer].  

## 2014-01-24 NOTE — Progress Notes (Signed)
   Subjective:    Patient ID: Dwayne [name] FactorLarry E Gaglio, male    DOB: Aug 12, 1932, 80 y.o.   MRN: [id number]  Shoulder Injury  The incident occurred at home. The left shoulder is affected. The incident occurred more than 1 week ago. The injury mechanism was a fall. The quality of the pain is described as aching. The pain does not radiate. The pain is at a severity of 2/10. The pain is mild. Pertinent negatives include no chest pain, muscle weakness, numbness or tingling. The symptoms are aggravated by movement. He has tried acetaminophen for the symptoms. The treatment provided mild relief.      Review of Systems  Constitutional: Negative.  Negative for fever, chills, diaphoresis, appetite change and fatigue.  HENT: Negative.   Eyes: Negative.   Respiratory: Negative.  Negative for cough, choking, chest tightness, shortness of breath, wheezing and stridor.   Cardiovascular: Positive for leg swelling. Negative for chest pain and palpitations.  Gastrointestinal: Negative.  Negative for abdominal pain.  Endocrine: Negative.   Genitourinary: Negative.   Musculoskeletal: Positive for arthralgias (shoulders and knees). Negative for back pain, myalgias and neck stiffness.  Allergic/Immunologic: Negative.   Neurological: Negative.  Negative for tingling and numbness.  Hematological: Negative.  Negative for adenopathy.  Psychiatric/Behavioral: Negative.        Objective:   Physical Exam  Vitals reviewed. Constitutional: He is oriented to person, place, and time. He appears well-developed and well-nourished. No distress.  HENT:  Head: Normocephalic and atraumatic.  Mouth/Throat: Oropharynx is clear and moist. No oropharyngeal exudate.  Eyes: Conjunctivae are normal. Right eye exhibits no discharge. Left eye exhibits no discharge. No scleral icterus.  Neck: Normal range of motion. Neck supple. No JVD present. No tracheal deviation present. No thyromegaly present.  Cardiovascular: Normal rate, normal heart  sounds and intact distal pulses.  An irregularly irregular rhythm present. Exam reveals no gallop and no friction rub.   No murmur heard. Pulmonary/Chest: Effort normal and breath sounds normal. No stridor. No respiratory distress. He has no wheezes. He has no rales. He exhibits no tenderness.  Abdominal: Soft. Bowel sounds are normal. He exhibits no distension and no mass. There is no tenderness. There is no rebound and no guarding.  Musculoskeletal: He exhibits edema (1+ pitting edema in BLE). He exhibits no tenderness.       Left shoulder: He exhibits decreased range of motion. He exhibits no bony tenderness, no swelling, no effusion, no crepitus, no deformity, no laceration, no pain, no spasm, normal pulse and normal strength.  Lymphadenopathy:    He has no cervical adenopathy.  Neurological: He is oriented to person, place, and time.  Skin: Skin is warm and dry. No rash noted. He is not diaphoretic. No erythema. No pallor.     Lab Results  Component Value Date   WBC 13.1* 12/12/2013   HGB 10.8* 12/12/2013   HCT 33.3* 12/12/2013   PLT 245 12/12/2013   GLUCOSE 110* 12/11/2013   CHOL 137 06/20/2013   TRIG 104.0 06/20/2013   HDL 48.50 06/20/2013   LDLCALC 68 06/20/2013   ALT 20 12/07/2013   AST 28 12/07/2013   NA 139 12/11/2013   K 4.0 12/11/2013   CL 104 12/11/2013   CREATININE 0.84 12/11/2013   BUN 14 12/11/2013   CO2 24 12/11/2013   TSH 0.90 02/22/2012   INR 1.5 01/24/2014   HGBA1C 6.2 03/14/2012       Assessment & Plan:

## 2014-01-24 NOTE — Assessment & Plan Note (Signed)
Plain film is ok, but he may have a rotator cuff tear - he sees Dr. Ophelia CharterYates next week

## 2014-01-24 NOTE — Progress Notes (Signed)
Pre visit review using our clinic review tool, if applicable. No additional management support is needed unless otherwise documented below in the visit note. 

## 2014-01-25 DIAGNOSIS — S91009A Unspecified open wound, unspecified ankle, initial encounter: Secondary | ICD-10-CM | POA: Diagnosis not present

## 2014-01-25 DIAGNOSIS — M25559 Pain in unspecified hip: Secondary | ICD-10-CM | POA: Diagnosis not present

## 2014-01-25 DIAGNOSIS — I4891 Unspecified atrial fibrillation: Secondary | ICD-10-CM | POA: Diagnosis not present

## 2014-01-25 DIAGNOSIS — I10 Essential (primary) hypertension: Secondary | ICD-10-CM | POA: Diagnosis not present

## 2014-01-25 DIAGNOSIS — S81009A Unspecified open wound, unspecified knee, initial encounter: Secondary | ICD-10-CM | POA: Diagnosis not present

## 2014-01-25 DIAGNOSIS — I2510 Atherosclerotic heart disease of native coronary artery without angina pectoris: Secondary | ICD-10-CM | POA: Diagnosis not present

## 2014-01-25 DIAGNOSIS — S72009D Fracture of unspecified part of neck of unspecified femur, subsequent encounter for closed fracture with routine healing: Secondary | ICD-10-CM | POA: Diagnosis not present

## 2014-01-28 ENCOUNTER — Telehealth: Payer: Self-pay | Admitting: *Deleted

## 2014-01-28 DIAGNOSIS — S81009A Unspecified open wound, unspecified knee, initial encounter: Secondary | ICD-10-CM | POA: Diagnosis not present

## 2014-01-28 DIAGNOSIS — M25559 Pain in unspecified hip: Secondary | ICD-10-CM | POA: Diagnosis not present

## 2014-01-28 DIAGNOSIS — I10 Essential (primary) hypertension: Secondary | ICD-10-CM | POA: Diagnosis not present

## 2014-01-28 DIAGNOSIS — I2510 Atherosclerotic heart disease of native coronary artery without angina pectoris: Secondary | ICD-10-CM | POA: Diagnosis not present

## 2014-01-28 DIAGNOSIS — I4891 Unspecified atrial fibrillation: Secondary | ICD-10-CM | POA: Diagnosis not present

## 2014-01-28 DIAGNOSIS — S72009D Fracture of unspecified part of neck of unspecified femur, subsequent encounter for closed fracture with routine healing: Secondary | ICD-10-CM | POA: Diagnosis not present

## 2014-01-28 MED ORDER — FINASTERIDE 5 MG PO TABS: 5.0000 mg | ORAL_TABLET | Freq: Every day | ORAL | Status: DC

## 2014-01-28 MED ORDER — TERAZOSIN HCL 5 MG PO CAPS: 5.0000 mg | ORAL_CAPSULE | Freq: Every day | ORAL | Status: DC

## 2014-01-28 MED ORDER — TRAZODONE HCL 100 MG PO TABS: 100.0000 mg | ORAL_TABLET | Freq: Every day | ORAL | Status: DC

## 2014-01-28 NOTE — Telephone Encounter (Signed)
Spouse phoned requesting refills for patient for hytrin (03/08/13) and proscar (03/08/13-lipid panel 06/20/13) and last OV with PCP 01/24/14.  Refilled per protocol.  Also requesting refill for trazadone (last ordered 01/04/14) that he is also requesting dose be increased from 50 to 100 mg per prior MD/patient conversation.  Also requesting xray results for shoulder xray.  Please advise  CB#[phone]

## 2014-01-28 NOTE — Telephone Encounter (Signed)
The xray showed a lot of arthritis and tendonitis

## 2014-01-28 NOTE — Telephone Encounter (Signed)
Notified spouse of MD response and refill.  Understanding and appreciation verbalized.

## 2014-01-29 ENCOUNTER — Ambulatory Visit (INDEPENDENT_AMBULATORY_CARE_PROVIDER_SITE_OTHER): Payer: Medicare Other | Admitting: Cardiology

## 2014-01-29 DIAGNOSIS — I4891 Unspecified atrial fibrillation: Secondary | ICD-10-CM | POA: Diagnosis not present

## 2014-01-29 DIAGNOSIS — M25559 Pain in unspecified hip: Secondary | ICD-10-CM | POA: Diagnosis not present

## 2014-01-29 DIAGNOSIS — S72009D Fracture of unspecified part of neck of unspecified femur, subsequent encounter for closed fracture with routine healing: Secondary | ICD-10-CM | POA: Diagnosis not present

## 2014-01-29 DIAGNOSIS — Z5181 Encounter for therapeutic drug level monitoring: Secondary | ICD-10-CM

## 2014-01-29 DIAGNOSIS — S81809A Unspecified open wound, unspecified lower leg, initial encounter: Secondary | ICD-10-CM | POA: Diagnosis not present

## 2014-01-29 DIAGNOSIS — I2510 Atherosclerotic heart disease of native coronary artery without angina pectoris: Secondary | ICD-10-CM | POA: Diagnosis not present

## 2014-01-29 DIAGNOSIS — S81009A Unspecified open wound, unspecified knee, initial encounter: Secondary | ICD-10-CM | POA: Diagnosis not present

## 2014-01-29 DIAGNOSIS — I10 Essential (primary) hypertension: Secondary | ICD-10-CM | POA: Diagnosis not present

## 2014-01-29 LAB — POCT INR: INR: 1.8

## 2014-01-30 DIAGNOSIS — I10 Essential (primary) hypertension: Secondary | ICD-10-CM | POA: Diagnosis not present

## 2014-01-30 DIAGNOSIS — S81009A Unspecified open wound, unspecified knee, initial encounter: Secondary | ICD-10-CM | POA: Diagnosis not present

## 2014-01-30 DIAGNOSIS — I4891 Unspecified atrial fibrillation: Secondary | ICD-10-CM | POA: Diagnosis not present

## 2014-01-30 DIAGNOSIS — M25559 Pain in unspecified hip: Secondary | ICD-10-CM | POA: Diagnosis not present

## 2014-01-30 DIAGNOSIS — S72009D Fracture of unspecified part of neck of unspecified femur, subsequent encounter for closed fracture with routine healing: Secondary | ICD-10-CM | POA: Diagnosis not present

## 2014-01-30 DIAGNOSIS — I2510 Atherosclerotic heart disease of native coronary artery without angina pectoris: Secondary | ICD-10-CM | POA: Diagnosis not present

## 2014-01-31 DIAGNOSIS — I10 Essential (primary) hypertension: Secondary | ICD-10-CM | POA: Diagnosis not present

## 2014-01-31 DIAGNOSIS — M25559 Pain in unspecified hip: Secondary | ICD-10-CM | POA: Diagnosis not present

## 2014-01-31 DIAGNOSIS — S81009A Unspecified open wound, unspecified knee, initial encounter: Secondary | ICD-10-CM | POA: Diagnosis not present

## 2014-01-31 DIAGNOSIS — S72009D Fracture of unspecified part of neck of unspecified femur, subsequent encounter for closed fracture with routine healing: Secondary | ICD-10-CM | POA: Diagnosis not present

## 2014-01-31 DIAGNOSIS — I4891 Unspecified atrial fibrillation: Secondary | ICD-10-CM | POA: Diagnosis not present

## 2014-01-31 DIAGNOSIS — I2510 Atherosclerotic heart disease of native coronary artery without angina pectoris: Secondary | ICD-10-CM | POA: Diagnosis not present

## 2014-02-04 DIAGNOSIS — S72009D Fracture of unspecified part of neck of unspecified femur, subsequent encounter for closed fracture with routine healing: Secondary | ICD-10-CM | POA: Diagnosis not present

## 2014-02-04 DIAGNOSIS — I10 Essential (primary) hypertension: Secondary | ICD-10-CM | POA: Diagnosis not present

## 2014-02-04 DIAGNOSIS — I2510 Atherosclerotic heart disease of native coronary artery without angina pectoris: Secondary | ICD-10-CM | POA: Diagnosis not present

## 2014-02-04 DIAGNOSIS — M25559 Pain in unspecified hip: Secondary | ICD-10-CM | POA: Diagnosis not present

## 2014-02-04 DIAGNOSIS — S81009A Unspecified open wound, unspecified knee, initial encounter: Secondary | ICD-10-CM | POA: Diagnosis not present

## 2014-02-04 DIAGNOSIS — I4891 Unspecified atrial fibrillation: Secondary | ICD-10-CM | POA: Diagnosis not present

## 2014-02-06 DIAGNOSIS — I4891 Unspecified atrial fibrillation: Secondary | ICD-10-CM | POA: Diagnosis not present

## 2014-02-06 DIAGNOSIS — M25559 Pain in unspecified hip: Secondary | ICD-10-CM | POA: Diagnosis not present

## 2014-02-06 DIAGNOSIS — I10 Essential (primary) hypertension: Secondary | ICD-10-CM | POA: Diagnosis not present

## 2014-02-06 DIAGNOSIS — S81809A Unspecified open wound, unspecified lower leg, initial encounter: Secondary | ICD-10-CM | POA: Diagnosis not present

## 2014-02-06 DIAGNOSIS — S72009D Fracture of unspecified part of neck of unspecified femur, subsequent encounter for closed fracture with routine healing: Secondary | ICD-10-CM | POA: Diagnosis not present

## 2014-02-06 DIAGNOSIS — I2510 Atherosclerotic heart disease of native coronary artery without angina pectoris: Secondary | ICD-10-CM | POA: Diagnosis not present

## 2014-02-06 DIAGNOSIS — S81009A Unspecified open wound, unspecified knee, initial encounter: Secondary | ICD-10-CM | POA: Diagnosis not present

## 2014-02-07 DIAGNOSIS — M25559 Pain in unspecified hip: Secondary | ICD-10-CM | POA: Diagnosis not present

## 2014-02-07 DIAGNOSIS — I4891 Unspecified atrial fibrillation: Secondary | ICD-10-CM | POA: Diagnosis not present

## 2014-02-07 DIAGNOSIS — S81009A Unspecified open wound, unspecified knee, initial encounter: Secondary | ICD-10-CM | POA: Diagnosis not present

## 2014-02-07 DIAGNOSIS — I10 Essential (primary) hypertension: Secondary | ICD-10-CM | POA: Diagnosis not present

## 2014-02-07 DIAGNOSIS — S72009D Fracture of unspecified part of neck of unspecified femur, subsequent encounter for closed fracture with routine healing: Secondary | ICD-10-CM | POA: Diagnosis not present

## 2014-02-07 DIAGNOSIS — I2510 Atherosclerotic heart disease of native coronary artery without angina pectoris: Secondary | ICD-10-CM | POA: Diagnosis not present

## 2014-02-11 ENCOUNTER — Ambulatory Visit (INDEPENDENT_AMBULATORY_CARE_PROVIDER_SITE_OTHER): Payer: Medicare Other | Admitting: Cardiology

## 2014-02-11 DIAGNOSIS — I4891 Unspecified atrial fibrillation: Secondary | ICD-10-CM | POA: Diagnosis not present

## 2014-02-11 DIAGNOSIS — S72009D Fracture of unspecified part of neck of unspecified femur, subsequent encounter for closed fracture with routine healing: Secondary | ICD-10-CM | POA: Diagnosis not present

## 2014-02-11 DIAGNOSIS — I10 Essential (primary) hypertension: Secondary | ICD-10-CM | POA: Diagnosis not present

## 2014-02-11 DIAGNOSIS — I2510 Atherosclerotic heart disease of native coronary artery without angina pectoris: Secondary | ICD-10-CM | POA: Diagnosis not present

## 2014-02-11 DIAGNOSIS — M25559 Pain in unspecified hip: Secondary | ICD-10-CM | POA: Diagnosis not present

## 2014-02-11 DIAGNOSIS — S81009A Unspecified open wound, unspecified knee, initial encounter: Secondary | ICD-10-CM | POA: Diagnosis not present

## 2014-02-11 DIAGNOSIS — Z5181 Encounter for therapeutic drug level monitoring: Secondary | ICD-10-CM

## 2014-02-11 LAB — POCT INR: INR: 2.6

## 2014-02-12 DIAGNOSIS — I2510 Atherosclerotic heart disease of native coronary artery without angina pectoris: Secondary | ICD-10-CM | POA: Diagnosis not present

## 2014-02-12 DIAGNOSIS — M25559 Pain in unspecified hip: Secondary | ICD-10-CM | POA: Diagnosis not present

## 2014-02-12 DIAGNOSIS — I4891 Unspecified atrial fibrillation: Secondary | ICD-10-CM | POA: Diagnosis not present

## 2014-02-12 DIAGNOSIS — I10 Essential (primary) hypertension: Secondary | ICD-10-CM | POA: Diagnosis not present

## 2014-02-12 DIAGNOSIS — S81009A Unspecified open wound, unspecified knee, initial encounter: Secondary | ICD-10-CM | POA: Diagnosis not present

## 2014-02-12 DIAGNOSIS — S72009D Fracture of unspecified part of neck of unspecified femur, subsequent encounter for closed fracture with routine healing: Secondary | ICD-10-CM | POA: Diagnosis not present

## 2014-02-13 ENCOUNTER — Ambulatory Visit: Payer: Medicare Other | Admitting: Internal Medicine

## 2014-02-13 DIAGNOSIS — I2510 Atherosclerotic heart disease of native coronary artery without angina pectoris: Secondary | ICD-10-CM | POA: Diagnosis not present

## 2014-02-13 DIAGNOSIS — S72009D Fracture of unspecified part of neck of unspecified femur, subsequent encounter for closed fracture with routine healing: Secondary | ICD-10-CM | POA: Diagnosis not present

## 2014-02-13 DIAGNOSIS — M25559 Pain in unspecified hip: Secondary | ICD-10-CM | POA: Diagnosis not present

## 2014-02-13 DIAGNOSIS — I4891 Unspecified atrial fibrillation: Secondary | ICD-10-CM | POA: Diagnosis not present

## 2014-02-13 DIAGNOSIS — I10 Essential (primary) hypertension: Secondary | ICD-10-CM | POA: Diagnosis not present

## 2014-02-13 DIAGNOSIS — S81009A Unspecified open wound, unspecified knee, initial encounter: Secondary | ICD-10-CM | POA: Diagnosis not present

## 2014-02-14 ENCOUNTER — Ambulatory Visit: Payer: Medicare Other | Admitting: Cardiology

## 2014-02-16 ENCOUNTER — Inpatient Hospital Stay (HOSPITAL_COMMUNITY)
Admission: EM | Admit: 2014-02-16 | Discharge: 2014-02-19 | DRG: 378 | Disposition: A | Discharge status: Home / Self Care | Payer: Medicare Other | Attending: Internal Medicine | Admitting: Internal Medicine

## 2014-02-16 ENCOUNTER — Other Ambulatory Visit (HOSPITAL_COMMUNITY): Payer: Medicare Other

## 2014-02-16 ENCOUNTER — Emergency Department (HOSPITAL_COMMUNITY): Payer: Medicare Other

## 2014-02-16 ENCOUNTER — Encounter (HOSPITAL_COMMUNITY): Payer: Self-pay | Admitting: Emergency Medicine

## 2014-02-16 DIAGNOSIS — E785 Hyperlipidemia, unspecified: Secondary | ICD-10-CM

## 2014-02-16 DIAGNOSIS — K922 Gastrointestinal hemorrhage, unspecified: Secondary | ICD-10-CM | POA: Diagnosis not present

## 2014-02-16 DIAGNOSIS — R7309 Other abnormal glucose: Secondary | ICD-10-CM

## 2014-02-16 DIAGNOSIS — I2510 Atherosclerotic heart disease of native coronary artery without angina pectoris: Secondary | ICD-10-CM | POA: Diagnosis present

## 2014-02-16 DIAGNOSIS — I10 Essential (primary) hypertension: Secondary | ICD-10-CM | POA: Diagnosis not present

## 2014-02-16 DIAGNOSIS — I252 Old myocardial infarction: Secondary | ICD-10-CM | POA: Diagnosis not present

## 2014-02-16 DIAGNOSIS — I340 Nonrheumatic mitral (valve) insufficiency: Secondary | ICD-10-CM

## 2014-02-16 DIAGNOSIS — D689 Coagulation defect, unspecified: Secondary | ICD-10-CM | POA: Diagnosis not present

## 2014-02-16 DIAGNOSIS — Z7982 Long term (current) use of aspirin: Secondary | ICD-10-CM | POA: Diagnosis not present

## 2014-02-16 DIAGNOSIS — E870 Hyperosmolality and hypernatremia: Secondary | ICD-10-CM | POA: Diagnosis present

## 2014-02-16 DIAGNOSIS — I4891 Unspecified atrial fibrillation: Secondary | ICD-10-CM | POA: Diagnosis not present

## 2014-02-16 DIAGNOSIS — Z881 Allergy status to other antibiotic agents status: Secondary | ICD-10-CM

## 2014-02-16 DIAGNOSIS — Z882 Allergy status to sulfonamides status: Secondary | ICD-10-CM | POA: Diagnosis not present

## 2014-02-16 DIAGNOSIS — Z79899 Other long term (current) drug therapy: Secondary | ICD-10-CM

## 2014-02-16 DIAGNOSIS — K5900 Constipation, unspecified: Secondary | ICD-10-CM | POA: Diagnosis present

## 2014-02-16 DIAGNOSIS — S4992XA Unspecified injury of left shoulder and upper arm, initial encounter: Secondary | ICD-10-CM

## 2014-02-16 DIAGNOSIS — I959 Hypotension, unspecified: Secondary | ICD-10-CM | POA: Diagnosis present

## 2014-02-16 DIAGNOSIS — Z96649 Presence of unspecified artificial hip joint: Secondary | ICD-10-CM

## 2014-02-16 DIAGNOSIS — Z9089 Acquired absence of other organs: Secondary | ICD-10-CM

## 2014-02-16 DIAGNOSIS — IMO0002 Reserved for concepts with insufficient information to code with codable children: Secondary | ICD-10-CM

## 2014-02-16 DIAGNOSIS — K219 Gastro-esophageal reflux disease without esophagitis: Secondary | ICD-10-CM | POA: Diagnosis present

## 2014-02-16 DIAGNOSIS — Z950 Presence of cardiac pacemaker: Secondary | ICD-10-CM | POA: Diagnosis present

## 2014-02-16 DIAGNOSIS — Z951 Presence of aortocoronary bypass graft: Secondary | ICD-10-CM

## 2014-02-16 DIAGNOSIS — M810 Age-related osteoporosis without current pathological fracture: Secondary | ICD-10-CM

## 2014-02-16 DIAGNOSIS — J309 Allergic rhinitis, unspecified: Secondary | ICD-10-CM

## 2014-02-16 DIAGNOSIS — K5731 Diverticulosis of large intestine without perforation or abscess with bleeding: Principal | ICD-10-CM

## 2014-02-16 DIAGNOSIS — Z823 Family history of stroke: Secondary | ICD-10-CM

## 2014-02-16 DIAGNOSIS — J479 Bronchiectasis, uncomplicated: Secondary | ICD-10-CM

## 2014-02-16 DIAGNOSIS — Z961 Presence of intraocular lens: Secondary | ICD-10-CM | POA: Diagnosis not present

## 2014-02-16 DIAGNOSIS — F0390 Unspecified dementia without behavioral disturbance: Secondary | ICD-10-CM | POA: Diagnosis present

## 2014-02-16 DIAGNOSIS — D518 Other vitamin B12 deficiency anemias: Secondary | ICD-10-CM

## 2014-02-16 DIAGNOSIS — N400 Enlarged prostate without lower urinary tract symptoms: Secondary | ICD-10-CM

## 2014-02-16 DIAGNOSIS — K625 Hemorrhage of anus and rectum: Secondary | ICD-10-CM | POA: Diagnosis not present

## 2014-02-16 DIAGNOSIS — M722 Plantar fascial fibromatosis: Secondary | ICD-10-CM | POA: Diagnosis present

## 2014-02-16 DIAGNOSIS — I495 Sick sinus syndrome: Secondary | ICD-10-CM | POA: Diagnosis present

## 2014-02-16 DIAGNOSIS — D72829 Elevated white blood cell count, unspecified: Secondary | ICD-10-CM

## 2014-02-16 DIAGNOSIS — M4800 Spinal stenosis, site unspecified: Secondary | ICD-10-CM

## 2014-02-16 DIAGNOSIS — Z9849 Cataract extraction status, unspecified eye: Secondary | ICD-10-CM

## 2014-02-16 DIAGNOSIS — Z7901 Long term (current) use of anticoagulants: Secondary | ICD-10-CM

## 2014-02-16 DIAGNOSIS — K5909 Other constipation: Secondary | ICD-10-CM | POA: Diagnosis present

## 2014-02-16 DIAGNOSIS — K5733 Diverticulitis of large intestine without perforation or abscess with bleeding: Secondary | ICD-10-CM | POA: Diagnosis not present

## 2014-02-16 DIAGNOSIS — Z888 Allergy status to other drugs, medicaments and biological substances status: Secondary | ICD-10-CM

## 2014-02-16 DIAGNOSIS — Z5181 Encounter for therapeutic drug level monitoring: Secondary | ICD-10-CM

## 2014-02-16 DIAGNOSIS — D62 Acute posthemorrhagic anemia: Secondary | ICD-10-CM | POA: Diagnosis not present

## 2014-02-16 DIAGNOSIS — Z880 Allergy status to penicillin: Secondary | ICD-10-CM

## 2014-02-16 DIAGNOSIS — D500 Iron deficiency anemia secondary to blood loss (chronic): Secondary | ICD-10-CM | POA: Diagnosis not present

## 2014-02-16 DIAGNOSIS — Z9861 Coronary angioplasty status: Secondary | ICD-10-CM

## 2014-02-16 DIAGNOSIS — I25810 Atherosclerosis of coronary artery bypass graft(s) without angina pectoris: Secondary | ICD-10-CM | POA: Diagnosis present

## 2014-02-16 DIAGNOSIS — K921 Melena: Secondary | ICD-10-CM | POA: Diagnosis not present

## 2014-02-16 LAB — COMPREHENSIVE METABOLIC PANEL
ALT: 9 U/L (ref 0–53)
AST: 13 U/L (ref 0–37)
Albumin: 3 g/dL — ABNORMAL LOW (ref 3.5–5.2)
Alkaline Phosphatase: 70 U/L (ref 39–117)
BILIRUBIN TOTAL: 0.3 mg/dL (ref 0.3–1.2)
BUN: 17 mg/dL (ref 6–23)
CHLORIDE: 108 meq/L (ref 96–112)
CO2: 24 mEq/L (ref 19–32)
Calcium: 8.3 mg/dL — ABNORMAL LOW (ref 8.4–10.5)
Creatinine, Ser: 0.75 mg/dL (ref 0.50–1.35)
GFR calc Af Amer: >90 mL/min (ref >90)
GFR calc non Af Amer: 85 mL/min — ABNORMAL LOW (ref >90)
Glucose, Bld: 112 mg/dL — ABNORMAL HIGH (ref 70–99)
POTASSIUM: 4.2 meq/L (ref 3.7–5.3)
Sodium: 141 mEq/L (ref 137–147)
Total Protein: 6.3 g/dL (ref 6.0–8.3)

## 2014-02-16 LAB — CBC
HCT: 23 % — ABNORMAL LOW (ref 39.0–52.0)
Hemoglobin: 7.6 g/dL — ABNORMAL LOW (ref 13.0–17.0)
MCH: 29.9 pg (ref 26.0–34.0)
MCHC: 33 g/dL (ref 30.0–36.0)
MCV: 90.6 fL (ref 78.0–100.0)
PLATELETS: 183 10*3/uL (ref 150–400)
RBC: 2.54 MIL/uL — ABNORMAL LOW (ref 4.22–5.81)
RDW: 15.3 % (ref 11.5–15.5)
WBC: 7.4 10*3/uL (ref 4.0–10.5)

## 2014-02-16 LAB — CBC WITH DIFFERENTIAL/PLATELET
BASOS ABS: 0.1 10*3/uL (ref 0.0–0.1)
BASOS PCT: 1 % (ref 0–1)
Eosinophils Absolute: 0.4 10*3/uL (ref 0.0–0.7)
Eosinophils Relative: 5 % (ref 0–5)
HCT: 28.7 % — ABNORMAL LOW (ref 39.0–52.0)
HEMOGLOBIN: 9.3 g/dL — AB (ref 13.0–17.0)
Lymphocytes Relative: 18 % (ref 12–46)
Lymphs Abs: 1.4 10*3/uL (ref 0.7–4.0)
MCH: 29.4 pg (ref 26.0–34.0)
MCHC: 32.4 g/dL (ref 30.0–36.0)
MCV: 90.8 fL (ref 78.0–100.0)
MONOS PCT: 10 % (ref 3–12)
Monocytes Absolute: 0.8 10*3/uL (ref 0.1–1.0)
NEUTROS ABS: 5.4 10*3/uL (ref 1.7–7.7)
NEUTROS PCT: 66 % (ref 43–77)
Platelets: 230 10*3/uL (ref 150–400)
RBC: 3.16 MIL/uL — ABNORMAL LOW (ref 4.22–5.81)
RDW: 15.4 % (ref 11.5–15.5)
WBC: 8.1 10*3/uL (ref 4.0–10.5)

## 2014-02-16 LAB — POC OCCULT BLOOD, ED: Fecal Occult Bld: POSITIVE — AB

## 2014-02-16 LAB — PROTIME-INR
INR: 2.89 — AB (ref 0.00–1.49)
Prothrombin Time: 29.2 seconds — ABNORMAL HIGH (ref 11.6–15.2)

## 2014-02-16 LAB — I-STAT CHEM 8, ED
BUN: 18 mg/dL (ref 6–23)
CHLORIDE: 107 meq/L (ref 96–112)
Calcium, Ion: 1.17 mmol/L (ref 1.13–1.30)
Creatinine, Ser: 0.9 mg/dL (ref 0.50–1.35)
Glucose, Bld: 111 mg/dL — ABNORMAL HIGH (ref 70–99)
HEMATOCRIT: 29 % — AB (ref 39.0–52.0)
Hemoglobin: 9.9 g/dL — ABNORMAL LOW (ref 13.0–17.0)
POTASSIUM: 4 meq/L (ref 3.7–5.3)
Sodium: 143 mEq/L (ref 137–147)
TCO2: 24 mmol/L (ref 0–100)

## 2014-02-16 LAB — PREPARE RBC (CROSSMATCH)

## 2014-02-16 LAB — MRSA PCR SCREENING: MRSA by PCR: NEGATIVE

## 2014-02-16 MED ORDER — ONDANSETRON HCL 4 MG PO TABS: 4.0000 mg | ORAL_TABLET | Freq: Four times a day (QID) | ORAL | Status: DC | PRN

## 2014-02-16 MED ORDER — DIPHENHYDRAMINE HCL 50 MG/ML IJ SOLN
25.0000 mg | Freq: Once | INTRAMUSCULAR | Status: AC
  Administered 2014-02-16: 25 mg via INTRAVENOUS
  Filled 2014-02-16: qty 1

## 2014-02-16 MED ORDER — SODIUM CHLORIDE 0.9 % IV SOLN
INTRAVENOUS | Status: DC
  Administered 2014-02-16 – 2014-02-17 (×3): via INTRAVENOUS

## 2014-02-16 MED ORDER — ONDANSETRON HCL 4 MG/2ML IJ SOLN: 4.0000 mg | Freq: Four times a day (QID) | INTRAMUSCULAR | Status: DC | PRN

## 2014-02-16 MED ORDER — SODIUM CHLORIDE 0.9 % IJ SOLN
3.0000 mL | Freq: Two times a day (BID) | INTRAMUSCULAR | Status: DC
  Administered 2014-02-16 – 2014-02-19 (×6): 3 mL via INTRAVENOUS

## 2014-02-16 MED ORDER — MORPHINE SULFATE 2 MG/ML IJ SOLN
1.0000 mg | INTRAMUSCULAR | Status: DC | PRN
  Administered 2014-02-17: 1 mg via INTRAVENOUS
  Filled 2014-02-16: qty 1

## 2014-02-16 MED ORDER — ACETAMINOPHEN 325 MG PO TABS
650.0000 mg | ORAL_TABLET | Freq: Once | ORAL | Status: AC
  Administered 2014-02-16: 650 mg via ORAL
  Filled 2014-02-16: qty 2

## 2014-02-16 MED ORDER — SODIUM CHLORIDE 0.9 % IV BOLUS (SEPSIS)
1000.0000 mL | Freq: Once | INTRAVENOUS | Status: AC
  Administered 2014-02-16: 1000 mL via INTRAVENOUS

## 2014-02-16 MED ORDER — FUROSEMIDE 10 MG/ML IJ SOLN
20.0000 mg | INTRAMUSCULAR | Status: AC
  Administered 2014-02-16 – 2014-02-17 (×2): 20 mg via INTRAVENOUS
  Filled 2014-02-16 (×2): qty 2

## 2014-02-16 MED ORDER — IOHEXOL 300 MG/ML  SOLN
25.0000 mL | INTRAMUSCULAR | Status: AC | PRN
  Administered 2014-02-16 (×2): 25 mL via ORAL

## 2014-02-16 MED ORDER — ALBUTEROL SULFATE (2.5 MG/3ML) 0.083% IN NEBU: 2.5000 mg | INHALATION_SOLUTION | RESPIRATORY_TRACT | Status: DC | PRN

## 2014-02-16 MED ORDER — ACETAMINOPHEN 325 MG PO TABS: 650.0000 mg | ORAL_TABLET | Freq: Four times a day (QID) | ORAL | Status: DC | PRN

## 2014-02-16 MED ORDER — PANTOPRAZOLE SODIUM 40 MG PO TBEC
40.0000 mg | DELAYED_RELEASE_TABLET | Freq: Every day | ORAL | Status: DC
  Administered 2014-02-16 – 2014-02-17 (×2): 40 mg via ORAL
  Filled 2014-02-16 (×2): qty 1

## 2014-02-16 MED ORDER — PHYTONADIONE 5 MG PO TABS
5.0000 mg | ORAL_TABLET | Freq: Once | ORAL | Status: AC
  Administered 2014-02-16: 5 mg via ORAL
  Filled 2014-02-16: qty 1

## 2014-02-16 MED ORDER — NITROGLYCERIN 0.4 MG SL SUBL: 0.4000 mg | SUBLINGUAL_TABLET | SUBLINGUAL | Status: DC | PRN

## 2014-02-16 MED ORDER — IOHEXOL 300 MG/ML  SOLN
100.0000 mL | Freq: Once | INTRAMUSCULAR | Status: AC | PRN
  Administered 2014-02-16: 100 mL via INTRAVENOUS

## 2014-02-16 MED ORDER — ACETAMINOPHEN 650 MG RE SUPP: 650.0000 mg | Freq: Four times a day (QID) | RECTAL | Status: DC | PRN

## 2014-02-16 MED ORDER — FUROSEMIDE 10 MG/ML IJ SOLN
20.0000 mg | Freq: Once | INTRAMUSCULAR | Status: AC
  Administered 2014-02-16: 20 mg via INTRAVENOUS
  Filled 2014-02-16: qty 2

## 2014-02-16 MED ORDER — GUAIFENESIN-DM 100-10 MG/5ML PO SYRP
5.0000 mL | ORAL_SOLUTION | ORAL | Status: DC | PRN
  Filled 2014-02-16: qty 5

## 2014-02-16 MED ORDER — TRAZODONE HCL 100 MG PO TABS
100.0000 mg | ORAL_TABLET | Freq: Every day | ORAL | Status: DC
  Administered 2014-02-16 – 2014-02-18 (×3): 100 mg via ORAL
  Filled 2014-02-16 (×5): qty 1

## 2014-02-16 NOTE — H&P (Addendum)
PATIENT DETAILS Name: Dwayne [name] Age: 80 y.o. Sex: male Date of Birth: 1932-07-03 Admit Date: 02/16/2014 [id number] Yetta Barre, MD   CHIEF COMPLAINT:  Bright red blood per rectum for the past 2-3 days  HPI: Dwayne [name] is a 80 y.o. male with a Past Medical History of atrial fibrillation on chronic Coumadin therapy, coronary artery disease status post CABG on aspirin, hypertension who presents today with the above noted complaint. The patient, he started noticing bright red blood per rectum the day before yesterday. Patient claims that yesterday had numerous bloody bowel movements-approximately 3-4 times a day. He thought that these bloody bowel movements would resolve on their own, however it continued overnight and he continued to have multiple episodes this morning. As a result patient presented to the emergency room for further evaluation . In the emergency room he did complain of some mild abdominal pain, a CT scan of the abdomen was obtained which showed a moderate stool burden, and diverticulosis. While in the patient was also noted to have a very large bowel movement with blood , he also became transiently hypotensive and required IV fluid resuscitationin the emergency room. I was subsequently asked to admit this patient for further evaluation and treatment.  To my evaluation, patient was noted in any distress. He seems to be hemodynamically stable. He just had a very large bloody bowel movement, per RN,- half of the commode which is filled with blood.  Patient denies any headache, fever, hematemesis, nausea, vomiting. He currently denies any abdominal pain.    ALLERGIES:   Allergies  Allergen Reactions  . Ambien [Zolpidem Tartrate] Other (See Comments)    unknown  . Asparaginase Derivatives Other (See Comments)    Makes jittery & causes vision problems, headaches  . Caffeine Other (See Comments)    Interrupts sleep  . Clindamycin     REACTION: upset stomach  . Doxycycline  Hyclate     REACTION: n/v/d  . Erythromycin     REACTION: n/v/d  . Lactose Intolerance (Gi) Other (See Comments)    unknown  . Lyrica [Pregabalin] Other (See Comments)    unknown  . Metronidazole     REACTION: rash, itching  . Nitrostat [Nitroglycerin] Diarrhea  . Penicillins     REACTION: hives  . Sulfonamide Derivatives     REACTION: hives  . Tetracycline     REACTION: n/v/d    PAST MEDICAL HISTORY: Past Medical History  Diagnosis Date  . Atrial fibrillation     on chronic coumadin  . Hyperlipidemia   . Bronchiectasis without acute exacerbation   . Disorders of diaphragm   . Plantar fascial fibromatosis   . Epistaxis   . Unspecified sinusitis (chronic)   . Allergic rhinitis, cause unspecified   . Spinal stenosis, unspecified region other than cervical   . Hypertension   . Coronary artery disease     s/p CABG 1993, s/p PCI of SVG to OM '03  . Tachy-brady syndrome     s/p Medtronic PPM '07  . Myocardial infarction   . Pneumonia     " multiple times "  . Shortness of breath   . GERD (gastroesophageal reflux disease)   . Arthritis     PAST SURGICAL HISTORY: Past Surgical History  Procedure Laterality Date  . Coronary artery bypass graft      LIMA to LAD, SVG to OM, SVG to left circumflex, SVG to PD/PLSA  . Pacemaker insertion  2007, Medtronic dual-chamber  . Kidney cyst removal    . Doppler echocardiography  2011  . Cholecystectomy    . Vertebroplasty    . Cataract extraction w/ intraocular lens  implant, bilateral    . Hip arthroplasty Right 12/10/2013    Procedure: Right Hip Cemented Hemiarthroplasty;  Surgeon: Eldred MangesMark C Yates, MD;  Location: [facility] OR;  Service: Orthopedics;  Laterality: Right;  Right Hip Cemented Hemiarthroplasty    MEDICATIONS AT HOME: Prior to Admission medications   Medication Sig Start Date End Date Taking? Authorizing Provider  aspirin EC 81 MG tablet Take 81 mg by mouth daily.   Yes Historical Provider, MD    Calcium-Magnesium-Vitamin D (CITRACAL CALCIUM+D) 600-40-500 MG-MG-UNIT TB24 Take 1 tablet by mouth 2 (two) times daily.    Yes Historical Provider, MD  CRESTOR 10 MG tablet Take 1 tablet by mouth  daily 06/21/13  Yes Etta Grandchildhomas L Jones, MD  fexofenadine (ALLEGRA) 180 MG tablet Take 180 mg by mouth at bedtime as needed (for allergies).    Yes Historical Provider, MD  finasteride (PROSCAR) 5 MG tablet Take 1 tablet (5 mg total) by mouth at bedtime. 01/28/14  Yes Etta Grandchildhomas L Jones, MD  metoprolol tartrate (LOPRESSOR) 25 MG tablet Take 1 tablet (25 mg total) by mouth 2 (two) times daily. 06/26/13  Yes Hillis RangeJames Allred, MD  Multiple Vitamins-Minerals (OCUVITE-LUTEIN PO) Take 1 tablet by mouth daily.    Yes Historical Provider, MD  Nutritional Supplements (RA MELATONIN/B-6 PO) Take 6 mg by mouth at bedtime.   Yes Historical Provider, MD  omeprazole (PRILOSEC) 40 MG capsule Take 40 mg by mouth daily.     Yes Historical Provider, MD  polyethylene glycol (MIRALAX / GLYCOLAX) packet Take 17 g by mouth daily.   Yes Historical Provider, MD  PROLIA 60 MG/ML SOLN injection Inject 60 mg into the skin every 6 (six) months.  10/04/12  Yes Historical Provider, MD  terazosin (HYTRIN) 5 MG capsule Take 1 capsule (5 mg total) by mouth daily. 01/28/14  Yes Etta Grandchildhomas L Jones, MD  traZODone (DESYREL) 100 MG tablet Take 1 tablet (100 mg total) by mouth at bedtime. 01/28/14  Yes Etta Grandchildhomas L Jones, MD  warfarin (COUMADIN) 1 MG tablet Take 0.5-1 mg by mouth daily. 0.5 tab on Mondays, Wed Fridays; 1 tab daily on Tuesday, Thursday, Saturday, Sunday   Yes Historical Provider, MD  nitroGLYCERIN (NITROSTAT) 0.4 MG SL tablet Place 0.4 mg under the tongue every 5 (five) minutes x 3 doses as needed for chest pain.    Historical Provider, MD    FAMILY HISTORY: Family History  Problem Relation Age of Onset  . Cancer Neg Hx   . Heart disease Neg Hx   . Stroke Father     SOCIAL HISTORY:  reports that he has never smoked. He has never used smokeless  tobacco. He reports that he does not drink alcohol or use illicit drugs.  REVIEW OF SYSTEMS:  Constitutional:   No  weight loss, night sweats,  Fevers, chills, fatigue.  HEENT:    No headaches, Difficulty swallowing,Tooth/dental problems,Sore throat,  No sneezing, itching, ear ache, nasal congestion, post nasal drip,   Cardio-vascular: No chest pain,  Orthopnea, PND, swelling in lower extremities, anasarca, dizziness, palpitations  GI:  No heartburn, indigestion,nausea, vomiting, diarrhea,  loss of appetite  Resp: No shortness of breath with exertion or at rest.  No excess mucus, no productive cough, No non-productive cough,  No coughing up of blood.No change in color of mucus.No wheezing.No chest  wall deformity  Skin:  no rash or lesions.  GU:  no dysuria, change in color of urine, no urgency or frequency.  No flank pain.  Musculoskeletal: No joint pain or swelling.  No decreased range of motion.  No back pain.  Psych: No change in mood or affect. No depression or anxiety.  No memory loss.   PHYSICAL EXAM: Blood pressure 103/54, pulse 65, temperature 97.6 F (36.4 C), resp. rate 21, height 5\' 10"  (1.778 m), weight 74.844 kg (165 lb), SpO2 98.00%.  General appearance :Awake, alert, not in any distress. Speech Clear. Not toxic Looking. Looks pale.  HEENT: Atraumatic and Normocephalic, pupils equally reactive to light and accomodation Neck: supple, no JVD. No cervical lymphadenopathy.  Chest:Good air entry bilaterally, no added sounds  CVS: S1 S2 regular, no murmurs.  Abdomen: Bowel sounds present, Non tender and not distended with no gaurding, rigidity or rebound. Extremities: B/L Lower Ext shows no edema, both legs are warm to touch Neurology: Awake alert, and oriented X 3,  Non focal Skin:No Rash Wounds:N/A  LABS ON ADMISSION:   Recent Labs  02/16/14 0919 02/16/14 1024  NA 141 143  K 4.2 4.0  CL 108 107  CO2 24  --   GLUCOSE 112* 111*  BUN 17 18  CREATININE  0.75 0.90  CALCIUM 8.3*  --     Recent Labs  02/16/14 0919  AST 13  ALT 9  ALKPHOS 70  BILITOT 0.3  PROT 6.3  ALBUMIN 3.0*   No results found for this basename: LIPASE, AMYLASE,  in the last 72 hours  Recent Labs  02/16/14 0919 02/16/14 1024  WBC 8.1  --   NEUTROABS 5.4  --   HGB 9.3* 9.9*  HCT 28.7* 29.0*  MCV 90.8  --   PLT 230  --    No results found for this basename: CKTOTAL, CKMB, CKMBINDEX, TROPONINI,  in the last 72 hours No results found for this basename: DDIMER,  in the last 72 hours No components found with this basename: POCBNP,    RADIOLOGIC STUDIES ON ADMISSION: Ct Abdomen Pelvis W Contrast  02/16/2014   CLINICAL DATA:  Rectal bleeding  EXAM: CT ABDOMEN AND PELVIS WITH CONTRAST  TECHNIQUE: Multidetector CT imaging of the abdomen and pelvis was performed using the standard protocol following bolus administration of intravenous contrast.  CONTRAST:  OMNIPAQUE IOHEXOL 300 MG/ML  SOLN  COMPARISON:  05/20/2010  FINDINGS: Lung bases shows atelectasis or infiltrate in left lower lobe posteriorly. Sagittal images of the spine shows degenerative changes thoracolumbar spine. There is prior vertebroplasty and compression deformity at L4 level.  Enhanced liver is unremarkable. The patient is status postcholecystectomy. Atherosclerotic calcifications of abdominal aorta, bilateral renal artery origin, SMA origin and bilateral iliac arteries are noted. Splenic artery calcifications are noted.  Kidneys are symmetrical in size and enhancement. There is a large cyst in upper pole of the left kidney measures 8 cm. A second cyst in upper pole of the left kidney measures 3.5 cm. No hydronephrosis or hydroureter.  Abundant stool noted in transverse colon. No small bowel obstruction. No aortic aneurysm.  The pancreas, spleen and adrenal glands are unremarkable.  Scattered diverticula are noted in descending colon. No adenopathy. No ascites or free air. Multiple sigmoid colon  diverticula are noted. No definite evidence of acute diverticulitis. There is moderate distension with liquid stool of distal sigmoid and rectosigmoid colon. No significant colonic wall thickening is noted. No definite colonic mass is identified.  The  urinary bladder is unremarkable. Evaluation of the pelvis is limited by streak artifacts from right hip prosthesis.  IMPRESSION: 1. Moderate stool noted in transverse colon. No small bowel or colonic obstruction. Scattered diverticula are noted descending colon. Multiple sigmoid colon diverticula. No definite evidence of acute diverticulitis. 2. Moderate distension of distal sigmoid and rectum with liquid stool. No thickening of colonic wall is identified. 3. No hydronephrosis or hydroureter.  Left renal cysts are noted. 4. Extensive atherosclerotic vascular calcifications. 5. Limited evaluation of the pelvis due to metallic artifacts from right hip prosthesis.   Electronically Signed   By: Natasha Mead M.D.   On: 02/16/2014 12:32   ASSESSMENT AND PLAN: Present on Admission:  . Lower GI bleed - Suspected diverticular bleed.  - Will admit to a step down unit, start IV fluids, monitor hemoglobin and INR closely. We'll transfuse if repeat hemoglobin shows a significant drop. Given persistent hematochezia, and borderline hypotension, we'll go ahead and reverse INR with FFP's. He is already been given 5 mg of vitamin K by the ED M.D.  - Gastroenterology-Dr. Elnoria Howard- has been consulted.   . Acute blood loss anemia - Secondary to above, will transfuse as needed. We need to keep hemoglobin and hematocrit above 8 given history of coronary artery disease.   . Coagulopathy - Secondary to Coumadin therapy. As noted above, will reverse INR with FFP-2 units. As noted above, already given 5 mg of vitamin K in the emergency room. - Recheck INR in the morning. - Patient and family aware, of the risk of cardioembolic phenomena and accepting all risks.   . Atrial  fibrillation - Currently rate controlled, hold metoprolol given soft blood pressure.  - Coumadin -as above   . CAD, ARTERY BYPASS GRAFT - Hold aspirin, given active lower GI bleeding.  - Hold metoprolol given borderline Hypotension an active GI bleed   . Cardiac pacemaker in situ - Monitor in telemetry   . Constipation, chronic - Currently having multiple bowel movements or bloody, continue to monitor and use laxatives/stool softeners when necessary   . Essential hypertension, benign - Blood pressure currently soft, hold all antihypertensive medications for now.   Further plan will depend as patient's clinical course evolves and further radiologic and laboratory data become available. Patient will be monitored closely.  Above noted plan was discussed with patient/ spouse , they were in agreement.   DVT Prophylaxis: SCD's  Code Status: Full Code  Total time spent for admission equals 45 minutes.  [facility] Pager [phone]  If 7PM-7AM, please contact night-coverage [email or web address] Password [facility] 02/16/2014, 1:54 PM

## 2014-02-16 NOTE — ED Notes (Signed)
Transporting patient to new room assignment. 

## 2014-02-16 NOTE — ED Notes (Signed)
MD at Bedside.

## 2014-02-16 NOTE — ED Notes (Signed)
PO contrast at bedside, left with patient's RN. Will start po prep when tolerated, or after reassessment.

## 2014-02-16 NOTE — ED Notes (Signed)
Patient transported to CT 

## 2014-02-16 NOTE — ED Notes (Signed)
Patient returned from CT

## 2014-02-16 NOTE — Consult Note (Signed)
Consult for [facility]  Reason for Consult: Diverticular bleed Referring Physician: [facility].  Dwayne [name] HPI: This is a 80 year old male with a PMH of atrial fibrillation on chronic coumadin admitted to the hospital for multiple bouts of painless hematochezia.  He reports having hematochezia starting yesterday.  Initially he thought that it would spontaneously remit, but he continued to have bloody bowel movements.  As a result he presented to the ER.  In the ER he had a blood bowel movement and he was transiently hypotensive.  His HGB was noted to be in the 12 range in December 2014 and now it has dropped into the 9 range.  A CT scan was performed and he was identified to have diverticula.  A colonoscopy was performed in 2010 and he reports that it was with a Adult nurse physician.  Past Medical History  Diagnosis Date  . Atrial fibrillation     on chronic coumadin  . Hyperlipidemia   . Bronchiectasis without acute exacerbation   . Disorders of diaphragm   . Plantar fascial fibromatosis   . Epistaxis   . Unspecified sinusitis (chronic)   . Allergic rhinitis, cause unspecified   . Spinal stenosis, unspecified region other than cervical   . Hypertension   . Coronary artery disease     s/p CABG 1993, s/p PCI of SVG to OM '03  . Tachy-brady syndrome     s/p Medtronic PPM '07  . Myocardial infarction   . Pneumonia     " multiple times "  . Shortness of breath   . GERD (gastroesophageal reflux disease)   . Arthritis     Past Surgical History  Procedure Laterality Date  . Coronary artery bypass graft      LIMA to LAD, SVG to OM, SVG to left circumflex, SVG to PD/PLSA  . Pacemaker insertion      2007, Medtronic dual-chamber  . Kidney cyst removal    . Doppler echocardiography  2011  . Cholecystectomy    . Vertebroplasty    . Cataract extraction w/ intraocular lens  implant, bilateral    . Hip arthroplasty Right 12/10/2013    Procedure: Right Hip Cemented  Hemiarthroplasty;  Surgeon: Eldred Manges, MD;  Location: [facility];  Service: Orthopedics;  Laterality: Right;  Right Hip Cemented Hemiarthroplasty    Family History  Problem Relation Age of Onset  . Cancer Neg Hx   . Heart disease Neg Hx   . Stroke Father     Social History:  reports that he has never smoked. He has never used smokeless tobacco. He reports that he does not drink alcohol or use illicit drugs.  Allergies:  Allergies  Allergen Reactions  . Ambien [Zolpidem Tartrate] Other (See Comments)    unknown  . Asparaginase Derivatives Other (See Comments)    Makes jittery & causes vision problems, headaches  . Caffeine Other (See Comments)    Interrupts sleep  . Clindamycin     REACTION: upset stomach  . Doxycycline Hyclate     REACTION: n/v/d  . Erythromycin     REACTION: n/v/d  . Lactose Intolerance (Gi) Other (See Comments)    unknown  . Lyrica [Pregabalin] Other (See Comments)    unknown  . Metronidazole     REACTION: rash, itching  . Nitrostat [Nitroglycerin] Diarrhea  . Penicillins     REACTION: hives  . Sulfonamide Derivatives     REACTION: hives  . Tetracycline     REACTION: n/v/d  Medications:  Scheduled: . furosemide  20 mg Intravenous Once  . pantoprazole  40 mg Oral Daily  . sodium chloride  3 mL Intravenous Q12H  . traZODone  100 mg Oral QHS   Continuous: . sodium chloride 125 mL/hr at 02/16/14 1529    Results for orders placed during the hospital encounter of 02/16/14 (from the past 24 hour(s))  CBC WITH DIFFERENTIAL     Status: Abnormal   Collection Time    02/16/14  9:19 AM      Result Value Ref Range   WBC 8.1  4.0 - 10.5 K/uL   RBC 3.16 (*) 4.22 - 5.81 MIL/uL   Hemoglobin 9.3 (*) 13.0 - 17.0 g/dL   HCT 32.4 (*) 40.1 - 02.7 %   MCV 90.8  78.0 - 100.0 fL   MCH 29.4  26.0 - 34.0 pg   MCHC 32.4  30.0 - 36.0 g/dL   RDW 25.3  66.4 - 40.3 %   Platelets 230  150 - 400 K/uL   Neutrophils Relative % 66  43 - 77 %   Neutro Abs 5.4  1.7 -  7.7 K/uL   Lymphocytes Relative 18  12 - 46 %   Lymphs Abs 1.4  0.7 - 4.0 K/uL   Monocytes Relative 10  3 - 12 %   Monocytes Absolute 0.8  0.1 - 1.0 K/uL   Eosinophils Relative 5  0 - 5 %   Eosinophils Absolute 0.4  0.0 - 0.7 K/uL   Basophils Relative 1  0 - 1 %   Basophils Absolute 0.1  0.0 - 0.1 K/uL  COMPREHENSIVE METABOLIC PANEL     Status: Abnormal   Collection Time    02/16/14  9:19 AM      Result Value Ref Range   Sodium 141  137 - 147 mEq/L   Potassium 4.2  3.7 - 5.3 mEq/L   Chloride 108  96 - 112 mEq/L   CO2 24  19 - 32 mEq/L   Glucose, Bld 112 (*) 70 - 99 mg/dL   BUN 17  6 - 23 mg/dL   Creatinine, Ser 4.74  0.50 - 1.35 mg/dL   Calcium 8.3 (*) 8.4 - 10.5 mg/dL   Total Protein 6.3  6.0 - 8.3 g/dL   Albumin 3.0 (*) 3.5 - 5.2 g/dL   AST 13  0 - 37 U/L   ALT 9  0 - 53 U/L   Alkaline Phosphatase 70  39 - 117 U/L   Total Bilirubin 0.3  0.3 - 1.2 mg/dL   GFR calc non Af Amer 85 (*) >90 mL/min   GFR calc Af Amer >90  >90 mL/min  PROTIME-INR     Status: Abnormal   Collection Time    02/16/14  9:19 AM      Result Value Ref Range   Prothrombin Time 29.2 (*) 11.6 - 15.2 seconds   INR 2.89 (*) 0.00 - 1.49  TYPE AND SCREEN     Status: None   Collection Time    02/16/14  9:20 AM      Result Value Ref Range   ABO/RH(D) A POS     Antibody Screen NEG     Sample Expiration 02/19/2014    POC OCCULT BLOOD, ED     Status: Abnormal   Collection Time    02/16/14 10:22 AM      Result Value Ref Range   Fecal Occult Bld POSITIVE (*) NEGATIVE  I-STAT CHEM 8, ED  Status: Abnormal   Collection Time    02/16/14 10:24 AM      Result Value Ref Range   Sodium 143  137 - 147 mEq/L   Potassium 4.0  3.7 - 5.3 mEq/L   Chloride 107  96 - 112 mEq/L   BUN 18  6 - 23 mg/dL   Creatinine, Ser 1.61  0.50 - 1.35 mg/dL   Glucose, Bld 096 (*) 70 - 99 mg/dL   Calcium, Ion 0.45  4.09 - 1.30 mmol/L   TCO2 24  0 - 100 mmol/L   Hemoglobin 9.9 (*) 13.0 - 17.0 g/dL   HCT 81.1 (*) 91.4 - 78.2 %   PREPARE FRESH FROZEN PLASMA     Status: None   Collection Time    02/16/14  1:50 PM      Result Value Ref Range   Unit Number [id number]     Blood Component Type THAWED PLASMA     Unit division 00     Status of Unit ALLOCATED     Transfusion Status OK TO TRANSFUSE     Unit Number [id number]     Blood Component Type THAWED PLASMA     Unit division 00     Status of Unit ISSUED     Transfusion Status OK TO TRANSFUSE    CBC     Status: Abnormal   Collection Time    02/16/14  3:35 PM      Result Value Ref Range   WBC 7.4  4.0 - 10.5 K/uL   RBC 2.54 (*) 4.22 - 5.81 MIL/uL   Hemoglobin 7.6 (*) 13.0 - 17.0 g/dL   HCT 40.1 (*) 02.7 - 25.3 %   MCV 90.6  78.0 - 100.0 fL   MCH 29.9  26.0 - 34.0 pg   MCHC 33.0  30.0 - 36.0 g/dL   RDW 66.4  40.3 - 47.4 %   Platelets 183  150 - 400 K/uL     Ct Abdomen Pelvis W Contrast  02/16/2014   CLINICAL DATA:  Rectal bleeding  EXAM: CT ABDOMEN AND PELVIS WITH CONTRAST  TECHNIQUE: Multidetector CT imaging of the abdomen and pelvis was performed using the standard protocol following bolus administration of intravenous contrast.  CONTRAST:  OMNIPAQUE IOHEXOL 300 MG/ML  SOLN  COMPARISON:  05/20/2010  FINDINGS: Lung bases shows atelectasis or infiltrate in left lower lobe posteriorly. Sagittal images of the spine shows degenerative changes thoracolumbar spine. There is prior vertebroplasty and compression deformity at L4 level.  Enhanced liver is unremarkable. The patient is status postcholecystectomy. Atherosclerotic calcifications of abdominal aorta, bilateral renal artery origin, SMA origin and bilateral iliac arteries are noted. Splenic artery calcifications are noted.  Kidneys are symmetrical in size and enhancement. There is a large cyst in upper pole of the left kidney measures 8 cm. A second cyst in upper pole of the left kidney measures 3.5 cm. No hydronephrosis or hydroureter.  Abundant stool noted in transverse colon. No small bowel  obstruction. No aortic aneurysm.  The pancreas, spleen and adrenal glands are unremarkable.  Scattered diverticula are noted in descending colon. No adenopathy. No ascites or free air. Multiple sigmoid colon diverticula are noted. No definite evidence of acute diverticulitis. There is moderate distension with liquid stool of distal sigmoid and rectosigmoid colon. No significant colonic wall thickening is noted. No definite colonic mass is identified.  The urinary bladder is unremarkable. Evaluation of the pelvis is limited by streak artifacts from right hip prosthesis.  IMPRESSION:  1. Moderate stool noted in transverse colon. No small bowel or colonic obstruction. Scattered diverticula are noted descending colon. Multiple sigmoid colon diverticula. No definite evidence of acute diverticulitis. 2. Moderate distension of distal sigmoid and rectum with liquid stool. No thickening of colonic wall is identified. 3. No hydronephrosis or hydroureter.  Left renal cysts are noted. 4. Extensive atherosclerotic vascular calcifications. 5. Limited evaluation of the pelvis due to metallic artifacts from right hip prosthesis.   Electronically Signed   By: Natasha MeadLiviu  Pop M.D.   On: 02/16/2014 12:32    ROS:  As stated above in the HPI otherwise negative.  Blood pressure 113/52, pulse 65, temperature 97.4 F (36.3 C), temperature source Oral, resp. rate 18, height 5\' 10"  (1.778 m), weight 167 lb 8.8 oz (76 kg), SpO2 99.00%.    PE: Gen: NAD, Alert and Oriented HEENT:  [place]/AT, EOMI Neck: Supple, no LAD Lungs: CTA Bilaterally CV: RRR without M/G/R ABM: Soft, NTND, +BS Ext: No C/C/E  Assessment/Plan: 1) Diverticular bleed. 2) Anemia. 3) Afib.   The patient is stable at this time.  It appears that he has a diverticular bleed.  He may bleed more before he stabilizes.  If he has persistent bleeding surgical intervention may be required.  His diverticula were noted to be on the left side of the colon.  Plan: 1) Follow  HGB. 2) Transfuse as necessary. 3) Agree with reversing coumadin.   Lehua Flores D 02/16/2014, 4:23 PM

## 2014-02-16 NOTE — ED Notes (Signed)
Per EMS: Rectal bleeding x2 days, increasing recently, noon yesterday patient reported large loose bowel movement that he thought was diarrhea but was in fact blood.  Blood has been trickling.  Upon ems arrival to house, patient had towel with approx 10 cc of blood on bed.  On warfarin.  Denies pain, cp, sob.  afib with pacemaker

## 2014-02-16 NOTE — ED Notes (Signed)
Unable to tolerate standing orthostatic bp check

## 2014-02-16 NOTE — ED Provider Notes (Signed)
CSN: [id number]     Arrival date & time 02/16/14  0827 History   First MD Initiated Contact with Patient 02/16/14 [phone]     Chief Complaint  Patient presents with  . Rectal Bleeding     (Consider location/radiation/quality/duration/timing/severity/associated sxs/prior Treatment) Patient is a 80 y.o. male presenting with hematochezia.  Rectal Bleeding  80 yo male presents with with BRBPR that started yesterday evening after lunch. Patient states he was in the bed when he first noticed the bleeding. Patient states he "messed the bed" yesterday. Patient reports 2 episodes of bloody diarrhea.  Patient states he typically is continent of his bowels. Patient is on coumadin x 1-2 years that was started for "heart attacks". Denies CP, SOB, Fever/chills, abdominal pain, N/V, dizziness, or visual changes. PMH significant for Afib, HLD, HTN, CAD, MI.  Past Medical History  Diagnosis Date  . Atrial fibrillation     on chronic coumadin  . Hyperlipidemia   . Bronchiectasis without acute exacerbation   . Disorders of diaphragm   . Plantar fascial fibromatosis   . Epistaxis   . Unspecified sinusitis (chronic)   . Allergic rhinitis, cause unspecified   . Spinal stenosis, unspecified region other than cervical   . Hypertension   . Coronary artery disease     s/p CABG 1993, s/p PCI of SVG to OM '03  . Tachy-brady syndrome     s/p Medtronic PPM '07  . Myocardial infarction   . Pneumonia     " multiple times "  . Shortness of breath   . GERD (gastroesophageal reflux disease)   . Arthritis    Past Surgical History  Procedure Laterality Date  . Coronary artery bypass graft      LIMA to LAD, SVG to OM, SVG to left circumflex, SVG to PD/PLSA  . Pacemaker insertion      2007, Medtronic dual-chamber  . Kidney cyst removal    . Doppler echocardiography  2011  . Cholecystectomy    . Vertebroplasty    . Cataract extraction w/ intraocular lens  implant, bilateral    . Hip arthroplasty Right  12/10/2013    Procedure: Right Hip Cemented Hemiarthroplasty;  Surgeon: Eldred Manges, MD;  Location: [facility] OR;  Service: Orthopedics;  Laterality: Right;  Right Hip Cemented Hemiarthroplasty   Family History  Problem Relation Age of Onset  . Cancer Neg Hx   . Heart disease Neg Hx   . Stroke Father    History  Substance Use Topics  . Smoking status: Never Smoker   . Smokeless tobacco: Never Used  . Alcohol Use: No    Review of Systems  Gastrointestinal: Positive for hematochezia.  All other systems reviewed and are negative.      Allergies  Ambien; Asparaginase derivatives; Caffeine; Clindamycin; Doxycycline hyclate; Erythromycin; Lactose intolerance (gi); Lyrica; Metronidazole; Nitrostat; Penicillins; Sulfonamide derivatives; and Tetracycline  Home Medications   No current outpatient prescriptions on file. BP 118/60  Pulse 63  Temp(Src) 97.4 F (36.3 C) (Oral)  Resp 15  Ht 5\' 10"  (1.778 m)  Wt 167 lb 8.8 oz (76 kg)  BMI 24.04 kg/m2  SpO2 99% Physical Exam  Nursing note and vitals reviewed. Constitutional: He is oriented to person, place, and time. He appears well-developed and well-nourished. No distress.  HENT:  Head: Normocephalic and atraumatic.  Eyes: EOM are normal. No scleral icterus.  Conjunctiva pale   Neck: No JVD present. No tracheal deviation present.  Cardiovascular: Normal rate and regular rhythm.  Exam reveals  no gallop and no friction rub.   No murmur heard. Pulses:      Radial pulses are 2+ on the right side, and 2+ on the left side.  Pulmonary/Chest: Effort normal and breath sounds normal. No respiratory distress. He has no wheezes. He has no rhonchi. He has no rales.  Abdominal: Soft. Normal appearance. He exhibits no distension. Bowel sounds are increased. There is no hepatosplenomegaly. There is tenderness in the left lower quadrant. There is no rigidity, no rebound, no guarding, no CVA tenderness, no tenderness at McBurney's point and negative  Murphy's sign.  Genitourinary: Testes normal and penis normal. Rectal exam shows no external hemorrhoid, no internal hemorrhoid, no fissure, no mass, no tenderness and anal tone normal. Guaiac positive stool (Gross Bright red blood on exam. ). Prostate is enlarged. Prostate is not tender.  Musculoskeletal: Normal range of motion. He exhibits no edema.  Neurological: He is alert and oriented to person, place, and time.  Skin: Skin is warm and dry. He is not diaphoretic. There is pallor.  Psychiatric: He has a normal mood and affect. His behavior is normal.    ED Course  Procedures (including critical care time) Labs Review Labs Reviewed  CBC WITH DIFFERENTIAL - Abnormal; Notable for the following:    RBC 3.16 (*)    Hemoglobin 9.3 (*)    HCT 28.7 (*)    All other components within normal limits  COMPREHENSIVE METABOLIC PANEL - Abnormal; Notable for the following:    Glucose, Bld 112 (*)    Calcium 8.3 (*)    Albumin 3.0 (*)    GFR calc non Af Amer 85 (*)    All other components within normal limits  PROTIME-INR - Abnormal; Notable for the following:    Prothrombin Time 29.2 (*)    INR 2.89 (*)    All other components within normal limits  CBC - Abnormal; Notable for the following:    RBC 2.54 (*)    Hemoglobin 7.6 (*)    HCT 23.0 (*)    All other components within normal limits  I-STAT CHEM 8, ED - Abnormal; Notable for the following:    Glucose, Bld 111 (*)    Hemoglobin 9.9 (*)    HCT 29.0 (*)    All other components within normal limits  POC OCCULT BLOOD, ED - Abnormal; Notable for the following:    Fecal Occult Bld POSITIVE (*)    All other components within normal limits  MRSA PCR SCREENING  HEMOGLOBIN AND HEMATOCRIT, BLOOD  HEMOGLOBIN AND HEMATOCRIT, BLOOD  BASIC METABOLIC PANEL  CBC  PROTIME-INR  TYPE AND SCREEN  PREPARE FRESH FROZEN PLASMA  PREPARE RBC (CROSSMATCH)   Imaging Review Ct Abdomen Pelvis W Contrast  02/16/2014   CLINICAL DATA:  Rectal bleeding   EXAM: CT ABDOMEN AND PELVIS WITH CONTRAST  TECHNIQUE: Multidetector CT imaging of the abdomen and pelvis was performed using the standard protocol following bolus administration of intravenous contrast.  CONTRAST:  OMNIPAQUE IOHEXOL 300 MG/ML  SOLN  COMPARISON:  05/20/2010  FINDINGS: Lung bases shows atelectasis or infiltrate in left lower lobe posteriorly. Sagittal images of the spine shows degenerative changes thoracolumbar spine. There is prior vertebroplasty and compression deformity at L4 level.  Enhanced liver is unremarkable. The patient is status postcholecystectomy. Atherosclerotic calcifications of abdominal aorta, bilateral renal artery origin, SMA origin and bilateral iliac arteries are noted. Splenic artery calcifications are noted.  Kidneys are symmetrical in size and enhancement. There is a large cyst  in upper pole of the left kidney measures 8 cm. A second cyst in upper pole of the left kidney measures 3.5 cm. No hydronephrosis or hydroureter.  Abundant stool noted in transverse colon. No small bowel obstruction. No aortic aneurysm.  The pancreas, spleen and adrenal glands are unremarkable.  Scattered diverticula are noted in descending colon. No adenopathy. No ascites or free air. Multiple sigmoid colon diverticula are noted. No definite evidence of acute diverticulitis. There is moderate distension with liquid stool of distal sigmoid and rectosigmoid colon. No significant colonic wall thickening is noted. No definite colonic mass is identified.  The urinary bladder is unremarkable. Evaluation of the pelvis is limited by streak artifacts from right hip prosthesis.  IMPRESSION: 1. Moderate stool noted in transverse colon. No small bowel or colonic obstruction. Scattered diverticula are noted descending colon. Multiple sigmoid colon diverticula. No definite evidence of acute diverticulitis. 2. Moderate distension of distal sigmoid and rectum with liquid stool. No thickening of colonic wall is  identified. 3. No hydronephrosis or hydroureter.  Left renal cysts are noted. 4. Extensive atherosclerotic vascular calcifications. 5. Limited evaluation of the pelvis due to metallic artifacts from right hip prosthesis.   Electronically Signed   By: Natasha MeadLiviu  Pop M.D.   On: 02/16/2014 12:32     EKG Interpretation None      MDM   Final diagnoses:  Lower GI bleed   Patient hypotensive, improved with IV fluids Patient in NAD.  Hemoccult positive Hgb 9.3, decreased 1.5 since prior study INR elevated at 2.89. Oral vitamin K given. CT shows diverticulosis without evidence of diverticulitis    Patient discussed with Dr. Azalia BilisKevin Campos. Plan to admit patient to hospital for acute GI bleed.   Meds given in ED:  Medications  traZODone (DESYREL) tablet 100 mg (not administered)  nitroGLYCERIN (NITROSTAT) SL tablet 0.4 mg (not administered)  pantoprazole (PROTONIX) EC tablet 40 mg (40 mg Oral Given 02/16/14 1536)  ondansetron (ZOFRAN) tablet 4 mg (not administered)    Or  ondansetron (ZOFRAN) injection 4 mg (not administered)  albuterol (PROVENTIL) (2.5 MG/3ML) 0.083% nebulizer solution 2.5 mg (not administered)  guaiFENesin-dextromethorphan (ROBITUSSIN DM) 100-10 MG/5ML syrup 5 mL (not administered)  sodium chloride 0.9 % injection 3 mL (3 mLs Intravenous Given 02/16/14 1536)  0.9 %  sodium chloride infusion ( Intravenous New Bag/Given 02/16/14 1529)  acetaminophen (TYLENOL) tablet 650 mg (not administered)    Or  acetaminophen (TYLENOL) suppository 650 mg (not administered)  morphine 2 MG/ML injection 1 mg (not administered)  furosemide (LASIX) injection 20 mg (not administered)  iohexol (OMNIPAQUE) 300 MG/ML solution 25 mL (25 mLs Oral Contrast Given 02/16/14 1100)  sodium chloride 0.9 % bolus 1,000 mL (0 mLs Intravenous Stopped 02/16/14 1046)  sodium chloride 0.9 % bolus 1,000 mL (0 mLs Intravenous Stopped 02/16/14 1046)  phytonadione (VITAMIN K) tablet 5 mg (5 mg Oral Given 02/16/14 1243)   iohexol (OMNIPAQUE) 300 MG/ML solution 100 mL (100 mLs Intravenous Contrast Given 02/16/14 1201)  acetaminophen (TYLENOL) tablet 650 mg (650 mg Oral Given 02/16/14 1533)  diphenhydrAMINE (BENADRYL) injection 25 mg (25 mg Intravenous Given 02/16/14 1533)  furosemide (LASIX) injection 20 mg (20 mg Intravenous Given 02/16/14 1649)    Current Discharge Medication List             Rudene AndaJacob Gray Real Cona, [place] JerseyPA-C 02/16/14 1712

## 2014-02-17 DIAGNOSIS — D500 Iron deficiency anemia secondary to blood loss (chronic): Secondary | ICD-10-CM | POA: Diagnosis not present

## 2014-02-17 DIAGNOSIS — I959 Hypotension, unspecified: Secondary | ICD-10-CM | POA: Diagnosis not present

## 2014-02-17 DIAGNOSIS — K5733 Diverticulitis of large intestine without perforation or abscess with bleeding: Secondary | ICD-10-CM | POA: Diagnosis not present

## 2014-02-17 DIAGNOSIS — K625 Hemorrhage of anus and rectum: Secondary | ICD-10-CM | POA: Diagnosis not present

## 2014-02-17 DIAGNOSIS — K922 Gastrointestinal hemorrhage, unspecified: Secondary | ICD-10-CM | POA: Diagnosis not present

## 2014-02-17 LAB — PREPARE FRESH FROZEN PLASMA
UNIT DIVISION: 0
Unit division: 0

## 2014-02-17 LAB — BASIC METABOLIC PANEL
BUN: 15 mg/dL (ref 6–23)
CO2: 22 meq/L (ref 19–32)
CREATININE: 0.76 mg/dL (ref 0.50–1.35)
Calcium: 7.6 mg/dL — ABNORMAL LOW (ref 8.4–10.5)
Chloride: 112 mEq/L (ref 96–112)
GFR calc Af Amer: >90 mL/min (ref >90)
GFR calc non Af Amer: 85 mL/min — ABNORMAL LOW (ref >90)
GLUCOSE: 95 mg/dL (ref 70–99)
Potassium: 3.8 mEq/L (ref 3.7–5.3)
Sodium: 148 mEq/L — ABNORMAL HIGH (ref 137–147)

## 2014-02-17 LAB — TYPE AND SCREEN
ABO/RH(D): A POS
Antibody Screen: NEGATIVE
UNIT DIVISION: 0
Unit division: 0

## 2014-02-17 LAB — CBC
HCT: 25.5 % — ABNORMAL LOW (ref 39.0–52.0)
Hemoglobin: 8.5 g/dL — ABNORMAL LOW (ref 13.0–17.0)
MCH: 29.5 pg (ref 26.0–34.0)
MCHC: 33.3 g/dL (ref 30.0–36.0)
MCV: 88.5 fL (ref 78.0–100.0)
Platelets: 191 10*3/uL (ref 150–400)
RBC: 2.88 MIL/uL — ABNORMAL LOW (ref 4.22–5.81)
RDW: 15.1 % (ref 11.5–15.5)
WBC: 10.4 10*3/uL (ref 4.0–10.5)

## 2014-02-17 LAB — HEMOGLOBIN AND HEMATOCRIT, BLOOD
HCT: 25.8 % — ABNORMAL LOW (ref 39.0–52.0)
HCT: 27.5 % — ABNORMAL LOW (ref 39.0–52.0)
Hemoglobin: 8.7 g/dL — ABNORMAL LOW (ref 13.0–17.0)
Hemoglobin: 9.4 g/dL — ABNORMAL LOW (ref 13.0–17.0)

## 2014-02-17 LAB — PROTIME-INR
INR: 1.79 — ABNORMAL HIGH (ref 0.00–1.49)
PROTHROMBIN TIME: 20.3 s — AB (ref 11.6–15.2)

## 2014-02-17 LAB — GLUCOSE, CAPILLARY: Glucose-Capillary: 96 mg/dL (ref 70–99)

## 2014-02-17 MED ORDER — SODIUM CHLORIDE 0.9 % IV SOLN
INTRAVENOUS | Status: DC
  Administered 2014-02-17: 08:00:00 via INTRAVENOUS

## 2014-02-17 MED ORDER — DEXTROSE 5 % IV SOLN
1.0000 mg | Freq: Once | INTRAVENOUS | Status: AC
  Administered 2014-02-17: 1 mg via INTRAVENOUS
  Filled 2014-02-17: qty 0.1

## 2014-02-17 MED ORDER — FINASTERIDE 5 MG PO TABS
5.0000 mg | ORAL_TABLET | Freq: Every day | ORAL | Status: DC
  Administered 2014-02-17 – 2014-02-18 (×2): 5 mg via ORAL
  Filled 2014-02-17 (×4): qty 1

## 2014-02-17 MED ORDER — METOPROLOL TARTRATE 25 MG PO TABS
25.0000 mg | ORAL_TABLET | Freq: Two times a day (BID) | ORAL | Status: DC
  Administered 2014-02-17 – 2014-02-19 (×5): 25 mg via ORAL
  Filled 2014-02-17 (×7): qty 1

## 2014-02-17 MED ORDER — DIPHENHYDRAMINE HCL 50 MG/ML IJ SOLN: 25.0000 mg | Freq: Every evening | INTRAMUSCULAR | Status: DC | PRN

## 2014-02-17 MED ORDER — DIPHENHYDRAMINE HCL 25 MG PO CAPS: 25.0000 mg | ORAL_CAPSULE | Freq: Every evening | ORAL | Status: DC | PRN

## 2014-02-17 MED ORDER — DIPHENHYDRAMINE HCL 25 MG PO CAPS
25.0000 mg | ORAL_CAPSULE | Freq: Every evening | ORAL | Status: DC | PRN
  Administered 2014-02-17: 25 mg via ORAL
  Filled 2014-02-17: qty 1

## 2014-02-17 MED ORDER — DEXTROSE 5 % IV SOLN
INTRAVENOUS | Status: AC
  Administered 2014-02-17: 11:00:00 via INTRAVENOUS

## 2014-02-17 MED ORDER — HALOPERIDOL LACTATE 5 MG/ML IJ SOLN
2.0000 mg | Freq: Four times a day (QID) | INTRAMUSCULAR | Status: DC | PRN
  Administered 2014-02-17: 2 mg via INTRAVENOUS
  Filled 2014-02-17: qty 1

## 2014-02-17 NOTE — ED Provider Notes (Signed)
Medical screening examination/treatment/procedure(s) were conducted as a shared visit with non-physician practitioner(s) and myself.  I personally evaluated the patient during the encounter.   EKG Interpretation None      CRITICAL CARE Performed by: Lyanne CoAMPOS,Dameisha Tschida M Total critical care time: 35 Critical care time was exclusive of separately billable procedures and treating other patients. Critical care was necessary to treat or prevent imminent or life-threatening deterioration. Critical care was time spent personally by me on the following activities: development of treatment plan with patient and/or surrogate as well as nursing, discussions with consultants, evaluation of patient's response to treatment, examination of patient, obtaining history from patient or surrogate, ordering and performing treatments and interventions, ordering and review of laboratory studies, ordering and review of radiographic studies, pulse oximetry and re-evaluation of patient's condition.   Significant lower GI bleed.  The patient had a recurrent episode of significant bright red blood while in the emergency department.  The patient is on Coumadin.  He was initially given vitamin K.  When he developed a second episode of bright red blood the patient was given FFP.  The patient did have transient hypotension emergency apartment but responded to IV fluids.  The patient be admitted to the step down unit.  Ct Abdomen Pelvis W Contrast  02/16/2014   CLINICAL DATA:  Rectal bleeding  EXAM: CT ABDOMEN AND PELVIS WITH CONTRAST  TECHNIQUE: Multidetector CT imaging of the abdomen and pelvis was performed using the standard protocol following bolus administration of intravenous contrast.  CONTRAST:  100mL OMNIPAQUE IOHEXOL 300 MG/ML  SOLN  COMPARISON:  05/20/2010  FINDINGS: Lung bases shows atelectasis or infiltrate in left lower lobe posteriorly. Sagittal images of the spine shows degenerative changes thoracolumbar spine. There is  prior vertebroplasty and compression deformity at L4 level.  Enhanced liver is unremarkable. The patient is status postcholecystectomy. Atherosclerotic calcifications of abdominal aorta, bilateral renal artery origin, SMA origin and bilateral iliac arteries are noted. Splenic artery calcifications are noted.  Kidneys are symmetrical in size and enhancement. There is a large cyst in upper pole of the left kidney measures 8 cm. A second cyst in upper pole of the left kidney measures 3.5 cm. No hydronephrosis or hydroureter.  Abundant stool noted in transverse colon. No small bowel obstruction. No aortic aneurysm.  The pancreas, spleen and adrenal glands are unremarkable.  Scattered diverticula are noted in descending colon. No adenopathy. No ascites or free air. Multiple sigmoid colon diverticula are noted. No definite evidence of acute diverticulitis. There is moderate distension with liquid stool of distal sigmoid and rectosigmoid colon. No significant colonic wall thickening is noted. No definite colonic mass is identified.  The urinary bladder is unremarkable. Evaluation of the pelvis is limited by streak artifacts from right hip prosthesis.  IMPRESSION: 1. Moderate stool noted in transverse colon. No small bowel or colonic obstruction. Scattered diverticula are noted descending colon. Multiple sigmoid colon diverticula. No definite evidence of acute diverticulitis. 2. Moderate distension of distal sigmoid and rectum with liquid stool. No thickening of colonic wall is identified. 3. No hydronephrosis or hydroureter.  Left renal cysts are noted. 4. Extensive atherosclerotic vascular calcifications. 5. Limited evaluation of the pelvis due to metallic artifacts from right hip prosthesis.   Electronically Signed   By: Natasha MeadLiviu  Pop M.D.   On: 02/16/2014 12:32   Dg Shoulder Left  01/24/2014   CLINICAL DATA:  Fall.  EXAM: LEFT SHOULDER - 2+ VIEW  COMPARISON:  None.  FINDINGS: Degenerative changes left shoulder.  Calcification in  the region supraspinatus tendon consistent consistent with calcific tendinitis. No evidence of fracture or dislocation. No evidence separation. Anterior inferior subluxation of the left shoulder cannot be excluded.  IMPRESSION: Degenerative change left shoulder. Anterior inferior subluxation of the left humeral head cannot be excluded.   Electronically Signed   By: Maisie Fus  Register   On: 01/24/2014 14:34  I personally reviewed the imaging tests through PACS system I reviewed available ER/hospitalization records through the EMR   Lyanne Co, MD 02/17/14 [phone]

## 2014-02-17 NOTE — Progress Notes (Signed)
Patient Demographics  Dwayne [name], is a 80 y.o. male, DOB - 1932/11/16, ZOX:[id number]  Admit date - 02/16/2014   Admitting Physician Maretta Bees, MD  Outpatient Primary MD for the patient is Sanda Linger, MD  LOS - 1   Chief Complaint  Patient presents with  . Rectal Bleeding        Assessment & Plan    . Lower GI bleed likely diverticular causing acute blood loss anemia with patient on Coumadin   - Suspected diverticular bleed. Nausea vomiting or abdominal discomfort. Status post 2 units packed RBC transfusion on 02/16/2014, reverse Coumadin, continue bowel rest, IV fluids, PPI, H&H monitoring. Goal hemoglobin above 8. GI following.   - . Coagulopathy  - Secondary to Coumadin therapy. As noted above, will reverse INR with FFP-2 units. As noted above, already given 5 mg of vitamin K in the emergency room. 1 mg of vitamin K repeated on 02/17/2014 we'll continue to monitor INR till under 1.5. - Patient and family aware, of the risk of cardioembolic phenomena and accepting all risks. Risks  and benefits explained and again personally on 02/17/2014 to the patient.     . Atrial fibrillation  - Currently rate controlled, continue beta blocker. Coumadin on hold     . CAD, ARTERY BYPASS GRAFT  - No aspirin or Coumadin due to bleed, beta blocker continue, chest pain and symptom-free from cardiac standpoint.    . Cardiac pacemaker in situ  - Monitor in telemetry      . Constipation, chronic  - Currently having multiple bowel movements or bloody, continue to monitor and use laxatives/stool softeners when necessary      . Essential hypertension, benign  - Resume beta blocker monitor    . Mild hypernatremia.  Change fluid to D5W     Code Status: Full  Family  Communication: None present  Disposition Plan: Home   Procedures CT scan abdomen pelvis   Consults  GI - Hung   Medications  Scheduled Meds: . finasteride  5 mg Oral QHS  . metoprolol tartrate  25 mg Oral BID  . pantoprazole  40 mg Oral Daily  . phytonadione (VITAMIN K) IV  1 mg Intravenous Once  . sodium chloride  3 mL Intravenous Q12H  . traZODone  100 mg Oral QHS   Continuous Infusions: . sodium chloride 75 mL/hr at 02/17/14 0733   PRN Meds:.acetaminophen, acetaminophen, albuterol, diphenhydrAMINE, diphenhydrAMINE, guaiFENesin-dextromethorphan, morphine injection, nitroGLYCERIN, ondansetron (ZOFRAN) IV  DVT Prophylaxis  SCDs    Lab Results  Component Value Date   PLT 191 02/17/2014    Antibiotics     Anti-infectives   None          Subjective:   Dwayne [name] today has, No headache, No chest pain, No abdominal pain - No Nausea, No new weakness tingling or numbness, No Cough - SOB.   Objective:   Filed Vitals:   02/16/14 2200 02/16/14 2230 02/16/14 2330 02/17/14 0451  BP: 103/57 116/56 108/58 111/88  Pulse: 52 67 78 78  Temp:  97.7 F (36.5 C) 97.8 F (36.6 C) 97.9 F (36.6 C)  TempSrc:  Oral Oral Oral  Resp: 21 16 23 21   Height:      Weight:  SpO2: 97% 95% 99% 95%    Wt Readings from Last 3 Encounters:  02/16/14 76 kg (167 lb 8.8 oz)  01/24/14 77.338 kg (170 lb 8 oz)  12/09/13 73.029 kg (161 lb)     Intake/Output Summary (Last 24 hours) at 02/17/14 0908 Last data filed at 02/17/14 0606  Gross per 24 hour  Intake 3089.58 ml  Output   4325 ml  Net -1235.42 ml     Physical Exam  Awake Alert, Oriented X 2, No new F.N deficits, Normal affect [place].AT,PERRAL Supple Neck,No JVD, No cervical lymphadenopathy appriciated.  Symmetrical Chest wall movement, Good air movement bilaterally, CTAB RRR,No Gallops,Rubs or new Murmurs, No Parasternal Heave +ve B.Sounds, Abd Soft, Non tender, No organomegaly appriciated, No rebound - guarding or  rigidity. No Cyanosis, Clubbing or edema, No new Rash or bruise      Data Review   Micro Results Recent Results (from the past 240 hour(s))  MRSA PCR SCREENING     Status: None   Collection Time    02/16/14  4:00 PM      Result Value Ref Range Status   MRSA by PCR NEGATIVE  NEGATIVE Final   Comment:            The GeneXpert MRSA Assay (FDA     approved for NASAL specimens     only), is one component of a     comprehensive MRSA colonization     surveillance program. It is not     intended to diagnose MRSA     infection nor to guide or     monitor treatment for     MRSA infections.    Radiology Reports Ct Abdomen Pelvis W Contrast  02/16/2014   CLINICAL DATA:  Rectal bleeding  EXAM: CT ABDOMEN AND PELVIS WITH CONTRAST  TECHNIQUE: Multidetector CT imaging of the abdomen and pelvis was performed using the standard protocol following bolus administration of intravenous contrast.  CONTRAST:  OMNIPAQUE IOHEXOL 300 MG/ML  SOLN  COMPARISON:  05/20/2010  FINDINGS: Lung bases shows atelectasis or infiltrate in left lower lobe posteriorly. Sagittal images of the spine shows degenerative changes thoracolumbar spine. There is prior vertebroplasty and compression deformity at L4 level.  Enhanced liver is unremarkable. The patient is status postcholecystectomy. Atherosclerotic calcifications of abdominal aorta, bilateral renal artery origin, SMA origin and bilateral iliac arteries are noted. Splenic artery calcifications are noted.  Kidneys are symmetrical in size and enhancement. There is a large cyst in upper pole of the left kidney measures 8 cm. A second cyst in upper pole of the left kidney measures 3.5 cm. No hydronephrosis or hydroureter.  Abundant stool noted in transverse colon. No small bowel obstruction. No aortic aneurysm.  The pancreas, spleen and adrenal glands are unremarkable.  Scattered diverticula are noted in descending colon. No adenopathy. No ascites or free air. Multiple  sigmoid colon diverticula are noted. No definite evidence of acute diverticulitis. There is moderate distension with liquid stool of distal sigmoid and rectosigmoid colon. No significant colonic wall thickening is noted. No definite colonic mass is identified.  The urinary bladder is unremarkable. Evaluation of the pelvis is limited by streak artifacts from right hip prosthesis.  IMPRESSION: 1. Moderate stool noted in transverse colon. No small bowel or colonic obstruction. Scattered diverticula are noted descending colon. Multiple sigmoid colon diverticula. No definite evidence of acute diverticulitis. 2. Moderate distension of distal sigmoid and rectum with liquid stool. No thickening of colonic wall is identified. 3. No hydronephrosis  or hydroureter.  Left renal cysts are noted. 4. Extensive atherosclerotic vascular calcifications. 5. Limited evaluation of the pelvis due to metallic artifacts from right hip prosthesis.   Electronically Signed   By: Natasha MeadLiviu  Pop M.D.   On: 02/16/2014 12:32   Dg Shoulder Left  01/24/2014   CLINICAL DATA:  Fall.  EXAM: LEFT SHOULDER - 2+ VIEW  COMPARISON:  None.  FINDINGS: Degenerative changes left shoulder. Calcification in the region supraspinatus tendon consistent consistent with calcific tendinitis. No evidence of fracture or dislocation. No evidence separation. Anterior inferior subluxation of the left shoulder cannot be excluded.  IMPRESSION: Degenerative change left shoulder. Anterior inferior subluxation of the left humeral head cannot be excluded.   Electronically Signed   By: Maisie Fushomas  Register   On: 01/24/2014 14:34    CBC  Recent Labs Lab 02/16/14 0919 02/16/14 1024 02/16/14 1535 02/17/14 0305  WBC 8.1  --  7.4 10.4  HGB 9.3* 9.9* 7.6* 8.5*  HCT 28.7* 29.0* 23.0* 25.5*  PLT 230  --  183 191  MCV 90.8  --  90.6 88.5  MCH 29.4  --  29.9 29.5  MCHC 32.4  --  33.0 33.3  RDW 15.4  --  15.3 15.1  LYMPHSABS 1.4  --   --   --   MONOABS 0.8  --   --   --     EOSABS 0.4  --   --   --   BASOSABS 0.1  --   --   --     Chemistries   Recent Labs Lab 02/16/14 0919 02/16/14 1024 02/17/14 0305  NA 141 143 148*  K 4.2 4.0 3.8  CL 108 107 112  CO2 24  --  22  GLUCOSE 112* 111* 95  BUN 17 18 15   CREATININE 0.75 0.90 0.76  CALCIUM 8.3*  --  7.6*  AST 13  --   --   ALT 9  --   --   ALKPHOS 70  --   --   BILITOT 0.3  --   --    ------------------------------------------------------------------------------------------------------------------ estimated creatinine clearance is 77.3 ml/min (by C-G formula based on Cr of 0.76). ------------------------------------------------------------------------------------------------------------------ No results found for this basename: HGBA1C,  in the last 72 hours ------------------------------------------------------------------------------------------------------------------ No results found for this basename: CHOL, HDL, LDLCALC, TRIG, CHOLHDL, LDLDIRECT,  in the last 72 hours ------------------------------------------------------------------------------------------------------------------ No results found for this basename: TSH, T4TOTAL, FREET3, T3FREE, THYROIDAB,  in the last 72 hours ------------------------------------------------------------------------------------------------------------------ No results found for this basename: VITAMINB12, FOLATE, FERRITIN, TIBC, IRON, RETICCTPCT,  in the last 72 hours  Coagulation profile  Recent Labs Lab 02/11/14 02/16/14 0919 02/17/14 0305  INR 2.6 2.89* 1.79*    No results found for this basename: DDIMER,  in the last 72 hours  Cardiac Enzymes No results found for this basename: CK, CKMB, TROPONINI, MYOGLOBIN,  in the last 168 hours ------------------------------------------------------------------------------------------------------------------ No components found with this basename: POCBNP,      Time Spent in minutes  35   SINGH,PRASHANT K  M.D on 02/17/2014 at 9:08 AM  Between 7am to 7pm - Pager - [phone]  After 7pm go to [email or web address] - password [email or web address]  And look for the night coverage person covering for me after hours  [facility] Office  [phone]

## 2014-02-17 NOTE — Progress Notes (Signed)
Subjective: No further hematochezia.  Feeling well.  Objective: Vital signs in last 24 hours: Temp:  [97.4 F (36.3 C)-98.1 F (36.7 C)] 97.9 F (36.6 C) (03/01 0451) Pulse Rate:  [52-84] 68 (03/01 1036) Resp:  [15-31] 15 (03/01 1036) BP: (89-132)/(48-88) 107/53 mmHg (03/01 1036) SpO2:  [91 %-100 %] 95 % (03/01 1036) Weight:  [167 lb 8.8 oz (76 kg)] 167 lb 8.8 oz (76 kg) (02/28 1540) Last BM Date: 02/16/14 (bloody stools. MD aware)  Intake/Output from previous day: 02/28 0701 - 03/01 0700 In: 3089.6 [I.V.:1814.6; Blood:1275] Out: 4325 [Urine:4125; Stool:200] Intake/Output this shift: Total I/O In: 50 [IV Piggyback:50] Out: -   General appearance: alert and no distress GI: soft, non-tender; bowel sounds normal; no masses,  no organomegaly  Lab Results:  Recent Labs  02/16/14 0919  02/16/14 1535 02/17/14 0305 02/17/14 1034  WBC 8.1  --  7.4 10.4  --   HGB 9.3*  < > 7.6* 8.5* 8.7*  HCT 28.7*  < > 23.0* 25.5* 25.8*  PLT 230  --  183 191  --   < > = values in this interval not displayed. BMET  Recent Labs  02/16/14 0919 02/16/14 1024 02/17/14 0305  NA 141 143 148*  K 4.2 4.0 3.8  CL 108 107 112  CO2 24  --  22  GLUCOSE 112* 111* 95  BUN 17 18 15   CREATININE 0.75 0.90 0.76  CALCIUM 8.3*  --  7.6*   LFT  Recent Labs  02/16/14 0919  PROT 6.3  ALBUMIN 3.0*  AST 13  ALT 9  ALKPHOS 70  BILITOT 0.3   PT/INR  Recent Labs  02/16/14 0919 02/17/14 0305  LABPROT 29.2* 20.3*  INR 2.89* 1.79*   Hepatitis Panel No results found for this basename: HEPBSAG, HCVAB, HEPAIGM, HEPBIGM,  in the last 72 hours C-Diff No results found for this basename: CDIFFTOX,  in the last 72 hours Fecal Lactopherrin No results found for this basename: FECLLACTOFRN,  in the last 72 hours  Studies/Results: Ct Abdomen Pelvis W Contrast  02/16/2014   CLINICAL DATA:  Rectal bleeding  EXAM: CT ABDOMEN AND PELVIS WITH CONTRAST  TECHNIQUE: Multidetector CT imaging of the abdomen  and pelvis was performed using the standard protocol following bolus administration of intravenous contrast.  CONTRAST:  100mL OMNIPAQUE IOHEXOL 300 MG/ML  SOLN  COMPARISON:  05/20/2010  FINDINGS: Lung bases shows atelectasis or infiltrate in left lower lobe posteriorly. Sagittal images of the spine shows degenerative changes thoracolumbar spine. There is prior vertebroplasty and compression deformity at L4 level.  Enhanced liver is unremarkable. The patient is status postcholecystectomy. Atherosclerotic calcifications of abdominal aorta, bilateral renal artery origin, SMA origin and bilateral iliac arteries are noted. Splenic artery calcifications are noted.  Kidneys are symmetrical in size and enhancement. There is a large cyst in upper pole of the left kidney measures 8 cm. A second cyst in upper pole of the left kidney measures 3.5 cm. No hydronephrosis or hydroureter.  Abundant stool noted in transverse colon. No small bowel obstruction. No aortic aneurysm.  The pancreas, spleen and adrenal glands are unremarkable.  Scattered diverticula are noted in descending colon. No adenopathy. No ascites or free air. Multiple sigmoid colon diverticula are noted. No definite evidence of acute diverticulitis. There is moderate distension with liquid stool of distal sigmoid and rectosigmoid colon. No significant colonic wall thickening is noted. No definite colonic mass is identified.  The urinary bladder is unremarkable. Evaluation of the pelvis is limited  by streak artifacts from right hip prosthesis.  IMPRESSION: 1. Moderate stool noted in transverse colon. No small bowel or colonic obstruction. Scattered diverticula are noted descending colon. Multiple sigmoid colon diverticula. No definite evidence of acute diverticulitis. 2. Moderate distension of distal sigmoid and rectum with liquid stool. No thickening of colonic wall is identified. 3. No hydronephrosis or hydroureter.  Left renal cysts are noted. 4. Extensive  atherosclerotic vascular calcifications. 5. Limited evaluation of the pelvis due to metallic artifacts from right hip prosthesis.   Electronically Signed   By: Natasha Mead M.D.   On: 02/16/2014 12:32    Medications:  Scheduled: . finasteride  5 mg Oral QHS  . metoprolol tartrate  25 mg Oral BID  . pantoprazole  40 mg Oral Daily  . sodium chloride  3 mL Intravenous Q12H  . traZODone  100 mg Oral QHS   Continuous: . dextrose 50 mL/hr at 02/17/14 1031    Assessment/Plan: 1) Diverticular bleed. 2) Anemia.   Clinically he is well.  Appears to be improved compared to yesterday.  No further bleeding.  Plan: 1) Continue to monitor HGB. 3) [facility] to take over tomorrow AM. 2) Transfuse as necessary.  4) Advance to a regular diet.  LOS: 1 day   Dwayne [name] D 02/17/2014, 12:00 PM

## 2014-02-18 ENCOUNTER — Telehealth: Payer: Self-pay | Admitting: Cardiology

## 2014-02-18 DIAGNOSIS — K922 Gastrointestinal hemorrhage, unspecified: Secondary | ICD-10-CM | POA: Diagnosis not present

## 2014-02-18 DIAGNOSIS — D62 Acute posthemorrhagic anemia: Secondary | ICD-10-CM | POA: Diagnosis not present

## 2014-02-18 DIAGNOSIS — K5731 Diverticulosis of large intestine without perforation or abscess with bleeding: Secondary | ICD-10-CM | POA: Diagnosis not present

## 2014-02-18 LAB — BASIC METABOLIC PANEL
BUN: 10 mg/dL (ref 6–23)
CALCIUM: 7.7 mg/dL — AB (ref 8.4–10.5)
CO2: 22 mEq/L (ref 19–32)
Chloride: 106 mEq/L (ref 96–112)
Creatinine, Ser: 0.68 mg/dL (ref 0.50–1.35)
GFR calc Af Amer: >90 mL/min (ref >90)
GFR calc non Af Amer: 88 mL/min — ABNORMAL LOW (ref >90)
Glucose, Bld: 109 mg/dL — ABNORMAL HIGH (ref 70–99)
POTASSIUM: 3.3 meq/L — AB (ref 3.7–5.3)
Sodium: 140 mEq/L (ref 137–147)

## 2014-02-18 LAB — PROTIME-INR
INR: 1.27 (ref 0.00–1.49)
PROTHROMBIN TIME: 15.6 s — AB (ref 11.6–15.2)

## 2014-02-18 LAB — HEMOGLOBIN AND HEMATOCRIT, BLOOD
HCT: 25.7 % — ABNORMAL LOW (ref 39.0–52.0)
Hemoglobin: 8.7 g/dL — ABNORMAL LOW (ref 13.0–17.0)

## 2014-02-18 MED ORDER — PANTOPRAZOLE SODIUM 40 MG PO TBEC
40.0000 mg | DELAYED_RELEASE_TABLET | Freq: Every day | ORAL | Status: DC
  Administered 2014-02-18 – 2014-02-19 (×2): 40 mg via ORAL
  Filled 2014-02-18 (×2): qty 1

## 2014-02-18 MED ORDER — POTASSIUM CHLORIDE CRYS ER 20 MEQ PO TBCR
40.0000 meq | EXTENDED_RELEASE_TABLET | Freq: Once | ORAL | Status: AC
  Administered 2014-02-18: 40 meq via ORAL
  Filled 2014-02-18: qty 2

## 2014-02-18 MED ORDER — NON FORMULARY: 5.0000 mg | Freq: Every day | Status: DC

## 2014-02-18 MED ORDER — MELATONIN 3 MG PO TABS
3.0000 mg | ORAL_TABLET | Freq: Every day | ORAL | Status: DC
  Administered 2014-02-18: 3 mg via ORAL
  Filled 2014-02-18 (×2): qty 1

## 2014-02-18 MED ORDER — PANTOPRAZOLE SODIUM 40 MG PO TBEC: 40.0000 mg | DELAYED_RELEASE_TABLET | Freq: Two times a day (BID) | ORAL | Status: DC

## 2014-02-18 MED ORDER — POTASSIUM CHLORIDE CRYS ER 20 MEQ PO TBCR
20.0000 meq | EXTENDED_RELEASE_TABLET | Freq: Once | ORAL | Status: AC
  Administered 2014-02-18: 20 meq via ORAL
  Filled 2014-02-18: qty 1

## 2014-02-18 NOTE — Progress Notes (Signed)
Daily Rounding Note  02/18/2014, 8:10 AM  LOS: 2 days   SUBJECTIVE:       No stools overnight.  Relishing solid breakfast.  Confusion, agitation overnight: pulled out IVs, trying to leave hospital.  Sitter at bedside currently.   OBJECTIVE:         Vital signs in last 24 hours:    Temp:  [97.7 F (36.5 C)-98.4 F (36.9 C)] 98.4 F (36.9 C) (03/02 0739) Pulse Rate:  [64-73] 68 (03/02 0739) Resp:  [14-22] 20 (03/02 0739) BP: (107-152)/(53-79) 152/73 mmHg (03/02 0739) SpO2:  [95 %-98 %] 95 % (03/02 0739) Weight:  [76 kg (167 lb 8.8 oz)] 76 kg (167 lb 8.8 oz) (03/02 0339) Last BM Date: 02/16/14 (bloody stools. MD aware) General: pleasant, alert, aged appearing, somewhat frail, comfortable   Heart: Paced rhythm.  No MRG Chest: fine crackles in bases, occasional cough.  No dyspnea Abdomen: soft, NT, BS hypoactive.    Extremities: 1+ ankle edema. Neuro/Psych:  Pleasant, cooperative, oriented only to self and his birthday.  [place] [employer] he came from work, that the hospital room was at his workplace.   Intake/Output from previous day: 03/01 0701 - 03/02 0700 In: 1068.8 [P.O.:240; I.V.:778.8; IV Piggyback:50] Out: 1850 [Urine:1850]  Intake/Output this shift: Total I/O In: -  Out: 250 [Urine:250]  Lab Results:  Recent Labs  02/16/14 0919  02/16/14 1535 02/17/14 0305 02/17/14 1034 02/17/14 1855 02/18/14 0340  WBC 8.1  --  7.4 10.4  --   --   --   HGB 9.3*  < > 7.6* 8.5* 8.7* 9.4* 8.7*  HCT 28.7*  < > 23.0* 25.5* 25.8* 27.5* 25.7*  PLT 230  --  183 191  --   --   --   < > = values in this interval not displayed. BMET  Recent Labs  02/16/14 0919 02/16/14 1024 02/17/14 0305 02/18/14 0340  NA 141 143 148* 140  K 4.2 4.0 3.8 3.3*  CL 108 107 112 106  CO2 24  --  22 22  GLUCOSE 112* 111* 95 109*  BUN 17 18 15 10   CREATININE 0.75 0.90 0.76 0.68  CALCIUM 8.3*  --  7.6* 7.7*   LFT  Recent Labs  02/16/14 0919    PROT 6.3  ALBUMIN 3.0*  AST 13  ALT 9  ALKPHOS 70  BILITOT 0.3   PT/INR  Recent Labs  02/17/14 0305 02/18/14 0340  LABPROT 20.3* 15.6*  INR 1.79* 1.27   Hepatitis Panel No results found for this basename: HEPBSAG, HCVAB, HEPAIGM, HEPBIGM,  in the last 72 hours  Studies/Results: Ct Abdomen Pelvis W Contrast 02/16/2014   CLINICAL DATA:   FINDINGS: Lung bases shows atelectasis or infiltrate in left lower lobe posteriorly. Sagittal images of the spine shows degenerative changes thoracolumbar spine. There is prior vertebroplasty and compression deformity at L4 level.  Enhanced liver is unremarkable. The patient is status postcholecystectomy. Atherosclerotic calcifications of abdominal aorta, bilateral renal artery origin, SMA origin and bilateral iliac arteries are noted. Splenic artery calcifications are noted.  Kidneys are symmetrical in size and enhancement. There is a large cyst in upper pole of the left kidney measures 8 cm. A second cyst in upper pole of the left kidney measures 3.5 cm. No hydronephrosis or hydroureter.  Abundant stool noted in transverse colon. No small bowel obstruction. No aortic aneurysm.  The pancreas, spleen and adrenal glands are unremarkable.  Scattered diverticula are noted in descending  colon. No adenopathy. No ascites or free air. Multiple sigmoid colon diverticula are noted. No definite evidence of acute diverticulitis. There is moderate distension with liquid stool of distal sigmoid and rectosigmoid colon. No significant colonic wall thickening is noted. No definite colonic mass is identified.  The urinary bladder is unremarkable. Evaluation of the pelvis is limited by streak artifacts from right hip prosthesis.  IMPRESSION: 1. Moderate stool noted in transverse colon. No small bowel or colonic obstruction. Scattered diverticula are noted descending colon. Multiple sigmoid colon diverticula. No definite evidence of acute diverticulitis. 2. Moderate distension of  distal sigmoid and rectum with liquid stool. No thickening of colonic wall is identified. 3. No hydronephrosis or hydroureter.  Left renal cysts are noted. 4. Extensive atherosclerotic vascular calcifications. 5. Limited evaluation of the pelvis due to metallic artifacts from right hip prosthesis.   Electronically Signed   By: Natasha MeadLiviu  Pop M.D.   On: 02/16/2014 12:32    ASSESMENT:   *  Hematochezia.  Presumed diverticular bleed.  Tics noted on above CT scan.  Colonoscopy 09/2009. There are no copies of purportedly normal report.  No bleeding in at least 12 hours.  *  ABL anemia. Hgb pre hip fracture ~ 12.0, post hip fracture/surgery ~ 9.5, nadir 2/28: 7.6.  *  S/p pacemaker. 2007.  Hx tachy/brady syndrome *  Hx PAF.  On Coumadin.  S/p CABG *  Dementia.  Sundowning behaviors overnight.  *  S/p lumbar kyphoplasty 10/2012 *  Closed right hip fracture admission 12/07/13 - 12/12/13. S/p hemiarthroplasty.  *  Cholecystectomy 04/2010.    PLAN   *  ? How long to hold Coumadin?  Will d/w MD.  *  AM CBC    Jennye MoccasinSarah Gribbin  02/18/2014, 8:10 AM Pager: [phone]  GI ATTENDING  History, Lipitor is reviewed. Patient personally seen and examined. Wife in room. Agree with H&P as above. Appears to have low-grade diverticular bleeding. Some recurrent bleeding this afternoon. Hemodynamically stable. Hold colonoscopy report being solicited for review. Continue with supportive care. Will follow.  Wilhemina BonitoJohn N. Eda KeysPerry, Jr., M.D. [facility] Division of Gastroenterology

## 2014-02-18 NOTE — Progress Notes (Signed)
Nurse tech reported that patient had a bloody stool this morning.  He subsequently had another stool that was soft, dark brown with red blood tinge and blood evident when wiping.

## 2014-02-18 NOTE — Progress Notes (Signed)
Called report to nurse on 6E.  Updated on patient history and current condition.  Patient is stable and able to transfer at this time.

## 2014-02-18 NOTE — Progress Notes (Signed)
Patient Demographics  Dwayne [name], is a 80 y.o. male, DOB - Feb 16, 1932, [id number]  Admit date - 02/16/2014   Admitting Physician Maretta Bees, MD  Outpatient Primary MD for the patient is Sanda Linger, MD  LOS - 2   Chief Complaint  Patient presents with  . Rectal Bleeding        Assessment & Plan    . Lower GI bleed likely diverticular causing acute blood loss anemia with patient on Coumadin   - Suspected diverticular bleed. No Nausea vomiting or abdominal discomfort. Status post 2 units packed RBC transfusion on 02/16/2014, reversed Coumadin, and itching in stable, diet advanced, stop IV fluids, continue PPI, H&H monitoring. Goal hemoglobin above 8. GI following. Likely discharge in the morning we'll defer to GI when to resume Coumadin.   - . Coagulopathy  - Secondary to Coumadin therapy. As noted above, reversed with FFP-2 units and vitamin K, INR is under 1.5. - Patient and family aware, of the risk of cardioembolic phenomena and accepting all risks. Risks  and benefits explained and again personally on 02/17/2014 to the patient.     . Atrial fibrillation  - Currently rate controlled, continue beta blocker. Coumadin on hold     . CAD, ARTERY BYPASS GRAFT  - No aspirin or Coumadin due to bleed, beta blocker continue, chest pain and symptom-free from cardiac standpoint.    . Cardiac pacemaker in situ  - Monitor in telemetry      . Constipation, chronic  - Currently having multiple bowel movements or bloody, continue to monitor and use laxatives/stool softeners when necessary      . Essential hypertension, benign  - Resumed beta blocker monitor    . Mild hypernatremia.  Resolved with IV fluids     Code Status: Full  Family Communication: None  present  Disposition Plan: Home   Procedures CT scan abdomen pelvis   Consults  GI - Hung   Medications  Scheduled Meds: . finasteride  5 mg Oral QHS  . metoprolol tartrate  25 mg Oral BID  . pantoprazole  40 mg Oral Daily  . sodium chloride  3 mL Intravenous Q12H  . traZODone  100 mg Oral QHS   Continuous Infusions:   PRN Meds:.acetaminophen, acetaminophen, albuterol, diphenhydrAMINE, diphenhydrAMINE, guaiFENesin-dextromethorphan, haloperidol lactate, morphine injection, nitroGLYCERIN, ondansetron (ZOFRAN) IV  DVT Prophylaxis  SCDs    Lab Results  Component Value Date   PLT 191 02/17/2014    Antibiotics     Anti-infectives   None          Subjective:   [name] today has, No headache, No chest pain, No abdominal pain - No Nausea, No new weakness tingling or numbness, No Cough - SOB. No further blood in stool  Objective:   Filed Vitals:   02/17/14 2318 02/18/14 0339 02/18/14 0739 02/18/14 1012  BP:  134/79 152/73 128/112  Pulse:   68 63  Temp: 98.2 F (36.8 C) 97.7 F (36.5 C) 98.4 F (36.9 C)   TempSrc: Oral Axillary Oral   Resp:  21 20   Height:      Weight:  76 kg (167 lb 8.8 oz)    SpO2:   95%     Wt Readings  from Last 3 Encounters:  02/18/14 76 kg (167 lb 8.8 oz)  01/24/14 77.338 kg (170 lb 8 oz)  12/09/13 73.029 kg (161 lb)     Intake/Output Summary (Last 24 hours) at 02/18/14 1020 Last data filed at 02/18/14 1014  Gross per 24 hour  Intake 1021.84 ml  Output   2100 ml  Net -1078.16 ml     Physical Exam  Awake Alert, Oriented X 2, No new F.N deficits, Normal affect [place].AT,PERRAL Supple Neck,No JVD, No cervical lymphadenopathy appriciated.  Symmetrical Chest wall movement, Good air movement bilaterally, CTAB RRR,No Gallops,Rubs or new Murmurs, No Parasternal Heave +ve B.Sounds, Abd Soft, Non tender, No organomegaly appriciated, No rebound - guarding or rigidity. No Cyanosis, Clubbing or edema, No new Rash or bruise      Data  Review   Micro Results Recent Results (from the past 240 hour(s))  MRSA PCR SCREENING     Status: None   Collection Time    02/16/14  4:00 PM      Result Value Ref Range Status   MRSA by PCR NEGATIVE  NEGATIVE Final   Comment:            The GeneXpert MRSA Assay (FDA     approved for NASAL specimens     only), is one component of a     comprehensive MRSA colonization     surveillance program. It is not     intended to diagnose MRSA     infection nor to guide or     monitor treatment for     MRSA infections.    Radiology Reports Ct Abdomen Pelvis W Contrast  02/16/2014   CLINICAL DATA:  Rectal bleeding  EXAM: CT ABDOMEN AND PELVIS WITH CONTRAST  TECHNIQUE: Multidetector CT imaging of the abdomen and pelvis was performed using the standard protocol following bolus administration of intravenous contrast.  CONTRAST:  100mL OMNIPAQUE IOHEXOL 300 MG/ML  SOLN  COMPARISON:  05/20/2010  FINDINGS: Lung bases shows atelectasis or infiltrate in left lower lobe posteriorly. Sagittal images of the spine shows degenerative changes thoracolumbar spine. There is prior vertebroplasty and compression deformity at L4 level.  Enhanced liver is unremarkable. The patient is status postcholecystectomy. Atherosclerotic calcifications of abdominal aorta, bilateral renal artery origin, SMA origin and bilateral iliac arteries are noted. Splenic artery calcifications are noted.  Kidneys are symmetrical in size and enhancement. There is a large cyst in upper pole of the left kidney measures 8 cm. A second cyst in upper pole of the left kidney measures 3.5 cm. No hydronephrosis or hydroureter.  Abundant stool noted in transverse colon. No small bowel obstruction. No aortic aneurysm.  The pancreas, spleen and adrenal glands are unremarkable.  Scattered diverticula are noted in descending colon. No adenopathy. No ascites or free air. Multiple sigmoid colon diverticula are noted. No definite evidence of acute diverticulitis.  There is moderate distension with liquid stool of distal sigmoid and rectosigmoid colon. No significant colonic wall thickening is noted. No definite colonic mass is identified.  The urinary bladder is unremarkable. Evaluation of the pelvis is limited by streak artifacts from right hip prosthesis.  IMPRESSION: 1. Moderate stool noted in transverse colon. No small bowel or colonic obstruction. Scattered diverticula are noted descending colon. Multiple sigmoid colon diverticula. No definite evidence of acute diverticulitis. 2. Moderate distension of distal sigmoid and rectum with liquid stool. No thickening of colonic wall is identified. 3. No hydronephrosis or hydroureter.  Left renal cysts are noted. 4. Extensive  atherosclerotic vascular calcifications. 5. Limited evaluation of the pelvis due to metallic artifacts from right hip prosthesis.   Electronically Signed   By: Natasha Mead M.D.   On: 02/16/2014 12:32   Dg Shoulder Left  01/24/2014   CLINICAL DATA:  Fall.  EXAM: LEFT SHOULDER - 2+ VIEW  COMPARISON:  None.  FINDINGS: Degenerative changes left shoulder. Calcification in the region supraspinatus tendon consistent consistent with calcific tendinitis. No evidence of fracture or dislocation. No evidence separation. Anterior inferior subluxation of the left shoulder cannot be excluded.  IMPRESSION: Degenerative change left shoulder. Anterior inferior subluxation of the left humeral head cannot be excluded.   Electronically Signed   By: Maisie Fus  Register   On: 01/24/2014 14:34    CBC  Recent Labs Lab 02/16/14 0919  02/16/14 1535 02/17/14 0305 02/17/14 1034 02/17/14 1855 02/18/14 0340  WBC 8.1  --  7.4 10.4  --   --   --   HGB 9.3*  < > 7.6* 8.5* 8.7* 9.4* 8.7*  HCT 28.7*  < > 23.0* 25.5* 25.8* 27.5* 25.7*  PLT 230  --  183 191  --   --   --   MCV 90.8  --  90.6 88.5  --   --   --   MCH 29.4  --  29.9 29.5  --   --   --   MCHC 32.4  --  33.0 33.3  --   --   --   RDW 15.4  --  15.3 15.1  --   --    --   LYMPHSABS 1.4  --   --   --   --   --   --   MONOABS 0.8  --   --   --   --   --   --   EOSABS 0.4  --   --   --   --   --   --   BASOSABS 0.1  --   --   --   --   --   --   < > = values in this interval not displayed.  Chemistries   Recent Labs Lab 02/16/14 0919 02/16/14 1024 02/17/14 0305 02/18/14 0340  NA 141 143 148* 140  K 4.2 4.0 3.8 3.3*  CL 108 107 112 106  CO2 24  --  22 22  GLUCOSE 112* 111* 95 109*  BUN 17 18 15 10   CREATININE 0.75 0.90 0.76 0.68  CALCIUM 8.3*  --  7.6* 7.7*  AST 13  --   --   --   ALT 9  --   --   --   ALKPHOS 70  --   --   --   BILITOT 0.3  --   --   --    ------------------------------------------------------------------------------------------------------------------ estimated creatinine clearance is 77.3 ml/min (by C-G formula based on Cr of 0.68). ------------------------------------------------------------------------------------------------------------------ No results found for this basename: HGBA1C,  in the last 72 hours ------------------------------------------------------------------------------------------------------------------ No results found for this basename: CHOL, HDL, LDLCALC, TRIG, CHOLHDL, LDLDIRECT,  in the last 72 hours ------------------------------------------------------------------------------------------------------------------ No results found for this basename: TSH, T4TOTAL, FREET3, T3FREE, THYROIDAB,  in the last 72 hours ------------------------------------------------------------------------------------------------------------------ No results found for this basename: VITAMINB12, FOLATE, FERRITIN, TIBC, IRON, RETICCTPCT,  in the last 72 hours  Coagulation profile  Recent Labs Lab 02/16/14 0919 02/17/14 0305 02/18/14 0340  INR 2.89* 1.79* 1.27    No results found for this basename: DDIMER,  in the last 72  hours  Cardiac Enzymes No results found for this basename: CK, CKMB, TROPONINI, MYOGLOBIN,  in  the last 168 hours ------------------------------------------------------------------------------------------------------------------ No components found with this basename: POCBNP,      Time Spent in minutes  35   Salvatore Poe K M.D on 02/18/2014 at 10:20 AM  Between 7am to 7pm - Pager - [phone]  After 7pm go to [email or web address] - password [email or web address]  And look for the night coverage person covering for me after hours  [facility] Office  [phone]

## 2014-02-18 NOTE — Progress Notes (Signed)
02/18/2014 patient transfer from 2 central to 6east, he is alert, oriented and have little confusion at time. It was reported to me by the Rn that patient is up with one assist. Patient have a safety sitter in room, on a bed alarm. He was placed on telemetry when arrive on unit and he does have a pacemaker. Patient have 3 scab on the right lower leg, heels dry, sacrum red, bruises on bilateral arms and right hip a scar from a prior hip surgery. Yoselin [employer] engineer[employer] RN.

## 2014-02-18 NOTE — Telephone Encounter (Signed)
New information   Pt's wife called to report pt is in the hosp.  Went in 2/28 bleeding from Rectum.  Pt was given 2 pints of blood and plasma.  [facility]

## 2014-02-18 NOTE — Telephone Encounter (Signed)
Will make dr crenshaw aware 

## 2014-02-19 DIAGNOSIS — D689 Coagulation defect, unspecified: Secondary | ICD-10-CM | POA: Diagnosis not present

## 2014-02-19 DIAGNOSIS — K922 Gastrointestinal hemorrhage, unspecified: Secondary | ICD-10-CM | POA: Diagnosis not present

## 2014-02-19 DIAGNOSIS — K5731 Diverticulosis of large intestine without perforation or abscess with bleeding: Secondary | ICD-10-CM | POA: Diagnosis not present

## 2014-02-19 DIAGNOSIS — D62 Acute posthemorrhagic anemia: Secondary | ICD-10-CM | POA: Diagnosis not present

## 2014-02-19 LAB — HEMOGLOBIN AND HEMATOCRIT, BLOOD
HCT: 25.7 % — ABNORMAL LOW (ref 39.0–52.0)
Hemoglobin: 8.8 g/dL — ABNORMAL LOW (ref 13.0–17.0)

## 2014-02-19 LAB — GLUCOSE, CAPILLARY: Glucose-Capillary: 103 mg/dL — ABNORMAL HIGH (ref 70–99)

## 2014-02-19 LAB — POTASSIUM: Potassium: 3.8 mEq/L (ref 3.7–5.3)

## 2014-02-19 NOTE — Progress Notes (Signed)
Utilization review completed.  

## 2014-02-19 NOTE — Discharge Instructions (Signed)
Follow with Primary MD Sanda Lingerhomas Jones, MD in 7 days.  Resume Coumadin only once cleared by her family doctor and GI physician.   Get CBC, CMP, INR checked 7 days by Primary MD and again as instructed by your Primary MD.     Activity: As tolerated with Full fall precautions use walker/cane & assistance as needed   Disposition Home    Diet: Heart Healthy   For Heart failure patients - Check your Weight same time everyday, if you gain over 2 pounds, or you develop in leg swelling, experience more shortness of breath or chest pain, call your Primary MD immediately. Follow Cardiac Low Salt Diet and 1.8 lit/day fluid restriction.   On your next visit with her primary care physician please Get Medicines reviewed and adjusted.  Please request your Prim.MD to go over all Hospital Tests and Procedure/Radiological results at the follow up, please get all Hospital records sent to your Prim MD by signing hospital release before you go home.   If you experience worsening of your admission symptoms, develop shortness of breath, life threatening emergency, suicidal or homicidal thoughts you must seek medical attention immediately by calling 911 or calling your MD immediately  if symptoms less severe.  You Must read complete instructions/literature along with all the possible adverse reactions/side effects for all the Medicines you take and that have been prescribed to you. Take any new Medicines after you have completely understood and accpet all the possible adverse reactions/side effects.   Do not drive and provide baby sitting services if your were admitted for syncope or siezures until you have seen by Primary MD or a Neurologist and advised to do so again.  Do not drive when taking Pain medications.    Do not take more than prescribed Pain, Sleep and Anxiety Medications  Special Instructions: If you have smoked or chewed Tobacco  in the last 2 yrs please stop smoking, stop any regular Alcohol   and or any Recreational drug use.  Wear Seat belts while driving.   Please note  You were cared for by a hospitalist during your hospital stay. If you have any questions about your discharge medications or the care you received while you were in the hospital after you are discharged, you can call the unit and asked to speak with the hospitalist on call if the hospitalist that took care of you is not available. Once you are discharged, your primary care physician will handle any further medical issues. Please note that NO REFILLS for any discharge medications will be authorized once you are discharged, as it is imperative that you return to your primary care physician (or establish a relationship with a primary care physician if you do not have one) for your aftercare needs so that they can reassess your need for medications and monitor your lab values.

## 2014-02-19 NOTE — Discharge Summary (Signed)
Dwayne [name], is a 80 y.o. male  DOB 1932-01-10  MRN [id number].  Admission date:  02/16/2014  Admitting Physician  Maretta BeesShanker M Ghimire, MD  Discharge Date:  02/19/2014   Primary MD  Sanda Lingerhomas Jones, MD  Recommendations for primary care physician for things to    Monitor CBC, resume Coumadin  once cleared by GI and if clinically indicated.   Admission Diagnosis  Atrial fibrillation [427.31] Acute blood loss anemia [285.1] Coagulopathy [286.9] Lower GI bleed [578.9]   Discharge Diagnosis  Atrial fibrillation [427.31] Acute blood loss anemia [285.1] Coagulopathy [286.9] Lower GI bleed [578.9]  diverticular  Principal Problem:   Lower GI bleed Active Problems:   CAD, ARTERY BYPASS GRAFT   Atrial fibrillation   Cardiac pacemaker in situ   Essential hypertension, benign   Constipation, chronic   Coagulopathy   Acute blood loss anemia      Past Medical History  Diagnosis Date  . Atrial fibrillation     on chronic coumadin  . Hyperlipidemia   . Bronchiectasis without acute exacerbation   . Disorders of diaphragm   . Plantar fascial fibromatosis   . Epistaxis   . Unspecified sinusitis (chronic)   . Allergic rhinitis, cause unspecified   . Spinal stenosis, unspecified region other than cervical   . Hypertension   . Coronary artery disease     s/p CABG 1993, s/p PCI of SVG to OM '03  . Tachy-brady syndrome     s/p Medtronic PPM '07  . Myocardial infarction   . Pneumonia     " multiple times "  . Shortness of breath   . GERD (gastroesophageal reflux disease)   . Arthritis     Past Surgical History  Procedure Laterality Date  . Coronary artery bypass graft      LIMA to LAD, SVG to OM, SVG to left circumflex, SVG to PD/PLSA  . Pacemaker insertion      2007, Medtronic dual-chamber  . Kidney cyst removal    .  Doppler echocardiography  2011  . Cholecystectomy    . Vertebroplasty    . Cataract extraction w/ intraocular lens  implant, bilateral    . Hip arthroplasty Right 12/10/2013    Procedure: Right Hip Cemented Hemiarthroplasty;  Surgeon: Eldred MangesMark C Yates, MD;  [facility];  Service: Orthopedics;  Laterality: Right;  Right Hip Cemented Hemiarthroplasty     Discharge Condition: Stable   Follow UP  Follow-up Information   Follow up with Sanda Lingerhomas Jones, MD. Schedule an appointment as soon as possible for a visit in 1 week.   Specialty:  Internal Medicine   Contact information:   [address] [place] [place] [address] [phone]       Follow up with Stan Headarl Gessner, MD. Schedule an appointment as soon as possible for a visit in 1 week.   Specialty:  Gastroenterology   Contact information:   [address] [place] [address] [phone]         Discharge Instructions  and  Discharge Medications      Discharge Orders   Future Appointments Provider Department Dept Phone   02/21/2014 2:00 PM [name], MD [facility] [phone]   04/09/2014 9:30 AM [name], MD [facility] [phone]   Future Orders Complete By Expires   Diet - low sodium heart healthy  As directed    Discharge instructions  As directed    Comments:     Follow with Primary MD Sanda Linger, MD in 7 days.  Resume Coumadin only once cleared by her family doctor and GI physician.   Get CBC, CMP, INR checked 7 days by Primary MD and again as instructed by your Primary MD.     Activity: As tolerated with Full fall precautions use walker/cane & assistance as needed   Disposition Home    Diet: Heart Healthy   For Heart failure patients - Check your Weight same time everyday, if you gain over 2 pounds, or you develop in leg swelling, experience more shortness of breath or chest pain, call your Primary MD immediately. Follow Cardiac Low  Salt Diet and 1.8 lit/day fluid restriction.   On your next visit with her primary care physician please Get Medicines reviewed and adjusted.  Please request your Prim.MD to go over all Hospital Tests and Procedure/Radiological results at the follow up, please get all Hospital records sent to your Prim MD by signing hospital release before you go home.   If you experience worsening of your admission symptoms, develop shortness of breath, life threatening emergency, suicidal or homicidal thoughts you must seek medical attention immediately by calling 911 or calling your MD immediately  if symptoms less severe.  You Must read complete instructions/literature along with all the possible adverse reactions/side effects for all the Medicines you take and that have been prescribed to you. Take any new Medicines after you have completely understood and accpet all the possible adverse reactions/side effects.   Do not drive and provide baby sitting services if your were admitted for syncope or siezures until you have seen by Primary MD or a Neurologist and advised to do so again.  Do not drive when taking Pain medications.    Do not take more than prescribed Pain, Sleep and Anxiety Medications  Special Instructions: If you have smoked or chewed Tobacco  in the last 2 yrs please stop smoking, stop any regular Alcohol  and or any Recreational drug use.  Wear Seat belts while driving.   Please note  You were cared for by a hospitalist during your hospital stay. If you have any questions about your discharge medications or the care you received while you were in the hospital after you are discharged, you can call the unit and asked to speak with the hospitalist on call if the hospitalist that took care of you is not available. Once you are discharged, your primary care physician will handle any further medical issues. Please note that NO REFILLS for any discharge medications will be authorized once you  are discharged, as it is imperative that you return to your primary care physician (or establish a relationship with a primary care physician if you do not have one) for your aftercare needs so that they can reassess your need for medications and monitor your lab values.   Increase activity slowly  As directed        Medication List    STOP taking these medications  warfarin 1 MG tablet  Commonly known as:  COUMADIN      TAKE these medications       aspirin EC 81 MG tablet  Take 81 mg by mouth daily.     CITRACAL CALCIUM+D 600-40-500 MG-MG-UNIT Tb24  Generic drug:  Calcium-Magnesium-Vitamin D  Take 1 tablet by mouth 2 (two) times daily.     CRESTOR 10 MG tablet  Generic drug:  rosuvastatin  Take 1 tablet by mouth  daily     fexofenadine 180 MG tablet  Commonly known as:  ALLEGRA  Take 180 mg by mouth at bedtime as needed (for allergies).     finasteride 5 MG tablet  Commonly known as:  PROSCAR  Take 1 tablet (5 mg total) by mouth at bedtime.     metoprolol tartrate 25 MG tablet  Commonly known as:  LOPRESSOR  Take 1 tablet (25 mg total) by mouth 2 (two) times daily.     nitroGLYCERIN 0.4 MG SL tablet  Commonly known as:  NITROSTAT  Place 0.4 mg under the tongue every 5 (five) minutes x 3 doses as needed for chest pain.     OCUVITE-LUTEIN PO  Take 1 tablet by mouth daily.     omeprazole 40 MG capsule  Commonly known as:  PRILOSEC  Take 40 mg by mouth daily.     polyethylene glycol packet  Commonly known as:  MIRALAX / GLYCOLAX  Take 17 g by mouth daily.     PROLIA 60 MG/ML Soln injection  Generic drug:  denosumab  Inject 60 mg into the skin every 6 (six) months.     RA MELATONIN/B-6 PO  Take 6 mg by mouth at bedtime.     terazosin 5 MG capsule  Commonly known as:  HYTRIN  Take 1 capsule (5 mg total) by mouth daily.     traZODone 100 MG tablet  Commonly known as:  DESYREL  Take 1 tablet (100 mg total) by mouth at bedtime.          Diet and  Activity recommendation: See Discharge Instructions above   Consults obtained - GI   Major procedures and Radiology Reports - PLEASE review detailed and final reports for all details, in brief -       Ct Abdomen Pelvis W Contrast  02/16/2014   CLINICAL DATA:  Rectal bleeding  EXAM: CT ABDOMEN AND PELVIS WITH CONTRAST  TECHNIQUE: Multidetector CT imaging of the abdomen and pelvis was performed using the standard protocol following bolus administration of intravenous contrast.  CONTRAST:  OMNIPAQUE IOHEXOL 300 MG/ML  SOLN  COMPARISON:  05/20/2010  FINDINGS: Lung bases shows atelectasis or infiltrate in left lower lobe posteriorly. Sagittal images of the spine shows degenerative changes thoracolumbar spine. There is prior vertebroplasty and compression deformity at L4 level.  Enhanced liver is unremarkable. The patient is status postcholecystectomy. Atherosclerotic calcifications of abdominal aorta, bilateral renal artery origin, SMA origin and bilateral iliac arteries are noted. Splenic artery calcifications are noted.  Kidneys are symmetrical in size and enhancement. There is a large cyst in upper pole of the left kidney measures 8 cm. A second cyst in upper pole of the left kidney measures 3.5 cm. No hydronephrosis or hydroureter.  Abundant stool noted in transverse colon. No small bowel obstruction. No aortic aneurysm.  The pancreas, spleen and adrenal glands are unremarkable.  Scattered diverticula are noted in descending colon. No adenopathy. No ascites or free air. Multiple sigmoid colon diverticula are noted. No definite evidence  of acute diverticulitis. There is moderate distension with liquid stool of distal sigmoid and rectosigmoid colon. No significant colonic wall thickening is noted. No definite colonic mass is identified.  The urinary bladder is unremarkable. Evaluation of the pelvis is limited by streak artifacts from right hip prosthesis.  IMPRESSION: 1. Moderate stool noted in  transverse colon. No small bowel or colonic obstruction. Scattered diverticula are noted descending colon. Multiple sigmoid colon diverticula. No definite evidence of acute diverticulitis. 2. Moderate distension of distal sigmoid and rectum with liquid stool. No thickening of colonic wall is identified. 3. No hydronephrosis or hydroureter.  Left renal cysts are noted. 4. Extensive atherosclerotic vascular calcifications. 5. Limited evaluation of the pelvis due to metallic artifacts from right hip prosthesis.   Electronically Signed   By: Natasha Mead M.D.   On: 02/16/2014 12:32   Dg Shoulder Left  01/24/2014   CLINICAL DATA:  Fall.  EXAM: LEFT SHOULDER - 2+ VIEW  COMPARISON:  None.  FINDINGS: Degenerative changes left shoulder. Calcification in the region supraspinatus tendon consistent consistent with calcific tendinitis. No evidence of fracture or dislocation. No evidence separation. Anterior inferior subluxation of the left shoulder cannot be excluded.  IMPRESSION: Degenerative change left shoulder. Anterior inferior subluxation of the left humeral head cannot be excluded.   Electronically Signed   By: Maisie Fus  Register   On: 01/24/2014 14:34    Micro Results      Recent Results (from the past 240 hour(s))  MRSA PCR SCREENING     Status: None   Collection Time    02/16/14  4:00 PM      Result Value Ref Range Status   MRSA by PCR NEGATIVE  NEGATIVE Final   Comment:            The GeneXpert MRSA Assay (FDA     approved for NASAL specimens     only), is one component of a     comprehensive MRSA colonization     surveillance program. It is not     intended to diagnose MRSA     infection nor to guide or     monitor treatment for     MRSA infections.     History of present illness and  Hospital Course:     Kindly see H&P for history of present illness and admission details, please review complete Labs, Consult reports and Test reports for all details in brief Dwayne [name], is a 80 y.o. male,  patient with history of severe diverticulosis, A. fibrillation on Coumadin, CAD post CABG, hypertension who was admitted hospital with episode of severe lower GI bleed consistent with his history of diverticulosis.    He required reversal of his Coumadin for with Vit K, 2 units of packed RBC transfusion, he was given bowel rest and IV fluids seen by GI, his H&H now remains stable and he is symptom free with no further episodes of lower GI bleed. His Coumadin has been held for now, he will resume his low-dose aspirin and follow with PCP and GI in the outpatient setting. Coumadin can be resumed if he remains clinically stable and GI clears him to do so. Will recommend outpatient GI and cardiology followup post discharge.    Atrial fibrillation. Goal will be rate control he will resume his home dose beta blocker and aspirin. Coumadin to be held as above.      CAD status post CABG. No acute issues continue beta blocker and aspirin for secondary prevention.  Sick Sinus syndrome status post pacemaker placement. No acute issues.    Mild delirium and weakness. Much better today, should resolve upon reaching home with surrounding familiar settings, will be discharged home with home PT RN and social work. Wife has been updated.      Today   Subjective:   Dwayne [name] today has no headache,no chest abdominal pain,no new weakness tingling or numbness, feels much better wants to go home today.    Objective:   Blood pressure 133/71, pulse 68, temperature 98.3 F (36.8 C), temperature source Oral, resp. rate 19, height 5\' 10"  (1.778 m), weight 71.8 kg (158 lb 4.6 oz), SpO2 98.00%.   Intake/Output Summary (Last 24 hours) at 02/19/14 0945 Last data filed at 02/19/14 0300  Gross per 24 hour  Intake    463 ml  Output   1825 ml  Net  -1362 ml    Exam Awake Alert, Oriented x1, No new F.N deficits, Normal affect Imbler.AT,PERRAL Supple Neck,No JVD, No cervical lymphadenopathy appriciated.    Symmetrical Chest wall movement, Good air movement bilaterally, CTAB RRR,No Gallops,Rubs or new Murmurs, No Parasternal Heave +ve B.Sounds, Abd Soft, Non tender, No organomegaly appriciated, No rebound -guarding or rigidity. No Cyanosis, Clubbing or edema, No new Rash or bruise  Data Review   CBC w Diff: Lab Results  Component Value Date   WBC 10.4 02/17/2014   WBC 10.0 04/28/2012   HGB 8.8* 02/19/2014   HGB 12.9* 04/28/2012   HCT 25.7* 02/19/2014   HCT 41.6* 04/28/2012   PLT 191 02/17/2014   LYMPHOPCT 18 02/16/2014   MONOPCT 10 02/16/2014   EOSPCT 5 02/16/2014   BASOPCT 1 02/16/2014    CMP: Lab Results  Component Value Date   NA 140 02/18/2014   K 3.8 02/19/2014   CL 106 02/18/2014   CO2 22 02/18/2014   BUN 10 02/18/2014   CREATININE 0.68 02/18/2014   PROT 6.3 02/16/2014   ALBUMIN 3.0* 02/16/2014   BILITOT 0.3 02/16/2014   ALKPHOS 70 02/16/2014   AST 13 02/16/2014   ALT 9 02/16/2014  .   Total Time in preparing paper work, data evaluation and todays exam - 35 minutes  Leroy Sea M.D on 02/19/2014 at 9:45 AM  [facility] Office  [phone]

## 2014-02-19 NOTE — Evaluation (Signed)
Physical Therapy Evaluation Patient Details Name: Dwayne [name] MRN: [id number] DOB: 12-08-1932 Today's Date: 02/19/2014 Time: 4098-1191 PT Time Calculation (min): 14 min  PT Assessment / Plan / Recommendation History of Present Illness  Dwayne [name] is a 80 y.o. male with a Past Medical History of atrial fibrillation on chronic Coumadin therapy, coronary artery disease status post CABG on aspirin, hypertension who presents today with the above noted complaint. The patient, he started noticing bright red blood per rectum the day before yesterday. Patient claims that yesterday had numerous bloody bowel movements-approximately 3-4 times a day. He thought that these bloody bowel movements would resolve on their own, however it continued overnight and he continued to have multiple episodes this morning. As a result patient presented to the emergency room for further evaluation   Clinical Impression  Pt limited by impaired cognition, per RN pt is at baseline cognitively.  Pt will need 24 hour supervision at home for safety with gait and mobility due to cognitive and balance impairments.  Recommend HHPT and 24 hour close supervision    PT Assessment  All further PT needs can be met in the next venue of care    Follow Up Recommendations  Home health PT, 24 hour supervision    Does the patient have the potential to tolerate intense rehabilitation      Barriers to Discharge        Equipment Recommendations  None recommended by PT    Recommendations for Other Services     Frequency      Precautions / Restrictions Precautions Precautions: Fall Precaution Comments: has sitter Restrictions Weight Bearing Restrictions: No   Pertinent Vitals/Pain No c/o pain      Mobility  Bed Mobility Overal bed mobility: Modified Independent Transfers Overall transfer level: Needs assistance Equipment used: Rolling walker (2 wheeled) Transfers: Sit to/from Stand Sit to Stand: Supervision General  transfer comment: cues for safety Ambulation/Gait Ambulation/Gait assistance: Supervision Ambulation Distance (Feet): 150 Feet Assistive device: Rolling walker (2 wheeled) Gait velocity interpretation: Below normal speed for age/gender General Gait Details: pt requires supervision for safety with obstacles and distraction in hallway, delayed balance reactions noted with turns, head turns    Exercises     PT Diagnosis: Generalized weakness;Difficulty walking  PT Problem List: Decreased activity tolerance;Decreased balance;Decreased mobility PT Treatment Interventions:       PT Goals(Current goals can be found in the care plan section)    Visit Information  Last PT Received On: 02/19/14 Assistance Needed: +1 History of Present Illness: Dwayne [name] is a 80 y.o. male with a Past Medical History of atrial fibrillation on chronic Coumadin therapy, coronary artery disease status post CABG on aspirin, hypertension who presents today with the above noted complaint. The patient, he started noticing bright red blood per rectum the day before yesterday. Patient claims that yesterday had numerous bloody bowel movements-approximately 3-4 times a day. He thought that these bloody bowel movements would resolve on their own, however it continued overnight and he continued to have multiple episodes this morning. As a result patient presented to the emergency room for further evaluation        Prior [place] expects to be discharged to:: Private residence Living Arrangements: Spouse/significant other Available Help at Discharge: Family;Available 24 hours/day Type of Home: House Home Access: Ramped entrance Home Layout: One level Home Equipment:  (rollator) Prior Function Level of Independence: Independent with assistive device(s) Communication Communication: No difficulties    Cognition  Cognition Arousal/Alertness: Awake/alert Behavior During Therapy: WFL for  tasks assessed/performed Overall Cognitive Status: History of cognitive impairments - at baseline (per RN pt is at cognitive baseline)    Extremity/Trunk Assessment Upper Extremity Assessment Upper Extremity Assessment: Generalized weakness Lower Extremity Assessment Lower Extremity Assessment: Generalized weakness Cervical / Trunk Assessment Cervical / Trunk Assessment: Kyphotic   Balance    End of Session PT - End of Session Activity Tolerance: Patient tolerated treatment well Patient left: in chair;with nursing/sitter in room;with call bell/phone within reach Nurse Communication: Mobility status  GP     DONAWERTH,KAREN 02/19/2014, 8:38 AM

## 2014-02-19 NOTE — Progress Notes (Signed)
    Progress Note   Subjective  feels okay, no bleeding this am.    Objective   Vital signs in last 24 hours: Temp:  [97.6 F (36.4 C)-98.3 F (36.8 C)] 98.3 F (36.8 C) (03/03 0620) Pulse Rate:  [60-68] 68 (03/03 0620) Resp:  [12-19] 19 (03/03 0620) BP: (128-151)/(62-112) 133/71 mmHg (03/03 0620) SpO2:  [96 %-99 %] 98 % (03/03 0620) Weight:  [158 lb 4.6 oz (71.8 kg)] 158 lb 4.6 oz (71.8 kg) (03/02 2327) Last BM Date: 02/18/14 General:    white male in NAD Heart:  Regular rate and rhythm;  Murmur present Lungs: Respirations even and unlabored, lungs CTA bilaterally Abdomen:  Soft, nontender and nondistended. Normal bowel sounds. Extremities:  Without edema. Neurologic:  Alert and oriented,  grossly normal neurologically. Psych:  Cooperative. Normal mood and affect.  Lab Results:  Recent Labs  02/16/14 1535 02/17/14 0305  02/17/14 1855 02/18/14 0340 02/19/14 0419  WBC 7.4 10.4  --   --   --   --   HGB 7.6* 8.5*  < > 9.4* 8.7* 8.8*  HCT 23.0* 25.5*  < > 27.5* 25.7* 25.7*  PLT 183 191  --   --   --   --   < > = values in this interval not displayed. BMET  Recent Labs  02/16/14 1024 02/17/14 0305 02/18/14 0340 02/19/14 0419  NA 143 148* 140  --   K 4.0 3.8 3.3* 3.8  CL 107 112 106  --   CO2  --  22 22  --   GLUCOSE 111* 95 109*  --   BUN 18 15 10   --   CREATININE 0.90 0.76 0.68  --   CALCIUM  --  7.6* 7.7*  --     Recent Labs  02/17/14 0305 02/18/14 0340  LABPROT 20.3* 15.6*  INR 1.79* 1.27     Assessment / Plan:   1. Hematochezia, presumed diverticular hemorrhage. CTscan does show diverticular disease. No bleeding this am. He apparently had a colonoscopy in 2010 which I am trying to track down  Bleeding has stopped for now. He is on heart healthy diet.  2. Anemia of acute blood loss. Hgb in 12 range in December 2014, it dropped to 7.6 on 02/16/14 at which time 2 units of blood were transfused. Hgb has fluctuated between id 8 and mid 9 range since.    3. Afib / pacemaker / chronic anticoagulation, INR 1.27  4. Dementia.    LOS: 3 days   Dwayne [name]  02/19/2014, 9:36 AM  GI ATTENDING  Interval history and laboratories reviewed. Patient seen and examined. No GI bleeding. Continue to monitor stools, hemoglobin, and INR. Advancing diet. Reevaluate tomorrow. No bleeding, and stools look more normal, could go home.  Dwayne [name], Jr., M.D. [facility] Division of Gastroenterology

## 2014-02-19 NOTE — Care Management Note (Addendum)
CARE MANAGEMENT NOTE 02/19/2014  Patient:  Dwayne [name],Dwayne [name]   Account Number:  [id number]  Date Initiated:  02/19/2014  Documentation initiated by:  ,  Subjective/Objective Assessment:   Order for HHRN, PT and social work.     Action/Plan:   02/19/2014 Pt active with [facility] for HHRN and HHPT will add social work, [facility] notified.   Anticipated DC Date:  02/20/2014   Anticipated DC Plan:  HOME W HOME HEALTH SERVICES         PAC Choice  HOME HEALTH   Choice offered to / List presented to:  C-1 Patient        HH arranged  HH-1 RN  HH-2 PT  HH-6 SOCIAL WORKER      HH agency  [facility]   Status of service:  Completed, signed off Medicare Important Message given?   (If response is "NO", the following Medicare IM given date fields will be blank) Date Medicare IM given:   Date Additional Medicare IM given:    Discharge Disposition:  HOME W HOME HEALTH SERVICES  Per UR Regulation:    If discussed at Long Length of Stay Meetings, dates discussed:    Comments:  02/19/14 Met with pt to discuss HH services , pt is active with [facility] and wishes to continue services with that agency. [facility] notified and social work added to home services requested. Attempted to reach pt wife with pt permission re HH, however was unable to reach her at any of our numbers message left at home and wife informed of possible D/C.   RN MPH, case manager, [phone] Addem: Pt has no further DME needs as he is s/p hip fx and rehab so has DME needs for that process.    RN MPH, case manager, [phone] 

## 2014-02-20 DIAGNOSIS — I2510 Atherosclerotic heart disease of native coronary artery without angina pectoris: Secondary | ICD-10-CM | POA: Diagnosis not present

## 2014-02-20 DIAGNOSIS — M25559 Pain in unspecified hip: Secondary | ICD-10-CM | POA: Diagnosis not present

## 2014-02-20 DIAGNOSIS — I4891 Unspecified atrial fibrillation: Secondary | ICD-10-CM | POA: Diagnosis not present

## 2014-02-20 DIAGNOSIS — S81009A Unspecified open wound, unspecified knee, initial encounter: Secondary | ICD-10-CM | POA: Diagnosis not present

## 2014-02-20 DIAGNOSIS — S72009D Fracture of unspecified part of neck of unspecified femur, subsequent encounter for closed fracture with routine healing: Secondary | ICD-10-CM | POA: Diagnosis not present

## 2014-02-20 DIAGNOSIS — I10 Essential (primary) hypertension: Secondary | ICD-10-CM | POA: Diagnosis not present

## 2014-02-20 NOTE — Telephone Encounter (Signed)
[facility] aware she will need to call dr Sanda Lingerthomas jones. We were not involved in the pts recent hostalization. She voiced understanding.

## 2014-02-20 NOTE — Telephone Encounter (Signed)
Follow up     Home health in the home now   Blood pressure today left arm  102/55 . Right  98/58 .   Hr 70-60 range .   Standing  102/60 - right arm.    1. Should metoprolol & hytrin  Be on hold tonight.   2. No dizziness, no bloody stool.

## 2014-02-21 ENCOUNTER — Encounter: Payer: Self-pay | Admitting: Internal Medicine

## 2014-02-21 ENCOUNTER — Ambulatory Visit (INDEPENDENT_AMBULATORY_CARE_PROVIDER_SITE_OTHER): Payer: Medicare Other | Admitting: Internal Medicine

## 2014-02-21 ENCOUNTER — Telehealth: Payer: Self-pay | Admitting: Internal Medicine

## 2014-02-21 ENCOUNTER — Other Ambulatory Visit (INDEPENDENT_AMBULATORY_CARE_PROVIDER_SITE_OTHER): Payer: Medicare Other

## 2014-02-21 VITALS — BP 120/70 | HR 81 | Temp 97.2°F | Resp 16 | Ht 69.0 in | Wt 162.0 lb

## 2014-02-21 DIAGNOSIS — I25810 Atherosclerosis of coronary artery bypass graft(s) without angina pectoris: Secondary | ICD-10-CM

## 2014-02-21 DIAGNOSIS — D62 Acute posthemorrhagic anemia: Secondary | ICD-10-CM | POA: Diagnosis not present

## 2014-02-21 DIAGNOSIS — R7309 Other abnormal glucose: Secondary | ICD-10-CM

## 2014-02-21 DIAGNOSIS — Z5181 Encounter for therapeutic drug level monitoring: Secondary | ICD-10-CM

## 2014-02-21 DIAGNOSIS — D689 Coagulation defect, unspecified: Secondary | ICD-10-CM

## 2014-02-21 DIAGNOSIS — S81009A Unspecified open wound, unspecified knee, initial encounter: Secondary | ICD-10-CM | POA: Diagnosis not present

## 2014-02-21 DIAGNOSIS — I4891 Unspecified atrial fibrillation: Secondary | ICD-10-CM

## 2014-02-21 DIAGNOSIS — I2510 Atherosclerotic heart disease of native coronary artery without angina pectoris: Secondary | ICD-10-CM | POA: Diagnosis not present

## 2014-02-21 DIAGNOSIS — D509 Iron deficiency anemia, unspecified: Secondary | ICD-10-CM | POA: Insufficient documentation

## 2014-02-21 DIAGNOSIS — M25559 Pain in unspecified hip: Secondary | ICD-10-CM | POA: Diagnosis not present

## 2014-02-21 DIAGNOSIS — K922 Gastrointestinal hemorrhage, unspecified: Secondary | ICD-10-CM

## 2014-02-21 DIAGNOSIS — S72009D Fracture of unspecified part of neck of unspecified femur, subsequent encounter for closed fracture with routine healing: Secondary | ICD-10-CM | POA: Diagnosis not present

## 2014-02-21 DIAGNOSIS — I10 Essential (primary) hypertension: Secondary | ICD-10-CM | POA: Diagnosis not present

## 2014-02-21 LAB — CBC WITH DIFFERENTIAL/PLATELET
BASOS PCT: 1.6 % (ref 0.0–3.0)
Basophils Absolute: 0.1 10*3/uL (ref 0.0–0.1)
EOS PCT: 5.7 % — AB (ref 0.0–5.0)
Eosinophils Absolute: 0.5 10*3/uL (ref 0.0–0.7)
HEMATOCRIT: 27.8 % — AB (ref 39.0–52.0)
HEMOGLOBIN: 9.1 g/dL — AB (ref 13.0–17.0)
LYMPHS ABS: 1.4 10*3/uL (ref 0.7–4.0)
Lymphocytes Relative: 15.6 % (ref 12.0–46.0)
MCHC: 32.8 g/dL (ref 30.0–36.0)
MCV: 91.2 fl (ref 78.0–100.0)
MONO ABS: 0.9 10*3/uL (ref 0.1–1.0)
Monocytes Relative: 9.7 % (ref 3.0–12.0)
Neutro Abs: 6.1 10*3/uL (ref 1.4–7.7)
Neutrophils Relative %: 67.4 % (ref 43.0–77.0)
Platelets: 294 10*3/uL (ref 150.0–400.0)
RBC: 3.05 Mil/uL — AB (ref 4.22–5.81)
RDW: 15.3 % — ABNORMAL HIGH (ref 11.5–14.6)
WBC: 9.1 10*3/uL (ref 4.5–10.5)

## 2014-02-21 LAB — BASIC METABOLIC PANEL
BUN: 15 mg/dL (ref 6–23)
CALCIUM: 8.8 mg/dL (ref 8.4–10.5)
CO2: 23 mEq/L (ref 19–32)
Chloride: 110 mEq/L (ref 96–112)
Creatinine, Ser: 0.9 mg/dL (ref 0.4–1.5)
GFR: 90.98 mL/min (ref >60.00)
GLUCOSE: 83 mg/dL (ref 70–99)
POTASSIUM: 3.8 meq/L (ref 3.5–5.1)
Sodium: 138 mEq/L (ref 135–145)

## 2014-02-21 LAB — PROTIME-INR
INR: 1.4 ratio — AB (ref 0.8–1.0)
Prothrombin Time: 14.9 s — ABNORMAL HIGH (ref 10.2–12.4)

## 2014-02-21 LAB — HEMOGLOBIN A1C: Hgb A1c MFr Bld: 5.7 % (ref 4.6–6.5)

## 2014-02-21 LAB — IBC PANEL
Iron: 9 ug/dL — ABNORMAL LOW (ref 42–165)
Saturation Ratios: 2.6 % — ABNORMAL LOW (ref 20.0–50.0)
TRANSFERRIN: 250.2 mg/dL (ref 212.0–360.0)

## 2014-02-21 LAB — FERRITIN: Ferritin: 20.2 ng/mL — ABNORMAL LOW (ref 22.0–322.0)

## 2014-02-21 MED ORDER — FERROUS SULFATE 325 (65 FE) MG PO TABS: 325.0000 mg | ORAL_TABLET | Freq: Two times a day (BID) | ORAL | Status: DC

## 2014-02-21 NOTE — Assessment & Plan Note (Addendum)
He has good rate and rhythm control He has held the coumadin since he had the lower GI bleed He is not willing to restart coumadin at this time due to his fears about recurrent bleeding

## 2014-02-21 NOTE — Assessment & Plan Note (Signed)
This appears to have resolved He wants to see GI so I have ordered a referral

## 2014-02-21 NOTE — Assessment & Plan Note (Signed)
He has not had a recurrence of rectal bleeding since his discharge His H/H has improved

## 2014-02-21 NOTE — Assessment & Plan Note (Signed)
He will start iron replacement therapy

## 2014-02-21 NOTE — Progress Notes (Signed)
   Subjective:    Patient ID: Dwayne [name], male    DOB: 15-May-1932, 80 y.o.   MRN: [id number]  Anemia Presents for follow-up visit. Symptoms include pallor. There has been no abdominal pain, anorexia, bruising/bleeding easily, confusion, fever, leg swelling, light-headedness, malaise/fatigue, palpitations, paresthesias, pica or weight loss. Signs of blood loss that are not present include hematemesis, hematochezia and melena. Past treatments include parenteral vitamin B12. Past medical history includes recent illness.      Review of Systems  Constitutional: Negative.  Negative for fever, chills, weight loss, malaise/fatigue, diaphoresis and fatigue.  HENT: Negative.   Eyes: Negative.   Respiratory: Negative.  Negative for cough, choking, chest tightness, shortness of breath and stridor.   Cardiovascular: Negative.  Negative for chest pain, palpitations and leg swelling.  Gastrointestinal: Negative.  Negative for nausea, abdominal pain, diarrhea, constipation, blood in stool, melena, hematochezia, anorexia and hematemesis.  Endocrine: Negative.   Genitourinary: Negative.   Musculoskeletal: Negative.   Skin: Positive for pallor. Negative for color change, rash and wound.  Allergic/Immunologic: Negative.   Neurological: Negative.  Negative for dizziness, tremors, weakness, light-headedness, numbness and paresthesias.  Hematological: Negative.  Negative for adenopathy. Does not bruise/bleed easily.  Psychiatric/Behavioral: Negative.  Negative for confusion.       Objective:   Physical Exam  Vitals reviewed. Constitutional: He is oriented to person, place, and time. He appears well-developed and well-nourished. No distress.  HENT:  Head: Normocephalic and atraumatic.  Mouth/Throat: Mucous membranes are pale, not dry and not cyanotic. No oropharyngeal exudate, posterior oropharyngeal edema, posterior oropharyngeal erythema or tonsillar abscesses.  Eyes: Conjunctivae are normal. Right eye  exhibits no discharge. Left eye exhibits no discharge. No scleral icterus.  Neck: Normal range of motion. Neck supple. No JVD present. No tracheal deviation present. No thyromegaly present.  Cardiovascular: Normal rate, regular rhythm and intact distal pulses.  Exam reveals no gallop and no friction rub.   Murmur heard. Pulmonary/Chest: Effort normal and breath sounds normal. No stridor. No respiratory distress. He has no wheezes. He has no rales. He exhibits no tenderness.  Abdominal: Soft. Bowel sounds are normal. He exhibits no distension and no mass. There is no tenderness. There is no rebound and no guarding.  Musculoskeletal: Normal range of motion. He exhibits edema (trace pitting edema in BLE). He exhibits no tenderness.  Lymphadenopathy:    He has no cervical adenopathy.  Neurological: He is oriented to person, place, and time.  Skin: Skin is warm and dry. No rash noted. He is not diaphoretic. No erythema. No pallor.    Lab Results  Component Value Date   WBC 10.4 02/17/2014   HGB 8.8* 02/19/2014   HCT 25.7* 02/19/2014   PLT 191 02/17/2014   GLUCOSE 109* 02/18/2014   CHOL 137 06/20/2013   TRIG 104.0 06/20/2013   HDL 48.50 06/20/2013   LDLCALC 68 06/20/2013   ALT 9 02/16/2014   AST 13 02/16/2014   NA 140 02/18/2014   K 3.8 02/19/2014   CL 106 02/18/2014   CREATININE 0.68 02/18/2014   BUN 10 02/18/2014   CO2 22 02/18/2014   TSH 0.90 02/22/2012   INR 1.27 02/18/2014   HGBA1C 6.2 03/14/2012        Assessment & Plan:

## 2014-02-21 NOTE — Patient Instructions (Signed)

## 2014-02-21 NOTE — Telephone Encounter (Signed)
Pt request referral for GI doctor ASAP due to rectal bleeding. Please advise.

## 2014-02-22 DIAGNOSIS — M25559 Pain in unspecified hip: Secondary | ICD-10-CM | POA: Diagnosis not present

## 2014-02-22 DIAGNOSIS — S81009A Unspecified open wound, unspecified knee, initial encounter: Secondary | ICD-10-CM | POA: Diagnosis not present

## 2014-02-22 DIAGNOSIS — S72009D Fracture of unspecified part of neck of unspecified femur, subsequent encounter for closed fracture with routine healing: Secondary | ICD-10-CM | POA: Diagnosis not present

## 2014-02-22 DIAGNOSIS — I2510 Atherosclerotic heart disease of native coronary artery without angina pectoris: Secondary | ICD-10-CM | POA: Diagnosis not present

## 2014-02-22 DIAGNOSIS — I4891 Unspecified atrial fibrillation: Secondary | ICD-10-CM | POA: Diagnosis not present

## 2014-02-22 DIAGNOSIS — I10 Essential (primary) hypertension: Secondary | ICD-10-CM | POA: Diagnosis not present

## 2014-02-25 ENCOUNTER — Telehealth: Payer: Self-pay | Admitting: *Deleted

## 2014-02-25 DIAGNOSIS — I2510 Atherosclerotic heart disease of native coronary artery without angina pectoris: Secondary | ICD-10-CM | POA: Diagnosis not present

## 2014-02-25 DIAGNOSIS — M25559 Pain in unspecified hip: Secondary | ICD-10-CM | POA: Diagnosis not present

## 2014-02-25 DIAGNOSIS — I10 Essential (primary) hypertension: Secondary | ICD-10-CM | POA: Diagnosis not present

## 2014-02-25 DIAGNOSIS — S81009A Unspecified open wound, unspecified knee, initial encounter: Secondary | ICD-10-CM | POA: Diagnosis not present

## 2014-02-25 DIAGNOSIS — S72009D Fracture of unspecified part of neck of unspecified femur, subsequent encounter for closed fracture with routine healing: Secondary | ICD-10-CM | POA: Diagnosis not present

## 2014-02-25 DIAGNOSIS — S81809A Unspecified open wound, unspecified lower leg, initial encounter: Secondary | ICD-10-CM | POA: Diagnosis not present

## 2014-02-25 DIAGNOSIS — I4891 Unspecified atrial fibrillation: Secondary | ICD-10-CM | POA: Diagnosis not present

## 2014-02-25 NOTE — Telephone Encounter (Signed)
[facility] with [facility] on [facility] status---newly auscultated RLL crackles.  No cough, no COB, no CP; taught TCDB; afebrile and VS WNL & stable.  If further orders needed, please advise.  CB# [phone]

## 2014-02-26 DIAGNOSIS — I4891 Unspecified atrial fibrillation: Secondary | ICD-10-CM | POA: Diagnosis not present

## 2014-02-26 DIAGNOSIS — S72009D Fracture of unspecified part of neck of unspecified femur, subsequent encounter for closed fracture with routine healing: Secondary | ICD-10-CM | POA: Diagnosis not present

## 2014-02-26 DIAGNOSIS — I2510 Atherosclerotic heart disease of native coronary artery without angina pectoris: Secondary | ICD-10-CM | POA: Diagnosis not present

## 2014-02-26 DIAGNOSIS — I10 Essential (primary) hypertension: Secondary | ICD-10-CM | POA: Diagnosis not present

## 2014-02-26 DIAGNOSIS — S72043B Displaced fracture of base of neck of unspecified femur, initial encounter for open fracture type I or II: Secondary | ICD-10-CM | POA: Diagnosis not present

## 2014-02-26 DIAGNOSIS — M25559 Pain in unspecified hip: Secondary | ICD-10-CM | POA: Diagnosis not present

## 2014-02-26 DIAGNOSIS — S81009A Unspecified open wound, unspecified knee, initial encounter: Secondary | ICD-10-CM | POA: Diagnosis not present

## 2014-02-27 ENCOUNTER — Ambulatory Visit (INDEPENDENT_AMBULATORY_CARE_PROVIDER_SITE_OTHER): Payer: Medicare Other | Admitting: Gastroenterology

## 2014-02-27 ENCOUNTER — Encounter: Payer: Self-pay | Admitting: Gastroenterology

## 2014-02-27 VITALS — BP 104/60 | HR 66 | Ht 69.0 in | Wt 172.3 lb

## 2014-02-27 DIAGNOSIS — I25810 Atherosclerosis of coronary artery bypass graft(s) without angina pectoris: Secondary | ICD-10-CM | POA: Diagnosis not present

## 2014-02-27 DIAGNOSIS — K922 Gastrointestinal hemorrhage, unspecified: Secondary | ICD-10-CM | POA: Diagnosis not present

## 2014-02-27 DIAGNOSIS — D62 Acute posthemorrhagic anemia: Secondary | ICD-10-CM | POA: Diagnosis not present

## 2014-02-27 NOTE — Progress Notes (Signed)
02/27/2014 Dwayne [name] [id number] December 27, 1931   History of Present Illness:  This is a very pleasant 80 year old male who is here today with his wife and their caregiver. He was recently hospitalized for a gastrointestinal bleed, which was presumed to be diverticular in origin. He apparently had a colonoscopy in 2010, however, we have not been able to obtain those records. It was reportedly "normal" per the patient and his wife. During hospitalization his hemoglobin was down to 7.6 g from his baseline of 12 g in December 2014. He did receive 2 units of packed blood cells. Upon discharge his hemoglobin was 8.8 g and recheck of that last week was stable 9.1 g.  Patient is here today for follow-up.  His PCP is starting him on iron supplements and they are waiting to receive those in the mail.  He denies any further bleeding since his hospital discharge. His wife states that she will try to look for any colonoscopy records at home or try to see at least who performed the procedure. Patient was previously on Coumadin for atrial fibrillation and that is still on hold. He has an appt with Dr. Jens Som at the end of April, but we are going to try to move that appointment up for him so he can discuss the Coumadin situation.  I do not think that we have any problems with him restarting that medication if they believe that the risk is too high to remain off of it with his atrial fibrillation.   Current Medications, Allergies, Past Medical History, Past Surgical History, Family History and Social History were reviewed in [employer] record.   Physical Exam: BP 104/60  Pulse 66  Ht 5\' 9"  (1.753 m)  Wt 172 lb 4.8 oz (78.155 kg)  BMI 25.43 kg/m2 General:  Elderly white male in no acute distress Head: Normocephalic and atraumatic Eyes:  Sclerae anicteric, conjunctiva pink  Ears: Normal auditory acuity Lungs: Clear throughout to auscultation Heart: Regular rate and rhythm. Abdomen: Soft,  non-distended.  Normal bowel sounds.  Non-tender. Musculoskeletal: Symmetrical with no gross deformities  Extremities: No edema  Neurological: Alert oriented x 4, grossly non-focal Psychological:  Alert and cooperative. Normal mood and affect  Assessment and Recommendations: -Lower GI bleed with acute blood loss anemia:  Recently hospitalized for this. Likely diverticular in origin. Patient has undergone colonoscopy in the past according to his report and we are trying to track down those results.  His PCP does not have these records but only report from the patient that the colonoscopy was "normal" in 2010.  Dr. Marina Goodell would really like to see that report of those results if possible. The patient's wife will go through their records at home and see if she can find it or at least who performed the procedure so we could try to obtain the records.  In the interim, we will not schedule any procedure, but just recommend continued observation and periodic monitoring of hemoglobin.  His hemoglobin is stable at 9.1 g in was 8.8 g on hospital discharge. Dr. Yetta Barre, his PCP, is starting him on iron supplements.  His Coumadin is still on hold and he is nervous to restart this medication. He had an appointment with Dr. Jens Som at the end of April, however, we have moved up that appointment to see one of the PAs within the next couple of weeks. If they think that the risk of being off Coumadin is too high, then I do not think we have any  issues with him restarting it with close observation.

## 2014-02-27 NOTE — Progress Notes (Signed)
Agree with assessment and plans. Would be helpful to see prior colonoscopy report

## 2014-02-27 NOTE — Patient Instructions (Addendum)
You have an appointment with [facility] on 03-13-2014 at 915 am with Lucile Craterana, Dunn PA-C Call tomorrow early morning and see if there is an opening for a sooner appointment.  If you could please find your Colonoscopy report at home and bring it to the office so we can scan it in.

## 2014-02-28 ENCOUNTER — Telehealth: Payer: Self-pay | Admitting: Gastroenterology

## 2014-02-28 DIAGNOSIS — I4891 Unspecified atrial fibrillation: Secondary | ICD-10-CM | POA: Diagnosis not present

## 2014-02-28 DIAGNOSIS — S81009A Unspecified open wound, unspecified knee, initial encounter: Secondary | ICD-10-CM | POA: Diagnosis not present

## 2014-02-28 DIAGNOSIS — I2510 Atherosclerotic heart disease of native coronary artery without angina pectoris: Secondary | ICD-10-CM | POA: Diagnosis not present

## 2014-02-28 DIAGNOSIS — M25559 Pain in unspecified hip: Secondary | ICD-10-CM | POA: Diagnosis not present

## 2014-02-28 DIAGNOSIS — I10 Essential (primary) hypertension: Secondary | ICD-10-CM | POA: Diagnosis not present

## 2014-02-28 DIAGNOSIS — S72009D Fracture of unspecified part of neck of unspecified femur, subsequent encounter for closed fracture with routine healing: Secondary | ICD-10-CM | POA: Diagnosis not present

## 2014-03-01 NOTE — Telephone Encounter (Signed)
Spoke with patient's wife, per patient's wife [facility]. Patient stated that they do not have a copy of it. I advised patient they may have to assign a release for us to get the records. Patient's wife said if so then she will call. Their office is closed now. Will call back Monday at [phone]

## 2014-03-04 ENCOUNTER — Telehealth: Payer: Self-pay | Admitting: *Deleted

## 2014-03-04 NOTE — Telephone Encounter (Signed)
I called [facility] and patient did have a colonoscopy there. Patient will need release signed for the records. LMOM for patient to call office back

## 2014-03-04 NOTE — Telephone Encounter (Signed)
Patient called back and wanted me to talk to his wife because he cannot hear good. I advised patient's wife that Dr. Jennye BoroughsMedoff's office is requiring a medical release to get the colonoscopy report. Patient's wife stated that she will get colonoscopy report.

## 2014-03-05 ENCOUNTER — Ambulatory Visit: Payer: Medicare Other | Admitting: Internal Medicine

## 2014-03-05 DIAGNOSIS — I4891 Unspecified atrial fibrillation: Secondary | ICD-10-CM | POA: Diagnosis not present

## 2014-03-05 DIAGNOSIS — I10 Essential (primary) hypertension: Secondary | ICD-10-CM | POA: Diagnosis not present

## 2014-03-05 DIAGNOSIS — S72009D Fracture of unspecified part of neck of unspecified femur, subsequent encounter for closed fracture with routine healing: Secondary | ICD-10-CM | POA: Diagnosis not present

## 2014-03-05 DIAGNOSIS — M25559 Pain in unspecified hip: Secondary | ICD-10-CM | POA: Diagnosis not present

## 2014-03-05 DIAGNOSIS — I2510 Atherosclerotic heart disease of native coronary artery without angina pectoris: Secondary | ICD-10-CM | POA: Diagnosis not present

## 2014-03-05 DIAGNOSIS — S81809A Unspecified open wound, unspecified lower leg, initial encounter: Secondary | ICD-10-CM | POA: Diagnosis not present

## 2014-03-05 DIAGNOSIS — S81009A Unspecified open wound, unspecified knee, initial encounter: Secondary | ICD-10-CM | POA: Diagnosis not present

## 2014-03-06 DIAGNOSIS — S91009A Unspecified open wound, unspecified ankle, initial encounter: Secondary | ICD-10-CM | POA: Diagnosis not present

## 2014-03-06 DIAGNOSIS — I10 Essential (primary) hypertension: Secondary | ICD-10-CM | POA: Diagnosis not present

## 2014-03-06 DIAGNOSIS — M25559 Pain in unspecified hip: Secondary | ICD-10-CM | POA: Diagnosis not present

## 2014-03-06 DIAGNOSIS — I2510 Atherosclerotic heart disease of native coronary artery without angina pectoris: Secondary | ICD-10-CM | POA: Diagnosis not present

## 2014-03-06 DIAGNOSIS — S72009D Fracture of unspecified part of neck of unspecified femur, subsequent encounter for closed fracture with routine healing: Secondary | ICD-10-CM | POA: Diagnosis not present

## 2014-03-06 DIAGNOSIS — S81009A Unspecified open wound, unspecified knee, initial encounter: Secondary | ICD-10-CM | POA: Diagnosis not present

## 2014-03-06 DIAGNOSIS — I4891 Unspecified atrial fibrillation: Secondary | ICD-10-CM | POA: Diagnosis not present

## 2014-03-07 DIAGNOSIS — M25559 Pain in unspecified hip: Secondary | ICD-10-CM | POA: Diagnosis not present

## 2014-03-07 DIAGNOSIS — I4891 Unspecified atrial fibrillation: Secondary | ICD-10-CM | POA: Diagnosis not present

## 2014-03-07 DIAGNOSIS — I10 Essential (primary) hypertension: Secondary | ICD-10-CM | POA: Diagnosis not present

## 2014-03-07 DIAGNOSIS — I2510 Atherosclerotic heart disease of native coronary artery without angina pectoris: Secondary | ICD-10-CM | POA: Diagnosis not present

## 2014-03-07 DIAGNOSIS — S81009A Unspecified open wound, unspecified knee, initial encounter: Secondary | ICD-10-CM | POA: Diagnosis not present

## 2014-03-07 DIAGNOSIS — S72009D Fracture of unspecified part of neck of unspecified femur, subsequent encounter for closed fracture with routine healing: Secondary | ICD-10-CM | POA: Diagnosis not present

## 2014-03-08 DIAGNOSIS — I4891 Unspecified atrial fibrillation: Secondary | ICD-10-CM | POA: Diagnosis not present

## 2014-03-08 DIAGNOSIS — S81009A Unspecified open wound, unspecified knee, initial encounter: Secondary | ICD-10-CM | POA: Diagnosis not present

## 2014-03-08 DIAGNOSIS — I2510 Atherosclerotic heart disease of native coronary artery without angina pectoris: Secondary | ICD-10-CM | POA: Diagnosis not present

## 2014-03-08 DIAGNOSIS — M25559 Pain in unspecified hip: Secondary | ICD-10-CM | POA: Diagnosis not present

## 2014-03-08 DIAGNOSIS — I10 Essential (primary) hypertension: Secondary | ICD-10-CM | POA: Diagnosis not present

## 2014-03-08 DIAGNOSIS — S72009D Fracture of unspecified part of neck of unspecified femur, subsequent encounter for closed fracture with routine healing: Secondary | ICD-10-CM | POA: Diagnosis not present

## 2014-03-12 ENCOUNTER — Encounter: Payer: Self-pay | Admitting: Physician Assistant

## 2014-03-12 DIAGNOSIS — I519 Heart disease, unspecified: Secondary | ICD-10-CM | POA: Insufficient documentation

## 2014-03-12 DIAGNOSIS — I2510 Atherosclerotic heart disease of native coronary artery without angina pectoris: Secondary | ICD-10-CM | POA: Insufficient documentation

## 2014-03-12 DIAGNOSIS — I495 Sick sinus syndrome: Secondary | ICD-10-CM | POA: Insufficient documentation

## 2014-03-12 DIAGNOSIS — K5790 Diverticulosis of intestine, part unspecified, without perforation or abscess without bleeding: Secondary | ICD-10-CM | POA: Insufficient documentation

## 2014-03-12 NOTE — Progress Notes (Signed)
   This encounter was created in error - please disregard. The patient was [facility] when they were scheduled under [facility]. Jonathan Kirkendoll PA-C

## 2014-03-13 ENCOUNTER — Ambulatory Visit (INDEPENDENT_AMBULATORY_CARE_PROVIDER_SITE_OTHER): Payer: Medicare Other | Admitting: Physician Assistant

## 2014-03-13 ENCOUNTER — Encounter: Payer: Medicare Other | Admitting: Physician Assistant

## 2014-03-13 ENCOUNTER — Other Ambulatory Visit: Payer: Self-pay | Admitting: Cardiology

## 2014-03-13 ENCOUNTER — Encounter: Payer: Self-pay | Admitting: Physician Assistant

## 2014-03-13 VITALS — BP 120/60 | HR 79 | Ht 70.0 in | Wt 172.3 lb

## 2014-03-13 DIAGNOSIS — I2510 Atherosclerotic heart disease of native coronary artery without angina pectoris: Secondary | ICD-10-CM

## 2014-03-13 DIAGNOSIS — K922 Gastrointestinal hemorrhage, unspecified: Secondary | ICD-10-CM | POA: Diagnosis not present

## 2014-03-13 DIAGNOSIS — R0602 Shortness of breath: Secondary | ICD-10-CM

## 2014-03-13 DIAGNOSIS — I495 Sick sinus syndrome: Secondary | ICD-10-CM

## 2014-03-13 DIAGNOSIS — I25810 Atherosclerosis of coronary artery bypass graft(s) without angina pectoris: Secondary | ICD-10-CM

## 2014-03-13 DIAGNOSIS — Z950 Presence of cardiac pacemaker: Secondary | ICD-10-CM

## 2014-03-13 DIAGNOSIS — I4891 Unspecified atrial fibrillation: Secondary | ICD-10-CM

## 2014-03-13 DIAGNOSIS — I519 Heart disease, unspecified: Secondary | ICD-10-CM

## 2014-03-13 LAB — CBC
HCT: 25.7 % — ABNORMAL LOW (ref 39.0–52.0)
Hemoglobin: 8.2 g/dL — ABNORMAL LOW (ref 13.0–17.0)
MCH: 27.6 pg (ref 26.0–34.0)
MCHC: 31.9 g/dL (ref 30.0–36.0)
MCV: 86.5 fL (ref 78.0–100.0)
Platelets: 230 10*3/uL (ref 150–400)
RBC: 2.97 MIL/uL — ABNORMAL LOW (ref 4.22–5.81)
RDW: 17.4 % — AB (ref 11.5–15.5)
WBC: 7.6 10*3/uL (ref 4.0–10.5)

## 2014-03-13 LAB — BASIC METABOLIC PANEL
BUN: 10 mg/dL (ref 6–23)
CO2: 25 mEq/L (ref 19–32)
Calcium: 8.6 mg/dL (ref 8.4–10.5)
Chloride: 108 mEq/L (ref 96–112)
Creat: 0.85 mg/dL (ref 0.50–1.35)
Glucose, Bld: 98 mg/dL (ref 70–99)
POTASSIUM: 4.4 meq/L (ref 3.5–5.3)
SODIUM: 138 meq/L (ref 135–145)

## 2014-03-13 NOTE — Progress Notes (Addendum)
[address] [place], [place]  [address] Phone: [phone] Fax:  [phone]  Date:  03/13/2014   Patient ID:  [name], Dwayne 02-09-1932, MRN [id number]   PCP:  Sanda Linger, MD  Cardiologist:  Jens Som Electrophysiologist:  Ladona Ridgel   History of Present Illness: Dwayne [name] is a 80 y.o. male with history of CAD s/p CABG, atrial fibrillation, EF 45-50% by echo 08/2013, HTN,diverticulosis who presents today to clinic in followup. Per notes, he had CABG 1993 with LIMA- LAD, SVG to OM, SVG to left circumflex, SVG to PD/PLSA. He had PCI of the saphenous vein graft to his obtuse marginal in 2003. Abdominal CT in June 2011 showed no abdominal aortic aneurysm. Last echo in 08/2013 showed mild LVH, EF 45-50%, abnormal septal motion apical hypokinesis, mild MR, mildly dilated LA. He was recently hospitalized at the end of February for GIB with Hgb down to 7.6 (was 11-12 in December). He was treated with Vit K and PRBCs. Coumadin was held. Aspirin was resumed. He followed up with GI who notes no recurrence. Per their note, they wanted the patient to follow up with [place] today to consider restarting Coumadin, citing that "if they [cardiology] think that the risk of being off Coumadin is too high, then I do not think we have any issues with him restarting it with close observation." Most recent Hgb stable at 9.1 in 02/21/2014.  He comes back in today doing well. No further bleeding - no bloody stools, BRBPR, or melena. Continues on baby aspirin. His wife of 30 years does say he seems to be more short of breath with exertion over the last few weeks. No chest pain. He says "After all, I am almost 82." No orthopnea, LEE. He is a retired Acupuncturist and seems to grasp the dilemma of stroke risk vs bleeding risk well. He says he would be willing to go back on Coumadin if this was warranted. No prior bleeding issue besides this most recent episode.  Recent Labs: 06/20/2013: HDL Cholesterol 48.50;  LDL (calc) 68  02/16/2014: ALT 9  02/21/2014: Creatinine 0.9; Hemoglobin 9.1*; Potassium 3.8   Wt Readings from Last 3 Encounters:  03/13/14 172 lb 4.8 oz (78.155 kg)  02/27/14 172 lb 4.8 oz (78.155 kg)  02/21/14 162 lb (73.483 kg)     Past Medical History  Diagnosis Date  . Atrial fibrillation     a. Was on Coumadin until GIB 01/2014.  [name] Kitchen Hyperlipidemia   . Bronchiectasis without acute exacerbation   . Disorders of diaphragm   . Plantar fascial fibromatosis   . Epistaxis   . Unspecified sinusitis (chronic)   . Allergic rhinitis, cause unspecified   . Spinal stenosis, unspecified region other than cervical   . Hypertension   . Coronary artery disease     a. s/p CABG 1993. b. s/p PCI of SVG to OM '03.  . Tachy-brady syndrome     s/p Medtronic PPM '07  . Myocardial infarction   . GERD (gastroesophageal reflux disease)   . Arthritis   . H/O: GI bleed     a. 01/2014 - presumed diverticular. Received PRBC, Vit K. Coumadin put on hold.  . Diverticulosis   . LV dysfunction     a. Echo 08/2013: EF 45-50%.    Current Outpatient Prescriptions  Medication Sig Dispense Refill  . aspirin EC 81 MG tablet Take 81 mg by mouth daily.      . Calcium-Magnesium-Vitamin D (CITRACAL CALCIUM+D)  600-40-500 MG-MG-UNIT TB24 Take 1 tablet by mouth 2 (two) times daily.       . CRESTOR 10 MG tablet Take 1 tablet by mouth  daily  90 tablet  3  . ferrous sulfate 325 (65 FE) MG tablet Take 1 tablet (325 mg total) by mouth 2 (two) times daily with a meal.  180 tablet  1  . fexofenadine (ALLEGRA) 180 MG tablet Take 180 mg by mouth at bedtime as needed (for allergies).       . finasteride (PROSCAR) 5 MG tablet Take 1 tablet (5 mg total) by mouth at bedtime.  90 tablet  3  . metoprolol tartrate (LOPRESSOR) 25 MG tablet Take 1 tablet (25 mg total) by mouth 2 (two) times daily.  180 tablet  6  . Multiple Vitamins-Minerals (OCUVITE-LUTEIN PO) Take 1 tablet by mouth daily.       . nitroGLYCERIN (NITROSTAT) 0.4 MG  SL tablet Place 0.4 mg under the tongue every 5 (five) minutes x 3 doses as needed for chest pain.      . Nutritional Supplements (RA MELATONIN/B-6 PO) Take 6 mg by mouth at bedtime.      [name] Kitchen. omeprazole (PRILOSEC) 40 MG capsule Take 40 mg by mouth daily.        . polyethylene glycol (MIRALAX / GLYCOLAX) packet Take 17 g by mouth daily.      [name] Kitchen. PROLIA 60 MG/ML SOLN injection Inject 60 mg into the skin every 6 (six) months.       . terazosin (HYTRIN) 5 MG capsule Take 1 capsule (5 mg total) by mouth daily.  90 capsule  3  . traZODone (DESYREL) 100 MG tablet Take 1 tablet (100 mg total) by mouth at bedtime.  90 tablet  3   No current facility-administered medications for this visit.    Allergies:   Ambien; Asparaginase derivatives; Caffeine; Clindamycin; Doxycycline hyclate; Erythromycin; Lactose intolerance (gi); Lyrica; Metronidazole; Nitrostat; Penicillins; Sulfonamide derivatives; and Tetracycline   Social History:  The patient  reports that he has never smoked. He has never used smokeless tobacco. He reports that he does not drink alcohol or use illicit drugs.   Family History:  The patient's family history includes Stroke in his father. There is no history of Cancer or Heart disease.   ROS:  Please see the history of present illness.    All other systems reviewed and negative.   PHYSICAL EXAM: VS:  BP 120/60  Pulse 79  Ht 5\' 10"  (1.778 m)  Wt 172 lb 4.8 oz (78.155 kg)  BMI 24.72 kg/m2 Well nourished, well developed, in no acute distress, thin with kyphotic posture HEENT: normal Neck: no JVD Cardiac:  normal S1, S2; RRR; 2/6 SEM LSB Lungs:  clear to auscultation bilaterally, no wheezing, rhonchi or rales Abd: soft, nontender, no hepatomegaly Ext: no edema Skin: warm and dry Neuro:  moves all extremities spontaneously, no focal abnormalities noted  EKG:  V paced 79bpm p waves not well defined, suspect remains in atrial fib  ASSESSMENT AND PLAN:  1. Atrial fibrillation with  CHADSVASC = 4 (HTN, 2 pts for age, vascular disease, possibly 5 if you count his prior mild LV dysfunction) - rate controlled. With elevated CHADSVASC score would feel that anticoagulation is warranted. Per GI note they are OK with restarting with close observation but are deferring to cardiology. I will forward to Dr. Jens Somrenshaw to review to weigh in since he is the patient's primary cardiologist. Would consider discontinuing aspirin if we resumed Coumadin  or aiming for lower INR range. 2. Lower GIB - no further bleeding. See above. CBC today. 3. Shortness of breath - he is describing some dyspnea with exertion. Weight has gradually increased but is stable compared to 3/11 and does not appear significantly volume overloaded on exam. I also appreciate a systolic murmur that was not previously documented. Update 2D echo, check CBC to make sure it's stable, check BNP and BMET. He has not had any chest pain. 4. CAD s/p remote CABG, PCI - remains on aspirin, BB, statin. 5. LV dysfunction EF 45-50% by echo 2014 - see above. 6. Tachy-brady syndrome s/p Medtronic PPM - need to make sure he has followup with Dr. Ladona Ridgel. Nursing will contact EP clinic to make sure they can get him back on schedule with pacer checks. 7. HTN - controlled.  Dispo: Follow up [facility] 1 month. Will wait to see what Dr. Jens Som says re: Coumadin. F/u EP.  SignedRonie Spies, PA-C  03/13/2014 10:59 AM   ADDENDUM 03/21/2014 2:10 PM Discussed recent GI followup with Dr. Jens Som. In light of waivering Hgb recently and ongoing plans for GI followup (possible colonoscopy), will hold off on resuming Coumadin for now. He will followup with Dr. Jens Som as scheduled to further discuss. (If he does eventually restart Coumadin, we will likely discontinue aspirin.)  Dayna Dunn PA-C

## 2014-03-13 NOTE — Patient Instructions (Signed)
Your physician has requested that you have an echocardiogram. Echocardiography is a painless test that uses sound waves to create images of your heart. It provides your doctor with information about the size and shape of your heart and how well your heart's chambers and valves are working. This procedure takes approximately one hour. There are no restrictions for this procedure.   Your physician recommends that you return for lab work.  Your physician recommends that you schedule a follow-up appointment in: 1 month with Dr. Jens Somrenshaw.

## 2014-03-14 ENCOUNTER — Other Ambulatory Visit: Payer: Self-pay

## 2014-03-14 LAB — BRAIN NATRIURETIC PEPTIDE: Brain Natriuretic Peptide: 501.4 pg/mL — ABNORMAL HIGH (ref 0.0–100.0)

## 2014-03-14 NOTE — Telephone Encounter (Signed)
Patient wife called requesting a new script for trazodone. She states that pt takes 1 1/2 tabs instead of 1tab. LMOVM advising MD out of the office until monday 03/18/14.

## 2014-03-15 DIAGNOSIS — H353190 Nonexudative age-related macular degeneration, unspecified eye, stage unspecified: Secondary | ICD-10-CM | POA: Diagnosis not present

## 2014-03-18 ENCOUNTER — Telehealth: Payer: Self-pay | Admitting: *Deleted

## 2014-03-18 MED ORDER — TRAZODONE HCL 100 MG PO TABS: 200.0000 mg | ORAL_TABLET | Freq: Every day | ORAL | Status: DC

## 2014-03-18 NOTE — Telephone Encounter (Signed)
done

## 2014-03-18 NOTE — Telephone Encounter (Signed)
pts wife called requesting a refill on Trazadone.  States pt takes 1.5 tablets at bedtime.  Please advise

## 2014-03-18 NOTE — Telephone Encounter (Signed)
Spoke with pts wife advised Rx sent.

## 2014-03-19 ENCOUNTER — Ambulatory Visit (INDEPENDENT_AMBULATORY_CARE_PROVIDER_SITE_OTHER): Payer: Medicare Other | Admitting: Internal Medicine

## 2014-03-19 ENCOUNTER — Encounter: Payer: Self-pay | Admitting: Internal Medicine

## 2014-03-19 ENCOUNTER — Telehealth: Payer: Self-pay | Admitting: *Deleted

## 2014-03-19 ENCOUNTER — Other Ambulatory Visit: Payer: Self-pay

## 2014-03-19 ENCOUNTER — Other Ambulatory Visit (INDEPENDENT_AMBULATORY_CARE_PROVIDER_SITE_OTHER): Payer: Medicare Other

## 2014-03-19 VITALS — BP 110/62 | HR 80 | Ht 69.0 in | Wt 171.0 lb

## 2014-03-19 DIAGNOSIS — D509 Iron deficiency anemia, unspecified: Secondary | ICD-10-CM

## 2014-03-19 DIAGNOSIS — I25810 Atherosclerosis of coronary artery bypass graft(s) without angina pectoris: Secondary | ICD-10-CM | POA: Diagnosis not present

## 2014-03-19 DIAGNOSIS — D62 Acute posthemorrhagic anemia: Secondary | ICD-10-CM

## 2014-03-19 DIAGNOSIS — K922 Gastrointestinal hemorrhage, unspecified: Secondary | ICD-10-CM

## 2014-03-19 LAB — CBC WITH DIFFERENTIAL/PLATELET
Basophils Absolute: 0.1 10*3/uL (ref 0.0–0.1)
Basophils Relative: 1 % (ref 0.0–3.0)
Eosinophils Absolute: 0.4 10*3/uL (ref 0.0–0.7)
Eosinophils Relative: 5.4 % — ABNORMAL HIGH (ref 0.0–5.0)
HEMATOCRIT: 27 % — AB (ref 39.0–52.0)
LYMPHS ABS: 1.6 10*3/uL (ref 0.7–4.0)
Lymphocytes Relative: 20.2 % (ref 12.0–46.0)
MCHC: 32 g/dL (ref 30.0–36.0)
MCV: 86.8 fl (ref 78.0–100.0)
MONO ABS: 0.8 10*3/uL (ref 0.1–1.0)
Monocytes Relative: 10.1 % (ref 3.0–12.0)
NEUTROS ABS: 5 10*3/uL (ref 1.4–7.7)
Neutrophils Relative %: 63.3 % (ref 43.0–77.0)
Platelets: 271 10*3/uL (ref 150.0–400.0)
RBC: 3.11 Mil/uL — ABNORMAL LOW (ref 4.22–5.81)
RDW: 19.8 % — ABNORMAL HIGH (ref 11.5–14.6)
WBC: 7.8 10*3/uL (ref 4.5–10.5)

## 2014-03-19 NOTE — Assessment & Plan Note (Addendum)
Heme negative stool Suspect Hgb decline is inconsequential Will recheck today He is still off A/C Tx at this time  Lab Results  Component Value Date   WBC 7.8 03/19/2014   HGB 8.6 Repeated and verified X2.* 03/19/2014   HCT 27.0* 03/19/2014   MCV 86.8 03/19/2014   PLT 271.0 03/19/2014   Hemoglobin in between March 5 and March 25 number. Given all of this I don't think he is continuing to bleed significantly at this point. Is up into the visit I received colonoscopy report and it was not in 2010 but was in 2005 and it showed sigmoid diverticulosis but was otherwise normal with a good prep. Given this and his anemia problems including a low ferritin with iron deficiency anemia it does seem reasonable to repeat colonoscopy. He is somewhat frail but I don't think he so ill that he could not tolerate a colonoscopy. He is Dr. Lamar SprinklesPerry's patient and I will forward the records to him for review and final decision and to schedule follow-up.

## 2014-03-19 NOTE — Assessment & Plan Note (Signed)
Waiting on review of last colonoscopy report to decide what is next in him

## 2014-03-19 NOTE — Assessment & Plan Note (Signed)
Hgb declined 1 g - recheck today Stool heme neg

## 2014-03-19 NOTE — Patient Instructions (Signed)
Your physician has requested that you go to the basement for the following lab work before leaving today: CBC/diff  Take your iron twice a day per Dr. Leone PayorGessner.  We will contact you with plans once your colonoscopy report is reviewed from Dr.[facility]'s office.   I appreciate the opportunity to care for you.

## 2014-03-19 NOTE — Telephone Encounter (Signed)
s/w pt's wife today about pt's lab results and the need to see GI ASAP due to Hgb dropping. I advised pt's wife that I s/w GI @ Dr. Uvaldo BristleGenser today and that pt has an appt today at 2 pm. Wife said ok and thank you.

## 2014-03-19 NOTE — Progress Notes (Signed)
Subjective:    Patient ID: Dwayne [name], male    DOB: Apr 15, 1932, 80 y.o.   MRN: [id number]  HPI Patient is here for followup at the request of Dr. Jens Som, of cardiology. Recent history notable for a lower GI bleed, while on warfarin, which he takes for atrial fibrillation. Warfarin was held and the bleeding stopped. Warfarin is still on hold. He had followup of cardiology and his hemoglobin had been 9.1 earlier in the month and is now is 8.2 on March 25. He was sent here because of the decline in hemoglobin. He is reporting no hematochezia or melena. He has been taking one iron supplement daily. He is somewhat weak, but is in no acute distress. No abdominal pain.  He had been seen by a physician assistant just the other week and we were attempting to get records of the colonoscopy thought he had in 2010, performed by Dr. Kinnie Scales. The patient's wife reports that she had on to Dr. Kinnie Scales office and signed a release of information for those reports. At the time of visit today we do not have access to that report. Please see below.  Allergies  Allergen Reactions  . Ambien [Zolpidem Tartrate] Other (See Comments)    unknown  . Asparaginase Derivatives Other (See Comments)    Makes jittery & causes vision problems, headaches  . Caffeine Other (See Comments)    Interrupts sleep  . Clindamycin     REACTION: upset stomach  . Doxycycline Hyclate     REACTION: n/v/d  . Erythromycin     REACTION: n/v/d  . Lactose Intolerance (Gi) Other (See Comments)    unknown  . Lyrica [Pregabalin] Other (See Comments)    unknown  . Metronidazole     REACTION: rash, itching  . Nitrostat [Nitroglycerin] Diarrhea  . Penicillins     REACTION: hives  . Sulfonamide Derivatives     REACTION: hives  . Tetracycline     REACTION: n/v/d   Outpatient Prescriptions Prior to Visit  Medication Sig Dispense Refill  . aspirin EC 81 MG tablet Take 81 mg by mouth daily.      . Calcium-Magnesium-Vitamin D  (CITRACAL CALCIUM+D) 600-40-500 MG-MG-UNIT TB24 Take 1 tablet by mouth 2 (two) times daily.       . CRESTOR 10 MG tablet Take 1 tablet by mouth  daily  90 tablet  3  . ferrous sulfate 325 (65 FE) MG tablet Take 1 tablet (325 mg total) by mouth 2 (two) times daily with a meal.  180 tablet  1  . fexofenadine (ALLEGRA) 180 MG tablet Take 180 mg by mouth at bedtime as needed (for allergies).       . finasteride (PROSCAR) 5 MG tablet Take 1 tablet (5 mg total) by mouth at bedtime.  90 tablet  3  . metoprolol tartrate (LOPRESSOR) 25 MG tablet Take 1 tablet (25 mg total) by mouth 2 (two) times daily.  180 tablet  6  . Multiple Vitamins-Minerals (OCUVITE-LUTEIN PO) Take 1 tablet by mouth daily.       . nitroGLYCERIN (NITROSTAT) 0.4 MG SL tablet Place 0.4 mg under the tongue every 5 (five) minutes x 3 doses as needed for chest pain.      . Nutritional Supplements (RA MELATONIN/B-6 PO) Take 10 mg by mouth at bedtime.       [name] Kitchen omeprazole (PRILOSEC) 40 MG capsule Take 40 mg by mouth daily.        [name] Kitchen  polyethylene glycol (MIRALAX / GLYCOLAX) packet Take 17 g by mouth daily.      [name] Kitchen. PROLIA 60 MG/ML SOLN injection Inject 60 mg into the skin every 6 (six) months.       . terazosin (HYTRIN) 5 MG capsule Take 1 capsule (5 mg total) by mouth daily.  90 capsule  3  . traZODone (DESYREL) 100 MG tablet Take 2 tablets (200 mg total) by mouth at bedtime.  180 tablet  3   No facility-administered medications prior to visit.   Past Medical History  Diagnosis Date  . Atrial fibrillation     a. Was on Coumadin until GIB 01/2014.  [name] Kitchen. Hyperlipidemia   . Bronchiectasis without acute exacerbation   . Disorders of diaphragm   . Plantar fascial fibromatosis   . Epistaxis   . Unspecified sinusitis (chronic)   . Allergic rhinitis, cause unspecified   . Spinal stenosis, unspecified region other than cervical   . Hypertension   . Coronary artery disease     a. s/p CABG 1993. b. s/p PCI of SVG to OM '03.  . Tachy-brady syndrome      s/p Medtronic PPM '07  . Myocardial infarction   . GERD (gastroesophageal reflux disease)   . Arthritis   . H/O: GI bleed     a. 01/2014 - presumed diverticular. Received PRBC, Vit K. Coumadin put on hold.  . Diverticulosis   . LV dysfunction     a. Echo 08/2013: EF 45-50%.   Past Surgical History  Procedure Laterality Date  . Coronary artery bypass graft      LIMA to LAD, SVG to OM, SVG to left circumflex, SVG to PD/PLSA  . Pacemaker insertion      2007, Medtronic dual-chamber  . Kidney cyst removal    . Doppler echocardiography  2011  . Cholecystectomy    . Vertebroplasty    . Cataract extraction w/ intraocular lens  implant, bilateral    . Hip arthroplasty Right 12/10/2013    Procedure: Right Hip Cemented Hemiarthroplasty;  Surgeon: Eldred MangesMark C Yates, MD;  [facility];  Service: Orthopedics;  Laterality: Right;  Right Hip Cemented Hemiarthroplasty  . Colonoscopy      Review of Systems Ambulates using a walker    Objective:   Physical Exam Elderly, NAD Somewhat frail Uses a walker to ambulate  Rectal: brown and heme negative stool    Assessment & Plan:  Lower GI bleed Heme negative stool Suspect Hgb decline is inconsequential Will recheck today He is still off A/C Tx at this time  Lab Results  Component Value Date   WBC 7.8 03/19/2014   HGB 8.6 Repeated and verified X2.* 03/19/2014   HCT 27.0* 03/19/2014   MCV 86.8 03/19/2014   PLT 271.0 03/19/2014   Hemoglobin in between March 5 and March 25 number. Given all of this I don't think he is continuing to bleed significantly at this point. Is up into the visit I received colonoscopy report and it was not in 2010 but was in 2005 and it showed sigmoid diverticulosis but was otherwise normal with a good prep. Given this and his anemia problems including a low ferritin with iron deficiency anemia it does seem reasonable to repeat colonoscopy. He is somewhat frail but I don't think he so ill that he could not tolerate a  colonoscopy. He is Dr. Lamar SprinklesPerry's patient and I will forward the records to him for review and final decision and to schedule follow-up.   Anemia, iron  deficiency Waiting on review of last colonoscopy report to decide what is next in him  Acute blood loss anemia Hgb declined 1 g - recheck today Stool heme neg   ON:GEXBM Jens Som, MD

## 2014-03-21 ENCOUNTER — Telehealth: Payer: Self-pay | Admitting: Physician Assistant

## 2014-03-21 ENCOUNTER — Ambulatory Visit (HOSPITAL_COMMUNITY)
Admission: RE | Admit: 2014-03-21 | Discharge: 2014-03-21 | Disposition: A | Discharge status: Home / Self Care | Payer: Medicare Other | Source: Ambulatory Visit | Attending: Cardiovascular Disease | Admitting: Cardiovascular Disease

## 2014-03-21 ENCOUNTER — Encounter: Payer: Self-pay | Admitting: Physician Assistant

## 2014-03-21 ENCOUNTER — Telehealth: Payer: Self-pay | Admitting: Cardiology

## 2014-03-21 DIAGNOSIS — R0602 Shortness of breath: Secondary | ICD-10-CM | POA: Diagnosis not present

## 2014-03-21 DIAGNOSIS — I059 Rheumatic mitral valve disease, unspecified: Secondary | ICD-10-CM | POA: Diagnosis not present

## 2014-03-21 NOTE — Telephone Encounter (Signed)
New problem   Need to know status of home health orders that was faxed for pt. Please call tiffany

## 2014-03-21 NOTE — Telephone Encounter (Signed)
Discussed recent GI followup with Dr. Jens Somrenshaw. In light of waivering Hgb recently and ongoing plans for GI followup (possible colonoscopy), will hold off on resuming Coumadin for now. He will followup with Dr. Jens Somrenshaw as scheduled to further discuss. (If he does eventually restart Coumadin, we will likely discontinue aspirin.) Please call patient to let him know Dr. Ludwig Clarksrenshaw's thoughts.  Dayna Dunn PA-C

## 2014-03-21 NOTE — Progress Notes (Signed)
  Echocardiogram 2D Echocardiogram has been performed.  Jorje GuildCHUNG, Crosby Bevan 03/21/2014, 2:31 PM

## 2014-03-21 NOTE — Progress Notes (Signed)
Quick Note:  Let him know Hgb 8.6 - overall stable Dr. Marina GoodellPerry will review this and my note and contact about next steps ______

## 2014-03-21 NOTE — Telephone Encounter (Signed)
03/13/14 labs: there has been a disruption in automatic transfer to inbasket. Reviewed through Epic chart. Please let patient know that BNP was mildly elevated but suspect some of this SOB is due to his anemia. BMET was OK. Will see what echo shows. Dayna Dunn PA-C

## 2014-03-21 NOTE — Progress Notes (Signed)
Error

## 2014-03-21 NOTE — Telephone Encounter (Signed)
Left message for tiffany, orders are here for sig. Dr Jens Somcrenshaw is out this and next week. Will fax as soon as signed.

## 2014-03-22 ENCOUNTER — Telehealth: Payer: Self-pay | Admitting: *Deleted

## 2014-03-22 NOTE — Telephone Encounter (Signed)
s/w pt's wife ;per Ronie Spiesayna Dunn, PA and Dr. Jens Somrenshaw stay off coumadin but stay on ASA until he sees Dr. Jens Somrenshaw in 5/7, will d/w Dr. Jens Somrenshaw at that time about what to do for coumadin and ASA. Wife veralized understanding to Plan of Care.

## 2014-03-22 NOTE — Telephone Encounter (Signed)
lmptcb for recommendations from Dayna Dunn and Dr. Crenshaw about coumadin 

## 2014-03-22 NOTE — Telephone Encounter (Signed)
lmptcb [facility] and Dr. Jens Somrenshaw about coumadin

## 2014-03-22 NOTE — Telephone Encounter (Signed)
s/w pt's wife ;per Dayna Dunn, PA and Dr. Crenshaw stay off coumadin but stay on ASA until he sees Dr. Crenshaw in 5/7, will d/w Dr. Crenshaw at that time about what to do for coumadin and ASA. Wife veralized understanding to Plan of Care. 

## 2014-03-26 ENCOUNTER — Telehealth: Payer: Self-pay | Admitting: Internal Medicine

## 2014-03-26 NOTE — Telephone Encounter (Signed)
Dr. Sandrea Hammondhou's dental office is calling to see what they can use to pre-med him that he is not allergic to. He is allergic to a lot of different medications. His ortho surgeon that performed his hip surgery does not pre-med for dental procedures but they do not feel comfortable performing his dental procedure without doing so.  Please call back to advise Ph#: [phone]

## 2014-03-27 NOTE — Telephone Encounter (Signed)
Please advise 

## 2014-03-27 NOTE — Telephone Encounter (Signed)
Called Dr. Sandrea Hammondhou's with info.

## 2014-03-27 NOTE — Telephone Encounter (Signed)
I guess they are asking about antibiotic prophylaxis???? I would use a cephalosporin like ceftin since he has a PCN allergy and there is a very small chance that he would "cross react"

## 2014-03-29 DIAGNOSIS — R269 Unspecified abnormalities of gait and mobility: Secondary | ICD-10-CM | POA: Diagnosis not present

## 2014-03-29 DIAGNOSIS — M6281 Muscle weakness (generalized): Secondary | ICD-10-CM | POA: Diagnosis not present

## 2014-03-29 DIAGNOSIS — Z96649 Presence of unspecified artificial hip joint: Secondary | ICD-10-CM | POA: Diagnosis not present

## 2014-04-02 DIAGNOSIS — R269 Unspecified abnormalities of gait and mobility: Secondary | ICD-10-CM | POA: Diagnosis not present

## 2014-04-02 DIAGNOSIS — M6281 Muscle weakness (generalized): Secondary | ICD-10-CM | POA: Diagnosis not present

## 2014-04-03 ENCOUNTER — Telehealth: Payer: Self-pay

## 2014-04-03 MED ORDER — CLINDAMYCIN HCL 300 MG PO CAPS: 600.0000 mg | ORAL_CAPSULE | Freq: Once | ORAL | Status: DC

## 2014-04-03 MED ORDER — CEPHALEXIN 500 MG PO CAPS: 500.0000 mg | ORAL_CAPSULE | Freq: Once | ORAL | Status: DC

## 2014-04-03 NOTE — Telephone Encounter (Signed)
Pt allergic to clindamycin per allergy list along with a few others penicillin, tetracycline etc... Thanks

## 2014-04-03 NOTE — Telephone Encounter (Signed)
changed

## 2014-04-03 NOTE — Telephone Encounter (Signed)
An RX has been sent to his pharmacy

## 2014-04-03 NOTE — Telephone Encounter (Signed)
Marylu LundJanet called back from dental office requesting that an antibiotic is called in this am (see previous phone note). Please send to pharmacy due to dentist does not prescribe.  April M Miller, CMA at 03/27/2014  2:12 PM      Status: Signed            Called Dr. Sandrea Hammondhou's with info.        Etta Grandchildhomas L Jones, MD at 03/27/2014  9:08 AM      Status: Signed            I guess they are asking about antibiotic prophylaxis???? I would use a cephalosporin like ceftin since he has a PCN allergy and there is a very small chance that he would "cross react"

## 2014-04-03 NOTE — Telephone Encounter (Signed)
[facility] that [facility] has a question about two antibiotic that we sent in. Please call sara [phone].

## 2014-04-03 NOTE — Telephone Encounter (Signed)
Returned call to Huntley DecSara [facility]), told to disregard original rx for clindamycin and to fill the rx for the cephalexin. Called Marylu LundJanet [employer](Dentist Office) and notified the rx was called in.

## 2014-04-04 DIAGNOSIS — Z96649 Presence of unspecified artificial hip joint: Secondary | ICD-10-CM | POA: Diagnosis not present

## 2014-04-04 DIAGNOSIS — R269 Unspecified abnormalities of gait and mobility: Secondary | ICD-10-CM | POA: Diagnosis not present

## 2014-04-04 DIAGNOSIS — M6281 Muscle weakness (generalized): Secondary | ICD-10-CM | POA: Diagnosis not present

## 2014-04-09 ENCOUNTER — Ambulatory Visit: Payer: Medicare Other | Admitting: Cardiology

## 2014-04-09 DIAGNOSIS — M6281 Muscle weakness (generalized): Secondary | ICD-10-CM | POA: Diagnosis not present

## 2014-04-09 DIAGNOSIS — Z96649 Presence of unspecified artificial hip joint: Secondary | ICD-10-CM | POA: Diagnosis not present

## 2014-04-09 DIAGNOSIS — R269 Unspecified abnormalities of gait and mobility: Secondary | ICD-10-CM | POA: Diagnosis not present

## 2014-04-11 DIAGNOSIS — M6281 Muscle weakness (generalized): Secondary | ICD-10-CM | POA: Diagnosis not present

## 2014-04-11 DIAGNOSIS — Z96649 Presence of unspecified artificial hip joint: Secondary | ICD-10-CM | POA: Diagnosis not present

## 2014-04-11 DIAGNOSIS — R269 Unspecified abnormalities of gait and mobility: Secondary | ICD-10-CM | POA: Diagnosis not present

## 2014-04-16 DIAGNOSIS — R269 Unspecified abnormalities of gait and mobility: Secondary | ICD-10-CM | POA: Diagnosis not present

## 2014-04-16 DIAGNOSIS — Z96649 Presence of unspecified artificial hip joint: Secondary | ICD-10-CM | POA: Diagnosis not present

## 2014-04-16 DIAGNOSIS — M6281 Muscle weakness (generalized): Secondary | ICD-10-CM | POA: Diagnosis not present

## 2014-04-18 DIAGNOSIS — M6281 Muscle weakness (generalized): Secondary | ICD-10-CM | POA: Diagnosis not present

## 2014-04-18 DIAGNOSIS — Z96649 Presence of unspecified artificial hip joint: Secondary | ICD-10-CM | POA: Diagnosis not present

## 2014-04-18 DIAGNOSIS — R269 Unspecified abnormalities of gait and mobility: Secondary | ICD-10-CM | POA: Diagnosis not present

## 2014-04-23 DIAGNOSIS — R269 Unspecified abnormalities of gait and mobility: Secondary | ICD-10-CM | POA: Diagnosis not present

## 2014-04-23 DIAGNOSIS — Z96659 Presence of unspecified artificial knee joint: Secondary | ICD-10-CM | POA: Diagnosis not present

## 2014-04-23 DIAGNOSIS — M6281 Muscle weakness (generalized): Secondary | ICD-10-CM | POA: Diagnosis not present

## 2014-04-25 ENCOUNTER — Encounter: Payer: Self-pay | Admitting: Cardiology

## 2014-04-25 ENCOUNTER — Ambulatory Visit (INDEPENDENT_AMBULATORY_CARE_PROVIDER_SITE_OTHER): Payer: Medicare Other | Admitting: Cardiology

## 2014-04-25 VITALS — BP 116/62 | HR 64 | Ht 70.0 in | Wt 176.0 lb

## 2014-04-25 DIAGNOSIS — I4891 Unspecified atrial fibrillation: Secondary | ICD-10-CM | POA: Diagnosis not present

## 2014-04-25 DIAGNOSIS — R269 Unspecified abnormalities of gait and mobility: Secondary | ICD-10-CM | POA: Diagnosis not present

## 2014-04-25 DIAGNOSIS — Z950 Presence of cardiac pacemaker: Secondary | ICD-10-CM | POA: Diagnosis not present

## 2014-04-25 DIAGNOSIS — I25810 Atherosclerosis of coronary artery bypass graft(s) without angina pectoris: Secondary | ICD-10-CM

## 2014-04-25 DIAGNOSIS — I10 Essential (primary) hypertension: Secondary | ICD-10-CM

## 2014-04-25 DIAGNOSIS — Z96649 Presence of unspecified artificial hip joint: Secondary | ICD-10-CM | POA: Diagnosis not present

## 2014-04-25 DIAGNOSIS — I2510 Atherosclerotic heart disease of native coronary artery without angina pectoris: Secondary | ICD-10-CM | POA: Diagnosis not present

## 2014-04-25 DIAGNOSIS — M6281 Muscle weakness (generalized): Secondary | ICD-10-CM | POA: Diagnosis not present

## 2014-04-25 LAB — BASIC METABOLIC PANEL
BUN: 12 mg/dL (ref 6–23)
CO2: 26 meq/L (ref 19–32)
CREATININE: 0.9 mg/dL (ref 0.4–1.5)
Calcium: 9 mg/dL (ref 8.4–10.5)
Chloride: 108 mEq/L (ref 96–112)
GFR: 87.41 mL/min (ref >60.00)
GLUCOSE: 96 mg/dL (ref 70–99)
Potassium: 3.9 mEq/L (ref 3.5–5.1)
Sodium: 140 mEq/L (ref 135–145)

## 2014-04-25 LAB — CBC WITH DIFFERENTIAL/PLATELET
BASOS ABS: 0.1 10*3/uL (ref 0.0–0.1)
Basophils Relative: 1.4 % (ref 0.0–3.0)
Eosinophils Absolute: 0.4 10*3/uL (ref 0.0–0.7)
Eosinophils Relative: 5.5 % — ABNORMAL HIGH (ref 0.0–5.0)
HCT: 39.2 % (ref 39.0–52.0)
Hemoglobin: 12.7 g/dL — ABNORMAL LOW (ref 13.0–17.0)
LYMPHS PCT: 18.7 % (ref 12.0–46.0)
Lymphs Abs: 1.5 10*3/uL (ref 0.7–4.0)
MCHC: 32.3 g/dL (ref 30.0–36.0)
MCV: 89.3 fl (ref 78.0–100.0)
MONOS PCT: 8.6 % (ref 3.0–12.0)
Monocytes Absolute: 0.7 10*3/uL (ref 0.1–1.0)
Neutro Abs: 5.1 10*3/uL (ref 1.4–7.7)
Neutrophils Relative %: 65.8 % (ref 43.0–77.0)
PLATELETS: 220 10*3/uL (ref 150.0–400.0)
RBC: 4.39 Mil/uL (ref 4.22–5.81)
RDW: 19.9 % — ABNORMAL HIGH (ref 11.5–15.5)
WBC: 7.8 10*3/uL (ref 4.0–10.5)

## 2014-04-25 NOTE — Progress Notes (Signed)
HPI: FU CAD s/p CABG, atrial fibrillation, HTN. Per notes, he had CABG 1993 with LIMA- LAD, SVG to OM, SVG to left circumflex, SVG to PD/PLSA. He had PCI of the saphenous vein graft to his obtuse marginal in 2003. Abdominal CT in June 2011 showed no abdominal aortic aneurysm. He was hospitalized February 2015 for GIB with Hgb down to 7.6 (was 11-12 in December). He was treated with Vit K and PRBCs. Coumadin was held. Aspirin was resumed. Last echocardiogram April 2015 showed an ejection fraction of 50-55%, mild mitral regurgitation and trace aortic insufficiency. Since he was last seen, There has been no bleeding. He denies dyspnea, chest pain or syncope. He does not fall. No melena or hematochezia.   Current Outpatient Prescriptions  Medication Sig Dispense Refill  . aspirin EC 81 MG tablet Take 81 mg by mouth daily.      . Calcium-Magnesium-Vitamin D (CITRACAL CALCIUM+D) 600-40-500 MG-MG-UNIT TB24 Take 1 tablet by mouth 2 (two) times daily.       . cephALEXin (KEFLEX) 500 MG capsule Take 1 capsule (500 mg total) by mouth once. Take 2000 mg po one hour prior to dental proceduer  4 capsule  2  . CRESTOR 10 MG tablet Take 1 tablet by mouth  daily  90 tablet  3  . Cyanocobalamin (VITAMIN B-12 IJ) Inject as directed every 21 ( twenty-one) days.      . ferrous sulfate 325 (65 FE) MG tablet Take 1 tablet (325 mg total) by mouth 2 (two) times daily with a meal.  180 tablet  1  . fexofenadine (ALLEGRA) 180 MG tablet Take 180 mg by mouth at bedtime as needed (for allergies).       . finasteride (PROSCAR) 5 MG tablet Take 1 tablet (5 mg total) by mouth at bedtime.  90 tablet  3  . metoprolol tartrate (LOPRESSOR) 25 MG tablet Take 1 tablet (25 mg total) by mouth 2 (two) times daily.  180 tablet  6  . Multiple Vitamins-Minerals (OCUVITE-LUTEIN PO) Take 1 tablet by mouth daily.       . nitroGLYCERIN (NITROSTAT) 0.4 MG SL tablet Place 0.4 mg under the tongue every 5 (five) minutes x 3 doses as needed for  chest pain.      . Nutritional Supplements (RA MELATONIN/B-6 PO) Take 10 mg by mouth at bedtime.       [name] Kitchen. omeprazole (PRILOSEC) 40 MG capsule Take 40 mg by mouth daily.        . polyethylene glycol (MIRALAX / GLYCOLAX) packet Take 17 g by mouth daily.      [name] Kitchen. PROLIA 60 MG/ML SOLN injection Inject 60 mg into the skin every 6 (six) months.       . terazosin (HYTRIN) 5 MG capsule Take 1 capsule (5 mg total) by mouth daily.  90 capsule  3  . traZODone (DESYREL) 100 MG tablet Take 150 mg by mouth at bedtime.       No current facility-administered medications for this visit.     Past Medical History  Diagnosis Date  . Atrial fibrillation     a. Was on Coumadin until GIB 01/2014.  [name] Kitchen. Hyperlipidemia   . Bronchiectasis without acute exacerbation   . Disorders of diaphragm   . Plantar fascial fibromatosis   . Epistaxis   . Unspecified sinusitis (chronic)   . Allergic rhinitis, cause unspecified   . Spinal stenosis, unspecified region other than cervical   . Hypertension   . Coronary artery  disease     a. s/p CABG 1993. b. s/p PCI of SVG to OM '03.  . Tachy-brady syndrome     s/p Medtronic PPM '07  . Myocardial infarction   . GERD (gastroesophageal reflux disease)   . Arthritis   . H/O: GI bleed     a. 01/2014 - presumed diverticular. Received PRBC, Vit K. Coumadin put on hold.  . Diverticulosis   . LV dysfunction     a. Echo 08/2013: EF 45-50%.    Past Surgical History  Procedure Laterality Date  . Coronary artery bypass graft      LIMA to LAD, SVG to OM, SVG to left circumflex, SVG to PD/PLSA  . Pacemaker insertion      2007, Medtronic dual-chamber  . Kidney cyst removal    . Doppler echocardiography  2011  . Cholecystectomy    . Vertebroplasty    . Cataract extraction w/ intraocular lens  implant, bilateral    . Hip arthroplasty Right 12/10/2013    Procedure: Right Hip Cemented Hemiarthroplasty;  Surgeon: Eldred MangesMark C Yates, MD;  [facility];  Service: Orthopedics;  Laterality:  Right;  Right Hip Cemented Hemiarthroplasty  . Colonoscopy      History   Social History  . Marital Status: Married    Spouse Name: Ollen GrossMarjorie    Number of Children: 4  . Years of Education: N/A   Occupational History  .     Social History Main Topics  . Smoking status: Never Smoker   . Smokeless tobacco: Never Used  . Alcohol Use: No  . Drug Use: No  . Sexual Activity: No   Other Topics Concern  . Not on file   Social History Narrative   Married.  Ambulates with a walker.    ROS: no fevers or chills, productive cough, hemoptysis, dysphasia, odynophagia, melena, hematochezia, dysuria, hematuria, rash, seizure activity, orthopnea, PND, pedal edema, claudication. Remaining systems are negative.  Physical Exam: Well-developed well-nourished in no acute distress.  Skin is warm and dry.  HEENT is normal.  Neck is supple.  Chest is clear to auscultation with normal expansion.  Cardiovascular exam is regular rate and rhythm.  Abdominal exam nontender or distended. No masses palpated. Extremities show no edema. neuro grossly intact  ECG 03/13/2014-ventricular pacing with underlying atrial fibrillation.

## 2014-04-25 NOTE — Assessment & Plan Note (Signed)
Blood pressure controlled. Continue present medications. 

## 2014-04-25 NOTE — Assessment & Plan Note (Signed)
Follow-up electrophysiology. 

## 2014-04-25 NOTE — Assessment & Plan Note (Signed)
Continue statin. 

## 2014-04-25 NOTE — Patient Instructions (Signed)
Your physician recommends that you schedule a follow-up appointment in: 3 MONTHS WITH DR CRENSHAW  Your physician recommends that you HAVE LAB WORK TODAY  

## 2014-04-25 NOTE — Assessment & Plan Note (Signed)
Patient remains in atrial fibrillation. Continue metoprolol for rate control. Long discussion concerning anticoagulation. We have decided to retry anticoagulation. He has not had any bleeding and previous event was felt related to diverticular disease. Plan to check hemoglobin and renal function today. If hemoglobin stable I will discontinue aspirin and begin apixaban. We will repeat his hemoglobin 4 weeks later. I have instructed him to follow closely for any evidence of GI bleeding. If he has recurrent events we would need to discontinue anticoagulation and treat with aspirin realizing there is increased risk of CVA. He understands the above and is agreeable. I will see him back in 3 months.

## 2014-04-26 ENCOUNTER — Other Ambulatory Visit: Payer: Self-pay | Admitting: *Deleted

## 2014-04-26 DIAGNOSIS — Z79899 Other long term (current) drug therapy: Secondary | ICD-10-CM

## 2014-04-26 DIAGNOSIS — I4891 Unspecified atrial fibrillation: Secondary | ICD-10-CM

## 2014-04-26 MED ORDER — APIXABAN 5 MG PO TABS: 5.0000 mg | ORAL_TABLET | Freq: Two times a day (BID) | ORAL | Status: DC

## 2014-04-30 DIAGNOSIS — Z96649 Presence of unspecified artificial hip joint: Secondary | ICD-10-CM | POA: Diagnosis not present

## 2014-04-30 DIAGNOSIS — R269 Unspecified abnormalities of gait and mobility: Secondary | ICD-10-CM | POA: Diagnosis not present

## 2014-04-30 DIAGNOSIS — M6281 Muscle weakness (generalized): Secondary | ICD-10-CM | POA: Diagnosis not present

## 2014-05-01 ENCOUNTER — Encounter: Payer: Self-pay | Admitting: Internal Medicine

## 2014-05-01 ENCOUNTER — Ambulatory Visit (INDEPENDENT_AMBULATORY_CARE_PROVIDER_SITE_OTHER): Payer: Medicare Other | Admitting: Internal Medicine

## 2014-05-01 ENCOUNTER — Telehealth: Payer: Self-pay | Admitting: Cardiology

## 2014-05-01 VITALS — BP 126/70 | HR 70 | Ht 70.0 in | Wt 165.0 lb

## 2014-05-01 DIAGNOSIS — Z950 Presence of cardiac pacemaker: Secondary | ICD-10-CM

## 2014-05-01 DIAGNOSIS — I25810 Atherosclerosis of coronary artery bypass graft(s) without angina pectoris: Secondary | ICD-10-CM | POA: Diagnosis not present

## 2014-05-01 DIAGNOSIS — I2510 Atherosclerotic heart disease of native coronary artery without angina pectoris: Secondary | ICD-10-CM | POA: Diagnosis not present

## 2014-05-01 DIAGNOSIS — I495 Sick sinus syndrome: Secondary | ICD-10-CM | POA: Diagnosis not present

## 2014-05-01 DIAGNOSIS — I4891 Unspecified atrial fibrillation: Secondary | ICD-10-CM | POA: Diagnosis not present

## 2014-05-01 LAB — MDC_IDC_ENUM_SESS_TYPE_INCLINIC
Battery Remaining Longevity: 7 mo
Brady Statistic AP VP Percent: 24 %
Brady Statistic AS VS Percent: 46 %
Date Time Interrogation Session: 20150513142051
Lead Channel Impedance Value: 484 Ohm
Lead Channel Pacing Threshold Amplitude: 0.5 V
Lead Channel Pacing Threshold Pulse Width: 0.4 ms
Lead Channel Sensing Intrinsic Amplitude: 11.2 mV
Lead Channel Setting Pacing Amplitude: 2 V
Lead Channel Setting Pacing Amplitude: 2.5 V
Lead Channel Setting Sensing Sensitivity: 5.6 mV
MDC IDC MSMT BATTERY IMPEDANCE: 3952 Ohm
MDC IDC MSMT BATTERY VOLTAGE: 2.64 V
MDC IDC MSMT LEADCHNL RA IMPEDANCE VALUE: 576 Ohm
MDC IDC MSMT LEADCHNL RA SENSING INTR AMPL: 0.7 mV
MDC IDC SET LEADCHNL RV PACING PULSEWIDTH: 0.4 ms
MDC IDC STAT BRADY AP VS PERCENT: 0 %
MDC IDC STAT BRADY AS VP PERCENT: 30 %

## 2014-05-01 NOTE — Progress Notes (Signed)
HPI [name] returns today for followup. He is a very pleasant 80 year old man with a history of chronic atrial fibrillation and complete heart block status post permanent pacemaker insertion. In the interim, he has been stable. He broke his hip several months ago. He has undergone treatment and is improving. He is using a walker. He denies syncope, chest pain, shortness of breath, or peripheral edema. Allergies  Allergen Reactions  . Ambien [Zolpidem Tartrate] Other (See Comments)    unknown  . Asparaginase Derivatives Other (See Comments)    Makes jittery & causes vision problems, headaches  . Caffeine Other (See Comments)    Interrupts sleep  . Clindamycin     REACTION: upset stomach  . Doxycycline Hyclate     REACTION: n/v/d  . Erythromycin     REACTION: n/v/d  . Lactose Intolerance (Gi) Other (See Comments)    unknown  . Lyrica [Pregabalin] Other (See Comments)    unknown  . Metronidazole     REACTION: rash, itching  . Nitrostat [Nitroglycerin] Diarrhea  . Penicillins     REACTION: hives  . Sulfonamide Derivatives     REACTION: hives  . Tetracycline     REACTION: n/v/d     Current Outpatient Prescriptions  Medication Sig Dispense Refill  . apixaban (ELIQUIS) 5 MG TABS tablet Take 1 tablet (5 mg total) by mouth 2 (two) times daily.  60 tablet  6  . Calcium-Magnesium-Vitamin D (CITRACAL CALCIUM+D) 600-40-500 MG-MG-UNIT TB24 Take 1 tablet by mouth 2 (two) times daily.       . cephALEXin (KEFLEX) 500 MG capsule Take 1 capsule (500 mg total) by mouth once. Take 2000 mg po one hour prior to dental proceduer  4 capsule  2  . CRESTOR 10 MG tablet Take 1 tablet by mouth  daily  90 tablet  3  . Cyanocobalamin (VITAMIN B-12 IJ) Inject as directed every 21 ( twenty-one) days.      . ferrous sulfate 325 (65 FE) MG tablet Take 1 tablet (325 mg total) by mouth 2 (two) times daily with a meal.  180 tablet  1  . fexofenadine (ALLEGRA) 180 MG tablet Take 180 mg by mouth at bedtime as  needed (for allergies).       . finasteride (PROSCAR) 5 MG tablet Take 1 tablet (5 mg total) by mouth at bedtime.  90 tablet  3  . metoprolol tartrate (LOPRESSOR) 25 MG tablet Take 1 tablet (25 mg total) by mouth 2 (two) times daily.  180 tablet  6  . Multiple Vitamins-Minerals (OCUVITE-LUTEIN PO) Take 1 tablet by mouth daily.       . nitroGLYCERIN (NITROSTAT) 0.4 MG SL tablet Place 0.4 mg under the tongue every 5 (five) minutes x 3 doses as needed for chest pain.      . Nutritional Supplements (RA MELATONIN/B-6 PO) Take 10 mg by mouth at bedtime.       [name] Kitchen. omeprazole (PRILOSEC) 40 MG capsule Take 40 mg by mouth daily.        . polyethylene glycol (MIRALAX / GLYCOLAX) packet Take 17 g by mouth daily.      [name] Kitchen. PROLIA 60 MG/ML SOLN injection Inject 60 mg into the skin every 6 (six) months.       . terazosin (HYTRIN) 5 MG capsule Take 1 capsule (5 mg total) by mouth daily.  90 capsule  3  . traZODone (DESYREL) 100 MG tablet Take 150 mg by mouth at bedtime.       No  current facility-administered medications for this visit.     Past Medical History  Diagnosis Date  . Atrial fibrillation     a. Was on Coumadin until GIB 01/2014.  [name] Kitchen. Hyperlipidemia   . Bronchiectasis without acute exacerbation   . Disorders of diaphragm   . Plantar fascial fibromatosis   . Epistaxis   . Unspecified sinusitis (chronic)   . Allergic rhinitis, cause unspecified   . Spinal stenosis, unspecified region other than cervical   . Hypertension   . Coronary artery disease     a. s/p CABG 1993. b. s/p PCI of SVG to OM '03.  . Tachy-brady syndrome     s/p Medtronic PPM '07  . Myocardial infarction   . GERD (gastroesophageal reflux disease)   . Arthritis   . H/O: GI bleed     a. 01/2014 - presumed diverticular. Received PRBC, Vit K. Coumadin put on hold.  . Diverticulosis   . LV dysfunction     a. Echo 08/2013: EF 45-50%.    ROS:   All systems reviewed and negative except as noted in the HPI.   Past Surgical  History  Procedure Laterality Date  . Coronary artery bypass graft      LIMA to LAD, SVG to OM, SVG to left circumflex, SVG to PD/PLSA  . Pacemaker insertion      2007, Medtronic dual-chamber  . Kidney cyst removal    . Doppler echocardiography  2011  . Cholecystectomy    . Vertebroplasty    . Cataract extraction w/ intraocular lens  implant, bilateral    . Hip arthroplasty Right 12/10/2013    Procedure: Right Hip Cemented Hemiarthroplasty;  Surgeon: Dwayne MangesMark C Yates, MD;  [facility];  Service: Orthopedics;  Laterality: Right;  Right Hip Cemented Hemiarthroplasty  . Colonoscopy       Family History  Problem Relation Age of Onset  . Cancer Neg Hx   . Heart disease Neg Hx   . Stroke Father      History   Social History  . Marital Status: Married    Spouse Name: Dwayne [name]    Number of Children: 4  . Years of Education: N/A   Occupational History  .     Social History Main Topics  . Smoking status: Never Smoker   . Smokeless tobacco: Never Used  . Alcohol Use: No  . Drug Use: No  . Sexual Activity: No   Other Topics Concern  . Not on file   Social History Narrative   Married.  Ambulates with a walker.     BP 126/70  Pulse 70  Ht 5\' 10"  (1.778 m)  Wt 165 lb (74.844 kg)  BMI 23.68 kg/m2  Physical Exam:  frail appearing 80 year old man,NAD HEENT: Unremarkable Neck:  7 cm JVD, no thyromegally Back:  No CVA tenderness Lungs:  Clear with no wheezes, rales, or rhonchi. No increased work of breathing HEART:  Regular rate rhythm, no murmurs, no rubs, no clicks Abd:  soft, positive bowel sounds, no organomegally, no rebound, no guarding Ext:  2 plus pulses, no edema, no cyanosis, no clubbing Skin:  No rashes no nodules Neuro:  CN II through XII intact, motor grossly intact   DEVICE  Normal device function.  See PaceArt for details.   Assess/Plan:

## 2014-05-01 NOTE — Assessment & Plan Note (Signed)
He is increasingly out of rhythm but his ventricular rate is well controlled and he is having no palpitations.

## 2014-05-01 NOTE — Assessment & Plan Note (Signed)
He denies anginal symptoms. No change in medical therapy. 

## 2014-05-01 NOTE — Assessment & Plan Note (Signed)
His Medtronic DDD PM is working normally. Will recheck in several months. 

## 2014-05-01 NOTE — Telephone Encounter (Signed)
Spoke with pt, he reports he needs prior auth for his eliquis and the pharm has faxed something to our office. Will forward to kim adams, pt care advocate, to follow up with Berkley Harveyauth

## 2014-05-01 NOTE — Telephone Encounter (Signed)
New problem ° ° °Pt returning your call. °

## 2014-05-01 NOTE — Patient Instructions (Signed)
Your physician recommends that you schedule a follow-up appointment in: 3 months with device clinic and 12 months with Dr Taylor  

## 2014-05-02 DIAGNOSIS — M6281 Muscle weakness (generalized): Secondary | ICD-10-CM | POA: Diagnosis not present

## 2014-05-02 DIAGNOSIS — R269 Unspecified abnormalities of gait and mobility: Secondary | ICD-10-CM | POA: Diagnosis not present

## 2014-05-02 DIAGNOSIS — Z96649 Presence of unspecified artificial hip joint: Secondary | ICD-10-CM | POA: Diagnosis not present

## 2014-05-02 NOTE — Telephone Encounter (Signed)
PA to [facility] rx for eliquis 

## 2014-05-03 ENCOUNTER — Telehealth: Payer: Self-pay | Admitting: Internal Medicine

## 2014-05-03 ENCOUNTER — Ambulatory Visit (INDEPENDENT_AMBULATORY_CARE_PROVIDER_SITE_OTHER): Payer: Medicare Other | Admitting: *Deleted

## 2014-05-03 DIAGNOSIS — D518 Other vitamin B12 deficiency anemias: Secondary | ICD-10-CM

## 2014-05-03 MED ORDER — CYANOCOBALAMIN 1000 MCG/ML IJ SOLN
1000.0000 ug | Freq: Once | INTRAMUSCULAR | Status: AC
  Administered 2014-05-03: 1000 ug via INTRAMUSCULAR

## 2014-05-03 NOTE — Telephone Encounter (Signed)
Scheduled patient for 21 days for b12 per patient.  Just wanted to make sure that he was to come back in that length of time.

## 2014-05-06 ENCOUNTER — Telehealth: Payer: Self-pay | Admitting: *Deleted

## 2014-05-06 NOTE — Telephone Encounter (Signed)
[employer] approved eliquis through 05/02/2015, PA # [id number], [facility] notified

## 2014-05-07 ENCOUNTER — Encounter: Payer: Self-pay | Admitting: Internal Medicine

## 2014-05-07 ENCOUNTER — Ambulatory Visit (INDEPENDENT_AMBULATORY_CARE_PROVIDER_SITE_OTHER): Payer: Medicare Other | Admitting: Internal Medicine

## 2014-05-07 VITALS — BP 100/62 | HR 72 | Ht 69.0 in | Wt 165.0 lb

## 2014-05-07 DIAGNOSIS — M6281 Muscle weakness (generalized): Secondary | ICD-10-CM | POA: Diagnosis not present

## 2014-05-07 DIAGNOSIS — D62 Acute posthemorrhagic anemia: Secondary | ICD-10-CM

## 2014-05-07 DIAGNOSIS — K5731 Diverticulosis of large intestine without perforation or abscess with bleeding: Secondary | ICD-10-CM

## 2014-05-07 DIAGNOSIS — I25810 Atherosclerosis of coronary artery bypass graft(s) without angina pectoris: Secondary | ICD-10-CM

## 2014-05-07 DIAGNOSIS — Z96649 Presence of unspecified artificial hip joint: Secondary | ICD-10-CM | POA: Diagnosis not present

## 2014-05-07 DIAGNOSIS — R269 Unspecified abnormalities of gait and mobility: Secondary | ICD-10-CM | POA: Diagnosis not present

## 2014-05-07 DIAGNOSIS — D509 Iron deficiency anemia, unspecified: Secondary | ICD-10-CM | POA: Diagnosis not present

## 2014-05-07 NOTE — Progress Notes (Signed)
HISTORY OF PRESENT ILLNESS:  Dwayne [name] is a 80 y.o. male with multiple significant medical problems who was hospitalized in early March with acute lower GI bleeding while on Coumadin for atrial fibrillation. Clinically, felt to be diverticular bleed. Colonoscopy 10 years ago with diverticulosis only. Discharge hemoglobin in the 8-9 range. Seen in followup by my partner 03/19/2014 for persistent anemia. Hemoccult negative at that time. Other laboratory suggesting iron deficiency. Set up for this appointment to see how he is doing clinically as well as possibly consider colonoscopy. Patient is accompanied by his wife. He denies any abdominal complaints. Stools are normal looking are occasionally dark secondary to iron. No abdominal pain or weight loss. Prescribed Eliquis the other day by Dr. Jens Somrenshaw. Yet to start. Most recent hemoglobin significantly improved to 12.7 on 04/25/2014.  REVIEW OF SYSTEMS:  All non-GI ROS negative except for arthritis, hearing problems, fatigue,  Past Medical History  Diagnosis Date  . Atrial fibrillation     a. Was on Coumadin until GIB 01/2014.  [name] Kitchen. Hyperlipidemia   . Bronchiectasis without acute exacerbation   . Disorders of diaphragm   . Plantar fascial fibromatosis   . Epistaxis   . Unspecified sinusitis (chronic)   . Allergic rhinitis, cause unspecified   . Spinal stenosis, unspecified region other than cervical   . Hypertension   . Coronary artery disease     a. s/p CABG 1993. b. s/p PCI of SVG to OM '03.  . Tachy-brady syndrome     s/p Medtronic PPM '07  . Myocardial infarction   . GERD (gastroesophageal reflux disease)   . Arthritis   . H/O: GI bleed     a. 01/2014 - presumed diverticular. Received PRBC, Vit K. Coumadin put on hold.  . Diverticulosis   . LV dysfunction     a. Echo 08/2013: EF 45-50%.    Past Surgical History  Procedure Laterality Date  . Coronary artery bypass graft      LIMA to LAD, SVG to OM, SVG to left circumflex, SVG to  PD/PLSA  . Pacemaker insertion      2007, Medtronic dual-chamber  . Kidney cyst removal    . Doppler echocardiography  2011  . Cholecystectomy    . Vertebroplasty    . Cataract extraction w/ intraocular lens  implant, bilateral    . Hip arthroplasty Right 12/10/2013    Procedure: Right Hip Cemented Hemiarthroplasty;  Surgeon: Eldred MangesMark C Yates, MD;  [facility];  Service: Orthopedics;  Laterality: Right;  Right Hip Cemented Hemiarthroplasty  . Colonoscopy      Social History Dwayne FactorLarry E [name]  reports that he has never smoked. He has never used smokeless tobacco. He reports that he does not drink alcohol or use illicit drugs.  family history includes Stroke in his father. There is no history of Cancer or Heart disease.  Allergies  Allergen Reactions  . Ambien [Zolpidem Tartrate] Other (See Comments)    unknown  . Asparaginase Derivatives Other (See Comments)    Makes jittery & causes vision problems, headaches  . Caffeine Other (See Comments)    Interrupts sleep  . Clindamycin     REACTION: upset stomach  . Doxycycline Hyclate     REACTION: n/v/d  . Erythromycin     REACTION: n/v/d  . Lactose Intolerance (Gi) Other (See Comments)    unknown  . Lyrica [Pregabalin] Other (See Comments)    unknown  . Metronidazole     REACTION: rash, itching  . Nitrostat [  Nitroglycerin] Diarrhea  . Penicillins     REACTION: hives  . Sulfonamide Derivatives     REACTION: hives  . Tetracycline     REACTION: n/v/d       PHYSICAL EXAMINATION: Vital signs: BP 100/62  Pulse 72  Ht 5\' 9"  (1.753 m)  Wt 165 lb (74.844 kg)  BMI 24.36 kg/m2 General: Chronically ill appearing/frail, well-nourished, no acute distress. Kyphotic with walker HEENT: Sclerae are anicteric, conjunctiva pink. Oral mucosa intact. Hard of hearing Lungs: Clear Heart: Regular Abdomen: soft, nontender, nondistended, no obvious ascites, no peritoneal signs, normal bowel sounds. No organomegaly. Extremities: 1+ edema  bilaterally Psychiatric: alert and oriented x3. Cooperative   ASSESSMENT:  #1. Recent hospitalization with acute GI bleed, likely diverticular. Resolved #2. Anemia with suggestion of iron deficiency #3. Colonoscopy 10 years ago with diverticulosis only #4. MULTIPLE SIGNIFICANT medical problems.  PLAN:  #1. I discussed with the patient and his wife the risks, benefits, and alternatives of colonoscopy. As alternative diagnostic study, virtual colonoscopy discussed. At this point, patient does not want to have anything else done. He tells me "you can fix it if it aint broken". I do understand that neoplastic lesion could be missed. However, given his age and comorbidities, think is decision is not unreasonable. Thankfully, he has had no further bleeding and his hemoglobin has normalized. He'll return to the care of his primary providers. GI followup as needed

## 2014-05-07 NOTE — Patient Instructions (Signed)
Please follow up with Dr. Perry as needed 

## 2014-05-09 DIAGNOSIS — R269 Unspecified abnormalities of gait and mobility: Secondary | ICD-10-CM | POA: Diagnosis not present

## 2014-05-09 DIAGNOSIS — M6281 Muscle weakness (generalized): Secondary | ICD-10-CM | POA: Diagnosis not present

## 2014-05-09 DIAGNOSIS — Z96649 Presence of unspecified artificial hip joint: Secondary | ICD-10-CM | POA: Diagnosis not present

## 2014-05-14 DIAGNOSIS — M6281 Muscle weakness (generalized): Secondary | ICD-10-CM | POA: Diagnosis not present

## 2014-05-14 DIAGNOSIS — Z96649 Presence of unspecified artificial hip joint: Secondary | ICD-10-CM | POA: Diagnosis not present

## 2014-05-14 DIAGNOSIS — R269 Unspecified abnormalities of gait and mobility: Secondary | ICD-10-CM | POA: Diagnosis not present

## 2014-05-16 DIAGNOSIS — Z96649 Presence of unspecified artificial hip joint: Secondary | ICD-10-CM | POA: Diagnosis not present

## 2014-05-16 DIAGNOSIS — R269 Unspecified abnormalities of gait and mobility: Secondary | ICD-10-CM | POA: Diagnosis not present

## 2014-05-16 DIAGNOSIS — M6281 Muscle weakness (generalized): Secondary | ICD-10-CM | POA: Diagnosis not present

## 2014-05-21 ENCOUNTER — Telehealth: Payer: Self-pay | Admitting: Internal Medicine

## 2014-05-21 NOTE — Telephone Encounter (Signed)
Left message for patient to call back  

## 2014-05-21 NOTE — Telephone Encounter (Signed)
Pts wife states he has been having diarrhea for 1 week. Has been taking pepto, and imodium but nothing has helped. Pt scheduled to see Doug Sou PA Thursday at 2pm. Pt aware of appt.

## 2014-05-22 ENCOUNTER — Telehealth: Payer: Self-pay | Admitting: *Deleted

## 2014-05-22 DIAGNOSIS — M25569 Pain in unspecified knee: Secondary | ICD-10-CM

## 2014-05-22 NOTE — Telephone Encounter (Signed)
pts wife called states pts right leg is worsening.  Causing more pain during PT.  Requesting an Xray.  Please advise

## 2014-05-23 ENCOUNTER — Ambulatory Visit (INDEPENDENT_AMBULATORY_CARE_PROVIDER_SITE_OTHER): Payer: Medicare Other | Admitting: Gastroenterology

## 2014-05-23 ENCOUNTER — Encounter: Payer: Self-pay | Admitting: Gastroenterology

## 2014-05-23 ENCOUNTER — Other Ambulatory Visit: Payer: Medicare Other

## 2014-05-23 VITALS — BP 112/70 | HR 64 | Ht 69.0 in | Wt 165.4 lb

## 2014-05-23 DIAGNOSIS — R269 Unspecified abnormalities of gait and mobility: Secondary | ICD-10-CM | POA: Diagnosis not present

## 2014-05-23 DIAGNOSIS — R197 Diarrhea, unspecified: Secondary | ICD-10-CM | POA: Diagnosis not present

## 2014-05-23 DIAGNOSIS — I25810 Atherosclerosis of coronary artery bypass graft(s) without angina pectoris: Secondary | ICD-10-CM

## 2014-05-23 DIAGNOSIS — M6281 Muscle weakness (generalized): Secondary | ICD-10-CM | POA: Diagnosis not present

## 2014-05-23 DIAGNOSIS — Z96659 Presence of unspecified artificial knee joint: Secondary | ICD-10-CM | POA: Diagnosis not present

## 2014-05-23 NOTE — Progress Notes (Signed)
     05/23/2014 Dwayne [name] [id number] [birth year]   History of Present Illness:  This is a pleasant 80 year old male who is known to Dr. Marina Goodell and was seen in March for followup regarding a lower GI bleed. He presents to our office today with his wife for complaints of diarrhea. He reports that this diarrhea came on suddenly about 1.5 weeks ago.  He reports that he has not been on any antibiotics recently but several weeks ago (not sure exactly how long ago) he was given a dose of antibiotics prior to dental procedure. No new medications have been started except for Eliquis.  He states that Imodium and Kaopectate did not help his symptoms, but Pepto-Bismol seems to be calming things down if he takes it regularly several times throughout the day. He denies seeing any more blood in his stool and denies any abdominal pain. Most recent hemoglobin in early May was much improved to 12.7 grams.  Current Medications, Allergies, Past Medical History, Past Surgical History, Family History and Social History were reviewed in [employer] record.   Physical Exam: BP 112/70  Pulse 64  Ht 5\' 9"  (1.753 m)  Wt 165 lb 6.4 oz (75.025 kg)  BMI 24.41 kg/m2 General:  Elderly frail white male in no acute distress Head: Normocephalic and atraumatic Eyes:  Sclerae anicteric, conjunctiva pink  Ears: Normal auditory acuity Lungs: Clear throughout to auscultation Heart: Regular rate and rhythm; murmur noted. Abdomen: Soft, non-distended.  Normal bowel sounds.  Non-tender. Musculoskeletal: Symmetrical with no gross deformities  Extremities: No edema  Neurological: Alert oriented x 4, grossly non-focal Psychological:  Alert and cooperative. Normal mood and affect  Assessment and Recommendations: -Diarrhea:  Acute onset and and currently presents for 1.5 weeks. Likely infectious in etiology. We will check a stool GI pathogen panel. If positive will treat accordingly, however if negative and he  continues to have symptoms may consider a seven-day course of Flagyl empirically. The patient prefers to wait for the results before starting Flagyl or other treatment. He will continue with Pepto-Bismol in the interim.

## 2014-05-23 NOTE — Patient Instructions (Signed)
Your physician has requested that you go to the basement for the following lab work before leaving today: Pathogen Stool Panel

## 2014-05-24 ENCOUNTER — Other Ambulatory Visit: Payer: Medicare Other

## 2014-05-24 ENCOUNTER — Ambulatory Visit (INDEPENDENT_AMBULATORY_CARE_PROVIDER_SITE_OTHER): Payer: Medicare Other

## 2014-05-24 ENCOUNTER — Ambulatory Visit (INDEPENDENT_AMBULATORY_CARE_PROVIDER_SITE_OTHER)
Admission: RE | Admit: 2014-05-24 | Discharge: 2014-05-24 | Disposition: A | Discharge status: Home / Self Care | Payer: Medicare Other | Source: Ambulatory Visit | Attending: Internal Medicine | Admitting: Internal Medicine

## 2014-05-24 DIAGNOSIS — M25569 Pain in unspecified knee: Secondary | ICD-10-CM

## 2014-05-24 DIAGNOSIS — R197 Diarrhea, unspecified: Secondary | ICD-10-CM

## 2014-05-24 DIAGNOSIS — M79609 Pain in unspecified limb: Secondary | ICD-10-CM | POA: Diagnosis not present

## 2014-05-24 DIAGNOSIS — D518 Other vitamin B12 deficiency anemias: Secondary | ICD-10-CM | POA: Diagnosis not present

## 2014-05-24 MED ORDER — CYANOCOBALAMIN 1000 MCG/ML IJ SOLN
1000.0000 ug | Freq: Once | INTRAMUSCULAR | Status: AC
  Administered 2014-05-24: 1000 ug via INTRAMUSCULAR

## 2014-05-24 NOTE — Progress Notes (Signed)
Agree. Flagyl may be helpful even if GI pathogen panel negative

## 2014-05-24 NOTE — Telephone Encounter (Signed)
MD out of the office, pt came in for B12 and requested xray of right leg.

## 2014-05-27 ENCOUNTER — Telehealth: Payer: Self-pay | Admitting: Gastroenterology

## 2014-05-27 LAB — GASTROINTESTINAL PATHOGEN PANEL PCR
C. DIFFICILE TOX A/B, PCR: NEGATIVE
Campylobacter, PCR: NEGATIVE
Cryptosporidium, PCR: NEGATIVE
E COLI (STEC) STX1/STX2, PCR: NEGATIVE
E coli (ETEC) LT/ST PCR: NEGATIVE
E coli 0157, PCR: NEGATIVE
Giardia lamblia, PCR: NEGATIVE
NOROVIRUS, PCR: NEGATIVE
Rotavirus A, PCR: NEGATIVE
SALMONELLA, PCR: NEGATIVE
SHIGELLA, PCR: NEGATIVE

## 2014-05-27 NOTE — Telephone Encounter (Signed)
Pt was seen by Doug Sou PA last week. Pts wife thinks the diarrhea he is having might have been caused by some Keflex that he had to take prior to a dental procedure. Pt has an upcoming procedure and will have to take the Keflex  again this week. Discussed with pt that the results from the GI pathogen panel are not back yet.

## 2014-05-28 ENCOUNTER — Other Ambulatory Visit: Payer: Self-pay | Admitting: *Deleted

## 2014-05-28 MED ORDER — CIPROFLOXACIN HCL 500 MG PO TABS: ORAL_TABLET | ORAL | Status: DC

## 2014-06-03 ENCOUNTER — Telehealth: Payer: Self-pay | Admitting: Internal Medicine

## 2014-06-03 NOTE — Telephone Encounter (Signed)
Patient's spouse is calling to ask for results of his knee x-ray. Please advise.

## 2014-06-03 NOTE — Telephone Encounter (Signed)
Patient wife notified of results.

## 2014-06-03 NOTE — Telephone Encounter (Signed)
normal

## 2014-06-04 ENCOUNTER — Telehealth: Payer: Self-pay

## 2014-06-04 NOTE — Telephone Encounter (Signed)
lmtcb

## 2014-06-04 NOTE — Telephone Encounter (Signed)
Pt calling for x-ray results, could you annotate on those please

## 2014-06-04 NOTE — Telephone Encounter (Signed)
FINDINGS:  Small surgical clips in the medial lower extremity from previous  vein harvest. Widespread calcified atherosclerosis in the visible  right lower extremity. Bone mineralization is within normal limits  for age. Alignment at the right knee and ankle are preserved. The  calcaneus appears intact. No acute fracture of the right tibia or  fibula.  IMPRESSION:  No acute osseous abnormality identified at the right tib-fib.  Calcified peripheral vascular disease

## 2014-06-05 NOTE — Telephone Encounter (Signed)
Pt.notified

## 2014-06-06 DIAGNOSIS — M19049 Primary osteoarthritis, unspecified hand: Secondary | ICD-10-CM | POA: Diagnosis not present

## 2014-06-18 ENCOUNTER — Telehealth: Payer: Self-pay

## 2014-06-18 ENCOUNTER — Ambulatory Visit (INDEPENDENT_AMBULATORY_CARE_PROVIDER_SITE_OTHER): Payer: Medicare Other

## 2014-06-18 DIAGNOSIS — D518 Other vitamin B12 deficiency anemias: Secondary | ICD-10-CM

## 2014-06-18 MED ORDER — CYANOCOBALAMIN 1000 MCG/ML IJ SOLN
1000.0000 ug | Freq: Once | INTRAMUSCULAR | Status: AC
  Administered 2014-06-18: 1000 ug via INTRAMUSCULAR

## 2014-06-18 MED ORDER — DOXEPIN HCL 6 MG PO TABS: 1.0000 | ORAL_TABLET | Freq: Every evening | ORAL | Status: DC | PRN

## 2014-06-18 NOTE — Telephone Encounter (Signed)
Try silenor 

## 2014-06-18 NOTE — Telephone Encounter (Signed)
Patient stopped by for b12 injection and wanted MD to know that Trazadone makes him "really aggressive". He would like to try something different. Thanks

## 2014-06-19 NOTE — Telephone Encounter (Signed)
Pt notified//LMOVM 

## 2014-06-25 DIAGNOSIS — M25569 Pain in unspecified knee: Secondary | ICD-10-CM | POA: Diagnosis not present

## 2014-06-26 ENCOUNTER — Other Ambulatory Visit: Payer: Self-pay | Admitting: Orthopaedic Surgery

## 2014-06-26 ENCOUNTER — Telehealth: Payer: Self-pay

## 2014-06-26 DIAGNOSIS — M25561 Pain in right knee: Secondary | ICD-10-CM

## 2014-06-26 DIAGNOSIS — N3941 Urge incontinence: Secondary | ICD-10-CM | POA: Diagnosis not present

## 2014-06-26 DIAGNOSIS — N401 Enlarged prostate with lower urinary tract symptoms: Secondary | ICD-10-CM | POA: Diagnosis not present

## 2014-06-26 DIAGNOSIS — G4700 Insomnia, unspecified: Secondary | ICD-10-CM | POA: Insufficient documentation

## 2014-06-26 DIAGNOSIS — N3000 Acute cystitis without hematuria: Secondary | ICD-10-CM | POA: Diagnosis not present

## 2014-06-26 DIAGNOSIS — N139 Obstructive and reflux uropathy, unspecified: Secondary | ICD-10-CM | POA: Diagnosis not present

## 2014-06-26 MED ORDER — SUVOREXANT 10 MG PO TABS: 1.0000 | ORAL_TABLET | Freq: Every evening | ORAL | Status: DC | PRN

## 2014-06-26 NOTE — Telephone Encounter (Signed)
Change to belsomra

## 2014-06-26 NOTE — Telephone Encounter (Signed)
Received pharmacy rejection stating that insurance will not cover Silenor without a prior authorization. Preferred alternatives are trazodone and belsomra. Pt has tried and failed trazodone, will proceed with PA request, please provide a dx for PA. Thanks

## 2014-06-27 ENCOUNTER — Other Ambulatory Visit: Payer: Self-pay | Admitting: Certified Registered Nurse Anesthetist

## 2014-06-27 ENCOUNTER — Telehealth: Payer: Self-pay | Admitting: Certified Registered Nurse Anesthetist

## 2014-06-27 DIAGNOSIS — G4700 Insomnia, unspecified: Secondary | ICD-10-CM

## 2014-06-27 MED ORDER — SUVOREXANT 10 MG PO TABS: 1.0000 | ORAL_TABLET | Freq: Every evening | ORAL | Status: DC | PRN

## 2014-06-27 NOTE — Telephone Encounter (Signed)
Left message. Was calling [name] to inform him we switched his sleep medication to Belsomra.  I wanted to confirm that he wanted [facility] [address]

## 2014-06-27 NOTE — Telephone Encounter (Signed)
Pt called back.  [facility] on [address]

## 2014-06-28 ENCOUNTER — Telehealth: Payer: Self-pay | Admitting: Internal Medicine

## 2014-06-28 NOTE — Telephone Encounter (Signed)
Message from [name] wanting to know if can stop his Eliquis 2 days prior to CT and biopsy. Dr. Ladona Ridgelaylor not in office until next week so checked with [name], pharmacist.  She OK stopping Eliquis 2 days prior since he has not has a stroke.  Faxed note over to Sherri at [phone]. Lm on her machine that Rx had been faxed.

## 2014-06-28 NOTE — Telephone Encounter (Signed)
[name] is schedule for [facility] Arthro Gram on 07-05-14 @ [facility].  Need to see if he can come off blood thinner x 2 days prior to procedure. If so need this in written.  Please fax to [phone] Maudie MercuryAtten Sherri.

## 2014-07-01 ENCOUNTER — Telehealth: Payer: Self-pay | Admitting: Internal Medicine

## 2014-07-01 NOTE — Telephone Encounter (Signed)
Rx reprinted and faxed to [facility] [address]

## 2014-07-01 NOTE — Telephone Encounter (Signed)
Pt call back and stated that they found the script for cipro (cancel this request).

## 2014-07-01 NOTE — Telephone Encounter (Signed)
Can not e-scribe Belsomra due to being a schedule 2 medication. Thanks

## 2014-07-01 NOTE — Telephone Encounter (Signed)
Alvy BealLakisha, Can we escribe this?

## 2014-07-01 NOTE — Telephone Encounter (Signed)
Patient lost script for cipro.  Patient is to take cipro at 1pm before dental appt today.  He is requesting a [facility] [address].

## 2014-07-04 NOTE — Telephone Encounter (Signed)
Close encounter 

## 2014-07-05 ENCOUNTER — Ambulatory Visit
Admission: RE | Admit: 2014-07-05 | Discharge: 2014-07-05 | Disposition: A | Discharge status: Home / Self Care | Payer: Medicare Other | Source: Ambulatory Visit | Attending: Orthopaedic Surgery | Admitting: Orthopaedic Surgery

## 2014-07-05 DIAGNOSIS — S99919A Unspecified injury of unspecified ankle, initial encounter: Secondary | ICD-10-CM | POA: Diagnosis not present

## 2014-07-05 DIAGNOSIS — S99929A Unspecified injury of unspecified foot, initial encounter: Secondary | ICD-10-CM | POA: Diagnosis not present

## 2014-07-05 DIAGNOSIS — S8990XA Unspecified injury of unspecified lower leg, initial encounter: Secondary | ICD-10-CM | POA: Diagnosis not present

## 2014-07-05 DIAGNOSIS — M25561 Pain in right knee: Secondary | ICD-10-CM

## 2014-07-05 DIAGNOSIS — M25569 Pain in unspecified knee: Secondary | ICD-10-CM | POA: Diagnosis not present

## 2014-07-05 MED ORDER — IOHEXOL 180 MG/ML  SOLN
40.0000 mL | Freq: Once | INTRAMUSCULAR | Status: AC | PRN
  Administered 2014-07-05: 40 mL via INTRA_ARTICULAR

## 2014-07-15 ENCOUNTER — Ambulatory Visit: Payer: Medicare Other | Admitting: Gastroenterology

## 2014-07-16 ENCOUNTER — Telehealth: Payer: Self-pay | Admitting: Cardiology

## 2014-07-16 DIAGNOSIS — M1710 Unilateral primary osteoarthritis, unspecified knee: Secondary | ICD-10-CM | POA: Diagnosis not present

## 2014-07-16 NOTE — Telephone Encounter (Signed)
Spoke with pt wife, aware the use of valtaren and eliquis can increase bleeding time. This note will be faxed to [facility] at the wives request

## 2014-07-16 NOTE — Telephone Encounter (Signed)
New Message  Pt is taking Voltaren gel for the Cartilage behind his right knee. Request a call back to confirm that this is ok with eliquis. Pt requests that the answer is faxed to    [facility]  [phone] Attn: Dr. Cleophas DunkerWhitfield

## 2014-07-18 DIAGNOSIS — N3000 Acute cystitis without hematuria: Secondary | ICD-10-CM | POA: Diagnosis not present

## 2014-07-18 DIAGNOSIS — N3941 Urge incontinence: Secondary | ICD-10-CM | POA: Diagnosis not present

## 2014-07-24 DIAGNOSIS — M810 Age-related osteoporosis without current pathological fracture: Secondary | ICD-10-CM | POA: Diagnosis not present

## 2014-07-24 DIAGNOSIS — Z79899 Other long term (current) drug therapy: Secondary | ICD-10-CM | POA: Diagnosis not present

## 2014-07-24 DIAGNOSIS — M1710 Unilateral primary osteoarthritis, unspecified knee: Secondary | ICD-10-CM | POA: Diagnosis not present

## 2014-07-31 ENCOUNTER — Other Ambulatory Visit: Payer: Self-pay | Admitting: Internal Medicine

## 2014-07-31 DIAGNOSIS — M1710 Unilateral primary osteoarthritis, unspecified knee: Secondary | ICD-10-CM | POA: Diagnosis not present

## 2014-08-01 ENCOUNTER — Other Ambulatory Visit: Payer: Self-pay

## 2014-08-01 DIAGNOSIS — Z79899 Other long term (current) drug therapy: Secondary | ICD-10-CM

## 2014-08-01 MED ORDER — APIXABAN 5 MG PO TABS: 5.0000 mg | ORAL_TABLET | Freq: Two times a day (BID) | ORAL | Status: DC

## 2014-08-02 ENCOUNTER — Ambulatory Visit (INDEPENDENT_AMBULATORY_CARE_PROVIDER_SITE_OTHER): Payer: Medicare Other | Admitting: Cardiology

## 2014-08-02 ENCOUNTER — Encounter: Payer: Self-pay | Admitting: Cardiology

## 2014-08-02 VITALS — BP 90/62 | HR 71 | Ht 67.5 in | Wt 161.0 lb

## 2014-08-02 DIAGNOSIS — E785 Hyperlipidemia, unspecified: Secondary | ICD-10-CM | POA: Diagnosis not present

## 2014-08-02 DIAGNOSIS — Z950 Presence of cardiac pacemaker: Secondary | ICD-10-CM

## 2014-08-02 DIAGNOSIS — I2510 Atherosclerotic heart disease of native coronary artery without angina pectoris: Secondary | ICD-10-CM

## 2014-08-02 DIAGNOSIS — I4891 Unspecified atrial fibrillation: Secondary | ICD-10-CM

## 2014-08-02 DIAGNOSIS — I25810 Atherosclerosis of coronary artery bypass graft(s) without angina pectoris: Secondary | ICD-10-CM | POA: Diagnosis not present

## 2014-08-02 DIAGNOSIS — I10 Essential (primary) hypertension: Secondary | ICD-10-CM | POA: Diagnosis not present

## 2014-08-02 NOTE — Assessment & Plan Note (Signed)
Continue statin. 

## 2014-08-02 NOTE — Progress Notes (Signed)
HPI: FU CAD s/p CABG, atrial fibrillation, HTN. Per notes, he had CABG 1993 with LIMA- LAD, SVG to OM, SVG to left circumflex, SVG to PD/PLSA. He had PCI of the saphenous vein graft to his obtuse marginal in 2003. Abdominal CT in June 2011 showed no abdominal aortic aneurysm. He was hospitalized February 2015 for GIB with Hgb down to 7.6 (was 11-12 in December). He was treated with Vit K and PRBCs. Coumadin was held. Aspirin was resumed. Last echocardiogram April 2015 showed an ejection fraction of 50-55%, mild mitral regurgitation and trace aortic insufficiency. At last office visit we initiated apixaban. Since he was last seen, He denies dyspnea, chest pain, palpitations, syncope or bleeding.   Current Outpatient Prescriptions  Medication Sig Dispense Refill  . apixaban (ELIQUIS) 5 MG TABS tablet Take 1 tablet (5 mg total) by mouth 2 (two) times daily.  60 tablet  6  . bismuth subsalicylate (PEPTO BISMOL) 262 MG/15ML suspension Take 30 mLs by mouth as needed.      . Calcium-Magnesium-Vitamin D (CITRACAL CALCIUM+D) 600-40-500 MG-MG-UNIT TB24 Take 1 tablet by mouth 2 (two) times daily.       . cephALEXin (KEFLEX) 500 MG capsule Take 1 capsule (500 mg total) by mouth once. Take 2000 mg po one hour prior to dental proceduer  4 capsule  2  . ciprofloxacin (CIPRO) 500 MG tablet Take one po BID x 7 days  14 tablet  0  . CRESTOR 10 MG tablet Take 1 tablet by mouth   daily  90 tablet  3  . Cyanocobalamin (VITAMIN B-12 IJ) Inject as directed every 21 ( twenty-one) days.      . ferrous sulfate 325 (65 FE) MG tablet Take 1 tablet (325 mg total) by mouth 2 (two) times daily with a meal.  180 tablet  1  . fexofenadine (ALLEGRA) 180 MG tablet Take 180 mg by mouth at bedtime as needed (for allergies).       . finasteride (PROSCAR) 5 MG tablet Take 1 tablet (5 mg total) by mouth at bedtime.  90 tablet  3  . metoprolol tartrate (LOPRESSOR) 25 MG tablet Take 1 tablet (25 mg total) by mouth 2 (two) times  daily.  180 tablet  6  . Multiple Vitamins-Minerals (OCUVITE-LUTEIN PO) Take 1 tablet by mouth daily.       . nitroGLYCERIN (NITROSTAT) 0.4 MG SL tablet Place 0.4 mg under the tongue every 5 (five) minutes x 3 doses as needed for chest pain.      [name] Kitchen. omeprazole (PRILOSEC) 40 MG capsule Take 40 mg by mouth daily.        . polyethylene glycol (MIRALAX / GLYCOLAX) packet Take 17 g by mouth daily.      [name] Kitchen. PROLIA 60 MG/ML SOLN injection Inject 60 mg into the skin every 6 (six) months.       . Suvorexant (BELSOMRA) 10 MG TABS Take 1 tablet by mouth at bedtime as needed.  30 tablet  5  . terazosin (HYTRIN) 5 MG capsule Take 1 capsule (5 mg total) by mouth daily.  90 capsule  3   No current facility-administered medications for this visit.     Past Medical History  Diagnosis Date  . Atrial fibrillation     a. Was on Coumadin until GIB 01/2014.  [name] Kitchen. Hyperlipidemia   . Bronchiectasis without acute exacerbation   . Disorders of diaphragm   . Plantar fascial fibromatosis   . Epistaxis   . Unspecified  sinusitis (chronic)   . Allergic rhinitis, cause unspecified   . Spinal stenosis, unspecified region other than cervical   . Hypertension   . Coronary artery disease     a. s/p CABG 1993. b. s/p PCI of SVG to OM '03.  . Tachy-brady syndrome     s/p Medtronic PPM '07  . Myocardial infarction   . GERD (gastroesophageal reflux disease)   . Arthritis   . H/O: GI bleed     a. 01/2014 - presumed diverticular. Received PRBC, Vit K. Coumadin put on hold.  . Diverticulosis   . LV dysfunction     a. Echo 08/2013: EF 45-50%.    Past Surgical History  Procedure Laterality Date  . Coronary artery bypass graft      LIMA to LAD, SVG to OM, SVG to left circumflex, SVG to PD/PLSA  . Pacemaker insertion      2007, Medtronic dual-chamber  . Kidney cyst removal    . Doppler echocardiography  2011  . Cholecystectomy    . Vertebroplasty    . Cataract extraction w/ intraocular lens  implant, bilateral    . Hip  arthroplasty Right 12/10/2013    Procedure: Right Hip Cemented Hemiarthroplasty;  Surgeon: Eldred Manges, MD;  Location: [facility] OR;  Service: Orthopedics;  Laterality: Right;  Right Hip Cemented Hemiarthroplasty  . Colonoscopy      History   Social History  . Marital Status: Married    Spouse Name: Ollen Gross    Number of Children: 4  . Years of Education: N/A   Occupational History  .     Social History Main Topics  . Smoking status: Former Games developer  . Smokeless tobacco: Never Used     Comment: x 6 months in high school  . Alcohol Use: No     Comment: none  . Drug Use: No  . Sexual Activity: No   Other Topics Concern  . Not on file   Social History Narrative   Married.  Ambulates with a walker.    ROS: no fevers or chills, productive cough, hemoptysis, dysphasia, odynophagia, melena, hematochezia, dysuria, hematuria, rash, seizure activity, orthopnea, PND, pedal edema, claudication. Remaining systems are negative.  Physical Exam: Well-developed well-nourished in no acute distress.  Skin is warm and dry.  HEENT is normal.  Neck is supple.  Chest is clear to auscultation with normal expansion.  Cardiovascular exam is regular rate and rhythm. 1/6 systolic murmur left sternal border. Abdominal exam nontender or distended. No masses palpated. Extremities show no edema. neuro grossly intact  ECG- ventricular pacing with underlying atrial fibrillation.

## 2014-08-02 NOTE — Assessment & Plan Note (Signed)
Continue present blood pressure medications. 

## 2014-08-02 NOTE — Patient Instructions (Signed)
Your physician wants you to follow-up in: 6 MONTHS WITH DR CRENSHAW You will receive a reminder letter in the mail two months in advance. If you don't receive a letter, please call our office to schedule the follow-up appointment.   Your physician recommends that you HAVE LAB WORK TODAY 

## 2014-08-02 NOTE — Assessment & Plan Note (Signed)
Followed by electrophysiology. 

## 2014-08-02 NOTE — Assessment & Plan Note (Signed)
Continue statin. Not on aspirin given need for anticoagulation. 

## 2014-08-02 NOTE — Assessment & Plan Note (Signed)
Patient remains in atrial fibrillation. Continue metoprolol for rate control. Continue apixaban. Check hemoglobin and renal function. I have again instructed him to follow closely for any evidence of GI bleeding. If he has recurrent events we would need to discontinue anticoagulation and treat with aspirin realizing there is increased risk of CVA. He understands the above and is agreeable. I will see him back in 6 months.

## 2014-08-03 LAB — BASIC METABOLIC PANEL WITH GFR
BUN: 19 mg/dL (ref 6–23)
CALCIUM: 9.1 mg/dL (ref 8.4–10.5)
CO2: 24 mEq/L (ref 19–32)
Chloride: 107 mEq/L (ref 96–112)
Creat: 1.04 mg/dL (ref 0.50–1.35)
GFR, EST AFRICAN AMERICAN: 78 mL/min
GFR, EST NON AFRICAN AMERICAN: 67 mL/min
Glucose, Bld: 102 mg/dL — ABNORMAL HIGH (ref 70–99)
POTASSIUM: 4.2 meq/L (ref 3.5–5.3)
SODIUM: 142 meq/L (ref 135–145)

## 2014-08-03 LAB — CBC
HCT: 35 % — ABNORMAL LOW (ref 39.0–52.0)
Hemoglobin: 12 g/dL — ABNORMAL LOW (ref 13.0–17.0)
MCH: 30.6 pg (ref 26.0–34.0)
MCHC: 34.3 g/dL (ref 30.0–36.0)
MCV: 89.3 fL (ref 78.0–100.0)
PLATELETS: 275 10*3/uL (ref 150–400)
RBC: 3.92 MIL/uL — ABNORMAL LOW (ref 4.22–5.81)
RDW: 14.8 % (ref 11.5–15.5)
WBC: 12.5 10*3/uL — ABNORMAL HIGH (ref 4.0–10.5)

## 2014-08-05 ENCOUNTER — Ambulatory Visit (INDEPENDENT_AMBULATORY_CARE_PROVIDER_SITE_OTHER): Payer: Medicare Other | Admitting: Internal Medicine

## 2014-08-05 ENCOUNTER — Encounter: Payer: Self-pay | Admitting: Internal Medicine

## 2014-08-05 ENCOUNTER — Encounter: Payer: Medicare Other | Admitting: *Deleted

## 2014-08-05 VITALS — BP 107/60 | HR 64 | Ht 70.5 in | Wt 161.0 lb

## 2014-08-05 DIAGNOSIS — I25810 Atherosclerosis of coronary artery bypass graft(s) without angina pectoris: Secondary | ICD-10-CM | POA: Diagnosis not present

## 2014-08-05 DIAGNOSIS — I4891 Unspecified atrial fibrillation: Secondary | ICD-10-CM | POA: Diagnosis not present

## 2014-08-05 DIAGNOSIS — I495 Sick sinus syndrome: Secondary | ICD-10-CM | POA: Diagnosis not present

## 2014-08-05 DIAGNOSIS — I2510 Atherosclerotic heart disease of native coronary artery without angina pectoris: Secondary | ICD-10-CM

## 2014-08-05 DIAGNOSIS — I480 Paroxysmal atrial fibrillation: Secondary | ICD-10-CM

## 2014-08-05 DIAGNOSIS — Z950 Presence of cardiac pacemaker: Secondary | ICD-10-CM | POA: Diagnosis not present

## 2014-08-05 LAB — MDC_IDC_ENUM_SESS_TYPE_INCLINIC

## 2014-08-05 NOTE — Patient Instructions (Signed)
Your physician has recommended a pacemaker generator change. Please follow instruction sheet.

## 2014-08-05 NOTE — Progress Notes (Signed)
HPI Mr. Barbuto returns today for followup. He is a very pleasant 80-year-old man with a history of chronic atrial fibrillation and complete heart block status post permanent pacemaker insertion. In the interim, he has been stable. He has reached elective replacement on his pacemaker. He denies syncope, chest pain, shortness of breath, or peripheral edema. Allergies  Allergen Reactions  . Ambien [Zolpidem Tartrate] Other (See Comments)    unknown  . Asparaginase Derivatives Other (See Comments)    Makes jittery & causes vision problems, headaches  . Caffeine Other (See Comments)    Interrupts sleep  . Clindamycin     REACTION: upset stomach  . Doxycycline Hyclate     REACTION: n/v/d  . Erythromycin     REACTION: n/v/d  . Lactose Intolerance (Gi) Other (See Comments)    unknown  . Lyrica [Pregabalin] Other (See Comments)    unknown  . Metronidazole     REACTION: rash, itching  . Nitrostat [Nitroglycerin] Diarrhea  . Penicillins     REACTION: hives  . Sulfonamide Derivatives     REACTION: hives  . Tetracycline     REACTION: n/v/d     Current Outpatient Prescriptions  Medication Sig Dispense Refill  . apixaban (ELIQUIS) 5 MG TABS tablet Take 1 tablet (5 mg total) by mouth 2 (two) times daily.  60 tablet  6  . bismuth subsalicylate (PEPTO BISMOL) 262 MG/15ML suspension Take 30 mLs by mouth as needed.      . Calcium-Magnesium-Vitamin D (CITRACAL CALCIUM+D) 600-40-500 MG-MG-UNIT TB24 Take 1 tablet by mouth 2 (two) times daily.       . cephALEXin (KEFLEX) 500 MG capsule Take 1 capsule (500 mg total) by mouth once. Take 2000 mg po one hour prior to dental proceduer  4 capsule  2  . ciprofloxacin (CIPRO) 500 MG tablet Take one po BID x 7 days  14 tablet  0  . CRESTOR 10 MG tablet Take 1 tablet by mouth   daily  90 tablet  3  . Cyanocobalamin (VITAMIN B-12 IJ) Inject as directed every 21 ( twenty-one) days.      . ferrous sulfate 325 (65 FE) MG tablet Take 1 tablet (325 mg total) by mouth  2 (two) times daily with a meal.  180 tablet  1  . fexofenadine (ALLEGRA) 180 MG tablet Take 180 mg by mouth at bedtime as needed (for allergies).       . finasteride (PROSCAR) 5 MG tablet Take 1 tablet (5 mg total) by mouth at bedtime.  90 tablet  3  . metoprolol tartrate (LOPRESSOR) 25 MG tablet Take 1 tablet (25 mg total) by mouth 2 (two) times daily.  180 tablet  6  . Multiple Vitamins-Minerals (OCUVITE-LUTEIN PO) Take 1 tablet by mouth daily.       . nitroGLYCERIN (NITROSTAT) 0.4 MG SL tablet Place 0.4 mg under the tongue every 5 (five) minutes x 3 doses as needed for chest pain.      . omeprazole (PRILOSEC) 40 MG capsule Take 40 mg by mouth daily.        . polyethylene glycol (MIRALAX / GLYCOLAX) packet Take 17 g by mouth daily.      . PROLIA 60 MG/ML SOLN injection Inject 60 mg into the skin every 6 (six) months.       . Suvorexant (BELSOMRA) 10 MG TABS Take 1 tablet by mouth at bedtime as needed.  30 tablet  5  . terazosin (HYTRIN) 5 MG capsule Take 1 capsule (5   mg total) by mouth daily.  90 capsule  3   No current facility-administered medications for this visit.     Past Medical History  Diagnosis Date  . Atrial fibrillation     a. Was on Coumadin until GIB 01/2014.  . Hyperlipidemia   . Bronchiectasis without acute exacerbation   . Disorders of diaphragm   . Plantar fascial fibromatosis   . Epistaxis   . Unspecified sinusitis (chronic)   . Allergic rhinitis, cause unspecified   . Spinal stenosis, unspecified region other than cervical   . Hypertension   . Coronary artery disease     a. s/p CABG 1993. b. s/p PCI of SVG to OM '03.  . Tachy-brady syndrome     s/p Medtronic PPM '07  . Myocardial infarction   . GERD (gastroesophageal reflux disease)   . Arthritis   . H/O: GI bleed     a. 01/2014 - presumed diverticular. Received PRBC, Vit K. Coumadin put on hold.  . Diverticulosis   . LV dysfunction     a. Echo 08/2013: EF 45-50%.    ROS:   All systems reviewed and  negative except as noted in the HPI.   Past Surgical History  Procedure Laterality Date  . Coronary artery bypass graft      LIMA to LAD, SVG to OM, SVG to left circumflex, SVG to PD/PLSA  . Pacemaker insertion      2007, Medtronic dual-chamber  . Kidney cyst removal    . Doppler echocardiography  2011  . Cholecystectomy    . Vertebroplasty    . Cataract extraction w/ intraocular lens  implant, bilateral    . Hip arthroplasty Right 12/10/2013    Procedure: Right Hip Cemented Hemiarthroplasty;  Surgeon: Mark C Yates, MD;  Location: [facility] OR;  Service: Orthopedics;  Laterality: Right;  Right Hip Cemented Hemiarthroplasty  . Colonoscopy       Family History  Problem Relation Age of Onset  . Colon cancer Neg Hx   . Heart disease Neg Hx   . Stroke Mother   . Throat cancer Neg Hx   . Pancreatic cancer Neg Hx   . Diabetes Son   . Kidney disease Neg Hx   . Liver disease Neg Hx      History   Social History  . Marital Status: Married    Spouse Name: [name]    Number of Children: 4  . Years of Education: N/A   Occupational History  .     Social History Main Topics  . Smoking status: Former Smoker  . Smokeless tobacco: Never Used     Comment: x 6 months in high school  . Alcohol Use: No     Comment: none  . Drug Use: No  . Sexual Activity: No   Other Topics Concern  . Not on file   Social History Narrative   Married.  Ambulates with a walker.     BP 107/60  Pulse 64  Ht 5' 10.5" (1.791 m)  Wt 161 lb (73.029 kg)  BMI 22.77 kg/m2  Physical Exam:  frail appearing 80-year-old man,NAD HEENT: Unremarkable Neck:  7 cm JVD, no thyromegally Back:  No CVA tenderness Lungs:  Clear with no wheezes, rales, or rhonchi. No increased work of breathing HEART:  Regular rate rhythm, no murmurs, no rubs, no clicks Abd:  soft, positive bowel sounds, no organomegally, no rebound, no guarding Ext:  2 plus pulses, no edema, no cyanosis, no clubbing Skin:  No   rashes no  nodules Neuro:  CN II through XII intact, motor grossly intact   DEVICE  Normal device function.  See PaceArt for details.   Assess/Plan: 

## 2014-08-05 NOTE — Assessment & Plan Note (Signed)
His Medtronic dual-chamber pacemaker is working normally. He has reverted to the VVI mode as his device has reached elective replacement. We'll schedule pacemaker generator removal and insertion of a new device in the next few weeks.

## 2014-08-05 NOTE — Assessment & Plan Note (Signed)
His ventricular rate is well controlled. No change in medical therapy. 

## 2014-08-05 NOTE — Assessment & Plan Note (Signed)
He has had no angina. Will follow.

## 2014-08-07 ENCOUNTER — Encounter (HOSPITAL_COMMUNITY): Payer: Self-pay | Admitting: Pharmacy Technician

## 2014-08-09 DIAGNOSIS — M1710 Unilateral primary osteoarthritis, unspecified knee: Secondary | ICD-10-CM | POA: Diagnosis not present

## 2014-08-13 MED ORDER — SODIUM CHLORIDE 0.9 % IR SOLN
80.0000 mg | Status: DC
  Filled 2014-08-13: qty 2

## 2014-08-13 MED ORDER — VANCOMYCIN HCL IN DEXTROSE 1-5 GM/200ML-% IV SOLN
1000.0000 mg | INTRAVENOUS | Status: DC
  Filled 2014-08-13: qty 200

## 2014-08-14 ENCOUNTER — Ambulatory Visit (HOSPITAL_COMMUNITY)
Admission: RE | Admit: 2014-08-14 | Discharge: 2014-08-14 | Disposition: A | Discharge status: Home / Self Care | Payer: Medicare Other | Source: Ambulatory Visit | Attending: Internal Medicine | Admitting: Internal Medicine

## 2014-08-14 ENCOUNTER — Encounter (HOSPITAL_COMMUNITY)
Admission: RE | Disposition: A | Discharge status: Home / Self Care | Payer: Self-pay | Source: Ambulatory Visit | Attending: Internal Medicine

## 2014-08-14 DIAGNOSIS — E785 Hyperlipidemia, unspecified: Secondary | ICD-10-CM | POA: Insufficient documentation

## 2014-08-14 DIAGNOSIS — K219 Gastro-esophageal reflux disease without esophagitis: Secondary | ICD-10-CM | POA: Insufficient documentation

## 2014-08-14 DIAGNOSIS — I10 Essential (primary) hypertension: Secondary | ICD-10-CM | POA: Insufficient documentation

## 2014-08-14 DIAGNOSIS — I252 Old myocardial infarction: Secondary | ICD-10-CM | POA: Diagnosis not present

## 2014-08-14 DIAGNOSIS — Z87891 Personal history of nicotine dependence: Secondary | ICD-10-CM | POA: Insufficient documentation

## 2014-08-14 DIAGNOSIS — Z951 Presence of aortocoronary bypass graft: Secondary | ICD-10-CM | POA: Diagnosis not present

## 2014-08-14 DIAGNOSIS — I4891 Unspecified atrial fibrillation: Secondary | ICD-10-CM | POA: Insufficient documentation

## 2014-08-14 DIAGNOSIS — Z45018 Encounter for adjustment and management of other part of cardiac pacemaker: Secondary | ICD-10-CM | POA: Diagnosis not present

## 2014-08-14 DIAGNOSIS — I2510 Atherosclerotic heart disease of native coronary artery without angina pectoris: Secondary | ICD-10-CM | POA: Insufficient documentation

## 2014-08-14 DIAGNOSIS — I495 Sick sinus syndrome: Secondary | ICD-10-CM | POA: Insufficient documentation

## 2014-08-14 DIAGNOSIS — Z7901 Long term (current) use of anticoagulants: Secondary | ICD-10-CM | POA: Diagnosis not present

## 2014-08-14 DIAGNOSIS — I442 Atrioventricular block, complete: Secondary | ICD-10-CM | POA: Insufficient documentation

## 2014-08-14 DIAGNOSIS — Z79899 Other long term (current) drug therapy: Secondary | ICD-10-CM | POA: Insufficient documentation

## 2014-08-14 HISTORY — PX: PERMANENT PACEMAKER GENERATOR CHANGE: SHX6022

## 2014-08-14 LAB — SURGICAL PCR SCREEN
MRSA, PCR: NEGATIVE
STAPHYLOCOCCUS AUREUS: POSITIVE — AB

## 2014-08-14 SURGERY — PERMANENT PACEMAKER GENERATOR CHANGE
Anesthesia: LOCAL

## 2014-08-14 MED ORDER — ONDANSETRON HCL 4 MG/2ML IJ SOLN: 4.0000 mg | Freq: Four times a day (QID) | INTRAMUSCULAR | Status: DC | PRN

## 2014-08-14 MED ORDER — CHLORHEXIDINE GLUCONATE 4 % EX LIQD
60.0000 mL | Freq: Once | CUTANEOUS | Status: DC
  Filled 2014-08-14: qty 60

## 2014-08-14 MED ORDER — SODIUM CHLORIDE 0.9 % IV SOLN
INTRAVENOUS | Status: DC
  Administered 2014-08-14: 1000 mL via INTRAVENOUS

## 2014-08-14 MED ORDER — MUPIROCIN 2 % EX OINT
TOPICAL_OINTMENT | CUTANEOUS | Status: AC
  Administered 2014-08-14: 1
  Filled 2014-08-14: qty 22

## 2014-08-14 MED ORDER — MUPIROCIN 2 % EX OINT: 1.0000 "application " | TOPICAL_OINTMENT | Freq: Once | CUTANEOUS | Status: DC

## 2014-08-14 MED ORDER — ACETAMINOPHEN 325 MG PO TABS
325.0000 mg | ORAL_TABLET | ORAL | Status: DC | PRN
  Filled 2014-08-14: qty 2

## 2014-08-14 NOTE — H&P (View-Only) (Signed)
HPI [name] returns today for followup. He is a very pleasant 80 year old man with a history of chronic atrial fibrillation and complete heart block status post permanent pacemaker insertion. In the interim, he has been stable. He has reached elective replacement on his pacemaker. He denies syncope, chest pain, shortness of breath, or peripheral edema. Allergies  Allergen Reactions  . Ambien [Zolpidem Tartrate] Other (See Comments)    unknown  . Asparaginase Derivatives Other (See Comments)    Makes jittery & causes vision problems, headaches  . Caffeine Other (See Comments)    Interrupts sleep  . Clindamycin     REACTION: upset stomach  . Doxycycline Hyclate     REACTION: n/v/d  . Erythromycin     REACTION: n/v/d  . Lactose Intolerance (Gi) Other (See Comments)    unknown  . Lyrica [Pregabalin] Other (See Comments)    unknown  . Metronidazole     REACTION: rash, itching  . Nitrostat [Nitroglycerin] Diarrhea  . Penicillins     REACTION: hives  . Sulfonamide Derivatives     REACTION: hives  . Tetracycline     REACTION: n/v/d     Current Outpatient Prescriptions  Medication Sig Dispense Refill  . apixaban (ELIQUIS) 5 MG TABS tablet Take 1 tablet (5 mg total) by mouth 2 (two) times daily.  60 tablet  6  . bismuth subsalicylate (PEPTO BISMOL) 262 MG/15ML suspension Take 30 mLs by mouth as needed.      . Calcium-Magnesium-Vitamin D (CITRACAL CALCIUM+D) 600-40-500 MG-MG-UNIT TB24 Take 1 tablet by mouth 2 (two) times daily.       . cephALEXin (KEFLEX) 500 MG capsule Take 1 capsule (500 mg total) by mouth once. Take 2000 mg po one hour prior to dental proceduer  4 capsule  2  . ciprofloxacin (CIPRO) 500 MG tablet Take one po BID x 7 days  14 tablet  0  . CRESTOR 10 MG tablet Take 1 tablet by mouth   daily  90 tablet  3  . Cyanocobalamin (VITAMIN B-12 IJ) Inject as directed every 21 ( twenty-one) days.      . ferrous sulfate 325 (65 FE) MG tablet Take 1 tablet (325 mg total) by mouth  2 (two) times daily with a meal.  180 tablet  1  . fexofenadine (ALLEGRA) 180 MG tablet Take 180 mg by mouth at bedtime as needed (for allergies).       . finasteride (PROSCAR) 5 MG tablet Take 1 tablet (5 mg total) by mouth at bedtime.  90 tablet  3  . metoprolol tartrate (LOPRESSOR) 25 MG tablet Take 1 tablet (25 mg total) by mouth 2 (two) times daily.  180 tablet  6  . Multiple Vitamins-Minerals (OCUVITE-LUTEIN PO) Take 1 tablet by mouth daily.       . nitroGLYCERIN (NITROSTAT) 0.4 MG SL tablet Place 0.4 mg under the tongue every 5 (five) minutes x 3 doses as needed for chest pain.      [name] Kitchen omeprazole (PRILOSEC) 40 MG capsule Take 40 mg by mouth daily.        . polyethylene glycol (MIRALAX / GLYCOLAX) packet Take 17 g by mouth daily.      [name] Kitchen PROLIA 60 MG/ML SOLN injection Inject 60 mg into the skin every 6 (six) months.       . Suvorexant (BELSOMRA) 10 MG TABS Take 1 tablet by mouth at bedtime as needed.  30 tablet  5  . terazosin (HYTRIN) 5 MG capsule Take 1 capsule (5  mg total) by mouth daily.  90 capsule  3   No current facility-administered medications for this visit.     Past Medical History  Diagnosis Date  . Atrial fibrillation     a. Was on Coumadin until GIB 01/2014.  [name] Kitchen Hyperlipidemia   . Bronchiectasis without acute exacerbation   . Disorders of diaphragm   . Plantar fascial fibromatosis   . Epistaxis   . Unspecified sinusitis (chronic)   . Allergic rhinitis, cause unspecified   . Spinal stenosis, unspecified region other than cervical   . Hypertension   . Coronary artery disease     a. s/p CABG 1993. b. s/p PCI of SVG to OM '03.  . Tachy-brady syndrome     s/p Medtronic PPM '07  . Myocardial infarction   . GERD (gastroesophageal reflux disease)   . Arthritis   . H/O: GI bleed     a. 01/2014 - presumed diverticular. Received PRBC, Vit K. Coumadin put on hold.  . Diverticulosis   . LV dysfunction     a. Echo 08/2013: EF 45-50%.    ROS:   All systems reviewed and  negative except as noted in the HPI.   Past Surgical History  Procedure Laterality Date  . Coronary artery bypass graft      LIMA to LAD, SVG to OM, SVG to left circumflex, SVG to PD/PLSA  . Pacemaker insertion      2007, Medtronic dual-chamber  . Kidney cyst removal    . Doppler echocardiography  2011  . Cholecystectomy    . Vertebroplasty    . Cataract extraction w/ intraocular lens  implant, bilateral    . Hip arthroplasty Right 12/10/2013    Procedure: Right Hip Cemented Hemiarthroplasty;  Surgeon: Eldred Manges, MD;  Location: [facility] OR;  Service: Orthopedics;  Laterality: Right;  Right Hip Cemented Hemiarthroplasty  . Colonoscopy       Family History  Problem Relation Age of Onset  . Colon cancer Neg Hx   . Heart disease Neg Hx   . Stroke Mother   . Throat cancer Neg Hx   . Pancreatic cancer Neg Hx   . Diabetes Son   . Kidney disease Neg Hx   . Liver disease Neg Hx      History   Social History  . Marital Status: Married    Spouse Name: Ollen Gross    Number of Children: 4  . Years of Education: N/A   Occupational History  .     Social History Main Topics  . Smoking status: Former Games developer  . Smokeless tobacco: Never Used     Comment: x 6 months in high school  . Alcohol Use: No     Comment: none  . Drug Use: No  . Sexual Activity: No   Other Topics Concern  . Not on file   Social History Narrative   Married.  Ambulates with a walker.     BP 107/60  Pulse 64  Ht 5' 10.5" (1.791 m)  Wt 161 lb (73.029 kg)  BMI 22.77 kg/m2  Physical Exam:  frail appearing 80 year old man,NAD HEENT: Unremarkable Neck:  7 cm JVD, no thyromegally Back:  No CVA tenderness Lungs:  Clear with no wheezes, rales, or rhonchi. No increased work of breathing HEART:  Regular rate rhythm, no murmurs, no rubs, no clicks Abd:  soft, positive bowel sounds, no organomegally, no rebound, no guarding Ext:  2 plus pulses, no edema, no cyanosis, no clubbing Skin:  No  rashes no  nodules Neuro:  CN II through XII intact, motor grossly intact   DEVICE  Normal device function.  See PaceArt for details.   Assess/Plan:

## 2014-08-14 NOTE — Discharge Instructions (Signed)
Pacemaker Battery Change, Care After °Refer to this sheet in the next few weeks. These instructions provide you with information on caring for yourself after your procedure. Your health care provider may also give you more specific instructions. Your treatment has been planned according to current medical practices, but problems sometimes occur. Call your health care provider if you have any problems or questions after your procedure. °WHAT TO EXPECT AFTER THE PROCEDURE °After your procedure, it is typical to have the following sensations: °· Soreness at the pacemaker site. °HOME CARE INSTRUCTIONS  °· Keep the incision clean and dry. °· Unless advised otherwise, you may shower beginning 48 hours after your procedure. °· For the first week after the replacement, avoid stretching motions that pull at the incision site, and avoid heavy exercise with the arm that is on the same side as the incision. °· Take medicines only as directed by your health care provider. °· Keep all follow-up visits as directed by your health care provider. °SEEK MEDICAL CARE IF:  °· You have pain at the incision site that is not relieved by over-the-counter or prescription medicine. °· There is drainage or pus from the incision site. °· There is swelling larger than a lime at the incision site. °· You develop red streaking that extends above or below the incision site. °· You feel brief, intermittent palpitations, light-headedness, or any symptoms that you feel might be related to your heart. °SEEK IMMEDIATE MEDICAL CARE IF:  °· You experience chest pain that is different than the pain at the pacemaker site. °· You experience shortness of breath. °· You have palpitations or irregular heartbeat. °· You have light-headedness that does not go away quickly. °· You faint. °· You have pain that gets worse and is not relieved by medicine. °Document Released: 09/26/2013 Document Revised: 04/22/2014 Document Reviewed: 09/26/2013 °[employer]® Patient  Information ©2015 [employer]. This information is not intended to replace advice given to you by your health care provider. Make sure you discuss any questions you have with your health care provider. ° °

## 2014-08-14 NOTE — CV Procedure (Signed)
Electrophysiology procedure note  Procedure: removal of a previously implanted dual-chamber pacemaker which had reached elective replacement, and insertion of a new dual-chamber pacemaker  Indication: Symptomatic sinus node dysfunction with intermittent complete heart block  Description of the procedure: After informed consent was obtained, the patient was taken to the diagnostic electrophysiology laboratory in the fasting state. After the usual preparation and draping, 30 cc of lidocaine was infiltrated into the left infraclavicular region. A 5 cm incision was carried out. Electrocautery was utilized to dissect down to the pacemaker pocket. The generator was removed. The atrial and ventricular leads were evaluated, and found to be working satisfactorily. Electrocautery was utilized to assure hemostasis. The pocket was irrigated. The old Medtronic dual-chamber pacemaker was removed, and the new Medtronic dual-chamber pacemaker, serial number [id number] was connected to the old leads and placed back in the subcutaneous pocket. The pocket was irrigated with antibiotic irrigation. The incision was closed with 2 layers of Vicryl suture. Benzoin and Steri-Strips of pain on the skin. The patient was returned to his room in satisfactory condition.  Complications: There were no immediate consultations  Conclusion: Successful removal of a previous implanted dual-chamber pacemaker which had reached elective replacement, and insertion of a new dual-chamber pacemaker in a patient with symptomatic sinus node dysfunction and intermittent complete heart block.  [name], M.D.

## 2014-08-14 NOTE — Interval H&P Note (Signed)
History and Physical Interval Note:  08/14/2014 3:11 PM  Dwayne [name]  has presented today for surgery, with the diagnosis of eri  The various methods of treatment have been discussed with the patient and family. After consideration of risks, benefits and other options for treatment, the patient has consented to  Procedure(s): PERMANENT PACEMAKER GENERATOR CHANGE (N/A) as a surgical intervention .  The patient's history has been reviewed, patient examined, no change in status, stable for surgery.  I have reviewed the patient's chart and labs.  Questions were answered to the patient's satisfaction.     [name]

## 2014-08-14 NOTE — Progress Notes (Signed)
Pt notified of positive PCR. Instructions verbal and written given to pt along with mupiricin.

## 2014-08-14 NOTE — Progress Notes (Signed)
WBC of 12.5 from previous lab results reported to Dr. Ladona Ridgel. No new orders received.

## 2014-08-19 DIAGNOSIS — N138 Other obstructive and reflux uropathy: Secondary | ICD-10-CM | POA: Diagnosis not present

## 2014-08-19 DIAGNOSIS — N139 Obstructive and reflux uropathy, unspecified: Secondary | ICD-10-CM | POA: Diagnosis not present

## 2014-08-19 DIAGNOSIS — N3000 Acute cystitis without hematuria: Secondary | ICD-10-CM | POA: Diagnosis not present

## 2014-08-19 DIAGNOSIS — N401 Enlarged prostate with lower urinary tract symptoms: Secondary | ICD-10-CM | POA: Diagnosis not present

## 2014-08-20 ENCOUNTER — Other Ambulatory Visit: Payer: Self-pay | Admitting: Internal Medicine

## 2014-08-22 ENCOUNTER — Telehealth: Payer: Self-pay | Admitting: Internal Medicine

## 2014-08-22 ENCOUNTER — Ambulatory Visit (INDEPENDENT_AMBULATORY_CARE_PROVIDER_SITE_OTHER): Payer: Medicare Other | Admitting: *Deleted

## 2014-08-22 DIAGNOSIS — I4891 Unspecified atrial fibrillation: Secondary | ICD-10-CM

## 2014-08-22 DIAGNOSIS — I482 Chronic atrial fibrillation, unspecified: Secondary | ICD-10-CM

## 2014-08-22 LAB — MDC_IDC_ENUM_SESS_TYPE_INCLINIC
Battery Remaining Longevity: 122 mo
Battery Voltage: 2.79 V
Brady Statistic AP VP Percent: 54 %
Brady Statistic AS VP Percent: 1 %
Brady Statistic AS VS Percent: 42 %
Lead Channel Pacing Threshold Pulse Width: 0.4 ms
Lead Channel Setting Pacing Amplitude: 2 V
Lead Channel Setting Pacing Pulse Width: 0.4 ms
Lead Channel Setting Sensing Sensitivity: 4 mV
MDC IDC MSMT BATTERY IMPEDANCE: 100 Ohm
MDC IDC MSMT LEADCHNL RA IMPEDANCE VALUE: 544 Ohm
MDC IDC MSMT LEADCHNL RA SENSING INTR AMPL: 1 mV
MDC IDC MSMT LEADCHNL RV IMPEDANCE VALUE: 470 Ohm
MDC IDC MSMT LEADCHNL RV PACING THRESHOLD AMPLITUDE: 0.75 V
MDC IDC SESS DTM: 20150903113453
MDC IDC SET LEADCHNL RA PACING AMPLITUDE: 1.5 V
MDC IDC STAT BRADY AP VS PERCENT: 3 %

## 2014-08-22 NOTE — Progress Notes (Signed)
Wound check appointment. Steri-strips removed. Wound without redness or edema. Incision edges approximated, wound well healed. Normal device function. Thresholds, sensing, and impedances consistent with implant measurements. Device programmed at 3.5V/auto capture programmed on for extra safety margin until 3 month visit. Histogram distribution appropriate for patient and level of activity.  A-fib, + eliquis.   No high ventricular rates noted. Patient educated about wound care, arm mobility, lifting restrictions. ROV in 3 months with implanting physician.

## 2014-08-22 NOTE — Telephone Encounter (Signed)
Rec'd from [facility] forward 5 pages to Dr. Yetta Barre

## 2014-08-28 DIAGNOSIS — M6250 Muscle wasting and atrophy, not elsewhere classified, unspecified site: Secondary | ICD-10-CM | POA: Diagnosis not present

## 2014-08-28 DIAGNOSIS — M6281 Muscle weakness (generalized): Secondary | ICD-10-CM | POA: Diagnosis not present

## 2014-08-29 ENCOUNTER — Encounter: Payer: Self-pay | Admitting: Internal Medicine

## 2014-09-05 DIAGNOSIS — R262 Difficulty in walking, not elsewhere classified: Secondary | ICD-10-CM | POA: Diagnosis not present

## 2014-09-05 DIAGNOSIS — M25569 Pain in unspecified knee: Secondary | ICD-10-CM | POA: Diagnosis not present

## 2014-09-05 DIAGNOSIS — M1710 Unilateral primary osteoarthritis, unspecified knee: Secondary | ICD-10-CM | POA: Diagnosis not present

## 2014-09-05 DIAGNOSIS — IMO0002 Reserved for concepts with insufficient information to code with codable children: Secondary | ICD-10-CM | POA: Diagnosis not present

## 2014-09-06 ENCOUNTER — Encounter: Payer: Self-pay | Admitting: Internal Medicine

## 2014-09-10 DIAGNOSIS — IMO0002 Reserved for concepts with insufficient information to code with codable children: Secondary | ICD-10-CM | POA: Diagnosis not present

## 2014-09-10 DIAGNOSIS — M25569 Pain in unspecified knee: Secondary | ICD-10-CM | POA: Diagnosis not present

## 2014-09-10 DIAGNOSIS — R262 Difficulty in walking, not elsewhere classified: Secondary | ICD-10-CM | POA: Diagnosis not present

## 2014-09-10 DIAGNOSIS — M1710 Unilateral primary osteoarthritis, unspecified knee: Secondary | ICD-10-CM | POA: Diagnosis not present

## 2014-09-11 DIAGNOSIS — M19049 Primary osteoarthritis, unspecified hand: Secondary | ICD-10-CM | POA: Diagnosis not present

## 2014-09-11 DIAGNOSIS — M5137 Other intervertebral disc degeneration, lumbosacral region: Secondary | ICD-10-CM | POA: Diagnosis not present

## 2014-09-11 DIAGNOSIS — M810 Age-related osteoporosis without current pathological fracture: Secondary | ICD-10-CM | POA: Diagnosis not present

## 2014-09-11 DIAGNOSIS — M25569 Pain in unspecified knee: Secondary | ICD-10-CM | POA: Diagnosis not present

## 2014-09-12 ENCOUNTER — Telehealth: Payer: Self-pay | Admitting: Internal Medicine

## 2014-09-12 DIAGNOSIS — IMO0002 Reserved for concepts with insufficient information to code with codable children: Secondary | ICD-10-CM | POA: Diagnosis not present

## 2014-09-12 DIAGNOSIS — M25569 Pain in unspecified knee: Secondary | ICD-10-CM | POA: Diagnosis not present

## 2014-09-12 DIAGNOSIS — R262 Difficulty in walking, not elsewhere classified: Secondary | ICD-10-CM | POA: Diagnosis not present

## 2014-09-12 DIAGNOSIS — M1710 Unilateral primary osteoarthritis, unspecified knee: Secondary | ICD-10-CM | POA: Diagnosis not present

## 2014-09-12 DIAGNOSIS — Z23 Encounter for immunization: Secondary | ICD-10-CM | POA: Diagnosis not present

## 2014-09-12 NOTE — Telephone Encounter (Signed)
Pt called stated he is not sure if he need to get another pneumonia injection. pt that he had one 2013, but pt still want the assistant to verify. Please advise.

## 2014-09-13 NOTE — Telephone Encounter (Signed)
He needs prevnar

## 2014-09-13 NOTE — Telephone Encounter (Signed)
Pt notified and scheduled.

## 2014-09-17 DIAGNOSIS — IMO0002 Reserved for concepts with insufficient information to code with codable children: Secondary | ICD-10-CM | POA: Diagnosis not present

## 2014-09-17 DIAGNOSIS — M1710 Unilateral primary osteoarthritis, unspecified knee: Secondary | ICD-10-CM | POA: Diagnosis not present

## 2014-09-17 DIAGNOSIS — R5381 Other malaise: Secondary | ICD-10-CM | POA: Diagnosis not present

## 2014-09-17 DIAGNOSIS — M25569 Pain in unspecified knee: Secondary | ICD-10-CM | POA: Diagnosis not present

## 2014-09-17 DIAGNOSIS — R5383 Other fatigue: Secondary | ICD-10-CM | POA: Diagnosis not present

## 2014-09-17 DIAGNOSIS — R262 Difficulty in walking, not elsewhere classified: Secondary | ICD-10-CM | POA: Diagnosis not present

## 2014-09-18 ENCOUNTER — Ambulatory Visit (INDEPENDENT_AMBULATORY_CARE_PROVIDER_SITE_OTHER): Payer: Medicare Other | Admitting: Geriatric Medicine

## 2014-09-18 DIAGNOSIS — E538 Deficiency of other specified B group vitamins: Secondary | ICD-10-CM | POA: Diagnosis not present

## 2014-09-18 DIAGNOSIS — Z23 Encounter for immunization: Secondary | ICD-10-CM

## 2014-09-18 MED ORDER — CYANOCOBALAMIN 1000 MCG/ML IJ SOLN
1000.0000 ug | Freq: Once | INTRAMUSCULAR | Status: AC
  Administered 2014-09-18: 1000 ug via INTRAMUSCULAR

## 2014-09-19 DIAGNOSIS — R262 Difficulty in walking, not elsewhere classified: Secondary | ICD-10-CM | POA: Diagnosis not present

## 2014-09-19 DIAGNOSIS — M6281 Muscle weakness (generalized): Secondary | ICD-10-CM | POA: Diagnosis not present

## 2014-09-19 DIAGNOSIS — H905 Unspecified sensorineural hearing loss: Secondary | ICD-10-CM | POA: Diagnosis not present

## 2014-09-19 DIAGNOSIS — M1711 Unilateral primary osteoarthritis, right knee: Secondary | ICD-10-CM | POA: Diagnosis not present

## 2014-09-23 DIAGNOSIS — H905 Unspecified sensorineural hearing loss: Secondary | ICD-10-CM | POA: Diagnosis not present

## 2014-09-23 DIAGNOSIS — M6281 Muscle weakness (generalized): Secondary | ICD-10-CM | POA: Diagnosis not present

## 2014-09-23 DIAGNOSIS — R262 Difficulty in walking, not elsewhere classified: Secondary | ICD-10-CM | POA: Diagnosis not present

## 2014-09-23 DIAGNOSIS — M1711 Unilateral primary osteoarthritis, right knee: Secondary | ICD-10-CM | POA: Diagnosis not present

## 2014-09-25 DIAGNOSIS — R262 Difficulty in walking, not elsewhere classified: Secondary | ICD-10-CM | POA: Diagnosis not present

## 2014-09-25 DIAGNOSIS — H905 Unspecified sensorineural hearing loss: Secondary | ICD-10-CM | POA: Diagnosis not present

## 2014-09-25 DIAGNOSIS — M1711 Unilateral primary osteoarthritis, right knee: Secondary | ICD-10-CM | POA: Diagnosis not present

## 2014-09-25 DIAGNOSIS — M6281 Muscle weakness (generalized): Secondary | ICD-10-CM | POA: Diagnosis not present

## 2014-09-30 DIAGNOSIS — M6250 Muscle wasting and atrophy, not elsewhere classified, unspecified site: Secondary | ICD-10-CM | POA: Diagnosis not present

## 2014-10-01 DIAGNOSIS — M6281 Muscle weakness (generalized): Secondary | ICD-10-CM | POA: Diagnosis not present

## 2014-10-01 DIAGNOSIS — M1711 Unilateral primary osteoarthritis, right knee: Secondary | ICD-10-CM | POA: Diagnosis not present

## 2014-10-01 DIAGNOSIS — H905 Unspecified sensorineural hearing loss: Secondary | ICD-10-CM | POA: Diagnosis not present

## 2014-10-01 DIAGNOSIS — R262 Difficulty in walking, not elsewhere classified: Secondary | ICD-10-CM | POA: Diagnosis not present

## 2014-10-03 DIAGNOSIS — M1711 Unilateral primary osteoarthritis, right knee: Secondary | ICD-10-CM | POA: Diagnosis not present

## 2014-10-03 DIAGNOSIS — M75111 Incomplete rotator cuff tear or rupture of right shoulder, not specified as traumatic: Secondary | ICD-10-CM | POA: Diagnosis not present

## 2014-10-03 DIAGNOSIS — R262 Difficulty in walking, not elsewhere classified: Secondary | ICD-10-CM | POA: Diagnosis not present

## 2014-10-03 DIAGNOSIS — M6281 Muscle weakness (generalized): Secondary | ICD-10-CM | POA: Diagnosis not present

## 2014-10-10 ENCOUNTER — Ambulatory Visit (INDEPENDENT_AMBULATORY_CARE_PROVIDER_SITE_OTHER): Payer: Medicare Other | Admitting: *Deleted

## 2014-10-10 DIAGNOSIS — E538 Deficiency of other specified B group vitamins: Secondary | ICD-10-CM | POA: Diagnosis not present

## 2014-10-10 DIAGNOSIS — R262 Difficulty in walking, not elsewhere classified: Secondary | ICD-10-CM | POA: Diagnosis not present

## 2014-10-10 DIAGNOSIS — H905 Unspecified sensorineural hearing loss: Secondary | ICD-10-CM | POA: Diagnosis not present

## 2014-10-10 DIAGNOSIS — M1711 Unilateral primary osteoarthritis, right knee: Secondary | ICD-10-CM | POA: Diagnosis not present

## 2014-10-10 DIAGNOSIS — M6281 Muscle weakness (generalized): Secondary | ICD-10-CM | POA: Diagnosis not present

## 2014-10-10 MED ORDER — CYANOCOBALAMIN 1000 MCG/ML IJ SOLN
1000.0000 ug | Freq: Once | INTRAMUSCULAR | Status: AC
  Administered 2014-10-10: 1000 ug via INTRAMUSCULAR

## 2014-10-21 DIAGNOSIS — H903 Sensorineural hearing loss, bilateral: Secondary | ICD-10-CM | POA: Diagnosis not present

## 2014-11-07 ENCOUNTER — Ambulatory Visit (INDEPENDENT_AMBULATORY_CARE_PROVIDER_SITE_OTHER): Payer: Medicare Other | Admitting: *Deleted

## 2014-11-07 DIAGNOSIS — D518 Other vitamin B12 deficiency anemias: Secondary | ICD-10-CM

## 2014-11-07 MED ORDER — CYANOCOBALAMIN 1000 MCG/ML IJ SOLN
1000.0000 ug | Freq: Once | INTRAMUSCULAR | Status: AC
  Administered 2014-11-07: 1000 ug via INTRAMUSCULAR

## 2014-11-12 ENCOUNTER — Encounter: Payer: Self-pay | Admitting: Internal Medicine

## 2014-11-12 ENCOUNTER — Ambulatory Visit (INDEPENDENT_AMBULATORY_CARE_PROVIDER_SITE_OTHER): Payer: Medicare Other | Admitting: Internal Medicine

## 2014-11-12 VITALS — BP 104/62 | HR 61 | Ht 70.0 in | Wt 166.0 lb

## 2014-11-12 DIAGNOSIS — Z950 Presence of cardiac pacemaker: Secondary | ICD-10-CM | POA: Diagnosis not present

## 2014-11-12 DIAGNOSIS — I482 Chronic atrial fibrillation, unspecified: Secondary | ICD-10-CM

## 2014-11-12 DIAGNOSIS — I10 Essential (primary) hypertension: Secondary | ICD-10-CM | POA: Diagnosis not present

## 2014-11-12 NOTE — Assessment & Plan Note (Signed)
His Medtronic DDD PM is working normally. As he is chronically in atrial fib, he will be reprogrammed to VVIR

## 2014-11-12 NOTE — Assessment & Plan Note (Signed)
His blood pressure is well controlled. No change in meds.  

## 2014-11-12 NOTE — Patient Instructions (Signed)
Your physician wants you to follow-up in: 12 months with Dr. Court Joyaylor You will receive a reminder letter in the mail two months in advance. If you don't receive a letter, please call our office to schedule the follow-up appointment.  Remote monitoring is used to monitor your Pacemaker of ICD from home. This monitoring reduces the number of office visits required to check your device to one time per year. It allows us to keep an eye on the functioning of your device to ensure it is working properly. You are scheduled for a device check from home on 02/12/15. You may send your transmission at any time that day. If you have a wireless device, the transmission will be sent automatically. After your physician reviews your transmission, you will receive a postcard with your next transmission date.  '

## 2014-11-12 NOTE — Assessment & Plan Note (Signed)
His ventricular rate is well controlled. Will follow. He will continue systemic anticoagulation.

## 2014-11-12 NOTE — Progress Notes (Signed)
HPI [name] returns today for followup. He is a very pleasant 80 year old man with a history of chronic atrial fibrillation and complete heart block status post permanent pacemaker insertion. In the interim, he has been stable. He has undergone generator change out on his pacemaker. He denies syncope, chest pain, shortness of breath, or peripheral edema. He admits to some daytime somnolence if he takes his sleeping medication Allergies  Allergen Reactions  . Asparaginase Derivatives Other (See Comments)    Makes jittery & causes vision problems, headaches  . Doxycycline Hyclate Diarrhea and Nausea And Vomiting  . Erythromycin Diarrhea and Nausea And Vomiting  . Tetracycline Diarrhea and Nausea And Vomiting  . Ambien [Zolpidem Tartrate] Other (See Comments)    unknown  . Caffeine Other (See Comments)    Interrupts sleep  . Clindamycin Other (See Comments)    REACTION: upset stomach  . Lactose Intolerance (Gi) Other (See Comments)    unknown  . Lyrica [Pregabalin] Other (See Comments)    unknown  . Metronidazole Itching and Rash  . Nitrostat [Nitroglycerin] Diarrhea  . Penicillins Hives  . Sulfonamide Derivatives Hives     Current Outpatient Prescriptions  Medication Sig Dispense Refill  . apixaban (ELIQUIS) 5 MG TABS tablet Take 1 tablet (5 mg total) by mouth 2 (two) times daily. 60 tablet 6  . Calcium-Magnesium-Vitamin D (CITRACAL CALCIUM+D) 600-40-500 MG-MG-UNIT TB24 Take 2 tablets by mouth daily.     . cyanocobalamin (,VITAMIN B-12,) 1000 MCG/ML injection Inject 1,000 mcg into the muscle every 21 ( twenty-one) days.    . ferrous sulfate 325 (65 FE) MG tablet Take 1 tablet (325 mg total) by mouth 2 (two) times daily with a meal. (Patient taking differently: Take 325 mg by mouth daily with breakfast. ) 180 tablet 1  . fexofenadine (ALLEGRA) 180 MG tablet Take 180 mg by mouth at bedtime as needed (for allergies).     . finasteride (PROSCAR) 5 MG tablet Take 1 tablet (5 mg total) by  mouth at bedtime. 90 tablet 3  . metoprolol tartrate (LOPRESSOR) 25 MG tablet Take 1 tablet by mouth  twice a day 60 tablet 6  . Multiple Vitamins-Minerals (OCUVITE-LUTEIN PO) Take 1 tablet by mouth daily.     . nitrofurantoin, macrocrystal-monohydrate, (MACROBID) 100 MG capsule Take 100 mg by mouth 2 (two) times daily.    . nitroGLYCERIN (NITROSTAT) 0.4 MG SL tablet Place 0.4 mg under the tongue every 5 (five) minutes x 3 doses as needed for chest pain.    [name] Kitchen. omeprazole (PRILOSEC) 40 MG capsule Take 40 mg by mouth daily.      . polyethylene glycol (MIRALAX / GLYCOLAX) packet Take 17 g by mouth daily as needed for moderate constipation.     [name] Kitchen. PROLIA 60 MG/ML SOLN injection Inject 60 mg into the skin every 6 (six) months.     . rosuvastatin (CRESTOR) 10 MG tablet Take 10 mg by mouth daily.    . Suvorexant (BELSOMRA) 10 MG TABS Take 10 mg by mouth at bedtime.    [name] Kitchen. terazosin (HYTRIN) 5 MG capsule Take 1 capsule (5 mg total) by mouth daily. 90 capsule 3   No current facility-administered medications for this visit.     Past Medical History  Diagnosis Date  . Atrial fibrillation     a. Was on Coumadin until GIB 01/2014.  [name] Kitchen. Hyperlipidemia   . Bronchiectasis without acute exacerbation   . Disorders of diaphragm   . Plantar fascial fibromatosis   . Epistaxis   .  Unspecified sinusitis (chronic)   . Allergic rhinitis, cause unspecified   . Spinal stenosis, unspecified region other than cervical   . Hypertension   . Coronary artery disease     a. s/p CABG 1993. b. s/p PCI of SVG to OM '03.  . Tachy-brady syndrome     s/p Medtronic PPM '07  . Myocardial infarction   . GERD (gastroesophageal reflux disease)   . Arthritis   . H/O: GI bleed     a. 01/2014 - presumed diverticular. Received PRBC, Vit K. Coumadin put on hold.  . Diverticulosis   . LV dysfunction     a. Echo 08/2013: EF 45-50%.    ROS:   All systems reviewed and negative except as noted in the HPI.   Past Surgical History   Procedure Laterality Date  . Coronary artery bypass graft      LIMA to LAD, SVG to OM, SVG to left circumflex, SVG to PD/PLSA  . Pacemaker insertion      2007, Medtronic dual-chamber  . Kidney cyst removal    . Doppler echocardiography  2011  . Cholecystectomy    . Vertebroplasty    . Cataract extraction w/ intraocular lens  implant, bilateral    . Hip arthroplasty Right 12/10/2013    Procedure: Right Hip Cemented Hemiarthroplasty;  Surgeon: Eldred MangesMark C Yates, MD;  [facility];  Service: Orthopedics;  Laterality: Right;  Right Hip Cemented Hemiarthroplasty  . Colonoscopy       Family History  Problem Relation Age of Onset  . Colon cancer Neg Hx   . Heart disease Neg Hx   . Stroke Mother   . Throat cancer Neg Hx   . Pancreatic cancer Neg Hx   . Diabetes Son   . Kidney disease Neg Hx   . Liver disease Neg Hx      History   Social History  . Marital Status: Married    Spouse Name: Dwayne [name]    Number of Children: 4  . Years of Education: N/A   Occupational History  .     Social History Main Topics  . Smoking status: Former Games developermoker  . Smokeless tobacco: Never Used     Comment: x 6 months in high school  . Alcohol Use: No     Comment: none  . Drug Use: No  . Sexual Activity: No   Other Topics Concern  . Not on file   Social History Narrative   Married.  Ambulates with a walker.     BP 104/62 mmHg  Pulse 61  Ht 5\' 10"  (1.778 m)  Wt 166 lb (75.297 kg)  BMI 23.82 kg/m2  Physical Exam:  frail appearing 80 year old man,NAD HEENT: Unremarkable Neck:  7 cm JVD, no thyromegally Back:  No CVA tenderness Lungs:  Clear with no wheezes, rales, or rhonchi. No increased work of breathing. Well healed PPM incision. HEART:  Regular rate rhythm, no murmurs, no rubs, no clicks Abd:  soft, positive bowel sounds, no organomegally, no rebound, no guarding Ext:  2 plus pulses, no edema, no cyanosis, no clubbing Skin:  No rashes no nodules Neuro:  CN II through XII  intact, motor grossly intact   DEVICE  Normal device function.  See PaceArt for details.   Assess/Plan:

## 2014-11-13 ENCOUNTER — Telehealth: Payer: Self-pay | Admitting: *Deleted

## 2014-11-13 LAB — MDC_IDC_ENUM_SESS_TYPE_INCLINIC
Battery Impedance: 100 Ohm
Date Time Interrogation Session: 20151124155339
Lead Channel Impedance Value: 551 Ohm
Lead Channel Sensing Intrinsic Amplitude: 0.7 mV
Lead Channel Setting Pacing Amplitude: 2 V
Lead Channel Setting Pacing Pulse Width: 0.4 ms
Lead Channel Setting Sensing Sensitivity: 4 mV
MDC IDC MSMT BATTERY REMAINING LONGEVITY: 120 mo
MDC IDC MSMT BATTERY VOLTAGE: 2.8 V
MDC IDC MSMT LEADCHNL RV IMPEDANCE VALUE: 476 Ohm
MDC IDC MSMT LEADCHNL RV PACING THRESHOLD AMPLITUDE: 0.75 V
MDC IDC MSMT LEADCHNL RV PACING THRESHOLD PULSEWIDTH: 0.4 ms
MDC IDC STAT BRADY RV PERCENT PACED: 98 %

## 2014-11-13 NOTE — Telephone Encounter (Signed)
At device changeout, pt received new My Carelink monitor. They also received a return kit for previous Carelink 2490 model. Pt returned his new My Carelink instead of his 2490 version. Pt familiar w/ 2490 model. I spoke w/ tech svcs. Ordering a new 2490 w/ WireX can be achieved w/out tech svcs.  I called pt back to update them. LMOVM w/ my direct #.

## 2014-11-18 NOTE — Telephone Encounter (Signed)
Updated pt about new 2490 model that I ordered last Wed. Pt understands his old monitor can be discarded. Pt understands his new model will look like his old model and operate the same when sending a transmission. Pt understands his new box will arrive w/ a WireX; he will not use his analog phone line anymore.

## 2014-11-25 DIAGNOSIS — N401 Enlarged prostate with lower urinary tract symptoms: Secondary | ICD-10-CM | POA: Diagnosis not present

## 2014-11-25 DIAGNOSIS — N3941 Urge incontinence: Secondary | ICD-10-CM | POA: Diagnosis not present

## 2014-11-25 DIAGNOSIS — N3000 Acute cystitis without hematuria: Secondary | ICD-10-CM | POA: Diagnosis not present

## 2014-11-28 ENCOUNTER — Ambulatory Visit (INDEPENDENT_AMBULATORY_CARE_PROVIDER_SITE_OTHER): Payer: Medicare Other | Admitting: *Deleted

## 2014-11-28 ENCOUNTER — Encounter (HOSPITAL_COMMUNITY): Payer: Self-pay | Admitting: Internal Medicine

## 2014-11-28 ENCOUNTER — Other Ambulatory Visit: Payer: Self-pay | Admitting: Internal Medicine

## 2014-11-28 DIAGNOSIS — D518 Other vitamin B12 deficiency anemias: Secondary | ICD-10-CM

## 2014-11-28 MED ORDER — CYANOCOBALAMIN 1000 MCG/ML IJ SOLN
1000.0000 ug | Freq: Once | INTRAMUSCULAR | Status: AC
  Administered 2014-11-28: 1000 ug via INTRAMUSCULAR

## 2014-12-02 DIAGNOSIS — M810 Age-related osteoporosis without current pathological fracture: Secondary | ICD-10-CM | POA: Diagnosis not present

## 2014-12-02 LAB — HM DEXA SCAN: HM Dexa Scan: -2.8

## 2014-12-09 ENCOUNTER — Other Ambulatory Visit: Payer: Self-pay | Admitting: *Deleted

## 2014-12-09 MED ORDER — APIXABAN 5 MG PO TABS: 5.0000 mg | ORAL_TABLET | Freq: Two times a day (BID) | ORAL | Status: DC

## 2014-12-14 ENCOUNTER — Other Ambulatory Visit: Payer: Self-pay | Admitting: Internal Medicine

## 2014-12-14 ENCOUNTER — Other Ambulatory Visit: Payer: Self-pay | Admitting: Cardiology

## 2014-12-30 DIAGNOSIS — N4889 Other specified disorders of penis: Secondary | ICD-10-CM | POA: Diagnosis not present

## 2014-12-30 DIAGNOSIS — N451 Epididymitis: Secondary | ICD-10-CM | POA: Diagnosis not present

## 2014-12-30 DIAGNOSIS — N508 Other specified disorders of male genital organs: Secondary | ICD-10-CM | POA: Diagnosis not present

## 2014-12-31 ENCOUNTER — Telehealth: Payer: Self-pay | Admitting: Internal Medicine

## 2014-12-31 MED ORDER — SUVOREXANT 10 MG PO TABS: 10.0000 mg | ORAL_TABLET | Freq: Every day | ORAL | Status: DC

## 2014-12-31 NOTE — Telephone Encounter (Signed)
Pt wife called in said the pt needs refill on his   Suvorexant (BELSOMRA) 10 MG TABS [[id number]][[id number]]     Send to pharmacy on file

## 2014-12-31 NOTE — Telephone Encounter (Signed)
Faxed script back to [facility]...lmb 

## 2014-12-31 NOTE — Telephone Encounter (Signed)
done

## 2015-01-01 ENCOUNTER — Other Ambulatory Visit: Payer: Self-pay | Admitting: Internal Medicine

## 2015-01-01 DIAGNOSIS — M810 Age-related osteoporosis without current pathological fracture: Secondary | ICD-10-CM

## 2015-01-02 ENCOUNTER — Other Ambulatory Visit: Payer: Self-pay | Admitting: *Deleted

## 2015-01-02 MED ORDER — NITROGLYCERIN 0.4 MG SL SUBL: 0.4000 mg | SUBLINGUAL_TABLET | SUBLINGUAL | Status: DC | PRN

## 2015-01-21 DIAGNOSIS — N508 Other specified disorders of male genital organs: Secondary | ICD-10-CM | POA: Diagnosis not present

## 2015-01-21 DIAGNOSIS — N390 Urinary tract infection, site not specified: Secondary | ICD-10-CM | POA: Diagnosis not present

## 2015-01-23 ENCOUNTER — Telehealth: Payer: Self-pay | Admitting: Cardiology

## 2015-01-23 NOTE — Telephone Encounter (Signed)
Pt called in stating that he has not received his medtronics machine yet for his pace maker. Please f/u  Thanks

## 2015-01-23 NOTE — Telephone Encounter (Signed)
LMTCB//sss 

## 2015-01-25 NOTE — Progress Notes (Signed)
HPI: FU CAD s/p CABG, atrial fibrillation, HTN. Per notes, he had CABG 1993 with LIMA- LAD, SVG to OM, SVG to left circumflex, SVG to PD/PLSA. He had PCI of the saphenous vein graft to his obtuse marginal in 2003. Abdominal CT in June 2011 showed no abdominal aortic aneurysm. He was hospitalized February 2015 for GIB with Hgb down to 7.6 (was 11-12 in December). He was treated with Vit K and PRBCs. Coumadin was held. Last echocardiogram April 2015 showed an ejection fraction of 50-55%, mild mitral regurgitation and trace aortic insufficiency. Apixaban added at last ov. Since he was last seen, the patient denies any dyspnea on exertion, orthopnea, PND, pedal edema, palpitations, syncope or chest pain.   Current Outpatient Prescriptions  Medication Sig Dispense Refill  . Calcium-Magnesium-Vitamin D (CITRACAL CALCIUM+D) 600-40-500 MG-MG-UNIT TB24 Take 2 tablets by mouth daily.     . cyanocobalamin (,VITAMIN B-12,) 1000 MCG/ML injection Inject 1,000 mcg into the muscle every 21 ( twenty-one) days.    [name] Kitchen ELIQUIS 5 MG TABS tablet Take 1 tablet by mouth two  times daily 180 tablet 1  . ferrous sulfate 325 (65 FE) MG tablet Take 1 tablet (325 mg total) by mouth 2 (two) times daily with a meal. (Patient taking differently: Take 325 mg by mouth daily with breakfast. ) 180 tablet 1  . fexofenadine (ALLEGRA) 180 MG tablet Take 180 mg by mouth at bedtime as needed (for allergies).     . finasteride (PROSCAR) 5 MG tablet Take 1 tablet by mouth at  bedtime 90 tablet 3  . metoprolol tartrate (LOPRESSOR) 25 MG tablet Take 1 tablet by mouth  twice a day 60 tablet 6  . Multiple Vitamins-Minerals (OCUVITE-LUTEIN PO) Take 1 tablet by mouth daily.     . nitrofurantoin (MACRODANTIN) 100 MG capsule Take 100 mg by mouth 2 (two) times daily.  0  . nitrofurantoin, macrocrystal-monohydrate, (MACROBID) 100 MG capsule Take 100 mg by mouth 2 (two) times daily.    . nitroGLYCERIN (NITROSTAT) 0.4 MG SL tablet Place 1 tablet  (0.4 mg total) under the tongue every 5 (five) minutes x 3 doses as needed for chest pain. 25 tablet 1  . omeprazole (PRILOSEC) 40 MG capsule Take 40 mg by mouth daily.      . polyethylene glycol (MIRALAX / GLYCOLAX) packet Take 17 g by mouth daily as needed for moderate constipation.     [name] Kitchen PROLIA 60 MG/ML SOLN injection Inject 60 mg into the skin every 6 (six) months.     . rosuvastatin (CRESTOR) 10 MG tablet Take 10 mg by mouth daily.    . Suvorexant (BELSOMRA) 10 MG TABS Take 10 mg by mouth at bedtime. 30 tablet 5  . terazosin (HYTRIN) 5 MG capsule Take 1 capsule by mouth  daily 90 capsule 3  . triamcinolone cream (KENALOG) 0.1 % Apply 1 application topically as needed.  0   No current facility-administered medications for this visit.     Past Medical History  Diagnosis Date  . Atrial fibrillation     a. Was on Coumadin until GIB 01/2014.  [name] Kitchen Hyperlipidemia   . Bronchiectasis without acute exacerbation   . Disorders of diaphragm   . Plantar fascial fibromatosis   . Epistaxis   . Unspecified sinusitis (chronic)   . Allergic rhinitis, cause unspecified   . Spinal stenosis, unspecified region other than cervical   . Hypertension   . Coronary artery disease     a. s/p CABG 1993.  b. s/p PCI of SVG to OM '03.  . Tachy-brady syndrome     s/p Medtronic PPM '07  . Myocardial infarction   . GERD (gastroesophageal reflux disease)   . Arthritis   . H/O: GI bleed     a. 01/2014 - presumed diverticular. Received PRBC, Vit K. Coumadin put on hold.  . Diverticulosis   . LV dysfunction     a. Echo 08/2013: EF 45-50%.    Past Surgical History  Procedure Laterality Date  . Coronary artery bypass graft      LIMA to LAD, SVG to OM, SVG to left circumflex, SVG to PD/PLSA  . Pacemaker insertion      2007, Medtronic dual-chamber  . Kidney cyst removal    . Doppler echocardiography  2011  . Cholecystectomy    . Vertebroplasty    . Cataract extraction w/ intraocular lens  implant, bilateral      . Hip arthroplasty Right 12/10/2013    Procedure: Right Hip Cemented Hemiarthroplasty;  Surgeon: Eldred MangesMark C Yates, MD;  [facility];  Service: Orthopedics;  Laterality: Right;  Right Hip Cemented Hemiarthroplasty  . Colonoscopy    . Permanent pacemaker generator change N/A 08/14/2014    Procedure: PERMANENT PACEMAKER GENERATOR CHANGE;  Surgeon: Marinus MawGregg W Taylor, MD;  [facility];  Service: Cardiovascular;  Laterality: N/A;    History   Social History  . Marital Status: Married    Spouse Name: Ollen GrossMarjorie    Number of Children: 4  . Years of Education: N/A   Occupational History  .     Social History Main Topics  . Smoking status: Former Games developermoker  . Smokeless tobacco: Never Used     Comment: x 6 months in high school  . Alcohol Use: No     Comment: none  . Drug Use: No  . Sexual Activity: No   Other Topics Concern  . Not on file   Social History Narrative   Married.  Ambulates with a walker.    ROS: no fevers or chills, productive cough, hemoptysis, dysphasia, odynophagia, melena, hematochezia, dysuria, hematuria, rash, seizure activity, orthopnea, PND, pedal edema, claudication. Remaining systems are negative.  Physical Exam: Well-developed well-nourished in no acute distress.  Skin is warm and dry.  HEENT is normal.  Neck is supple.  Chest is clear to auscultation with normal expansion.  Cardiovascular exam is regular rate and rhythm. 2/6 systolic murmur left sternal border. Abdominal exam nontender or distended. No masses palpated. Extremities show no edema. neuro grossly intact  ECG ventricular paced rhythm

## 2015-01-28 ENCOUNTER — Encounter: Payer: Self-pay | Admitting: Cardiology

## 2015-01-28 ENCOUNTER — Ambulatory Visit (INDEPENDENT_AMBULATORY_CARE_PROVIDER_SITE_OTHER): Payer: Medicare Other | Admitting: Cardiology

## 2015-01-28 ENCOUNTER — Telehealth: Payer: Self-pay | Admitting: Internal Medicine

## 2015-01-28 VITALS — BP 102/68 | HR 70 | Ht 70.0 in | Wt 161.3 lb

## 2015-01-28 DIAGNOSIS — I10 Essential (primary) hypertension: Secondary | ICD-10-CM

## 2015-01-28 DIAGNOSIS — E785 Hyperlipidemia, unspecified: Secondary | ICD-10-CM | POA: Diagnosis not present

## 2015-01-28 DIAGNOSIS — I25700 Atherosclerosis of coronary artery bypass graft(s), unspecified, with unstable angina pectoris: Secondary | ICD-10-CM

## 2015-01-28 DIAGNOSIS — I482 Chronic atrial fibrillation, unspecified: Secondary | ICD-10-CM

## 2015-01-28 DIAGNOSIS — Z950 Presence of cardiac pacemaker: Secondary | ICD-10-CM

## 2015-01-28 DIAGNOSIS — I4891 Unspecified atrial fibrillation: Secondary | ICD-10-CM | POA: Diagnosis not present

## 2015-01-28 NOTE — Assessment & Plan Note (Signed)
Follow-up by electrophysiology.

## 2015-01-28 NOTE — Assessment & Plan Note (Signed)
Continue statin. Not on aspirin given need for anticoagulation. 

## 2015-01-28 NOTE — Assessment & Plan Note (Signed)
Continue beta blocker and apixaban; check Hgb and renal function.

## 2015-01-28 NOTE — Patient Instructions (Signed)
Your physician wants you to follow-up in: 6 MONTHS WITH DR CRENSHAW You will receive a reminder letter in the mail two months in advance. If you don't receive a letter, please call our office to schedule the follow-up appointment.   Your physician recommends that you HAVE LAB WORK TODAY 

## 2015-01-28 NOTE — Assessment & Plan Note (Signed)
Blood pressure controlled. Continue present medications. 

## 2015-01-28 NOTE — Telephone Encounter (Signed)
New problem ° ° °Pt returning your call. °

## 2015-01-28 NOTE — Assessment & Plan Note (Signed)
Continue statin. 

## 2015-01-29 LAB — CBC
HEMATOCRIT: 40.3 % (ref 39.0–52.0)
HEMOGLOBIN: 13.5 g/dL (ref 13.0–17.0)
MCH: 31.7 pg (ref 26.0–34.0)
MCHC: 33.5 g/dL (ref 30.0–36.0)
MCV: 94.6 fL (ref 78.0–100.0)
MPV: 10.9 fL (ref 8.6–12.4)
PLATELETS: 261 10*3/uL (ref 150–400)
RBC: 4.26 MIL/uL (ref 4.22–5.81)
RDW: 14.3 % (ref 11.5–15.5)
WBC: 9.9 10*3/uL (ref 4.0–10.5)

## 2015-01-29 LAB — BASIC METABOLIC PANEL WITH GFR
BUN: 17 mg/dL (ref 6–23)
CALCIUM: 9.2 mg/dL (ref 8.4–10.5)
CO2: 26 mEq/L (ref 19–32)
CREATININE: 0.82 mg/dL (ref 0.50–1.35)
Chloride: 109 mEq/L (ref 96–112)
GFR, EST NON AFRICAN AMERICAN: 84 mL/min
GFR, Est African American: >89 mL/min
Glucose, Bld: 85 mg/dL (ref 70–99)
Potassium: 4.1 mEq/L (ref 3.5–5.3)
Sodium: 142 mEq/L (ref 135–145)

## 2015-01-29 NOTE — Telephone Encounter (Signed)
Can this encounter be closed?

## 2015-01-29 NOTE — Telephone Encounter (Signed)
Informed pt that I will contact  [employer] regarding a new My[employer]link monitor.

## 2015-01-29 NOTE — Telephone Encounter (Signed)
See note dated 2/9 for details.

## 2015-02-07 ENCOUNTER — Ambulatory Visit (INDEPENDENT_AMBULATORY_CARE_PROVIDER_SITE_OTHER): Payer: Medicare Other | Admitting: Endocrinology

## 2015-02-07 ENCOUNTER — Encounter: Payer: Self-pay | Admitting: Endocrinology

## 2015-02-07 VITALS — BP 118/80 | HR 82 | Temp 97.7°F | Wt 160.0 lb

## 2015-02-07 DIAGNOSIS — K5900 Constipation, unspecified: Secondary | ICD-10-CM | POA: Diagnosis not present

## 2015-02-07 DIAGNOSIS — M810 Age-related osteoporosis without current pathological fracture: Secondary | ICD-10-CM

## 2015-02-07 DIAGNOSIS — I495 Sick sinus syndrome: Secondary | ICD-10-CM | POA: Diagnosis not present

## 2015-02-07 DIAGNOSIS — I4891 Unspecified atrial fibrillation: Secondary | ICD-10-CM

## 2015-02-07 DIAGNOSIS — N400 Enlarged prostate without lower urinary tract symptoms: Secondary | ICD-10-CM | POA: Diagnosis not present

## 2015-02-07 DIAGNOSIS — E785 Hyperlipidemia, unspecified: Secondary | ICD-10-CM | POA: Diagnosis not present

## 2015-02-07 DIAGNOSIS — K5909 Other constipation: Secondary | ICD-10-CM

## 2015-02-07 DIAGNOSIS — I25700 Atherosclerosis of coronary artery bypass graft(s), unspecified, with unstable angina pectoris: Secondary | ICD-10-CM | POA: Diagnosis not present

## 2015-02-07 DIAGNOSIS — Z79899 Other long term (current) drug therapy: Secondary | ICD-10-CM

## 2015-02-07 NOTE — Patient Instructions (Addendum)
blood tests are being requested for you today.  We'll let you know about the results. Based on the results, i can recommend treatment for your osteoporosis. it is critically important to prevent falling down (keep floor areas well-lit, dry, and free of loose objects.  If you have a cane, walker, or wheelchair, you should use it, even for short trips around the house.  Also, try not to rush).

## 2015-02-07 NOTE — Progress Notes (Signed)
Subjective:    Patient ID: Dwayne [name], male    DOB: 04-22-1932, 81 y.o.   MRN: [id number]  HPI Pt was noted to have osteoporosis in 2011, when he presented with sudden-onset severe pain at the back.  No assoc numbness of the feet.  He had kyphoplasty, with improvement in the pain.  he has never had any other bony fracture.  he has no history of any of the following: hypogonadism, cancer, renal dz, thyroid problems, prolonged bedrest, steriods, alcoholism, liver dz, vid-d deficiency, primary hyperparathyroidism, heparin, or anticonvulsants.  He smoked x approx 3 years, many years ago.  He takes vid-D and Ca++, but is uncertain how much.  He reports h/o urolithiasis many years ago.  He had repair of a fx hip in 2014.  According to chart, he took fosamax from 2010 (earliest e-chart entry, but could have been earlier) up to early 2014.  Then there are several scanned noted from rheumatology, indicating that he has been on prolia in 2014, but it not known how recently he received this.  Past Medical History  Diagnosis Date  . Atrial fibrillation     a. Was on Coumadin until GIB 01/2014.  [name] Kitchen. Hyperlipidemia   . Bronchiectasis without acute exacerbation   . Disorders of diaphragm   . Plantar fascial fibromatosis   . Epistaxis   . Unspecified sinusitis (chronic)   . Allergic rhinitis, cause unspecified   . Spinal stenosis, unspecified region other than cervical   . Hypertension   . Coronary artery disease     a. s/p CABG 1993. b. s/p PCI of SVG to OM '03.  . Tachy-brady syndrome     s/p Medtronic PPM '07  . Myocardial infarction   . GERD (gastroesophageal reflux disease)   . Arthritis   . H/O: GI bleed     a. 01/2014 - presumed diverticular. Received PRBC, Vit K. Coumadin put on hold.  . Diverticulosis   . LV dysfunction     a. Echo 08/2013: EF 45-50%.    Past Surgical History  Procedure Laterality Date  . Coronary artery bypass graft      LIMA to LAD, SVG to OM, SVG to left circumflex,  SVG to PD/PLSA  . Pacemaker insertion      2007, Medtronic dual-chamber  . Kidney cyst removal    . Doppler echocardiography  2011  . Cholecystectomy    . Vertebroplasty    . Cataract extraction w/ intraocular lens  implant, bilateral    . Hip arthroplasty Right 12/10/2013    Procedure: Right Hip Cemented Hemiarthroplasty;  Surgeon: Eldred MangesMark C Yates, MD;  [facility];  Service: Orthopedics;  Laterality: Right;  Right Hip Cemented Hemiarthroplasty  . Colonoscopy    . Permanent pacemaker generator change N/A 08/14/2014    Procedure: PERMANENT PACEMAKER GENERATOR CHANGE;  Surgeon: Marinus MawGregg W Taylor, MD;  [facility];  Service: Cardiovascular;  Laterality: N/A;    History   Social History  . Marital Status: Married    Spouse Name: Ollen GrossMarjorie  . Number of Children: 4  . Years of Education: N/A   Occupational History  .     Social History Main Topics  . Smoking status: Former Games developermoker  . Smokeless tobacco: Never Used     Comment: x 6 months in high school  . Alcohol Use: No     Comment: none  . Drug Use: No  . Sexual Activity: No   Other Topics Concern  . Not on  file   Social History Narrative   Married.  Ambulates with a walker.    Current Outpatient Prescriptions on File Prior to Visit  Medication Sig Dispense Refill  . Calcium-Magnesium-Vitamin D (CITRACAL CALCIUM+D) 600-40-500 MG-MG-UNIT TB24 Take 2 tablets by mouth daily.     . cyanocobalamin (,VITAMIN B-12,) 1000 MCG/ML injection Inject 1,000 mcg into the muscle every 21 ( twenty-one) days.    [name] Kitchen ELIQUIS 5 MG TABS tablet Take 1 tablet by mouth two  times daily 180 tablet 1  . ferrous sulfate 325 (65 FE) MG tablet Take 1 tablet (325 mg total) by mouth 2 (two) times daily with a meal. (Patient taking differently: Take 325 mg by mouth daily with breakfast. ) 180 tablet 1  . fexofenadine (ALLEGRA) 180 MG tablet Take 180 mg by mouth at bedtime as needed (for allergies).     . finasteride (PROSCAR) 5 MG tablet Take 1  tablet by mouth at  bedtime 90 tablet 3  . metoprolol tartrate (LOPRESSOR) 25 MG tablet Take 1 tablet by mouth  twice a day 60 tablet 6  . Multiple Vitamins-Minerals (OCUVITE-LUTEIN PO) Take 1 tablet by mouth daily.     . nitrofurantoin (MACRODANTIN) 100 MG capsule Take 100 mg by mouth 2 (two) times daily.  0  . nitrofurantoin, macrocrystal-monohydrate, (MACROBID) 100 MG capsule Take 100 mg by mouth 2 (two) times daily.    . nitroGLYCERIN (NITROSTAT) 0.4 MG SL tablet Place 1 tablet (0.4 mg total) under the tongue every 5 (five) minutes x 3 doses as needed for chest pain. 25 tablet 1  . omeprazole (PRILOSEC) 40 MG capsule Take 40 mg by mouth daily.      . polyethylene glycol (MIRALAX / GLYCOLAX) packet Take 17 g by mouth daily as needed for moderate constipation.     [name] Kitchen PROLIA 60 MG/ML SOLN injection Inject 60 mg into the skin every 6 (six) months.     . rosuvastatin (CRESTOR) 10 MG tablet Take 10 mg by mouth daily.    . Suvorexant (BELSOMRA) 10 MG TABS Take 10 mg by mouth at bedtime. 30 tablet 5  . terazosin (HYTRIN) 5 MG capsule Take 1 capsule by mouth  daily 90 capsule 3  . triamcinolone cream (KENALOG) 0.1 % Apply 1 application topically as needed.  0   No current facility-administered medications on file prior to visit.    Allergies  Allergen Reactions  . Asparaginase Derivatives Other (See Comments)    Makes jittery & causes vision problems, headaches  . Doxycycline Hyclate Diarrhea and Nausea And Vomiting  . Erythromycin Diarrhea and Nausea And Vomiting  . Tetracycline Diarrhea and Nausea And Vomiting  . Ambien [Zolpidem Tartrate] Other (See Comments)    unknown  . Caffeine Other (See Comments)    Interrupts sleep  . Clindamycin Other (See Comments)    REACTION: upset stomach  . Lactose Intolerance (Gi) Other (See Comments)    unknown  . Lyrica [Pregabalin] Other (See Comments)    unknown  . Metronidazole Itching and Rash  . Nitrostat [Nitroglycerin] Diarrhea  . Penicillins  Hives  . Sulfonamide Derivatives Hives    Family History  Problem Relation Age of Onset  . Colon cancer Neg Hx   . Heart disease Neg Hx   . Stroke Mother   . Throat cancer Neg Hx   . Pancreatic cancer Neg Hx   . Diabetes [name]   . Kidney disease Neg Hx   . Liver disease Neg Hx   . Osteoporosis Neg  Hx     BP 118/80 mmHg  Pulse 82  Temp(Src) 97.7 F (36.5 C) (Oral)  Wt 160 lb (72.576 kg)  SpO2 95%   Review of Systems  Constitutional: Negative for unexpected weight change.  HENT: Positive for hearing loss.   Eyes: Negative for visual disturbance.  Respiratory: Negative for shortness of breath.   Cardiovascular: Negative for leg swelling.  Gastrointestinal: Negative for diarrhea.  Endocrine: Negative for cold intolerance.  Genitourinary: Negative for difficulty urinating.  Musculoskeletal:       Uses walker  Skin: Negative for rash.  Neurological: Negative for syncope.  Psychiatric/Behavioral: Negative for sleep disturbance.   denies falls, cramps, recent back pain, easy bruising, and rhinorrhea.  He has mild memory loss.    Objective:   Physical Exam Vital signs: see vs page Gen: elderly, frail, no distress HEAD: head: no deformity eyes: no periorbital swelling, no proptosis external nose and ears are normal mouth: no lesion seen NECK: supple, thyroid is not enlarged CHEST WALL: no deformity, except for kyphosis LUNGS: clear to auscultation BREASTS:  No gynecomastia CV: reg rate and rhythm, no murmur ABD: abdomen is soft, nontender.  no hepatosplenomegaly.  not distended.  no hernia MUSCULOSKELETAL: muscle bulk and strength are grossly normal.  no obvious joint swelling.  gait is steady with a wheeled walker EXTEMITIES: no deformity.  no ulcer on the feet.  feet are of normal color and temp.  no edema PULSES: dorsalis pedis intact bilat.  no carotid bruit NEURO:  cn 2-12 grossly intact.   readily moves all 4's.  sensation is intact to touch on the feet SKIN:   Normal texture and temperature.  No rash or suspicious lesion is visible.   NODES:  None palpable at the neck PSYCH: alert, well-oriented.  Does not appear anxious nor depressed.    Radiol: i reviewed DEXA results  Chart review: med hx says fosamax was d/c'ed in jan of 2014, but it does not say how long he was on it, and pt does not know.     Assessment & Plan:  Osteoporosis, severe. Frail elderly state: in this setting, i favor aggressive rx of the osteoporosis.     Patient is advised the following: Patient Instructions  blood tests are being requested for you today.  We'll let you know about the results. Based on the results, i can recommend treatment for your osteoporosis. it is critically important to prevent falling down (keep floor areas well-lit, dry, and free of loose objects.  If you have a cane, walker, or wheelchair, you should use it, even for short trips around the house.  Also, try not to rush).  addendum: if labs are unremarkable, i would favor continuing the prolia.

## 2015-02-08 LAB — PSA: PSA: 3.29 ng/mL (ref <=4.00)

## 2015-02-08 LAB — TESTOSTERONE: Testosterone: 471 ng/dL (ref 300–890)

## 2015-02-08 LAB — TSH: TSH: 1.134 u[IU]/mL (ref 0.350–4.500)

## 2015-02-08 LAB — VITAMIN D 25 HYDROXY (VIT D DEFICIENCY, FRACTURES): VIT D 25 HYDROXY: 32 ng/mL (ref 30–100)

## 2015-02-10 LAB — PTH, INTACT AND CALCIUM
Calcium: 9.9 mg/dL (ref 8.4–10.5)
PTH: 32 pg/mL (ref 14–64)

## 2015-02-11 LAB — PROTEIN ELECTROPHORESIS, SERUM
ALBUMIN ELP: 56.9 % (ref 55.8–66.1)
ALPHA-1-GLOBULIN: 4 % (ref 2.9–4.9)
Alpha-2-Globulin: 9.4 % (ref 7.1–11.8)
BETA 2: 5.5 % (ref 3.2–6.5)
BETA GLOBULIN: 5.3 % (ref 4.7–7.2)
GAMMA GLOBULIN: 18.9 % — AB (ref 11.1–18.8)
Total Protein, Serum Electrophoresis: 4.9 g/dL — ABNORMAL LOW (ref 6.0–8.3)

## 2015-02-18 ENCOUNTER — Ambulatory Visit: Payer: Self-pay | Admitting: Cardiology

## 2015-02-18 ENCOUNTER — Ambulatory Visit (INDEPENDENT_AMBULATORY_CARE_PROVIDER_SITE_OTHER): Payer: Medicare Other

## 2015-02-18 DIAGNOSIS — E538 Deficiency of other specified B group vitamins: Secondary | ICD-10-CM | POA: Diagnosis not present

## 2015-02-18 DIAGNOSIS — Z5181 Encounter for therapeutic drug level monitoring: Secondary | ICD-10-CM

## 2015-02-18 DIAGNOSIS — I4891 Unspecified atrial fibrillation: Secondary | ICD-10-CM

## 2015-02-18 MED ORDER — CYANOCOBALAMIN 1000 MCG/ML IJ SOLN
1000.0000 ug | Freq: Once | INTRAMUSCULAR | Status: AC
  Administered 2015-02-18: 1000 ug via INTRAMUSCULAR

## 2015-02-19 ENCOUNTER — Telehealth: Payer: Self-pay | Admitting: Internal Medicine

## 2015-02-19 NOTE — Telephone Encounter (Signed)
I have sent pt's info for Prolia insurance verification and will notify you once I have a response. Thank you. °

## 2015-02-19 NOTE — Telephone Encounter (Signed)
Pt is currently receiving prolia injections at Dr. Fatima Sangereveshwar's office. Prolia request from our office has been D/C.

## 2015-03-11 ENCOUNTER — Other Ambulatory Visit (INDEPENDENT_AMBULATORY_CARE_PROVIDER_SITE_OTHER): Payer: Medicare Other

## 2015-03-11 ENCOUNTER — Encounter: Payer: Self-pay | Admitting: Internal Medicine

## 2015-03-11 ENCOUNTER — Telehealth: Payer: Self-pay | Admitting: Internal Medicine

## 2015-03-11 ENCOUNTER — Ambulatory Visit (INDEPENDENT_AMBULATORY_CARE_PROVIDER_SITE_OTHER): Payer: Medicare Other | Admitting: Internal Medicine

## 2015-03-11 VITALS — BP 122/76 | HR 83 | Temp 97.8°F | Resp 16 | Ht 70.0 in | Wt 162.2 lb

## 2015-03-11 DIAGNOSIS — D518 Other vitamin B12 deficiency anemias: Secondary | ICD-10-CM | POA: Diagnosis not present

## 2015-03-11 DIAGNOSIS — I2510 Atherosclerotic heart disease of native coronary artery without angina pectoris: Secondary | ICD-10-CM

## 2015-03-11 DIAGNOSIS — Z0000 Encounter for general adult medical examination without abnormal findings: Secondary | ICD-10-CM

## 2015-03-11 DIAGNOSIS — E785 Hyperlipidemia, unspecified: Secondary | ICD-10-CM

## 2015-03-11 DIAGNOSIS — I2584 Coronary atherosclerosis due to calcified coronary lesion: Secondary | ICD-10-CM

## 2015-03-11 DIAGNOSIS — D509 Iron deficiency anemia, unspecified: Secondary | ICD-10-CM

## 2015-03-11 DIAGNOSIS — R739 Hyperglycemia, unspecified: Secondary | ICD-10-CM

## 2015-03-11 DIAGNOSIS — I10 Essential (primary) hypertension: Secondary | ICD-10-CM | POA: Diagnosis not present

## 2015-03-11 DIAGNOSIS — I25709 Atherosclerosis of coronary artery bypass graft(s), unspecified, with unspecified angina pectoris: Secondary | ICD-10-CM

## 2015-03-11 LAB — COMPREHENSIVE METABOLIC PANEL
ALT: 13 U/L (ref 0–53)
AST: 18 U/L (ref 0–37)
Albumin: 3.5 g/dL (ref 3.5–5.2)
Alkaline Phosphatase: 62 U/L (ref 39–117)
BILIRUBIN TOTAL: 0.5 mg/dL (ref 0.2–1.2)
BUN: 15 mg/dL (ref 6–23)
CALCIUM: 9.1 mg/dL (ref 8.4–10.5)
CHLORIDE: 107 meq/L (ref 96–112)
CO2: 28 meq/L (ref 19–32)
Creatinine, Ser: 0.92 mg/dL (ref 0.40–1.50)
GFR: 83.94 mL/min (ref >60.00)
GLUCOSE: 95 mg/dL (ref 70–99)
Potassium: 3.7 mEq/L (ref 3.5–5.1)
Sodium: 140 mEq/L (ref 135–145)
Total Protein: 6.6 g/dL (ref 6.0–8.3)

## 2015-03-11 LAB — CBC WITH DIFFERENTIAL/PLATELET
BASOS PCT: 0.6 % (ref 0.0–3.0)
Basophils Absolute: 0.1 10*3/uL (ref 0.0–0.1)
Eosinophils Absolute: 0.8 10*3/uL — ABNORMAL HIGH (ref 0.0–0.7)
Eosinophils Relative: 9.5 % — ABNORMAL HIGH (ref 0.0–5.0)
HCT: 40.3 % (ref 39.0–52.0)
Hemoglobin: 13.7 g/dL (ref 13.0–17.0)
LYMPHS PCT: 22.2 % (ref 12.0–46.0)
Lymphs Abs: 1.8 10*3/uL (ref 0.7–4.0)
MCHC: 34.1 g/dL (ref 30.0–36.0)
MCV: 93.9 fl (ref 78.0–100.0)
MONO ABS: 0.6 10*3/uL (ref 0.1–1.0)
Monocytes Relative: 7.7 % (ref 3.0–12.0)
NEUTROS ABS: 4.8 10*3/uL (ref 1.4–7.7)
NEUTROS PCT: 60 % (ref 43.0–77.0)
Platelets: 216 10*3/uL (ref 150.0–400.0)
RBC: 4.28 Mil/uL (ref 4.22–5.81)
RDW: 15 % (ref 11.5–15.5)
WBC: 8 10*3/uL (ref 4.0–10.5)

## 2015-03-11 LAB — LIPID PANEL
CHOL/HDL RATIO: 4
CHOLESTEROL: 173 mg/dL (ref 0–200)
HDL: 39.9 mg/dL (ref >39.00)
LDL Cholesterol: 113 mg/dL — ABNORMAL HIGH (ref 0–99)
NonHDL: 133.1
TRIGLYCERIDES: 103 mg/dL (ref 0.0–149.0)
VLDL: 20.6 mg/dL (ref 0.0–40.0)

## 2015-03-11 LAB — TSH: TSH: 1.3 u[IU]/mL (ref 0.35–4.50)

## 2015-03-11 LAB — HEMOGLOBIN A1C: HEMOGLOBIN A1C: 5.9 % (ref 4.6–6.5)

## 2015-03-11 MED ORDER — CYANOCOBALAMIN 1000 MCG/ML IJ SOLN
1000.0000 ug | Freq: Once | INTRAMUSCULAR | Status: AC
  Administered 2015-03-11: 1000 ug via INTRAMUSCULAR

## 2015-03-11 MED ORDER — ATORVASTATIN CALCIUM 40 MG PO TABS: 40.0000 mg | ORAL_TABLET | Freq: Every day | ORAL | Status: DC

## 2015-03-11 NOTE — Progress Notes (Signed)
Pre visit review using our clinic review tool, if applicable. No additional management support is needed unless otherwise documented below in the visit note. 

## 2015-03-11 NOTE — Telephone Encounter (Signed)
Pt stated Crestor is too expensive, can we send in something cheaper. Please help

## 2015-03-11 NOTE — Progress Notes (Signed)
Subjective:    Patient ID: Dwayne [name], male    DOB: 03-Dec-1932, 81 y.o.   MRN: [id number]  HPI Comments: He complains that crestor is too expensive  Hyperlipidemia This is a chronic problem. The current episode started more than 1 year ago. The problem is controlled. Recent lipid tests were reviewed and are variable. He has no history of chronic renal disease, diabetes, hypothyroidism, liver disease, obesity or nephrotic syndrome. There are no known factors aggravating his hyperlipidemia. Pertinent negatives include no chest pain, focal sensory loss, focal weakness, leg pain, myalgias or shortness of breath. Current antihyperlipidemic treatment includes statins. The current treatment provides moderate improvement of lipids. There are no compliance problems.       Review of Systems  Constitutional: Negative.  Negative for fever, chills, diaphoresis, appetite change and fatigue.  HENT: Negative.   Eyes: Negative.   Respiratory: Negative.  Negative for cough, choking, chest tightness, shortness of breath and stridor.   Cardiovascular: Negative.  Negative for chest pain, palpitations and leg swelling.  Gastrointestinal: Negative.  Negative for nausea, vomiting, abdominal pain, diarrhea, constipation and blood in stool.  Endocrine: Negative.   Genitourinary: Negative.   Musculoskeletal: Negative.  Negative for myalgias, back pain, arthralgias and neck pain.  Skin: Negative.   Allergic/Immunologic: Negative.   Neurological: Negative.  Negative for dizziness, tremors, focal weakness, syncope, light-headedness, numbness and headaches.  Hematological: Negative.  Negative for adenopathy. Does not bruise/bleed easily.  Psychiatric/Behavioral: Negative.        Objective:   Physical Exam  Constitutional: He is oriented to person, place, and time. He appears well-developed and well-nourished. No distress.  HENT:  Head: Normocephalic and atraumatic.  Mouth/Throat: Oropharynx is clear and  moist. No oropharyngeal exudate.  Eyes: Conjunctivae are normal. Right eye exhibits no discharge. Left eye exhibits no discharge. No scleral icterus.  Neck: Normal range of motion. Neck supple. No JVD present. No tracheal deviation present. No thyromegaly present.  Cardiovascular: Normal rate, regular rhythm, normal heart sounds and intact distal pulses.  Exam reveals no gallop and no friction rub.   No murmur heard. Pulmonary/Chest: Effort normal and breath sounds normal. No stridor. No respiratory distress. He has no wheezes. He has no rales. He exhibits no tenderness.  Abdominal: Soft. Bowel sounds are normal. He exhibits no distension and no mass. There is no tenderness. There is no rebound and no guarding.  Musculoskeletal: Normal range of motion. He exhibits no edema or tenderness.  Lymphadenopathy:    He has no cervical adenopathy.  Neurological: He is oriented to person, place, and time.  Skin: Skin is warm and dry. No rash noted. He is not diaphoretic. No erythema. No pallor.  Psychiatric: He has a normal mood and affect. His behavior is normal. Judgment and thought content normal.  Vitals reviewed.     Lab Results  Component Value Date   WBC 9.9 01/28/2015   HGB 13.5 01/28/2015   HCT 40.3 01/28/2015   PLT 261 01/28/2015   GLUCOSE 85 01/28/2015   CHOL 137 06/20/2013   TRIG 104.0 06/20/2013   HDL 48.50 06/20/2013   LDLCALC 68 06/20/2013   ALT 9 02/16/2014   AST 13 02/16/2014   NA 142 01/28/2015   K 4.1 01/28/2015   CL 109 01/28/2015   CREATININE 0.82 01/28/2015   BUN 17 01/28/2015   CO2 26 01/28/2015   TSH 1.134 02/07/2015   PSA 3.29 02/07/2015   INR 1.4* 02/21/2014   HGBA1C 5.7 02/21/2014  Assessment & Plan:

## 2015-03-11 NOTE — Patient Instructions (Signed)

## 2015-03-12 DIAGNOSIS — Z0000 Encounter for general adult medical examination without abnormal findings: Secondary | ICD-10-CM | POA: Insufficient documentation

## 2015-03-12 NOTE — Assessment & Plan Note (Addendum)

## 2015-03-12 NOTE — Assessment & Plan Note (Addendum)
His CBC is stable No s/s of this reported Will cont B12 injections

## 2015-03-12 NOTE — Assessment & Plan Note (Signed)
His BP is well controlled Lytes and renal function are stable 

## 2015-03-12 NOTE — Assessment & Plan Note (Signed)
He has achieved his LDL goal and is doing well on crestor but it is too expensive for him Will switch to atorvastatin

## 2015-03-13 DIAGNOSIS — M810 Age-related osteoporosis without current pathological fracture: Secondary | ICD-10-CM | POA: Diagnosis not present

## 2015-03-13 DIAGNOSIS — M5137 Other intervertebral disc degeneration, lumbosacral region: Secondary | ICD-10-CM | POA: Diagnosis not present

## 2015-03-13 DIAGNOSIS — N401 Enlarged prostate with lower urinary tract symptoms: Secondary | ICD-10-CM | POA: Diagnosis not present

## 2015-03-13 DIAGNOSIS — M19041 Primary osteoarthritis, right hand: Secondary | ICD-10-CM | POA: Diagnosis not present

## 2015-03-13 DIAGNOSIS — N390 Urinary tract infection, site not specified: Secondary | ICD-10-CM | POA: Diagnosis not present

## 2015-03-13 DIAGNOSIS — M170 Bilateral primary osteoarthritis of knee: Secondary | ICD-10-CM | POA: Diagnosis not present

## 2015-03-13 DIAGNOSIS — N138 Other obstructive and reflux uropathy: Secondary | ICD-10-CM | POA: Diagnosis not present

## 2015-03-18 DIAGNOSIS — H01005 Unspecified blepharitis left lower eyelid: Secondary | ICD-10-CM | POA: Diagnosis not present

## 2015-03-18 DIAGNOSIS — H01002 Unspecified blepharitis right lower eyelid: Secondary | ICD-10-CM | POA: Diagnosis not present

## 2015-03-18 DIAGNOSIS — H01001 Unspecified blepharitis right upper eyelid: Secondary | ICD-10-CM | POA: Diagnosis not present

## 2015-03-18 DIAGNOSIS — H01004 Unspecified blepharitis left upper eyelid: Secondary | ICD-10-CM | POA: Diagnosis not present

## 2015-03-18 DIAGNOSIS — H3531 Nonexudative age-related macular degeneration: Secondary | ICD-10-CM | POA: Diagnosis not present

## 2015-03-25 ENCOUNTER — Other Ambulatory Visit: Payer: Self-pay | Admitting: Cardiology

## 2015-03-27 IMAGING — CR DG CHEST 2V
2 series · 2 of 2 positions shown · non-contrast
Comparison: Chest x-ray of 03/25/2011

CLINICAL DATA: Syncope today, confusion, history of CABG and
coronary artery stents

CHEST - 2 VIEW

[w chest lat]
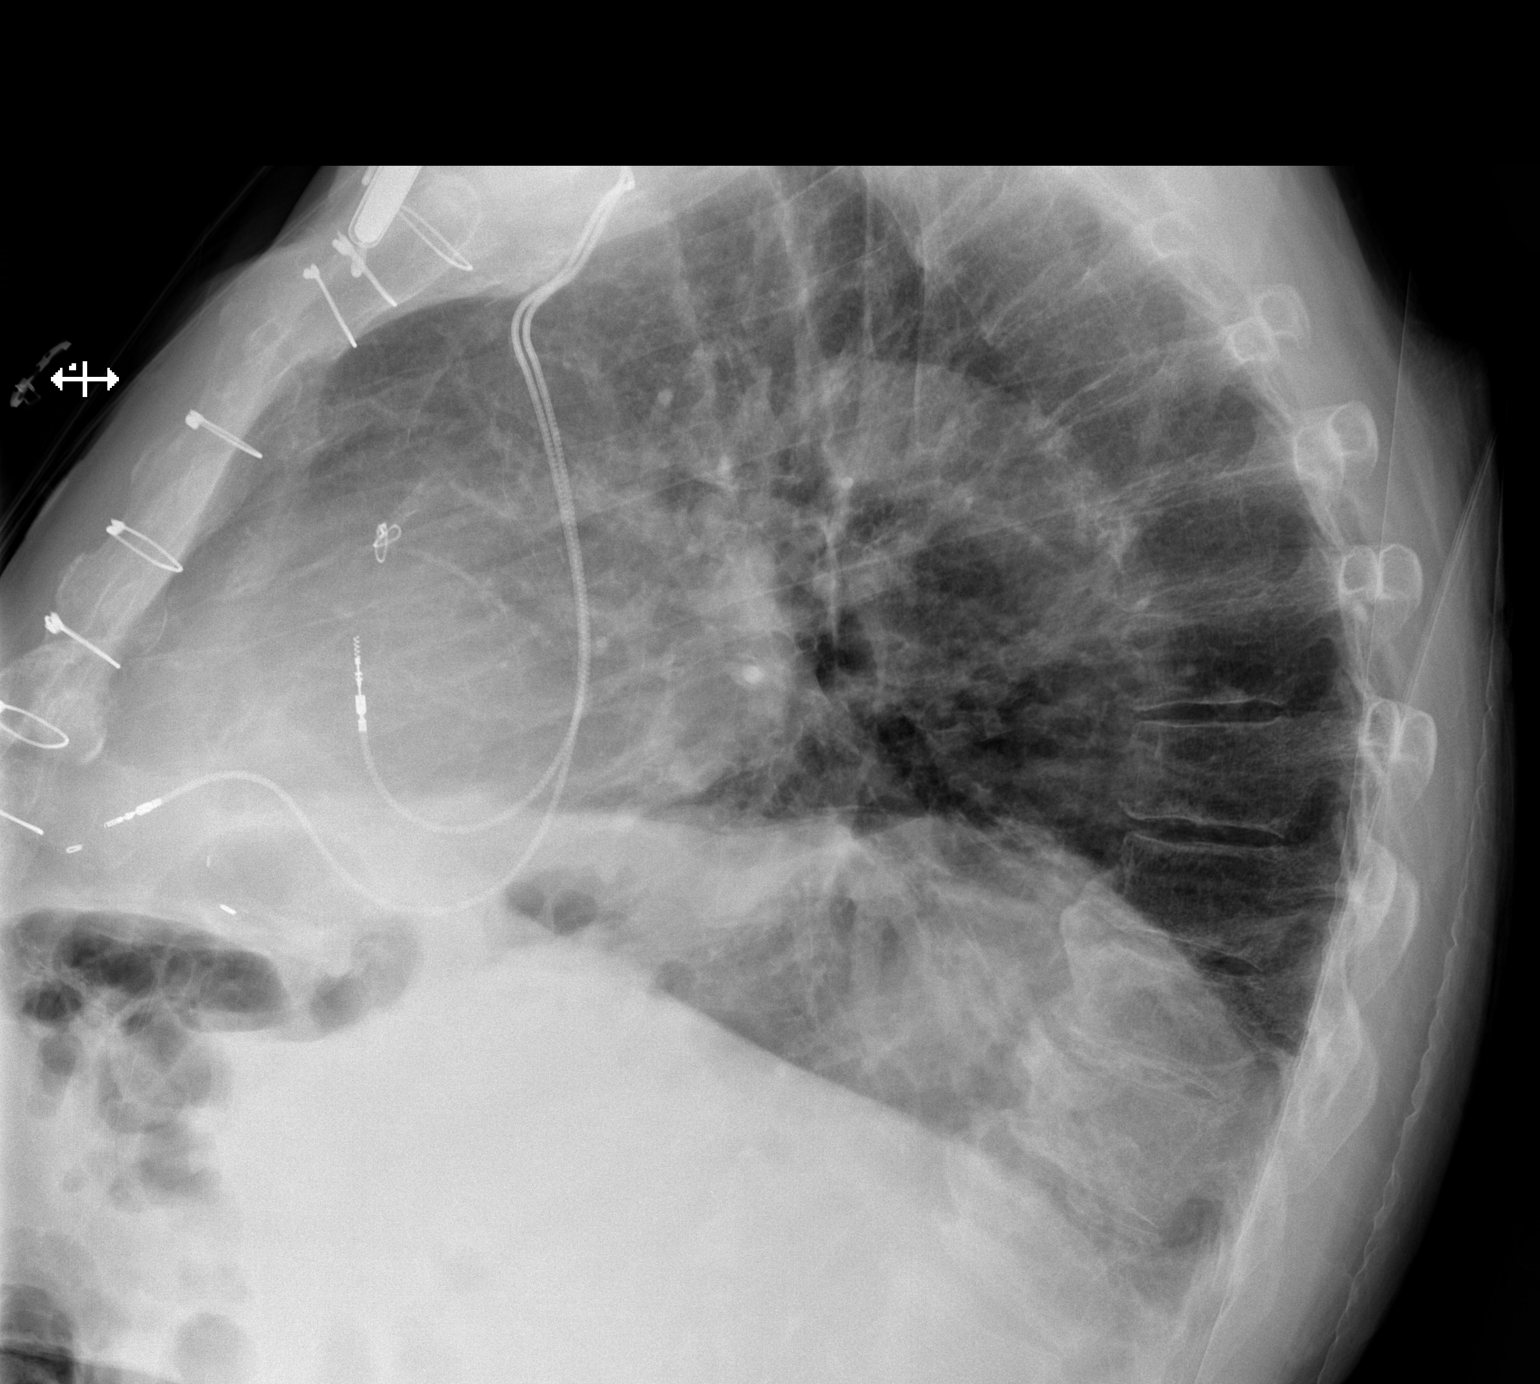

[x chest ap]
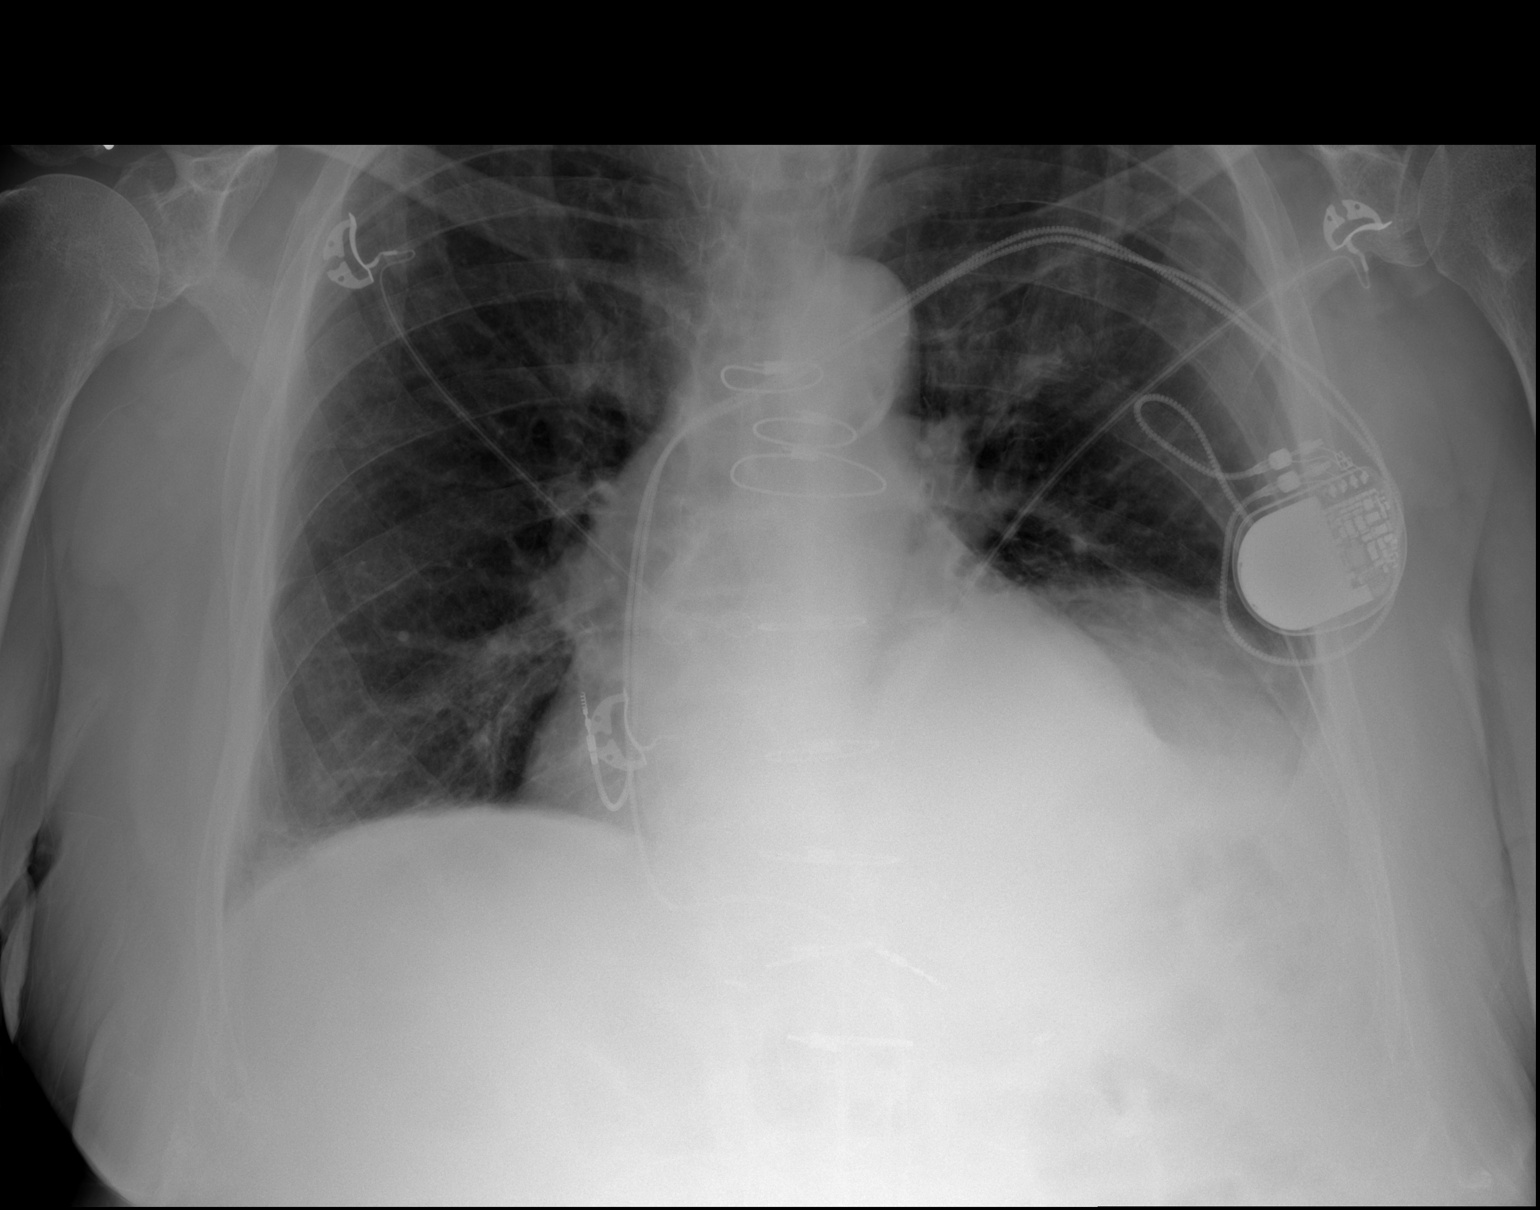

[2 of 2 positions shown; findings below may reference images not displayed]

FINDINGS: The lungs are hyperaerated consistent with emphysema.  No
focal infiltrate or effusion is seen.  Cardiomegaly is stable.  A
permanent pacemaker remains.  There is chronic elevation of left
hemidiaphragm.  The bones are osteopenic.
IMPRESSION: Emphysema.  Stable cardiomegaly with permanent pacemaker.  No
active lung disease.

## 2015-04-11 ENCOUNTER — Ambulatory Visit (INDEPENDENT_AMBULATORY_CARE_PROVIDER_SITE_OTHER): Payer: Medicare Other | Admitting: Internal Medicine

## 2015-04-11 ENCOUNTER — Ambulatory Visit (INDEPENDENT_AMBULATORY_CARE_PROVIDER_SITE_OTHER)
Admission: RE | Admit: 2015-04-11 | Discharge: 2015-04-11 | Disposition: A | Discharge status: Home / Self Care | Payer: Medicare Other | Source: Ambulatory Visit | Attending: Internal Medicine | Admitting: Internal Medicine

## 2015-04-11 ENCOUNTER — Encounter: Payer: Self-pay | Admitting: Internal Medicine

## 2015-04-11 ENCOUNTER — Telehealth: Payer: Self-pay

## 2015-04-11 VITALS — BP 130/80 | HR 77 | Temp 98.5°F | Ht 70.0 in | Wt 164.8 lb

## 2015-04-11 DIAGNOSIS — R05 Cough: Secondary | ICD-10-CM

## 2015-04-11 DIAGNOSIS — R509 Fever, unspecified: Secondary | ICD-10-CM | POA: Diagnosis not present

## 2015-04-11 DIAGNOSIS — I10 Essential (primary) hypertension: Secondary | ICD-10-CM | POA: Diagnosis not present

## 2015-04-11 DIAGNOSIS — I2510 Atherosclerotic heart disease of native coronary artery without angina pectoris: Secondary | ICD-10-CM | POA: Diagnosis not present

## 2015-04-11 DIAGNOSIS — I2584 Coronary atherosclerosis due to calcified coronary lesion: Secondary | ICD-10-CM

## 2015-04-11 DIAGNOSIS — R059 Cough, unspecified: Secondary | ICD-10-CM | POA: Insufficient documentation

## 2015-04-11 DIAGNOSIS — R739 Hyperglycemia, unspecified: Secondary | ICD-10-CM | POA: Diagnosis not present

## 2015-04-11 MED ORDER — LEVOFLOXACIN 250 MG PO TABS: 250.0000 mg | ORAL_TABLET | Freq: Every day | ORAL | Status: DC

## 2015-04-11 MED ORDER — HYDROCODONE-HOMATROPINE 5-1.5 MG/5ML PO SYRP: 5.0000 mL | ORAL_SOLUTION | Freq: Four times a day (QID) | ORAL | Status: DC | PRN

## 2015-04-11 NOTE — Progress Notes (Signed)
Subjective:    Patient ID: Dwayne [name], male    DOB: 1932-08-25, 81 y.o.   MRN: [id number]  HPI Here with acute onset mild to mod 2-3 days ST, HA, general weakness and malaise, with prod cough greenish sputum, but Pt denies chest pain, increased sob or doe, wheezing, orthopnea, PND, increased LE swelling, palpitations, dizziness or syncope.  Pt denies new neurological symptoms such as new headache, or facial or extremity weakness or numbness   Pt denies polydipsia, polyuria Past Medical History  Diagnosis Date  . Atrial fibrillation     a. Was on Coumadin until GIB 01/2014.  [name] Kitchen Hyperlipidemia   . Bronchiectasis without acute exacerbation   . Disorders of diaphragm   . Plantar fascial fibromatosis   . Epistaxis   . Unspecified sinusitis (chronic)   . Allergic rhinitis, cause unspecified   . Spinal stenosis, unspecified region other than cervical   . Hypertension   . Coronary artery disease     a. s/p CABG 1993. b. s/p PCI of SVG to OM '03.  . Tachy-brady syndrome     s/p Medtronic PPM '07  . Myocardial infarction   . GERD (gastroesophageal reflux disease)   . Arthritis   . H/O: GI bleed     a. 01/2014 - presumed diverticular. Received PRBC, Vit K. Coumadin put on hold.  . Diverticulosis   . LV dysfunction     a. Echo 08/2013: EF 45-50%.   Past Surgical History  Procedure Laterality Date  . Coronary artery bypass graft      LIMA to LAD, SVG to OM, SVG to left circumflex, SVG to PD/PLSA  . Pacemaker insertion      2007, Medtronic dual-chamber  . Kidney cyst removal    . Doppler echocardiography  2011  . Cholecystectomy    . Vertebroplasty    . Cataract extraction w/ intraocular lens  implant, bilateral    . Hip arthroplasty Right 12/10/2013    Procedure: Right Hip Cemented Hemiarthroplasty;  Surgeon: Eldred Manges, MD;  Location: [facility] OR;  Service: Orthopedics;  Laterality: Right;  Right Hip Cemented Hemiarthroplasty  . Colonoscopy    . Permanent pacemaker generator change N/A  08/14/2014    Procedure: PERMANENT PACEMAKER GENERATOR CHANGE;  Surgeon: Marinus Maw, MD;  Location: [facility] CATH LAB;  Service: Cardiovascular;  Laterality: N/A;    reports that he has quit smoking. He has never used smokeless tobacco. He reports that he does not drink alcohol or use illicit drugs. family history includes Diabetes in his son; Stroke in his mother. There is no history of Colon cancer, Heart disease, Throat cancer, Pancreatic cancer, Kidney disease, Liver disease, or Osteoporosis. Allergies  Allergen Reactions  . Asparaginase Derivatives Other (See Comments)    Makes jittery & causes vision problems, headaches  . Doxycycline Hyclate Diarrhea and Nausea And Vomiting  . Erythromycin Diarrhea and Nausea And Vomiting  . Tetracycline Diarrhea and Nausea And Vomiting  . Ambien [Zolpidem Tartrate] Other (See Comments)    unknown  . Caffeine Other (See Comments)    Interrupts sleep  . Clindamycin Other (See Comments)    REACTION: upset stomach  . Lactose Intolerance (Gi) Other (See Comments)    unknown  . Lyrica [Pregabalin] Other (See Comments)    unknown  . Metronidazole Itching and Rash  . Nitrostat [Nitroglycerin] Diarrhea  . Penicillins Hives  . Sulfonamide Derivatives Hives   Current Outpatient Prescriptions on File Prior to Visit  Medication Sig Dispense Refill  .  atorvastatin (LIPITOR) 40 MG tablet Take 1 tablet (40 mg total) by mouth daily. 90 tablet 3  . Calcium-Magnesium-Vitamin D (CITRACAL CALCIUM+D) 600-40-500 MG-MG-UNIT TB24 Take 2 tablets by mouth daily.     . cyanocobalamin (,VITAMIN B-12,) 1000 MCG/ML injection Inject 1,000 mcg into the muscle every 21 ( twenty-one) days.    [name] Kitchen. ELIQUIS 5 MG TABS tablet Take 1 tablet by mouth two  times daily 180 tablet 1  . ferrous sulfate 325 (65 FE) MG tablet Take 1 tablet (325 mg total) by mouth 2 (two) times daily with a meal. (Patient taking differently: Take 325 mg by mouth daily with breakfast. ) 180 tablet 1  .  fexofenadine (ALLEGRA) 180 MG tablet Take 180 mg by mouth at bedtime as needed (for allergies).     . finasteride (PROSCAR) 5 MG tablet Take 1 tablet by mouth at  bedtime 90 tablet 3  . metoprolol tartrate (LOPRESSOR) 25 MG tablet Take 1 tablet by mouth  twice a day 60 tablet 3  . Multiple Vitamins-Minerals (OCUVITE-LUTEIN PO) Take 1 tablet by mouth daily.     . nitroGLYCERIN (NITROSTAT) 0.4 MG SL tablet Place 1 tablet (0.4 mg total) under the tongue every 5 (five) minutes x 3 doses as needed for chest pain. 25 tablet 1  . omeprazole (PRILOSEC) 40 MG capsule Take 40 mg by mouth daily.      . polyethylene glycol (MIRALAX / GLYCOLAX) packet Take 17 g by mouth daily as needed for moderate constipation.     [name] Kitchen. PROLIA 60 MG/ML SOLN injection Inject 60 mg into the skin every 6 (six) months.     . terazosin (HYTRIN) 5 MG capsule Take 1 capsule by mouth  daily 90 capsule 3  . triamcinolone cream (KENALOG) 0.1 % Apply 1 application topically as needed.  0   No current facility-administered medications on file prior to visit.     Review of Systems  Constitutional: Negative for unusual diaphoresis or night sweats HENT: Negative for ringing in ear or discharge Eyes: Negative for double vision or worsening visual disturbance.  Respiratory: Negative for choking and stridor.   Gastrointestinal: Negative for vomiting or other signifcant bowel change Genitourinary: Negative for hematuria or change in urine volume.  Musculoskeletal: Negative for other MSK pain or swelling Skin: Negative for color change and worsening wound.  Neurological: Negative for tremors and numbness other than noted  Psychiatric/Behavioral: Negative for decreased concentration or agitation other than above       Objective:   Physical Exam BP 130/80 mmHg  Pulse 77  Temp(Src) 98.5 F (36.9 C) (Oral)  Ht 5\' 10"  (1.778 m)  Wt 164 lb 12 oz (74.73 kg)  BMI 23.64 kg/m2  SpO2 96% VS noted, mild ill Constitutional: Pt appears in no  significant distress HENT: Head: NCAT.  Right Ear: External ear normal.  Left Ear: External ear normal.  Eyes: . Pupils are equal, round, and reactive to light. Conjunctivae and EOM are normal Neck: Normal range of motion. Neck supple.  Cardiovascular: Normal rate and regular rhythm.   Pulmonary/Chest: Effort normal and breath sounds decreased with few RLL rales, no wheezing.  Neurological: Pt is alert. Not confused , motor grossly intact Skin: Skin is warm. No rash, no LE edema Psychiatric: Pt behavior is normal. No agitation.         Assessment & Plan:

## 2015-04-11 NOTE — Progress Notes (Signed)
Pre visit review using our clinic review tool, if applicable. No additional management support is needed unless otherwise documented below in the visit note. 

## 2015-04-11 NOTE — Telephone Encounter (Signed)
Can you do a Prior Auth on Prolia please

## 2015-04-11 NOTE — Patient Instructions (Signed)
Please take all new medication as prescribed - the antibiotic, and the cough medicine if needed  Please continue all other medications as before, and refills have been done if requested.  Please have the pharmacy call with any other refills you may need.  Please keep your appointments with your specialists as you may have planned  Please go to the XRAY Department in the Basement (go straight as you get off the elevator) for the x-ray testing  You will be contacted by phone if any changes need to be made immediately.  Otherwise, you will receive a letter about your results with an explanation, but please check with MyChart first.  Please remember to sign up for MyChart if you have not done so, as this will be important to you in the future with finding out test results, communicating by private email, and scheduling acute appointments online when needed.

## 2015-04-14 ENCOUNTER — Telehealth: Payer: Self-pay | Admitting: Internal Medicine

## 2015-04-14 MED ORDER — LEVOFLOXACIN 250 MG PO TABS: 250.0000 mg | ORAL_TABLET | Freq: Every day | ORAL | Status: DC

## 2015-04-14 NOTE — Telephone Encounter (Signed)
Previous script md sent to [facility] resent to local pharmacy...Raechel Chute/lmb

## 2015-04-14 NOTE — Telephone Encounter (Signed)
Please send prescription levofloxacin (LEVAQUIN) 250 MG tablet [facility][id number] [facility] [address]

## 2015-04-14 NOTE — Telephone Encounter (Signed)
He was in here on 4/22 and it looks like the antibiotic was sent to his mail order pharmacy and [facility]

## 2015-04-14 NOTE — Telephone Encounter (Signed)
No antibiotics can not be refill unless they have appt...Raechel Chute/lmb

## 2015-04-15 ENCOUNTER — Encounter: Payer: Self-pay | Admitting: Internal Medicine

## 2015-04-15 NOTE — Assessment & Plan Note (Signed)
stable overall by history and exam, recent data reviewed with pt, and pt to continue medical treatment as before,  to f/u any worsening symptoms or concerns  Lab Results  Component Value Date   HGBA1C 5.9 03/11/2015

## 2015-04-15 NOTE — Assessment & Plan Note (Signed)
stable overall by history and exam, recent data reviewed with pt, and pt to continue medical treatment as before,  to f/u any worsening symptoms or concerns BP Readings from Last 3 Encounters:  04/11/15 130/80  03/11/15 122/76  02/07/15 118/80

## 2015-04-15 NOTE — Assessment & Plan Note (Signed)
C/w bronchitis vs pna, for cxr, levaquin course asd, cough med prn,  to f/u any worsening symptoms or concerns

## 2015-04-21 NOTE — Telephone Encounter (Signed)
I have sent pt's info for Prolia insurance verification and will notify you once I have a response. Thank you. °

## 2015-04-24 ENCOUNTER — Ambulatory Visit (INDEPENDENT_AMBULATORY_CARE_PROVIDER_SITE_OTHER): Payer: Medicare Other | Admitting: Family

## 2015-04-24 ENCOUNTER — Encounter: Payer: Self-pay | Admitting: Family

## 2015-04-24 VITALS — BP 132/82 | HR 66 | Temp 97.5°F | Resp 18 | Ht 70.0 in | Wt 161.0 lb

## 2015-04-24 DIAGNOSIS — I2510 Atherosclerotic heart disease of native coronary artery without angina pectoris: Secondary | ICD-10-CM | POA: Diagnosis not present

## 2015-04-24 DIAGNOSIS — E538 Deficiency of other specified B group vitamins: Secondary | ICD-10-CM

## 2015-04-24 DIAGNOSIS — I2584 Coronary atherosclerosis due to calcified coronary lesion: Secondary | ICD-10-CM | POA: Diagnosis not present

## 2015-04-24 DIAGNOSIS — R05 Cough: Secondary | ICD-10-CM

## 2015-04-24 DIAGNOSIS — R059 Cough, unspecified: Secondary | ICD-10-CM

## 2015-04-24 MED ORDER — MOXIFLOXACIN HCL 400 MG PO TABS: 400.0000 mg | ORAL_TABLET | Freq: Every day | ORAL | Status: DC

## 2015-04-24 MED ORDER — CYANOCOBALAMIN 1000 MCG/ML IJ SOLN
1000.0000 ug | Freq: Once | INTRAMUSCULAR | Status: AC
  Administered 2015-04-24: 1000 ug via INTRAMUSCULAR

## 2015-04-24 NOTE — Progress Notes (Signed)
Subjective:    Patient ID: Dwayne [name], male    DOB: December 30, 1931, 81 y.o.   MRN: [id number]  Chief Complaint  Patient presents with  . Cough    x2 weeks, congestion, productive cough, drainage, says its down in the chest, wife states he also needs b12 injection    HPI:  Dwayne [name] is a 81 y.o. male  With a previous medical history of coronary artery disease, atrial fibrillation, hypertension, diverticulitis and anemia who presents today for an acute office visit.  Associated symptoms of congestion, productive cough, and drainage that has been going on for approximately 2 weeks. Was recently seen in the office and diagnosed with bronchitis and was treated with levaquin which he has one pill left. Originally was improved, however then experienced some worsening. Indicates that "it won't go away."   Allergies  Allergen Reactions  . Asparaginase Derivatives Other (See Comments)    Makes jittery & causes vision problems, headaches  . Doxycycline Hyclate Diarrhea and Nausea And Vomiting  . Erythromycin Diarrhea and Nausea And Vomiting  . Tetracycline Diarrhea and Nausea And Vomiting  . Ambien [Zolpidem Tartrate] Other (See Comments)    unknown  . Caffeine Other (See Comments)    Interrupts sleep  . Clindamycin Other (See Comments)    REACTION: upset stomach  . Lactose Intolerance (Gi) Other (See Comments)    unknown  . Lyrica [Pregabalin] Other (See Comments)    unknown  . Metronidazole Itching and Rash  . Nitrostat [Nitroglycerin] Diarrhea  . Penicillins Hives  . Sulfonamide Derivatives Hives    Current Outpatient Prescriptions on File Prior to Visit  Medication Sig Dispense Refill  . atorvastatin (LIPITOR) 40 MG tablet Take 1 tablet (40 mg total) by mouth daily. 90 tablet 3  . BELSOMRA 10 MG TABS Take 1 tablet by mouth at bedtime.  0  . Calcium-Magnesium-Vitamin D (CITRACAL CALCIUM+D) 600-40-500 MG-MG-UNIT TB24 Take 2 tablets by mouth daily.     . cyanocobalamin (,VITAMIN  B-12,) 1000 MCG/ML injection Inject 1,000 mcg into the muscle every 21 ( twenty-one) days.    [name] Kitchen. ELIQUIS 5 MG TABS tablet Take 1 tablet by mouth two  times daily 180 tablet 1  . ferrous sulfate 325 (65 FE) MG tablet Take 1 tablet (325 mg total) by mouth 2 (two) times daily with a meal. (Patient taking differently: Take 325 mg by mouth daily with breakfast. ) 180 tablet 1  . fexofenadine (ALLEGRA) 180 MG tablet Take 180 mg by mouth at bedtime as needed (for allergies).     . finasteride (PROSCAR) 5 MG tablet Take 1 tablet by mouth at  bedtime 90 tablet 3  . HYDROcodone-homatropine (HYCODAN) 5-1.5 MG/5ML syrup Take 5 mLs by mouth every 6 (six) hours as needed for cough. 180 mL 0  . levofloxacin (LEVAQUIN) 250 MG tablet Take 1 tablet (250 mg total) by mouth daily. 10 tablet 0  . metoprolol tartrate (LOPRESSOR) 25 MG tablet Take 1 tablet by mouth  twice a day 60 tablet 3  . Multiple Vitamins-Minerals (OCUVITE-LUTEIN PO) Take 1 tablet by mouth daily.     . nitroGLYCERIN (NITROSTAT) 0.4 MG SL tablet Place 1 tablet (0.4 mg total) under the tongue every 5 (five) minutes x 3 doses as needed for chest pain. 25 tablet 1  . omeprazole (PRILOSEC) 40 MG capsule Take 40 mg by mouth daily.      . polyethylene glycol (MIRALAX / GLYCOLAX) packet Take 17 g by mouth daily as needed for  moderate constipation.     [name] Kitchen. PROLIA 60 MG/ML SOLN injection Inject 60 mg into the skin every 6 (six) months.     . terazosin (HYTRIN) 5 MG capsule Take 1 capsule by mouth  daily 90 capsule 3  . triamcinolone cream (KENALOG) 0.1 % Apply 1 application topically as needed.  0   No current facility-administered medications on file prior to visit.     Review of Systems  Constitutional: Negative for fever and chills.  HENT: Positive for congestion.   Respiratory: Positive for cough. Negative for chest tightness, shortness of breath and wheezing.       Objective:    BP 132/82 mmHg  Pulse 66  Temp(Src) 97.5 F (36.4 C) (Oral)  Resp  18  Ht 5\' 10"  (1.778 m)  Wt 161 lb (73.029 kg)  BMI 23.10 kg/m2  SpO2 95% Nursing note and vital signs reviewed.  Physical Exam  Constitutional: He is oriented to person, place, and time. He appears well-developed and well-nourished. No distress.  HENT:  Right Ear: Hearing, tympanic membrane, external ear and ear canal normal.  Left Ear: Hearing, tympanic membrane, external ear and ear canal normal.  Nose: Nose normal. Right sinus exhibits no maxillary sinus tenderness and no frontal sinus tenderness. Left sinus exhibits no maxillary sinus tenderness and no frontal sinus tenderness.  Mouth/Throat: Uvula is midline, oropharynx is clear and moist and mucous membranes are normal.  Hearing aids present  Cardiovascular: Normal rate, regular rhythm, normal heart sounds and intact distal pulses.   Pulmonary/Chest: Effort normal. He has wheezes. He has rhonchi.  Neurological: He is alert and oriented to person, place, and time.  Skin: Skin is warm and dry.  Psychiatric: He has a normal mood and affect. His behavior is normal. Judgment and thought content normal.       Assessment & Plan:

## 2015-04-24 NOTE — Progress Notes (Signed)
Pre visit review using our clinic review tool, if applicable. No additional management support is needed unless otherwise documented below in the visit note. 

## 2015-04-24 NOTE — Assessment & Plan Note (Addendum)
Cough/bronchitis refractory to previous Levaquin treatment. Given symptoms worsening and rhonchi on assessment start moxifloxacin. Start Mucinex.  Continue over-the-counter medications as needed for symptom relief and supportive care. Follow-up if symptoms worsen or fail to improve.

## 2015-04-24 NOTE — Addendum Note (Signed)
Addended by: Mercer PodWRENN, Bhakti Labella E on: 04/24/2015 03:33 PM   Modules accepted: Orders

## 2015-04-24 NOTE — Patient Instructions (Signed)
Thank you for choosing [facility].  Summary/Instructions:  Your prescription(s) have been submitted to your pharmacy or been printed and provided for you. Please take as directed and contact our office if you believe you are having problem(s) with the medication(s) or have any questions.  If your symptoms worsen or fail to improve, please contact our office for further instruction, or in case of emergency go directly to the emergency room at the closest medical facility.   General Recommendations:    Please drink plenty of fluids.  Get plenty of rest   Sleep in humidified air  Use saline nasal sprays  Netti pot   OTC Medications:  Decongestants - helps relieve congestion   Flonase (generic fluticasone) or Nasacort (generic triamcinolone) - please make sure to use the "cross-over" technique at a 45 degree angle towards the opposite eye as opposed to straight up the nasal passageway.   If you have HIGH BLOOD PRESSURE - Coricidin HBP; AVOID any product that is -D as this contains pseudoephedrine which may increase your blood pressure.  Afrin (oxymetazoline) every 6-8 hours for up to 3 days.   Allergies - helps relieve runny nose, itchy eyes and sneezing   Claritin (generic loratidine), Allegra (fexofenidine), or Zyrtec (generic cyrterizine) for runny nose. These medications should not cause drowsiness.  Note - Benadryl (generic diphenhydramine) may be used however may cause drowsiness  Cough -   Delsym or Robitussin (generic dextromethorphan)  Expectorants - helps loosen mucus to ease removal   Mucinex (generic guaifenesin) as directed on the package.  Headaches / General Aches   Tylenol (generic acetaminophen) - DO NOT EXCEED 3 grams (3,000 mg) in a 24 hour time period  Advil/Motrin (generic ibuprofen)   Sore Throat -   Salt water gargle   Chloraseptic (generic benzocaine) spray or lozenges / Sucrets (generic dyclonine)      

## 2015-04-25 NOTE — Telephone Encounter (Signed)
Notified pt with Rose response. Made appt for 04/30/15. Inform kesh to order med...Raechel Chute/lmb

## 2015-04-25 NOTE — Telephone Encounter (Signed)
I have rec'd pt's insurance verification for Prolia.  [name] has an estimated responsibility of $0 for his injection.  I have sent a copy of the summary of benefits to be scanned into his chart.  If you have any questions, please let me know. Thank you.

## 2015-04-30 ENCOUNTER — Telehealth: Payer: Self-pay | Admitting: Internal Medicine

## 2015-04-30 ENCOUNTER — Ambulatory Visit (INDEPENDENT_AMBULATORY_CARE_PROVIDER_SITE_OTHER): Payer: Medicare Other | Admitting: Geriatric Medicine

## 2015-04-30 DIAGNOSIS — M810 Age-related osteoporosis without current pathological fracture: Secondary | ICD-10-CM

## 2015-04-30 MED ORDER — DENOSUMAB 60 MG/ML ~~LOC~~ SOLN
60.0000 mg | Freq: Once | SUBCUTANEOUS | Status: AC
  Administered 2015-04-30: 60 mg via SUBCUTANEOUS

## 2015-04-30 NOTE — Telephone Encounter (Signed)
Patient needs prolia in November 2016.  Can you please verify with insurance that they will cover?

## 2015-05-05 NOTE — Telephone Encounter (Signed)
I have made a note to check verification at end of September. Thank you.

## 2015-05-15 ENCOUNTER — Ambulatory Visit (INDEPENDENT_AMBULATORY_CARE_PROVIDER_SITE_OTHER): Payer: Medicare Other | Admitting: *Deleted

## 2015-05-15 DIAGNOSIS — E538 Deficiency of other specified B group vitamins: Secondary | ICD-10-CM | POA: Diagnosis not present

## 2015-05-15 MED ORDER — CYANOCOBALAMIN 1000 MCG/ML IJ SOLN
1000.0000 ug | Freq: Once | INTRAMUSCULAR | Status: AC
  Administered 2015-05-15: 1000 ug via INTRAMUSCULAR

## 2015-05-23 ENCOUNTER — Encounter: Payer: Self-pay | Admitting: *Deleted

## 2015-06-16 ENCOUNTER — Ambulatory Visit (INDEPENDENT_AMBULATORY_CARE_PROVIDER_SITE_OTHER): Payer: Medicare Other | Admitting: Internal Medicine

## 2015-06-16 ENCOUNTER — Encounter: Payer: Self-pay | Admitting: Internal Medicine

## 2015-06-16 ENCOUNTER — Other Ambulatory Visit: Payer: Self-pay | Admitting: Cardiology

## 2015-06-16 VITALS — BP 124/80 | HR 72 | Temp 97.9°F | Resp 16 | Ht 70.0 in | Wt 166.5 lb

## 2015-06-16 DIAGNOSIS — G4700 Insomnia, unspecified: Secondary | ICD-10-CM | POA: Diagnosis not present

## 2015-06-16 DIAGNOSIS — I2584 Coronary atherosclerosis due to calcified coronary lesion: Secondary | ICD-10-CM | POA: Diagnosis not present

## 2015-06-16 DIAGNOSIS — I2510 Atherosclerotic heart disease of native coronary artery without angina pectoris: Secondary | ICD-10-CM | POA: Diagnosis not present

## 2015-06-16 DIAGNOSIS — I10 Essential (primary) hypertension: Secondary | ICD-10-CM

## 2015-06-16 MED ORDER — DOXEPIN HCL 6 MG PO TABS: 1.0000 | ORAL_TABLET | Freq: Every evening | ORAL | Status: DC | PRN

## 2015-06-16 NOTE — Patient Instructions (Signed)
Insomnia Insomnia is frequent trouble falling and/or staying asleep. Insomnia can be a long term problem or a short term problem. Both are common. Insomnia can be a short term problem when the wakefulness is related to a certain stress or worry. Long term insomnia is often related to ongoing stress during waking hours and/or poor sleeping habits. Overtime, sleep deprivation itself can make the problem worse. Every little thing feels more severe because you are overtired and your ability to cope is decreased. CAUSES   Stress, anxiety, and depression.  Poor sleeping habits.  Distractions such as TV in the bedroom.  Naps close to bedtime.  Engaging in emotionally charged conversations before bed.  Technical reading before sleep.  Alcohol and other sedatives. They may make the problem worse. They can hurt normal sleep patterns and normal dream activity.  Stimulants such as caffeine for several hours prior to bedtime.  Pain syndromes and shortness of breath can cause insomnia.  Exercise late at night.  Changing time zones may cause sleeping problems (jet lag). It is sometimes helpful to have someone observe your sleeping patterns. They should look for periods of not breathing during the night (sleep apnea). They should also look to see how long those periods last. If you live alone or observers are uncertain, you can also be observed at a sleep clinic where your sleep patterns will be professionally monitored. Sleep apnea requires a checkup and treatment. Give your caregivers your medical history. Give your caregivers observations your family has made about your sleep.  SYMPTOMS   Not feeling rested in the morning.  Anxiety and restlessness at bedtime.  Difficulty falling and staying asleep. TREATMENT   Your caregiver may prescribe treatment for an underlying medical disorders. Your caregiver can give advice or help if you are using alcohol or other drugs for self-medication. Treatment  of underlying problems will usually eliminate insomnia problems.  Medications can be prescribed for short time use. They are generally not recommended for lengthy use.  Over-the-counter sleep medicines are not recommended for lengthy use. They can be habit forming.  You can promote easier sleeping by making lifestyle changes such as:  Using relaxation techniques that help with breathing and reduce muscle tension.  Exercising earlier in the day.  Changing your diet and the time of your last meal. No night time snacks.  Establish a regular time to go to bed.  Counseling can help with stressful problems and worry.  Soothing music and white noise may be helpful if there are background noises you cannot remove.  Stop tedious detailed work at least one hour before bedtime. HOME CARE INSTRUCTIONS   Keep a diary. Inform your caregiver about your progress. This includes any medication side effects. See your caregiver regularly. Take note of:  Times when you are asleep.  Times when you are awake during the night.  The quality of your sleep.  How you feel the next day. This information will help your caregiver care for you.  Get out of bed if you are still awake after 15 minutes. Read or do some quiet activity. Keep the lights down. Wait until you feel sleepy and go back to bed.  Keep regular sleeping and waking hours. Avoid naps.  Exercise regularly.  Avoid distractions at bedtime. Distractions include watching television or engaging in any intense or detailed activity like attempting to balance the household checkbook.  Develop a bedtime ritual. Keep a familiar routine of bathing, brushing your teeth, climbing into bed at the same   time each night, listening to soothing music. Routines increase the success of falling to sleep faster.  Use relaxation techniques. This can be using breathing and muscle tension release routines. It can also include visualizing peaceful scenes. You can  also help control troubling or intruding thoughts by keeping your mind occupied with boring or repetitive thoughts like the old concept of counting sheep. You can make it more creative like imagining planting one beautiful flower after another in your backyard garden.  During your day, work to eliminate stress. When this is not possible use some of the previous suggestions to help reduce the anxiety that accompanies stressful situations. MAKE SURE YOU:   Understand these instructions.  Will watch your condition.  Will get help right away if you are not doing well or get worse. Document Released: 12/03/2000 Document Revised: 02/28/2012 Document Reviewed: 01/03/2008 Exit[employer] Patient Information 2015 [employer]. This information is not intended to replace advice given to you by your health care provider. Make sure you discuss any questions you have with your health care provider.  

## 2015-06-16 NOTE — Progress Notes (Signed)
Subjective:  Patient ID: Dwayne [name], male    DOB: 1932-02-06  Age: 81 y.o. MRN: [id number]  CC: Hypertension   HPI [name] presents for a BP check and to discuss belsomra - he and his wife think it may be having a negative effect on his personality (anger, irritability) and want to try a different medication for insomnia, he offers no other complaints today.  Outpatient Prescriptions Prior to Visit  Medication Sig Dispense Refill  . atorvastatin (LIPITOR) 40 MG tablet Take 1 tablet (40 mg total) by mouth daily. 90 tablet 3  . Calcium-Magnesium-Vitamin D (CITRACAL CALCIUM+D) 600-40-500 MG-MG-UNIT TB24 Take 2 tablets by mouth daily.     . cyanocobalamin (,VITAMIN B-12,) 1000 MCG/ML injection Inject 1,000 mcg into the muscle every 21 ( twenty-one) days.    [name] Kitchen ELIQUIS 5 MG TABS tablet Take 1 tablet by mouth two  times daily 180 tablet 1  . ferrous sulfate 325 (65 FE) MG tablet Take 1 tablet (325 mg total) by mouth 2 (two) times daily with a meal. (Patient taking differently: Take 325 mg by mouth daily with breakfast. ) 180 tablet 1  . fexofenadine (ALLEGRA) 180 MG tablet Take 180 mg by mouth at bedtime as needed (for allergies).     . finasteride (PROSCAR) 5 MG tablet Take 1 tablet by mouth at  bedtime 90 tablet 3  . HYDROcodone-homatropine (HYCODAN) 5-1.5 MG/5ML syrup Take 5 mLs by mouth every 6 (six) hours as needed for cough. 180 mL 0  . metoprolol tartrate (LOPRESSOR) 25 MG tablet Take 1 tablet by mouth  twice a day 60 tablet 3  . Multiple Vitamins-Minerals (OCUVITE-LUTEIN PO) Take 1 tablet by mouth daily.     . nitroGLYCERIN (NITROSTAT) 0.4 MG SL tablet Place 1 tablet (0.4 mg total) under the tongue every 5 (five) minutes x 3 doses as needed for chest pain. 25 tablet 1  . omeprazole (PRILOSEC) 40 MG capsule Take 40 mg by mouth daily.      . polyethylene glycol (MIRALAX / GLYCOLAX) packet Take 17 g by mouth daily as needed for moderate constipation.     [name] Kitchen PROLIA 60 MG/ML SOLN  injection Inject 60 mg into the skin every 6 (six) months.     . terazosin (HYTRIN) 5 MG capsule Take 1 capsule by mouth  daily 90 capsule 3  . triamcinolone cream (KENALOG) 0.1 % Apply 1 application topically as needed.  0  . BELSOMRA 10 MG TABS Take 1 tablet by mouth at bedtime.  0  . levofloxacin (LEVAQUIN) 250 MG tablet Take 1 tablet (250 mg total) by mouth daily. 10 tablet 0  . moxifloxacin (AVELOX) 400 MG tablet Take 1 tablet (400 mg total) by mouth daily. 7 tablet 0   No facility-administered medications prior to visit.    ROS Review of Systems  Constitutional: Negative.  Negative for fever, chills, diaphoresis, appetite change and fatigue.  HENT: Negative.  Negative for trouble swallowing and voice change.   Eyes: Negative.   Respiratory: Negative.  Negative for cough, chest tightness, shortness of breath and stridor.   Cardiovascular: Negative.  Negative for chest pain, palpitations and leg swelling.  Gastrointestinal: Negative.  Negative for abdominal pain.  Endocrine: Negative.   Genitourinary: Negative.   Musculoskeletal: Negative for back pain and arthralgias.  Skin: Negative.   Allergic/Immunologic: Negative.   Neurological: Negative.   Hematological: Negative.  Negative for adenopathy. Does not bruise/bleed easily.  Psychiatric/Behavioral: Positive for confusion, sleep disturbance, dysphoric mood and  decreased concentration. Negative for suicidal ideas, hallucinations, behavioral problems, self-injury and agitation. The patient is not nervous/anxious and is not hyperactive.     Objective:  BP 124/80 mmHg  Pulse 72  Temp(Src) 97.9 F (36.6 C) (Oral)  Resp 16  Ht 5\' 10"  (1.778 m)  Wt 166 lb 8 oz (75.524 kg)  BMI 23.89 kg/m2  SpO2 96%  BP Readings from Last 3 Encounters:  06/16/15 124/80  04/24/15 132/82  04/11/15 130/80    Wt Readings from Last 3 Encounters:  06/16/15 166 lb 8 oz (75.524 kg)  04/24/15 161 lb (73.029 kg)  04/11/15 164 lb 12 oz (74.73 kg)     Physical Exam  Constitutional: He is oriented to person, place, and time. He appears well-developed and well-nourished. No distress.  HENT:  Head: Normocephalic and atraumatic.  Mouth/Throat: Oropharynx is clear and moist. No oropharyngeal exudate.  Eyes: Conjunctivae are normal. Right eye exhibits no discharge. Left eye exhibits no discharge. No scleral icterus.  Neck: Normal range of motion. Neck supple. No JVD present. No tracheal deviation present. No thyromegaly present.  Cardiovascular: Normal rate, regular rhythm, normal heart sounds and intact distal pulses.  Exam reveals no gallop and no friction rub.   No murmur heard. Pulmonary/Chest: Effort normal and breath sounds normal. No stridor. No respiratory distress. He has no wheezes. He has no rales. He exhibits no tenderness.  Abdominal: Soft. Bowel sounds are normal. He exhibits no distension and no mass. There is no tenderness. There is no rebound and no guarding.  Musculoskeletal: Normal range of motion. He exhibits no edema or tenderness.  Lymphadenopathy:    He has no cervical adenopathy.  Neurological: He is oriented to person, place, and time.  Skin: Skin is warm and dry. No rash noted. He is not diaphoretic. No erythema. No pallor.  Psychiatric: He has a normal mood and affect. His behavior is normal. Judgment and thought content normal.  Vitals reviewed.   Lab Results  Component Value Date   WBC 8.0 03/11/2015   HGB 13.7 03/11/2015   HCT 40.3 03/11/2015   PLT 216.0 03/11/2015   GLUCOSE 95 03/11/2015   CHOL 173 03/11/2015   TRIG 103.0 03/11/2015   HDL 39.90 03/11/2015   LDLCALC 113* 03/11/2015   ALT 13 03/11/2015   AST 18 03/11/2015   NA 140 03/11/2015   K 3.7 03/11/2015   CL 107 03/11/2015   CREATININE 0.92 03/11/2015   BUN 15 03/11/2015   CO2 28 03/11/2015   TSH 1.30 03/11/2015   PSA 3.29 02/07/2015   INR 1.4* 02/21/2014   HGBA1C 5.9 03/11/2015    Dg Chest 2 View  04/12/2015   CLINICAL DATA:   Cough. Congestion and fever for 2 days. Initial encounter.  EXAM: CHEST  2 VIEW  COMPARISON:  Chest radiograph 12/07/2013.  FINDINGS: Cardiopericardial silhouette within normal is for projection. 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 Dual lead LEFT subclavian cardiac pacemaker. Gas is interposed between the hemidiaphragms bilaterally, most compatible with colonic interposition. No convincing evidence of free air in the abdomen. Chronic elevation of the LEFT hemidiaphragm with basilar atelectasis. Thoracic kyphosis. Chronic lower thoracic compression fracture appears unchanged compared to prior exams.  No airspace disease.  No pleural effusion.  IMPRESSION: No acute cardiopulmonary disease. Chronic elevation of the LEFT hemidiaphragm and postsurgical changes of the chest.   Electronically Signed   By: Andreas NewportGeoffrey  Lamke M.D.   On: 04/12/2015 14:13    Assessment & Plan:   Peyton NajjarLarry was seen today  for hypertension.  Diagnoses and all orders for this visit:  Essential hypertension, benign - his BP is well controlled, recent lytes and renal function are stable  Insomnia - will stop belsomra, start silenor Orders: -     Doxepin HCl (SILENOR) 6 MG TABS; Take 1 tablet (6 mg total) by mouth at bedtime as needed.   I have discontinued Mr. Raphael Fitzpatrick, levofloxacin, and moxifloxacin. I am also having him start on Doxepin HCl. Additionally, I am having him maintain his fexofenadine, omeprazole, Calcium-Magnesium-Vitamin D, polyethylene glycol, PROLIA, Multiple Vitamins-Minerals (OCUVITE-LUTEIN PO), ferrous sulfate, cyanocobalamin, terazosin, ELIQUIS, finasteride, nitroGLYCERIN, triamcinolone cream, atorvastatin, metoprolol tartrate, and HYDROcodone-homatropine.  Meds ordered this encounter  Medications  . Doxepin HCl (SILENOR) 6 MG TABS    Sig: Take 1 tablet (6 mg total) by mouth at bedtime as needed.    Dispense:  30 tablet    Refill:  11     Follow-up: Return in about 3 months (around  09/16/2015).  Sanda Linger, MD

## 2015-06-16 NOTE — Progress Notes (Signed)
Pre visit review using our clinic review tool, if applicable. No additional management support is needed unless otherwise documented below in the visit note. 

## 2015-06-17 ENCOUNTER — Ambulatory Visit (INDEPENDENT_AMBULATORY_CARE_PROVIDER_SITE_OTHER): Payer: Medicare Other

## 2015-06-17 DIAGNOSIS — E538 Deficiency of other specified B group vitamins: Secondary | ICD-10-CM

## 2015-06-17 MED ORDER — CYANOCOBALAMIN 1000 MCG/ML IJ SOLN
1000.0000 ug | Freq: Once | INTRAMUSCULAR | Status: AC
  Administered 2015-06-17: 1000 ug via INTRAMUSCULAR

## 2015-06-24 NOTE — Telephone Encounter (Signed)
LVM for pt to call back as soon as possible.   RE: Dwayne [name] inject is covered by Medicare and [employer].

## 2015-06-25 NOTE — Telephone Encounter (Signed)
Spoke to pt and informed of insurance coverage and next renewal for benefits. Pt understood.

## 2015-07-03 ENCOUNTER — Telehealth: Payer: Self-pay | Admitting: Internal Medicine

## 2015-07-03 DIAGNOSIS — G4700 Insomnia, unspecified: Secondary | ICD-10-CM

## 2015-07-03 NOTE — Telephone Encounter (Signed)
Pt wife called in and said that she thought that that dr Yetta Barrejones was going to call in Doxepin HCl (SILENOR) 6 MG TABS [[id number]][[id number]]  To the [facility] to help pt sleep ?

## 2015-07-04 ENCOUNTER — Telehealth: Payer: Self-pay | Admitting: Internal Medicine

## 2015-07-04 MED ORDER — DOXEPIN HCL 6 MG PO TABS: 1.0000 | ORAL_TABLET | Freq: Every evening | ORAL | Status: DC | PRN

## 2015-07-04 NOTE — Telephone Encounter (Signed)
PA for Silenor 6 mg tablet was not approved.

## 2015-07-04 NOTE — Telephone Encounter (Signed)
States pharmacy sent over a PA for doxepin HCI.  States patient only has enough to get through until Sunday.  Is requesting to pick up sample on Monday.

## 2015-07-04 NOTE — Telephone Encounter (Signed)
Okay 

## 2015-07-04 NOTE — Telephone Encounter (Signed)
Rx resent per pt request.   Doxepin HCl (SILENOR) 6 MG TABS 30 tablet 11 07/04/2015      Sig - Route: Take 1 tablet (6 mg total) by mouth at bedtime as needed. - Oral    E-Prescribing Status: Receipt confirmed by pharmacy (07/04/2015 11:18 AM EDT)

## 2015-07-07 NOTE — Telephone Encounter (Signed)
Pt wife called in and wants to to know if Dr Yetta Barrejones has samples of the Silenor that pt can get?

## 2015-07-07 NOTE — Telephone Encounter (Signed)
Patient notified//lmovm  

## 2015-07-07 NOTE — Telephone Encounter (Signed)
Yes, they will be in a bag in the cabinet upfront for him to pick up

## 2015-07-17 ENCOUNTER — Ambulatory Visit (INDEPENDENT_AMBULATORY_CARE_PROVIDER_SITE_OTHER): Payer: Medicare Other

## 2015-07-17 DIAGNOSIS — D518 Other vitamin B12 deficiency anemias: Secondary | ICD-10-CM | POA: Diagnosis not present

## 2015-07-17 MED ORDER — CYANOCOBALAMIN 1000 MCG/ML IJ SOLN
1000.0000 ug | Freq: Once | INTRAMUSCULAR | Status: AC
  Administered 2015-07-17: 1000 ug via SUBCUTANEOUS

## 2015-07-30 ENCOUNTER — Ambulatory Visit (INDEPENDENT_AMBULATORY_CARE_PROVIDER_SITE_OTHER): Payer: Medicare Other | Admitting: Internal Medicine

## 2015-07-30 ENCOUNTER — Encounter: Payer: Self-pay | Admitting: Internal Medicine

## 2015-07-30 ENCOUNTER — Other Ambulatory Visit (INDEPENDENT_AMBULATORY_CARE_PROVIDER_SITE_OTHER): Payer: Medicare Other

## 2015-07-30 VITALS — BP 98/58 | HR 75 | Temp 98.0°F | Resp 14 | Ht 70.0 in | Wt 167.4 lb

## 2015-07-30 DIAGNOSIS — R197 Diarrhea, unspecified: Secondary | ICD-10-CM | POA: Diagnosis not present

## 2015-07-30 DIAGNOSIS — I2584 Coronary atherosclerosis due to calcified coronary lesion: Secondary | ICD-10-CM | POA: Diagnosis not present

## 2015-07-30 DIAGNOSIS — I2510 Atherosclerotic heart disease of native coronary artery without angina pectoris: Secondary | ICD-10-CM | POA: Diagnosis not present

## 2015-07-30 LAB — BASIC METABOLIC PANEL
BUN: 14 mg/dL (ref 6–23)
CO2: 27 mEq/L (ref 19–32)
Calcium: 8.7 mg/dL (ref 8.4–10.5)
Chloride: 108 mEq/L (ref 96–112)
Creatinine, Ser: 0.91 mg/dL (ref 0.40–1.50)
GFR: 84.93 mL/min (ref >60.00)
GLUCOSE: 111 mg/dL — AB (ref 70–99)
POTASSIUM: 3.7 meq/L (ref 3.5–5.1)
Sodium: 141 mEq/L (ref 135–145)

## 2015-07-30 LAB — CBC WITH DIFFERENTIAL/PLATELET
BASOS ABS: 0.1 10*3/uL (ref 0.0–0.1)
BASOS PCT: 0.7 % (ref 0.0–3.0)
EOS ABS: 0.9 10*3/uL — AB (ref 0.0–0.7)
Eosinophils Relative: 8.7 % — ABNORMAL HIGH (ref 0.0–5.0)
HCT: 39.5 % (ref 39.0–52.0)
Hemoglobin: 13.2 g/dL (ref 13.0–17.0)
LYMPHS PCT: 16.8 % (ref 12.0–46.0)
Lymphs Abs: 1.7 10*3/uL (ref 0.7–4.0)
MCHC: 33.5 g/dL (ref 30.0–36.0)
MCV: 95.5 fl (ref 78.0–100.0)
Monocytes Absolute: 0.9 10*3/uL (ref 0.1–1.0)
Monocytes Relative: 9.2 % (ref 3.0–12.0)
Neutro Abs: 6.5 10*3/uL (ref 1.4–7.7)
Neutrophils Relative %: 64.6 % (ref 43.0–77.0)
Platelets: 206 10*3/uL (ref 150.0–400.0)
RBC: 4.13 Mil/uL — AB (ref 4.22–5.81)
RDW: 14.4 % (ref 11.5–15.5)
WBC: 10 10*3/uL (ref 4.0–10.5)

## 2015-07-30 NOTE — Progress Notes (Signed)
Pre visit review using our clinic review tool, if applicable. No additional management support is needed unless otherwise documented below in the visit note. 

## 2015-07-30 NOTE — Patient Instructions (Signed)

## 2015-07-31 ENCOUNTER — Other Ambulatory Visit: Payer: Self-pay | Admitting: Internal Medicine

## 2015-07-31 ENCOUNTER — Other Ambulatory Visit: Payer: Medicare Other

## 2015-07-31 DIAGNOSIS — R197 Diarrhea, unspecified: Secondary | ICD-10-CM | POA: Diagnosis not present

## 2015-07-31 NOTE — Progress Notes (Signed)
Subjective:  Patient ID: Dwayne [name], male    DOB: 1932/11/23  Age: 81 y.o. MRN: [id number]  CC: Diarrhea   HPI DVANTE HANDS presents for a one-week history of diarrhea that is gradually improving. He reports about 3-4 watery bowel movements a day. He is taking loperamide and that is helping him control his symptoms. He states he occasionally has an abdominal cramp but no ongoing abdominal pain. He feels diffusely weak but his had no fever, chills, nausea, or vomiting.  Outpatient Prescriptions Prior to Visit  Medication Sig Dispense Refill  . atorvastatin (LIPITOR) 40 MG tablet Take 1 tablet (40 mg total) by mouth daily. 90 tablet 3  . Calcium-Magnesium-Vitamin D (CITRACAL CALCIUM+D) 600-40-500 MG-MG-UNIT TB24 Take 2 tablets by mouth daily.     . cyanocobalamin (,VITAMIN B-12,) 1000 MCG/ML injection Inject 1,000 mcg into the muscle every 21 ( twenty-one) days.    . Doxepin HCl (SILENOR) 6 MG TABS Take 1 tablet (6 mg total) by mouth at bedtime as needed. 30 tablet 11  . ELIQUIS 5 MG TABS tablet Take 1 tablet by mouth two  times daily 180 tablet 2  . ferrous sulfate 325 (65 FE) MG tablet Take 1 tablet (325 mg total) by mouth 2 (two) times daily with a meal. (Patient taking differently: Take 325 mg by mouth daily with breakfast. ) 180 tablet 1  . fexofenadine (ALLEGRA) 180 MG tablet Take 180 mg by mouth at bedtime as needed (for allergies).     . finasteride (PROSCAR) 5 MG tablet Take 1 tablet by mouth at  bedtime 90 tablet 3  . metoprolol tartrate (LOPRESSOR) 25 MG tablet Take 1 tablet by mouth  twice a day 60 tablet 3  . Multiple Vitamins-Minerals (OCUVITE-LUTEIN PO) Take 1 tablet by mouth daily.     . nitroGLYCERIN (NITROSTAT) 0.4 MG SL tablet Place 1 tablet (0.4 mg total) under the tongue every 5 (five) minutes x 3 doses as needed for chest pain. 25 tablet 1  . omeprazole (PRILOSEC) 40 MG capsule Take 40 mg by mouth daily.      . polyethylene glycol (MIRALAX / GLYCOLAX) packet Take 17 g  by mouth daily as needed for moderate constipation.     [name] Kitchen PROLIA 60 MG/ML SOLN injection Inject 60 mg into the skin every 6 (six) months.     . terazosin (HYTRIN) 5 MG capsule Take 1 capsule by mouth  daily 90 capsule 3  . triamcinolone cream (KENALOG) 0.1 % Apply 1 application topically as needed.  0  . HYDROcodone-homatropine (HYCODAN) 5-1.5 MG/5ML syrup Take 5 mLs by mouth every 6 (six) hours as needed for cough. (Patient not taking: Reported on 07/30/2015) 180 mL 0   No facility-administered medications prior to visit.    ROS Review of Systems  Constitutional: Negative for fever, chills, diaphoresis, appetite change and fatigue.  HENT: Negative.   Eyes: Negative.   Respiratory: Negative.  Negative for cough, choking, chest tightness, shortness of breath and stridor.   Cardiovascular: Negative.  Negative for chest pain, palpitations and leg swelling.  Gastrointestinal: Positive for diarrhea. Negative for nausea, vomiting, abdominal pain, constipation, blood in stool, anal bleeding and rectal pain.  Endocrine: Negative.  Negative for polydipsia and polyphagia.  Genitourinary: Negative.  Negative for dysuria, flank pain and difficulty urinating.  Musculoskeletal: Negative.   Skin: Negative.  Negative for rash.  Allergic/Immunologic: Negative.   Neurological: Positive for weakness. Negative for dizziness, light-headedness and headaches.  Hematological: Negative.  Negative for adenopathy.  Does not bruise/bleed easily.  Psychiatric/Behavioral: Negative.     Objective:  BP 98/58 mmHg  Pulse 75  Temp(Src) 98 F (36.7 C) (Oral)  Resp 14  Ht $Remo (1.778 m)  Wt 167 lb 6.4 oz (75.932 kg)  BMI 24.02 kg/m2  SpO2 95%  BP Readings from Last 3 Encounters:  07/30/15 98/58  06/16/15 124/80  04/24/15 132/82    Wt Readings from Last 3 Encounters:  07/30/15 167 lb 6.4 oz (75.932 kg)  06/16/15 166 lb 8 oz (75.524 kg)  04/24/15 161 lb (73.029 kg)    Physical Exam  Constitutional: He  is oriented to person, place, and time. No distress.  HENT:  Mouth/Throat: Oropharynx is clear and moist. No oropharyngeal exudate.  Eyes: Conjunctivae are normal. Right eye exhibits no discharge. Left eye exhibits no discharge. No scleral icterus.  Neck: Normal range of motion. Neck supple. No JVD present. No tracheal deviation present. No thyromegaly present.  Cardiovascular: Normal rate, regular rhythm, normal heart sounds and intact distal pulses.  Exam reveals no gallop and no friction rub.   No murmur heard. Pulmonary/Chest: Effort normal and breath sounds normal. No stridor. No respiratory distress. He has no wheezes. He has no rales. He exhibits no tenderness.  Abdominal: Soft. Bowel sounds are normal. He exhibits no distension and no mass. There is no tenderness. There is no rebound and no guarding.  Musculoskeletal: Normal range of motion. He exhibits no edema or tenderness.  Lymphadenopathy:    He has no cervical adenopathy.  Neurological: He is oriented to person, place, and time.  Skin: Skin is warm and dry. No rash noted. He is not diaphoretic. No erythema. No pallor.  Psychiatric: He has a normal mood and affect. His behavior is normal. Judgment and thought content normal.    Lab Results  Component Value Date   WBC 10.0 07/30/2015   HGB 13.2 07/30/2015   HCT 39.5 07/30/2015   PLT 206.0 07/30/2015   GLUCOSE 111* 07/30/2015   CHOL 173 03/11/2015   TRIG 103.0 03/11/2015   HDL 39.90 03/11/2015   LDLCALC 113* 03/11/2015   ALT 13 03/11/2015   AST 18 03/11/2015   NA 141 07/30/2015   K 3.7 07/30/2015   CL 108 07/30/2015   CREATININE 0.91 07/30/2015   BUN 14 07/30/2015   CO2 27 07/30/2015   TSH 1.30 03/11/2015   PSA 3.29 02/07/2015   INR 1.4* 02/21/2014   HGBA1C 5.9 03/11/2015    Dg Chest 2 View  04/12/2015   CLINICAL DATA:  Cough. Congestion and fever for 2 days. Initial encounter.  EXAM: CHEST  2 VIEW  COMPARISON:  Chest radiograph 12/07/2013.  FINDINGS:  Cardiopericardial silhouette within normal is for projection. 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 Dual lead LEFT subclavian cardiac pacemaker. Gas is interposed between the hemidiaphragms bilaterally, most compatible with colonic interposition. No convincing evidence of free air in the abdomen. Chronic elevation of the LEFT hemidiaphragm with basilar atelectasis. Thoracic kyphosis. Chronic lower thoracic compression fracture appears unchanged compared to prior exams.  No airspace disease.  No pleural effusion.  IMPRESSION: No acute cardiopulmonary disease. Chronic elevation of the LEFT hemidiaphragm and postsurgical changes of the chest.   Electronically Signed   By: Andreas Newport M.D.   On: 04/12/2015 14:13    Assessment & Plan:   Nicodemus was seen today for diarrhea.  Diagnoses and all orders for this visit:  Diarrhea- he is slightly hypotensive but overall feels well. His blood work is normal indicating  no signs of systemic infection or dehydration. I will check his stool studies to screen for C. difficile infection, Giardia, parasites and will get a lactoferrin to screen for inflammation and infection. -     CBC with Differential/Platelet; Future -     Basic metabolic panel; Future -     Clostridium difficile EIA; Future -     Giardia/cryptosporidium (EIA); Future -     Fecal lactoferrin; Future -     Ova and parasite examination; Future   I have discontinued Mr. Stettler HYDROcodone-homatropine. I am also having him maintain his fexofenadine, omeprazole, Calcium-Magnesium-Vitamin D, polyethylene glycol, PROLIA, Multiple Vitamins-Minerals (OCUVITE-LUTEIN PO), ferrous sulfate, cyanocobalamin, terazosin, finasteride, nitroGLYCERIN, triamcinolone cream, atorvastatin, metoprolol tartrate, ELIQUIS, and Doxepin HCl.  No orders of the defined types were placed in this encounter.     Follow-up: Return in about 3 weeks (around 08/20/2015).  Sanda Linger, MD

## 2015-08-01 LAB — C. DIFFICILE GDH AND TOXIN A/B
C. difficile GDH: NOT DETECTED
C. difficile Toxin A/B: NOT DETECTED

## 2015-08-01 LAB — GIARDIA/CRYPTOSPORIDIUM (EIA)
Cryptosporidium Screen (EIA): NEGATIVE
Giardia Screen (EIA): NEGATIVE

## 2015-08-01 LAB — OVA AND PARASITE EXAMINATION: OP: NONE SEEN

## 2015-08-02 LAB — FECAL LACTOFERRIN, QUANT: LACTOFERRIN: POSITIVE

## 2015-08-06 NOTE — Progress Notes (Signed)
HPI: FU CAD s/p CABG, atrial fibrillation, HTN. Per notes, he had CABG 1993 with LIMA- LAD, SVG to OM, SVG to left circumflex, SVG to PD/PLSA. He had PCI of the saphenous vein graft to his obtuse marginal in 2003. Abdominal CT in June 2011 showed no abdominal aortic aneurysm. He was hospitalized February 2015 for GIB with Hgb down to 7.6 (was 11-12 in December). He was treated with Vit K and PRBCs. Coumadin was held. Last echocardiogram April 2015 showed an ejection fraction of 50-55%, mild mitral regurgitation and trace aortic insufficiency. Apixaban later added. Since he was last seen,   Current Outpatient Prescriptions  Medication Sig Dispense Refill  . atorvastatin (LIPITOR) 40 MG tablet Take 1 tablet (40 mg total) by mouth daily. 90 tablet 3  . Calcium-Magnesium-Vitamin D (CITRACAL CALCIUM+D) 600-40-500 MG-MG-UNIT TB24 Take 2 tablets by mouth daily.     . cyanocobalamin (,VITAMIN B-12,) 1000 MCG/ML injection Inject 1,000 mcg into the muscle every 21 ( twenty-one) days.    . Doxepin HCl (SILENOR) 6 MG TABS Take 1 tablet (6 mg total) by mouth at bedtime as needed. 30 tablet 11  . ELIQUIS 5 MG TABS tablet Take 1 tablet by mouth two  times daily 180 tablet 2  . ferrous sulfate 325 (65 FE) MG tablet Take 1 tablet (325 mg total) by mouth 2 (two) times daily with a meal. (Patient taking differently: Take 325 mg by mouth daily with breakfast. ) 180 tablet 1  . fexofenadine (ALLEGRA) 180 MG tablet Take 180 mg by mouth at bedtime as needed (for allergies).     . finasteride (PROSCAR) 5 MG tablet Take 1 tablet by mouth at  bedtime 90 tablet 3  . metoprolol tartrate (LOPRESSOR) 25 MG tablet Take 1 tablet by mouth  twice a day 60 tablet 3  . Multiple Vitamins-Minerals (OCUVITE-LUTEIN PO) Take 1 tablet by mouth daily.     . nitroGLYCERIN (NITROSTAT) 0.4 MG SL tablet Place 1 tablet (0.4 mg total) under the tongue every 5 (five) minutes x 3 doses as needed for chest pain. 25 tablet 1  . omeprazole  (PRILOSEC) 40 MG capsule Take 40 mg by mouth daily.      . polyethylene glycol (MIRALAX / GLYCOLAX) packet Take 17 g by mouth daily as needed for moderate constipation.     [name] Kitchen PROLIA 60 MG/ML SOLN injection Inject 60 mg into the skin every 6 (six) months.     . terazosin (HYTRIN) 5 MG capsule Take 1 capsule by mouth  daily 90 capsule 3  . triamcinolone cream (KENALOG) 0.1 % Apply 1 application topically as needed.  0   No current facility-administered medications for this visit.     Past Medical History  Diagnosis Date  . Atrial fibrillation     a. Was on Coumadin until GIB 01/2014.  [name] Kitchen Hyperlipidemia   . Bronchiectasis without acute exacerbation   . Disorders of diaphragm   . Plantar fascial fibromatosis   . Epistaxis   . Unspecified sinusitis (chronic)   . Allergic rhinitis, cause unspecified   . Spinal stenosis, unspecified region other than cervical   . Hypertension   . Coronary artery disease     a. s/p CABG 1993. b. s/p PCI of SVG to OM '03.  . Tachy-brady syndrome     s/p Medtronic PPM '07  . Myocardial infarction   . GERD (gastroesophageal reflux disease)   . Arthritis   . H/O: GI bleed  a. 01/2014 - presumed diverticular. Received PRBC, Vit K. Coumadin put on hold.  . Diverticulosis   . LV dysfunction     a. Echo 08/2013: EF 45-50%.    Past Surgical History  Procedure Laterality Date  . Coronary artery bypass graft      LIMA to LAD, SVG to OM, SVG to left circumflex, SVG to PD/PLSA  . Pacemaker insertion      2007, Medtronic dual-chamber  . Kidney cyst removal    . Doppler echocardiography  2011  . Cholecystectomy    . Vertebroplasty    . Cataract extraction w/ intraocular lens  implant, bilateral    . Hip arthroplasty Right 12/10/2013    Procedure: Right Hip Cemented Hemiarthroplasty;  Surgeon: Eldred Manges, MD;  Location: [facility] OR;  Service: Orthopedics;  Laterality: Right;  Right Hip Cemented Hemiarthroplasty  . Colonoscopy    . Permanent pacemaker generator  change N/A 08/14/2014    Procedure: PERMANENT PACEMAKER GENERATOR CHANGE;  Surgeon: Marinus Maw, MD;  Location: [facility] CATH LAB;  Service: Cardiovascular;  Laterality: N/A;    Social History   Social History  . Marital Status: Married    Spouse Name: Ollen Gross  . Number of Children: 4  . Years of Education: N/A   Occupational History  .     Social History Main Topics  . Smoking status: Former Games developer  . Smokeless tobacco: Never Used     Comment: x 6 months in high school  . Alcohol Use: No     Comment: none  . Drug Use: No  . Sexual Activity: No   Other Topics Concern  . Not on file   Social History Narrative   Married.  Ambulates with a walker.    ROS: no fevers or chills, productive cough, hemoptysis, dysphasia, odynophagia, melena, hematochezia, dysuria, hematuria, rash, seizure activity, orthopnea, PND, pedal edema, claudication. Remaining systems are negative.  Physical Exam: Well-developed well-nourished in no acute distress.  Skin is warm and dry.  HEENT is normal.  Neck is supple.  Chest is clear to auscultation with normal expansion.  Cardiovascular exam is regular rate and rhythm.  Abdominal exam nontender or distended. No masses palpated. Extremities show no edema. neuro grossly intact  ECG     This encounter was created in error - please disregard.

## 2015-08-07 ENCOUNTER — Telehealth: Payer: Self-pay | Admitting: Emergency Medicine

## 2015-08-07 ENCOUNTER — Ambulatory Visit (INDEPENDENT_AMBULATORY_CARE_PROVIDER_SITE_OTHER): Payer: Medicare Other | Admitting: Emergency Medicine

## 2015-08-07 DIAGNOSIS — E538 Deficiency of other specified B group vitamins: Secondary | ICD-10-CM | POA: Diagnosis not present

## 2015-08-07 DIAGNOSIS — D519 Vitamin B12 deficiency anemia, unspecified: Secondary | ICD-10-CM

## 2015-08-07 MED ORDER — CYANOCOBALAMIN 1000 MCG/ML IJ SOLN
1000.0000 ug | Freq: Once | INTRAMUSCULAR | Status: AC
  Administered 2015-08-07: 1000 ug via INTRAMUSCULAR

## 2015-08-07 NOTE — Telephone Encounter (Signed)
Pt came in today for B12 injection, pts wife asked about the resent lab work done on 07/31/15. I looked in labs and it had not been released yet. Please advise and let pt know.

## 2015-08-07 NOTE — Telephone Encounter (Signed)
His labs were okay, how is his diarrhea?

## 2015-08-07 NOTE — Telephone Encounter (Signed)
Please advise and I will call pt

## 2015-08-07 NOTE — Telephone Encounter (Signed)
Pt informed. Diarrhea is gone.

## 2015-08-08 ENCOUNTER — Encounter: Payer: Medicare Other | Admitting: Cardiology

## 2015-08-14 NOTE — Progress Notes (Signed)
HPI: FU CAD s/p CABG, atrial fibrillation, HTN. Per notes, he had CABG 1993 with LIMA- LAD, SVG to OM, SVG to left circumflex, SVG to PD/PLSA. He had PCI of the saphenous vein graft to his obtuse marginal in 2003. Abdominal CT in June 2011 showed no abdominal aortic aneurysm. He was hospitalized February 2015 for GIB with Hgb down to 7.6 (was 11-12 in December). He was treated with Vit K and PRBCs. Coumadin was held. Last echocardiogram April 2015 showed an ejection fraction of 50-55%, mild mitral regurgitation and trace aortic insufficiency. Now on apixaban. Since he was last seen, the patient denies any dyspnea on exertion, orthopnea, PND, pedal edema, palpitations, syncope or chest pain.   Current Outpatient Prescriptions  Medication Sig Dispense Refill  . atorvastatin (LIPITOR) 40 MG tablet Take 1 tablet (40 mg total) by mouth daily. 90 tablet 3  . Calcium-Magnesium-Vitamin D (CITRACAL CALCIUM+D) 600-40-500 MG-MG-UNIT TB24 Take 2 tablets by mouth daily.     . cyanocobalamin (,VITAMIN B-12,) 1000 MCG/ML injection Inject 1,000 mcg into the muscle every 21 ( twenty-one) days.    . Doxepin HCl (SILENOR) 6 MG TABS Take 1 tablet (6 mg total) by mouth at bedtime as needed. 30 tablet 11  . ELIQUIS 5 MG TABS tablet Take 1 tablet by mouth two  times daily 180 tablet 2  . ferrous sulfate 325 (65 FE) MG tablet Take 1 tablet (325 mg total) by mouth 2 (two) times daily with a meal. (Patient taking differently: Take 325 mg by mouth daily with breakfast. ) 180 tablet 1  . fexofenadine (ALLEGRA) 180 MG tablet Take 180 mg by mouth at bedtime as needed (for allergies).     . finasteride (PROSCAR) 5 MG tablet Take 1 tablet by mouth at  bedtime 90 tablet 3  . metoprolol tartrate (LOPRESSOR) 25 MG tablet Take 1 tablet by mouth  twice a day 60 tablet 3  . Multiple Vitamins-Minerals (OCUVITE-LUTEIN PO) Take 1 tablet by mouth daily.     . nitroGLYCERIN (NITROSTAT) 0.4 MG SL tablet Place 1 tablet (0.4 mg total)  under the tongue every 5 (five) minutes x 3 doses as needed for chest pain. 25 tablet 1  . omeprazole (PRILOSEC) 40 MG capsule Take 40 mg by mouth daily.      . polyethylene glycol (MIRALAX / GLYCOLAX) packet Take 17 g by mouth daily as needed for moderate constipation.     [name] Kitchen PROLIA 60 MG/ML SOLN injection Inject 60 mg into the skin every 6 (six) months.     . terazosin (HYTRIN) 5 MG capsule Take 1 capsule by mouth  daily 90 capsule 3  . triamcinolone cream (KENALOG) 0.1 % Apply 1 application topically as needed.  0   No current facility-administered medications for this visit.     Past Medical History  Diagnosis Date  . Atrial fibrillation     a. Was on Coumadin until GIB 01/2014.  [name] Kitchen Hyperlipidemia   . Bronchiectasis without acute exacerbation   . Disorders of diaphragm   . Plantar fascial fibromatosis   . Epistaxis   . Unspecified sinusitis (chronic)   . Allergic rhinitis, cause unspecified   . Spinal stenosis, unspecified region other than cervical   . Hypertension   . Coronary artery disease     a. s/p CABG 1993. b. s/p PCI of SVG to OM '03.  . Tachy-brady syndrome     s/p Medtronic PPM '07  . Myocardial infarction   . GERD (gastroesophageal  reflux disease)   . Arthritis   . H/O: GI bleed     a. 01/2014 - presumed diverticular. Received PRBC, Vit K. Coumadin put on hold.  . Diverticulosis   . LV dysfunction     a. Echo 08/2013: EF 45-50%.    Past Surgical History  Procedure Laterality Date  . Coronary artery bypass graft      LIMA to LAD, SVG to OM, SVG to left circumflex, SVG to PD/PLSA  . Pacemaker insertion      2007, Medtronic dual-chamber  . Kidney cyst removal    . Doppler echocardiography  2011  . Cholecystectomy    . Vertebroplasty    . Cataract extraction w/ intraocular lens  implant, bilateral    . Hip arthroplasty Right 12/10/2013    Procedure: Right Hip Cemented Hemiarthroplasty;  Surgeon: Eldred Manges, MD;  Location: [facility] OR;  Service: Orthopedics;   Laterality: Right;  Right Hip Cemented Hemiarthroplasty  . Colonoscopy    . Permanent pacemaker generator change N/A 08/14/2014    Procedure: PERMANENT PACEMAKER GENERATOR CHANGE;  Surgeon: Marinus Maw, MD;  Location: [facility] CATH LAB;  Service: Cardiovascular;  Laterality: N/A;    Social History   Social History  . Marital Status: Married    Spouse Name: Ollen Gross  . Number of Children: 4  . Years of Education: N/A   Occupational History  .     Social History Main Topics  . Smoking status: Former Games developer  . Smokeless tobacco: Never Used     Comment: x 6 months in high school  . Alcohol Use: No     Comment: none  . Drug Use: No  . Sexual Activity: No   Other Topics Concern  . Not on file   Social History Narrative   Married.  Ambulates with a walker.    ROS: no fevers or chills, productive cough, hemoptysis, dysphasia, odynophagia, melena, hematochezia, dysuria, hematuria, rash, seizure activity, orthopnea, PND, pedal edema, claudication. Remaining systems are negative.  Physical Exam: Well-developed well-nourished in no acute distress.  Skin is warm and dry.  HEENT is normal.  Neck is supple.  Chest is clear to auscultation with normal expansion.  Cardiovascular exam is regular rate and rhythm.  Abdominal exam nontender or distended. No masses palpated. Extremities show trace edema. neuro grossly intact  ECG ventricular pacing with probable underlying atrial fibrillation.

## 2015-08-19 ENCOUNTER — Ambulatory Visit (INDEPENDENT_AMBULATORY_CARE_PROVIDER_SITE_OTHER): Payer: Medicare Other | Admitting: Internal Medicine

## 2015-08-19 ENCOUNTER — Encounter: Payer: Self-pay | Admitting: Cardiology

## 2015-08-19 ENCOUNTER — Ambulatory Visit (INDEPENDENT_AMBULATORY_CARE_PROVIDER_SITE_OTHER): Payer: Medicare Other | Admitting: Cardiology

## 2015-08-19 ENCOUNTER — Encounter: Payer: Self-pay | Admitting: Internal Medicine

## 2015-08-19 VITALS — BP 126/78 | HR 84 | Ht 70.0 in | Wt 166.0 lb

## 2015-08-19 VITALS — BP 130/74 | HR 62 | Ht 70.0 in | Wt 165.6 lb

## 2015-08-19 DIAGNOSIS — I482 Chronic atrial fibrillation, unspecified: Secondary | ICD-10-CM

## 2015-08-19 DIAGNOSIS — I2510 Atherosclerotic heart disease of native coronary artery without angina pectoris: Secondary | ICD-10-CM

## 2015-08-19 DIAGNOSIS — I25700 Atherosclerosis of coronary artery bypass graft(s), unspecified, with unstable angina pectoris: Secondary | ICD-10-CM

## 2015-08-19 DIAGNOSIS — Z950 Presence of cardiac pacemaker: Secondary | ICD-10-CM | POA: Diagnosis not present

## 2015-08-19 DIAGNOSIS — I2584 Coronary atherosclerosis due to calcified coronary lesion: Secondary | ICD-10-CM

## 2015-08-19 DIAGNOSIS — E785 Hyperlipidemia, unspecified: Secondary | ICD-10-CM

## 2015-08-19 DIAGNOSIS — I10 Essential (primary) hypertension: Secondary | ICD-10-CM

## 2015-08-19 LAB — CUP PACEART INCLINIC DEVICE CHECK
Battery Impedance: 128 Ohm
Battery Voltage: 2.79 V
Brady Statistic RV Percent Paced: 97 %
Date Time Interrogation Session: 20160830135305
Lead Channel Impedance Value: 484 Ohm
Lead Channel Impedance Value: 67 Ohm
Lead Channel Setting Pacing Amplitude: 2 V
Lead Channel Setting Sensing Sensitivity: 4 mV
MDC IDC MSMT BATTERY REMAINING LONGEVITY: 135 mo
MDC IDC MSMT LEADCHNL RV PACING THRESHOLD AMPLITUDE: 1 V
MDC IDC MSMT LEADCHNL RV PACING THRESHOLD PULSEWIDTH: 0.4 ms
MDC IDC SET LEADCHNL RV PACING PULSEWIDTH: 0.4 ms

## 2015-08-19 NOTE — Assessment & Plan Note (Signed)
He denies anginal symptoms. Will follow. 

## 2015-08-19 NOTE — Assessment & Plan Note (Signed)
Blood pressure controlled. Continue present medications. 

## 2015-08-19 NOTE — Assessment & Plan Note (Signed)
Followed by electrophysiology. 

## 2015-08-19 NOTE — Assessment & Plan Note (Signed)
Continue beta blocker and apixaban; check hemoglobin and renal function every 6 months.

## 2015-08-19 NOTE — Patient Instructions (Signed)
Medication Instructions:  Your physician recommends that you continue on your current medications as directed. Please refer to the Current Medication list given to you today.   Labwork: None ordered  Testing/Procedures: None ordered  Follow-Up: Your physician wants you to follow-up in: 12 months with Dr Court Joy will receive a reminder letter in the mail two months in advance. If you don't receive a letter, please call our office to schedule the follow-up appointment.  Remote monitoring is used to monitor your Pacemaker  from home. This monitoring reduces the number of office visits required to check your device to one time per year. It allows [place] to keep an eye on the functioning of your device to ensure it is working properly. You are scheduled for a device check from home on 11/18/15. You may send your transmission at any time that day. If you have a wireless device, the transmission will be sent automatically. After your physician reviews your transmission, you will receive a postcard with your next transmission date.    Any Other Special Instructions Will Be Listed Below (If Applicable).

## 2015-08-19 NOTE — Patient Instructions (Signed)
Your physician wants you to follow-up in: 6 MONTHS WITH DR CRENSHAW You will receive a reminder letter in the mail two months in advance. If you don't receive a letter, please call our office to schedule the follow-up appointment.  

## 2015-08-19 NOTE — Assessment & Plan Note (Signed)
Continue statin. Not on aspirin given need for anticoagulation. 

## 2015-08-19 NOTE — Assessment & Plan Note (Signed)
He is chronically out of rhythm. His ventricular rate is well controlled. Will follow.

## 2015-08-19 NOTE — Assessment & Plan Note (Signed)
Continue statin. 

## 2015-08-19 NOTE — Assessment & Plan Note (Signed)
His medtronic single chamber PPM is working normally. Will recheck in several months.  

## 2015-08-19 NOTE — Progress Notes (Signed)
HPI [name] returns today for followup. He is a very pleasant 81 year old man with a history of chronic atrial fibrillation and complete heart block status post permanent pacemaker insertion. In the interim, he has been stable. He has undergone generator change out on his pacemaker. He denies syncope, chest pain, shortness of breath, or peripheral edema. He admits to some daytime somnolence if he takes his sleeping medication.  Allergies  Allergen Reactions  . Asparaginase Derivatives Other (See Comments)    Makes jittery & causes vision problems, headaches  . Doxycycline Hyclate Diarrhea and Nausea And Vomiting  . Erythromycin Diarrhea and Nausea And Vomiting  . Tetracycline Diarrhea and Nausea And Vomiting  . Ambien [Zolpidem Tartrate] Other (See Comments)    unknown  . Caffeine Other (See Comments)    Interrupts sleep  . Clindamycin Other (See Comments)    REACTION: upset stomach  . Lactose Intolerance (Gi) Other (See Comments)    unknown  . Lyrica [Pregabalin] Other (See Comments)    unknown  . Metronidazole Itching and Rash  . Nitrostat [Nitroglycerin] Diarrhea  . Penicillins Hives  . Sulfonamide Derivatives Hives     Current Outpatient Prescriptions  Medication Sig Dispense Refill  . atorvastatin (LIPITOR) 40 MG tablet Take 1 tablet (40 mg total) by mouth daily. 90 tablet 3  . Calcium-Magnesium-Vitamin D (CITRACAL CALCIUM+D) 600-40-500 MG-MG-UNIT TB24 Take 2 tablets by mouth daily.     . cyanocobalamin (,VITAMIN B-12,) 1000 MCG/ML injection Inject 1,000 mcg into the muscle every 21 ( twenty-one) days.    . Doxepin HCl (SILENOR) 6 MG TABS Take 1 tablet (6 mg total) by mouth at bedtime as needed. 30 tablet 11  . ELIQUIS 5 MG TABS tablet Take 1 tablet by mouth two  times daily 180 tablet 2  . ferrous sulfate 325 (65 FE) MG tablet Take 1 tablet (325 mg total) by mouth 2 (two) times daily with a meal. (Patient taking differently: Take 325 mg by mouth daily with breakfast. ) 180  tablet 1  . fexofenadine (ALLEGRA) 180 MG tablet Take 180 mg by mouth at bedtime as needed (for allergies).     . finasteride (PROSCAR) 5 MG tablet Take 1 tablet by mouth at  bedtime 90 tablet 3  . metoprolol tartrate (LOPRESSOR) 25 MG tablet Take 1 tablet by mouth  twice a day 60 tablet 3  . Multiple Vitamins-Minerals (OCUVITE-LUTEIN PO) Take 1 tablet by mouth daily.     . nitroGLYCERIN (NITROSTAT) 0.4 MG SL tablet Place 1 tablet (0.4 mg total) under the tongue every 5 (five) minutes x 3 doses as needed for chest pain. 25 tablet 1  . omeprazole (PRILOSEC) 40 MG capsule Take 40 mg by mouth daily.      . polyethylene glycol (MIRALAX / GLYCOLAX) packet Take 17 g by mouth daily as needed for moderate constipation.     [name] Kitchen PROLIA 60 MG/ML SOLN injection Inject 60 mg into the skin every 6 (six) months.     . terazosin (HYTRIN) 5 MG capsule Take 1 capsule by mouth  daily 90 capsule 3  . triamcinolone cream (KENALOG) 0.1 % Apply 1 application topically as needed.  0   No current facility-administered medications for this visit.     Past Medical History  Diagnosis Date  . Atrial fibrillation     a. Was on Coumadin until GIB 01/2014.  [name] Kitchen Hyperlipidemia   . Bronchiectasis without acute exacerbation   . Disorders of diaphragm   . Plantar fascial fibromatosis   .  Epistaxis   . Unspecified sinusitis (chronic)   . Allergic rhinitis, cause unspecified   . Spinal stenosis, unspecified region other than cervical   . Hypertension   . Coronary artery disease     a. s/p CABG 1993. b. s/p PCI of SVG to OM '03.  . Tachy-brady syndrome     s/p Medtronic PPM '07  . Myocardial infarction   . GERD (gastroesophageal reflux disease)   . Arthritis   . H/O: GI bleed     a. 01/2014 - presumed diverticular. Received PRBC, Vit K. Coumadin put on hold.  . Diverticulosis   . LV dysfunction     a. Echo 08/2013: EF 45-50%.    ROS:   All systems reviewed and negative except as noted in the HPI.   Past Surgical  History  Procedure Laterality Date  . Coronary artery bypass graft      LIMA to LAD, SVG to OM, SVG to left circumflex, SVG to PD/PLSA  . Pacemaker insertion      2007, Medtronic dual-chamber  . Kidney cyst removal    . Doppler echocardiography  2011  . Cholecystectomy    . Vertebroplasty    . Cataract extraction w/ intraocular lens  implant, bilateral    . Hip arthroplasty Right 12/10/2013    Procedure: Right Hip Cemented Hemiarthroplasty;  Surgeon: Eldred Manges, MD;  Location: [facility] OR;  Service: Orthopedics;  Laterality: Right;  Right Hip Cemented Hemiarthroplasty  . Colonoscopy    . Permanent pacemaker generator change N/A 08/14/2014    Procedure: PERMANENT PACEMAKER GENERATOR CHANGE;  Surgeon: Marinus Maw, MD;  Location: [facility] CATH LAB;  Service: Cardiovascular;  Laterality: N/A;     Family History  Problem Relation Age of Onset  . Colon cancer Neg Hx   . Heart disease Neg Hx   . Stroke Mother   . Throat cancer Neg Hx   . Pancreatic cancer Neg Hx   . Diabetes Son   . Kidney disease Neg Hx   . Liver disease Neg Hx   . Osteoporosis Neg Hx      Social History   Social History  . Marital Status: Married    Spouse Name: Dwayne [name]  . Number of Children: 4  . Years of Education: N/A   Occupational History  .     Social History Main Topics  . Smoking status: Former Games developer  . Smokeless tobacco: Never Used     Comment: x 6 months in high school  . Alcohol Use: No     Comment: none  . Drug Use: No  . Sexual Activity: No   Other Topics Concern  . Not on file   Social History Narrative   Married.  Ambulates with a walker.     BP 126/78 mmHg  Pulse 84  Ht  (1.778 m)  Wt 166 lb (75.297 kg)  BMI 23.82 kg/m2  Physical Exam:  Frail but stable appearing 81 year old man,NAD HEENT: Unremarkable Neck:  7 cm JVD, no thyromegally Back:  No CVA tenderness Lungs:  Clear with no wheezes, rales, or rhonchi. No increased work of breathing. Well healed PPM  incision. HEART:  Regular rate rhythm, no murmurs, no rubs, no clicks Abd:  soft, positive bowel sounds, no organomegally, no rebound, no guarding Ext:  2 plus pulses, no edema, no cyanosis, no clubbing Skin:  No rashes no nodules Neuro:  CN II through XII intact, motor grossly intact   DEVICE  Normal device function.  See  PaceArt for details.   Assess/Plan:

## 2015-08-28 ENCOUNTER — Ambulatory Visit (INDEPENDENT_AMBULATORY_CARE_PROVIDER_SITE_OTHER): Payer: Medicare Other

## 2015-08-28 DIAGNOSIS — D649 Anemia, unspecified: Secondary | ICD-10-CM

## 2015-08-28 MED ORDER — CYANOCOBALAMIN 1000 MCG/ML IJ SOLN
1000.0000 ug | Freq: Once | INTRAMUSCULAR | Status: AC
  Administered 2015-08-28: 1000 ug via INTRAMUSCULAR

## 2015-09-01 ENCOUNTER — Telehealth: Payer: Self-pay | Admitting: Internal Medicine

## 2015-09-01 DIAGNOSIS — Z23 Encounter for immunization: Secondary | ICD-10-CM | POA: Diagnosis not present

## 2015-09-01 NOTE — Telephone Encounter (Signed)
Patient is due his Prolia injection Nov. 12th or later so I have electronically submitted his info for [employer] verification and will notify you once I have a response. Thank you.

## 2015-09-08 ENCOUNTER — Telehealth: Payer: Self-pay | Admitting: Cardiology

## 2015-09-08 NOTE — Telephone Encounter (Signed)
Pt says he wants something that is equalivant to Eliquis,it is too expensive.He does not want them to keep coming,he wants them to be stopped.

## 2015-09-08 NOTE — Telephone Encounter (Signed)
Called patient regarding his eliquis. He states he cannot afford this medication. He has taken warfarin in the past and is "didn't work too well". Explained that we can see if his insurance will cover or prefers another NOAC and that this could be addressed by Dr. Dolores Hoose. He reports Eliquis costs him over $400. He began yelling at me stating he used to work in [employer] and he knows there are other options. Explained the medications available - xarelto, savasya, pradaxa, warfarin. Explained that it would be helpful to know if his insurance has a preferred anticoagulant on their drug list. Patient persisted to yell saying that eliquis is a new medication and they are trying to push this medication on patients. The conversation was very one sided with patient yelling at nurse regarding this medication issue - conversation lasting 12 minutes. Reiterated to patient that MD or pharmacist can change his NOAC and offered samples of Eliquis as he was made aware that stopping his anticoagulant is not safe. He voiced understanding of this and thanked RN for listening to his "squawk" over his eliquis issue.   Message routed to Dr. Dolores Hoose to advise on Rx for different NOAC

## 2015-09-08 NOTE — Telephone Encounter (Signed)
I have rec'd Mr. Heady insurance verification for Prolia.  He will have an estimated responsibility of $0.  Please make him aware this is an estimate and we will not know an exact amt until both insurances have paid. Remember is injection is not due until November 12th or later.   I have sent a copy of the summary of benefits to be scanned into his chart.    Once he rec's his injection please let me know the injection date so that I can update the Prolia portal.  If you have any questions, please let me know.  Thank you.

## 2015-09-08 NOTE — Telephone Encounter (Signed)
Please contact patient and check to see which NOAC would be least expensive. Dwayne [name]

## 2015-09-09 NOTE — Telephone Encounter (Signed)
Spoke with pt, aware prior authorization for eliquis complete but it does not change the cost to the patient. Placed the information for patient assistance for the eliquis placed at the front desk for pt pick up. Will need to try for tier except if not eligible for assistance.

## 2015-09-11 NOTE — Telephone Encounter (Signed)
Left patient vm to call back to schedule.  °

## 2015-09-22 ENCOUNTER — Other Ambulatory Visit: Payer: Self-pay | Admitting: *Deleted

## 2015-09-22 ENCOUNTER — Telehealth: Payer: Self-pay | Admitting: *Deleted

## 2015-09-22 MED ORDER — APIXABAN 5 MG PO TABS: ORAL_TABLET | ORAL | Status: DC

## 2015-09-22 NOTE — Telephone Encounter (Signed)
Pt assistance application for eliquis faxed to the company

## 2015-09-24 DIAGNOSIS — N138 Other obstructive and reflux uropathy: Secondary | ICD-10-CM | POA: Diagnosis not present

## 2015-09-24 DIAGNOSIS — N401 Enlarged prostate with lower urinary tract symptoms: Secondary | ICD-10-CM | POA: Diagnosis not present

## 2015-09-24 DIAGNOSIS — R8271 Bacteriuria: Secondary | ICD-10-CM | POA: Diagnosis not present

## 2015-09-30 ENCOUNTER — Other Ambulatory Visit: Payer: Self-pay | Admitting: Cardiology

## 2015-09-30 ENCOUNTER — Ambulatory Visit (INDEPENDENT_AMBULATORY_CARE_PROVIDER_SITE_OTHER): Payer: Medicare Other | Admitting: Internal Medicine

## 2015-09-30 ENCOUNTER — Encounter: Payer: Self-pay | Admitting: Internal Medicine

## 2015-09-30 VITALS — BP 130/80 | HR 72 | Temp 97.7°F | Resp 16 | Ht 70.0 in | Wt 169.0 lb

## 2015-09-30 DIAGNOSIS — I2510 Atherosclerotic heart disease of native coronary artery without angina pectoris: Secondary | ICD-10-CM | POA: Diagnosis not present

## 2015-09-30 DIAGNOSIS — I2584 Coronary atherosclerosis due to calcified coronary lesion: Secondary | ICD-10-CM

## 2015-09-30 DIAGNOSIS — I10 Essential (primary) hypertension: Secondary | ICD-10-CM | POA: Diagnosis not present

## 2015-09-30 NOTE — Progress Notes (Signed)
Pre visit review using our clinic review tool, if applicable. No additional management support is needed unless otherwise documented below in the visit note. 

## 2015-09-30 NOTE — Telephone Encounter (Signed)
REFILL 

## 2015-09-30 NOTE — Patient Instructions (Signed)
Hypertension Hypertension, commonly called high blood pressure, is when the force of blood pumping through your arteries is too strong. Your arteries are the blood vessels that carry blood from your heart throughout your body. A blood pressure reading consists of a higher number over a lower number, such as 110/72. The higher number (systolic) is the pressure inside your arteries when your heart pumps. The lower number (diastolic) is the pressure inside your arteries when your heart relaxes. Ideally you want your blood pressure below 120/80. Hypertension forces your heart to work harder to pump blood. Your arteries may become narrow or stiff. Having untreated or uncontrolled hypertension can cause heart attack, stroke, kidney disease, and other problems. RISK FACTORS Some risk factors for high blood pressure are controllable. Others are not.  Risk factors you cannot control include:   Race. You may be at higher risk if you are African American.  Age. Risk increases with age.  Gender. Men are at higher risk than women before age 45 years. After age 65, women are at higher risk than men. Risk factors you can control include:  Not getting enough exercise or physical activity.  Being overweight.  Getting too much fat, sugar, calories, or salt in your diet.  Drinking too much alcohol. SIGNS AND SYMPTOMS Hypertension does not usually cause signs or symptoms. Extremely high blood pressure (hypertensive crisis) may cause headache, anxiety, shortness of breath, and nosebleed. DIAGNOSIS To check if you have hypertension, your health care provider will measure your blood pressure while you are seated, with your arm held at the level of your heart. It should be measured at least twice using the same arm. Certain conditions can cause a difference in blood pressure between your right and left arms. A blood pressure reading that is higher than normal on one occasion does not mean that you need treatment. If  it is not clear whether you have high blood pressure, you may be asked to return on a different day to have your blood pressure checked again. Or, you may be asked to monitor your blood pressure at home for 1 or more weeks. TREATMENT Treating high blood pressure includes making lifestyle changes and possibly taking medicine. Living a healthy lifestyle can help lower high blood pressure. You may need to change some of your habits. Lifestyle changes may include:  Following the DASH diet. This diet is high in fruits, vegetables, and whole grains. It is low in salt, red meat, and added sugars.  Keep your sodium intake below 2,300 mg per day.  Getting at least 30-45 minutes of aerobic exercise at least 4 times per week.  Losing weight if necessary.  Not smoking.  Limiting alcoholic beverages.  Learning ways to reduce stress. Your health care provider may prescribe medicine if lifestyle changes are not enough to get your blood pressure under control, and if one of the following is true:  You are 18-59 years of age and your systolic blood pressure is above 140.  You are 60 years of age or older, and your systolic blood pressure is above 150.  Your diastolic blood pressure is above 90.  You have diabetes, and your systolic blood pressure is over 140 or your diastolic blood pressure is over 90.  You have kidney disease and your blood pressure is above 140/90.  You have heart disease and your blood pressure is above 140/90. Your personal target blood pressure may vary depending on your medical conditions, your age, and other factors. HOME CARE INSTRUCTIONS    Have your blood pressure rechecked as directed by your health care provider.   Take medicines only as directed by your health care provider. Follow the directions carefully. Blood pressure medicines must be taken as prescribed. The medicine does not work as well when you skip doses. Skipping doses also puts you at risk for  problems.  Do not smoke.   Monitor your blood pressure at home as directed by your health care provider. SEEK MEDICAL CARE IF:   You think you are having a reaction to medicines taken.  You have recurrent headaches or feel dizzy.  You have swelling in your ankles.  You have trouble with your vision. SEEK IMMEDIATE MEDICAL CARE IF:  You develop a severe headache or confusion.  You have unusual weakness, numbness, or feel faint.  You have severe chest or abdominal pain.  You vomit repeatedly.  You have trouble breathing. MAKE SURE YOU:   Understand these instructions.  Will watch your condition.  Will get help right away if you are not doing well or get worse.   This information is not intended to replace advice given to you by your health care provider. Make sure you discuss any questions you have with your health care provider.   Document Released: 12/06/2005 Document Revised: 04/22/2015 Document Reviewed: 09/28/2013 Elsevier Interactive Patient Education 2016 Elsevier Inc.  

## 2015-09-30 NOTE — Progress Notes (Signed)
Subjective:  Patient ID: Dwayne [name], male    DOB: [birth year]  Age: 81 y.o. MRN: [id number]  CC: Hypertension   HPI Dwayne [name] presents for follow up after a recent episode of diarrhea - he tells me that he has 1-2 BM's per day and they are normal by his definition. He feels well today and offers no complaints.  Outpatient Prescriptions Prior to Visit  Medication Sig Dispense Refill  . apixaban (ELIQUIS) 5 MG TABS tablet Take 1 tablet by mouth two  times daily 180 tablet 2  . atorvastatin (LIPITOR) 40 MG tablet Take 1 tablet (40 mg total) by mouth daily. 90 tablet 3  . Calcium-Magnesium-Vitamin D (CITRACAL CALCIUM+D) 600-40-500 MG-MG-UNIT TB24 Take 2 tablets by mouth daily.     . cyanocobalamin (,VITAMIN B-12,) 1000 MCG/ML injection Inject 1,000 mcg into the muscle every 21 ( twenty-one) days.    . Doxepin HCl (SILENOR) 6 MG TABS Take 1 tablet (6 mg total) by mouth at bedtime as needed. 30 tablet 11  . ferrous sulfate 325 (65 FE) MG tablet Take 1 tablet (325 mg total) by mouth 2 (two) times daily with a meal. (Patient taking differently: Take 325 mg by mouth daily with breakfast. ) 180 tablet 1  . fexofenadine (ALLEGRA) 180 MG tablet Take 180 mg by mouth at bedtime as needed (for allergies).     . finasteride (PROSCAR) 5 MG tablet Take 1 tablet by mouth at  bedtime 90 tablet 3  . metoprolol tartrate (LOPRESSOR) 25 MG tablet Take 1 tablet by mouth  twice a day 60 tablet 3  . Multiple Vitamins-Minerals (OCUVITE-LUTEIN PO) Take 1 tablet by mouth daily.     . nitroGLYCERIN (NITROSTAT) 0.4 MG SL tablet Place 1 tablet (0.4 mg total) under the tongue every 5 (five) minutes x 3 doses as needed for chest pain. 25 tablet 1  . omeprazole (PRILOSEC) 40 MG capsule Take 40 mg by mouth daily.      . polyethylene glycol (MIRALAX / GLYCOLAX) packet Take 17 g by mouth daily as needed for moderate constipation.     [name] Kitchen PROLIA 60 MG/ML SOLN injection Inject 60 mg into the skin every 6 (six) months.     .  terazosin (HYTRIN) 5 MG capsule Take 1 capsule by mouth  daily 90 capsule 3  . triamcinolone cream (KENALOG) 0.1 % Apply 1 application topically as needed.  0   No facility-administered medications prior to visit.    ROS Review of Systems  Constitutional: Negative.  Negative for fever, chills, diaphoresis, appetite change and fatigue.  HENT: Negative.  Negative for rhinorrhea, sore throat and trouble swallowing.   Eyes: Negative.   Respiratory: Negative.  Negative for cough, chest tightness, shortness of breath and stridor.   Cardiovascular: Negative.  Negative for chest pain, palpitations and leg swelling.  Gastrointestinal: Negative.  Negative for nausea, vomiting, abdominal pain, diarrhea, constipation and blood in stool.  Endocrine: Negative.   Genitourinary: Negative.   Musculoskeletal: Negative.   Skin: Negative.  Negative for rash.  Allergic/Immunologic: Negative.   Neurological: Negative.   Hematological: Negative.  Negative for adenopathy. Does not bruise/bleed easily.  Psychiatric/Behavioral: Positive for sleep disturbance. Negative for suicidal ideas, dysphoric mood, decreased concentration and agitation. The patient is not nervous/anxious.     Objective:  BP 130/80 mmHg  Pulse 72  Temp(Src) 97.7 F (36.5 C) (Oral)  Resp 16  Ht  (1.778 m)  Wt 169 lb (76.658 kg)  BMI 24.25 kg/m2  SpO2 95%  BP Readings from Last 3 Encounters:  09/30/15 130/80  08/19/15 130/74  08/19/15 126/78    Wt Readings from Last 3 Encounters:  09/30/15 169 lb (76.658 kg)  08/19/15 165 lb 9.6 oz (75.116 kg)  08/19/15 166 lb (75.297 kg)    Physical Exam  Constitutional: He is oriented to person, place, and time.  Non-toxic appearance. He does not have a sickly appearance. He does not appear ill. No distress.  HENT:  Mouth/Throat: Oropharynx is clear and moist. No oropharyngeal exudate.  Eyes: Conjunctivae are normal. Right eye exhibits no discharge. Left eye exhibits no discharge.  No scleral icterus.  Neck: Normal range of motion. Neck supple. No JVD present. No tracheal deviation present. No thyromegaly present.  Cardiovascular: Normal rate, regular rhythm, normal heart sounds and intact distal pulses.  Exam reveals no gallop and no friction rub.   No murmur heard. Pulmonary/Chest: Effort normal and breath sounds normal. No stridor. No respiratory distress. He has no wheezes. He has no rales. He exhibits no tenderness.  Abdominal: Soft. Bowel sounds are normal. He exhibits no distension and no mass. There is no tenderness. There is no rebound and no guarding.  Musculoskeletal: Normal range of motion. He exhibits no edema or tenderness.  Lymphadenopathy:    He has no cervical adenopathy.  Neurological: He is oriented to person, place, and time.  Skin: Skin is warm and dry. No rash noted. He is not diaphoretic. No erythema. No pallor.  Psychiatric: He has a normal mood and affect. His behavior is normal. Judgment and thought content normal.    Lab Results  Component Value Date   WBC 10.0 07/30/2015   HGB 13.2 07/30/2015   HCT 39.5 07/30/2015   PLT 206.0 07/30/2015   GLUCOSE 111* 07/30/2015   CHOL 173 03/11/2015   TRIG 103.0 03/11/2015   HDL 39.90 03/11/2015   LDLCALC 113* 03/11/2015   ALT 13 03/11/2015   AST 18 03/11/2015   NA 141 07/30/2015   K 3.7 07/30/2015   CL 108 07/30/2015   CREATININE 0.91 07/30/2015   BUN 14 07/30/2015   CO2 27 07/30/2015   TSH 1.30 03/11/2015   PSA 3.29 02/07/2015   INR 1.4* 02/21/2014   HGBA1C 5.9 03/11/2015    Dg Chest 2 View  04/12/2015   CLINICAL DATA:  Cough. Congestion and fever for 2 days. Initial encounter.  EXAM: CHEST  2 VIEW  COMPARISON:  Chest radiograph 12/07/2013.  FINDINGS: Cardiopericardial silhouette within normal is for projection. 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 Dual lead LEFT subclavian cardiac pacemaker. Gas is interposed between the hemidiaphragms bilaterally, most compatible with colonic  interposition. No convincing evidence of free air in the abdomen. Chronic elevation of the LEFT hemidiaphragm with basilar atelectasis. Thoracic kyphosis. Chronic lower thoracic compression fracture appears unchanged compared to prior exams.  No airspace disease.  No pleural effusion.  IMPRESSION: No acute cardiopulmonary disease. Chronic elevation of the LEFT hemidiaphragm and postsurgical changes of the chest.   Electronically Signed   By: Andreas Newport M.D.   On: 04/12/2015 14:13    Assessment & Plan:   Ellison was seen today for hypertension.  Diagnoses and all orders for this visit:  Essential hypertension, benign- his BP is well controlled  The diarrhea has resolved, the only positives on his stool were yeast and lactoferrin, this appears to have resolved, he will let me know if the diarrhea returns.   I am having Mr. Gains maintain his fexofenadine, omeprazole, Calcium-Magnesium-Vitamin D,  polyethylene glycol, PROLIA, Multiple Vitamins-Minerals (OCUVITE-LUTEIN PO), ferrous sulfate, cyanocobalamin, terazosin, finasteride, nitroGLYCERIN, triamcinolone cream, atorvastatin, metoprolol tartrate, Doxepin HCl, and apixaban.  No orders of the defined types were placed in this encounter.     Follow-up: Return in about 6 months (around 03/30/2016).  Sanda Linger, MD

## 2015-10-10 ENCOUNTER — Telehealth: Payer: Self-pay | Admitting: Internal Medicine

## 2015-10-10 NOTE — Telephone Encounter (Signed)
Can you please make sure prolia is ordered for patient.  He will be in on 11/15.

## 2015-10-13 NOTE — Telephone Encounter (Signed)
Med in refrig on side a with patient name

## 2015-10-21 ENCOUNTER — Ambulatory Visit (INDEPENDENT_AMBULATORY_CARE_PROVIDER_SITE_OTHER): Payer: Medicare Other

## 2015-10-21 DIAGNOSIS — E538 Deficiency of other specified B group vitamins: Secondary | ICD-10-CM | POA: Diagnosis not present

## 2015-10-21 MED ORDER — CYANOCOBALAMIN 1000 MCG/ML IJ SOLN
1000.0000 ug | Freq: Once | INTRAMUSCULAR | Status: AC
  Administered 2015-10-21: 1000 ug via INTRAMUSCULAR

## 2015-11-04 ENCOUNTER — Telehealth: Payer: Self-pay | Admitting: Internal Medicine

## 2015-11-04 ENCOUNTER — Ambulatory Visit (INDEPENDENT_AMBULATORY_CARE_PROVIDER_SITE_OTHER): Payer: Medicare Other

## 2015-11-04 DIAGNOSIS — M810 Age-related osteoporosis without current pathological fracture: Secondary | ICD-10-CM | POA: Diagnosis not present

## 2015-11-04 MED ORDER — DENOSUMAB 60 MG/ML ~~LOC~~ SOLN
60.0000 mg | Freq: Once | SUBCUTANEOUS | Status: AC
  Administered 2015-11-04: 60 mg via SUBCUTANEOUS

## 2015-11-04 NOTE — Telephone Encounter (Signed)
Can you please check on prolia injection for May 2017

## 2015-11-11 ENCOUNTER — Ambulatory Visit (INDEPENDENT_AMBULATORY_CARE_PROVIDER_SITE_OTHER): Payer: Medicare Other

## 2015-11-11 DIAGNOSIS — E538 Deficiency of other specified B group vitamins: Secondary | ICD-10-CM

## 2015-11-11 MED ORDER — CYANOCOBALAMIN 1000 MCG/ML IJ SOLN
1000.0000 ug | Freq: Once | INTRAMUSCULAR | Status: AC
  Administered 2015-11-11: 1000 ug via INTRAMUSCULAR

## 2015-11-18 ENCOUNTER — Other Ambulatory Visit: Payer: Self-pay | Admitting: Cardiology

## 2015-11-18 ENCOUNTER — Ambulatory Visit (INDEPENDENT_AMBULATORY_CARE_PROVIDER_SITE_OTHER): Payer: Medicare Other | Admitting: *Deleted

## 2015-11-18 DIAGNOSIS — I495 Sick sinus syndrome: Secondary | ICD-10-CM

## 2015-11-18 NOTE — Telephone Encounter (Signed)
Rx(s) sent to pharmacy electronically.  

## 2015-11-20 NOTE — Progress Notes (Signed)
Remote pacemaker transmission.   

## 2015-12-01 LAB — CUP PACEART REMOTE DEVICE CHECK
Battery Impedance: 128 Ohm
Battery Remaining Longevity: 135 mo
Brady Statistic RV Percent Paced: 99 %
Implantable Lead Implant Date: 20070905
Implantable Lead Location: 753859
Implantable Lead Model: 5076
Implantable Lead Model: 5076
MDC IDC LEAD IMPLANT DT: 20070905
MDC IDC LEAD LOCATION: 753860
MDC IDC MSMT BATTERY VOLTAGE: 2.79 V
MDC IDC MSMT LEADCHNL RA IMPEDANCE VALUE: 67 Ohm
MDC IDC MSMT LEADCHNL RV IMPEDANCE VALUE: 515 Ohm
MDC IDC SESS DTM: 20161129171545
MDC IDC SET LEADCHNL RV PACING AMPLITUDE: 2 V
MDC IDC SET LEADCHNL RV PACING PULSEWIDTH: 0.4 ms
MDC IDC SET LEADCHNL RV SENSING SENSITIVITY: 4 mV

## 2015-12-02 ENCOUNTER — Encounter: Payer: Self-pay | Admitting: Cardiology

## 2015-12-02 ENCOUNTER — Ambulatory Visit (INDEPENDENT_AMBULATORY_CARE_PROVIDER_SITE_OTHER): Payer: Medicare Other

## 2015-12-02 DIAGNOSIS — E538 Deficiency of other specified B group vitamins: Secondary | ICD-10-CM | POA: Diagnosis not present

## 2015-12-02 MED ORDER — CYANOCOBALAMIN 1000 MCG/ML IJ SOLN
1000.0000 ug | Freq: Once | INTRAMUSCULAR | Status: AC
  Administered 2015-12-02: 1000 ug via INTRAMUSCULAR

## 2015-12-03 ENCOUNTER — Telehealth: Payer: Self-pay | Admitting: Internal Medicine

## 2015-12-03 NOTE — Telephone Encounter (Signed)
Patients wife if requesting samples of eliquis

## 2015-12-05 NOTE — Telephone Encounter (Signed)
LMOVM. Informed pt I dd not see any samples at the moment but I did see a co-pay card if they needed it

## 2015-12-25 ENCOUNTER — Ambulatory Visit (INDEPENDENT_AMBULATORY_CARE_PROVIDER_SITE_OTHER): Payer: Medicare Other

## 2015-12-25 DIAGNOSIS — E538 Deficiency of other specified B group vitamins: Secondary | ICD-10-CM

## 2015-12-25 MED ORDER — CYANOCOBALAMIN 1000 MCG/ML IJ SOLN
1000.0000 ug | Freq: Once | INTRAMUSCULAR | Status: AC
  Administered 2015-12-25: 1000 ug via INTRAMUSCULAR

## 2015-12-31 ENCOUNTER — Other Ambulatory Visit: Payer: Self-pay | Admitting: Internal Medicine

## 2016-01-01 ENCOUNTER — Other Ambulatory Visit: Payer: Self-pay

## 2016-01-01 DIAGNOSIS — E785 Hyperlipidemia, unspecified: Secondary | ICD-10-CM

## 2016-01-01 DIAGNOSIS — I25709 Atherosclerosis of coronary artery bypass graft(s), unspecified, with unspecified angina pectoris: Secondary | ICD-10-CM

## 2016-01-01 DIAGNOSIS — I2510 Atherosclerotic heart disease of native coronary artery without angina pectoris: Secondary | ICD-10-CM

## 2016-01-01 DIAGNOSIS — I2584 Coronary atherosclerosis due to calcified coronary lesion: Secondary | ICD-10-CM

## 2016-01-01 MED ORDER — ATORVASTATIN CALCIUM 40 MG PO TABS: 40.0000 mg | ORAL_TABLET | Freq: Every day | ORAL | Status: DC

## 2016-01-15 ENCOUNTER — Ambulatory Visit (INDEPENDENT_AMBULATORY_CARE_PROVIDER_SITE_OTHER): Payer: Medicare Other

## 2016-01-15 DIAGNOSIS — E538 Deficiency of other specified B group vitamins: Secondary | ICD-10-CM | POA: Diagnosis not present

## 2016-01-15 MED ORDER — CYANOCOBALAMIN 1000 MCG/ML IJ SOLN
1000.0000 ug | Freq: Once | INTRAMUSCULAR | Status: AC
  Administered 2016-01-15: 1000 ug via INTRAMUSCULAR

## 2016-01-21 ENCOUNTER — Telehealth: Payer: Self-pay | Admitting: Internal Medicine

## 2016-01-21 DIAGNOSIS — G4700 Insomnia, unspecified: Secondary | ICD-10-CM

## 2016-01-21 NOTE — Telephone Encounter (Signed)
Please advise 

## 2016-01-21 NOTE — Telephone Encounter (Signed)
Pt wondering if you have samples for Doxepin HCl (SILENOR) 6 MG TABS [[id number]

## 2016-01-22 MED ORDER — DOXEPIN HCL 6 MG PO TABS: 1.0000 | ORAL_TABLET | Freq: Every evening | ORAL | Status: DC | PRN

## 2016-01-22 NOTE — Telephone Encounter (Signed)
Pt informed

## 2016-01-22 NOTE — Telephone Encounter (Signed)
Pt called back and he only has 1 pill left

## 2016-01-22 NOTE — Telephone Encounter (Signed)
I don't have any samples right now. I hope to get some in soon. I sent a prescription to his pharmacy to make sure he doesn't run out.

## 2016-01-23 ENCOUNTER — Other Ambulatory Visit: Payer: Self-pay | Admitting: Cardiology

## 2016-01-23 NOTE — Telephone Encounter (Signed)
Rx request sent to pharmacy.  

## 2016-01-28 ENCOUNTER — Ambulatory Visit: Payer: Medicare Other

## 2016-01-28 NOTE — Progress Notes (Signed)
HPI: FU CAD s/p CABG, atrial fibrillation, HTN. Per notes, he had CABG 1993 with LIMA- LAD, SVG to OM, SVG to left circumflex, SVG to PD/PLSA. He had PCI of the saphenous vein graft to his obtuse marginal in 2003. Abdominal CT in June 2011 showed no abdominal aortic aneurysm. He was hospitalized February 2015 for GIB with Hgb down to 7.6 (was 11-12 in December). He was treated with Vit K and PRBCs. Coumadin was held. Last echocardiogram April 2015 showed an ejection fraction of 50-55%, mild mitral regurgitation and trace aortic insufficiency. Now on apixaban. Since he was last seen, the patient denies any dyspnea on exertion, orthopnea, PND, pedal edema, palpitations, syncope or chest pain.   Current Outpatient Prescriptions  Medication Sig Dispense Refill  . atorvastatin (LIPITOR) 40 MG tablet Take 1 tablet (40 mg total) by mouth daily. 90 tablet 2  . Calcium-Magnesium-Vitamin D (CITRACAL CALCIUM+D) 600-40-500 MG-MG-UNIT TB24 Take 2 tablets by mouth daily.     . cyanocobalamin (,VITAMIN B-12,) 1000 MCG/ML injection Inject 1,000 mcg into the muscle every 21 ( twenty-one) days.    . Doxepin HCl (SILENOR) 6 MG TABS Take 1 tablet (6 mg total) by mouth at bedtime as needed. 30 tablet 11  . ELIQUIS 5 MG TABS tablet Take 1 tablet by mouth two  times daily 180 tablet 0  . ferrous sulfate 325 (65 FE) MG tablet Take 1 tablet (325 mg total) by mouth 2 (two) times daily with a meal. (Patient taking differently: Take 325 mg by mouth daily with breakfast. ) 180 tablet 1  . fexofenadine (ALLEGRA) 180 MG tablet Take 180 mg by mouth at bedtime as needed (for allergies).     . finasteride (PROSCAR) 5 MG tablet Take 1 tablet by mouth at  bedtime 90 tablet 3  . metoprolol tartrate (LOPRESSOR) 25 MG tablet Take 1 tablet by mouth  twice a day 180 tablet 2  . Multiple Vitamins-Minerals (OCUVITE-LUTEIN PO) Take 1 tablet by mouth daily.     . nitroGLYCERIN (NITROSTAT) 0.4 MG SL tablet Place 1 tablet (0.4 mg total)  under the tongue every 5 (five) minutes x 3 doses as needed for chest pain. 25 tablet 1  . omeprazole (PRILOSEC) 40 MG capsule Take 40 mg by mouth daily.      . polyethylene glycol (MIRALAX / GLYCOLAX) packet Take 17 g by mouth daily as needed for moderate constipation.     [name] Kitchen PROLIA 60 MG/ML SOLN injection Inject 60 mg into the skin every 6 (six) months.     . terazosin (HYTRIN) 5 MG capsule Take 1 capsule by mouth  daily 90 capsule 3  . triamcinolone cream (KENALOG) 0.1 % Apply 1 application topically as needed.  0   No current facility-administered medications for this visit.     Past Medical History  Diagnosis Date  . Atrial fibrillation (HCC)     a. Was on Coumadin until GIB 01/2014.  [name] Kitchen Hyperlipidemia   . Bronchiectasis without acute exacerbation (HCC)   . Disorders of diaphragm   . Plantar fascial fibromatosis   . Epistaxis   . Unspecified sinusitis (chronic)   . Allergic rhinitis, cause unspecified   . Spinal stenosis, unspecified region other than cervical   . Hypertension   . Coronary artery disease     a. s/p CABG 1993. b. s/p PCI of SVG to OM '03.  . Tachy-brady syndrome [facility])     s/p Medtronic PPM '07  . Myocardial infarction (HCC)   .  GERD (gastroesophageal reflux disease)   . Arthritis   . H/O: GI bleed     a. 01/2014 - presumed diverticular. Received PRBC, Vit K. Coumadin put on hold.  . Diverticulosis   . LV dysfunction     a. Echo 08/2013: EF 45-50%.    Past Surgical History  Procedure Laterality Date  . Coronary artery bypass graft      LIMA to LAD, SVG to OM, SVG to left circumflex, SVG to PD/PLSA  . Pacemaker insertion      2007, Medtronic dual-chamber  . Kidney cyst removal    . Doppler echocardiography  2011  . Cholecystectomy    . Vertebroplasty    . Cataract extraction w/ intraocular lens  implant, bilateral    . Hip arthroplasty Right 12/10/2013    Procedure: Right Hip Cemented Hemiarthroplasty;  Surgeon: Eldred Manges, MD;  Location: [facility] OR;   Service: Orthopedics;  Laterality: Right;  Right Hip Cemented Hemiarthroplasty  . Colonoscopy    . Permanent pacemaker generator change N/A 08/14/2014    Procedure: PERMANENT PACEMAKER GENERATOR CHANGE;  Surgeon: Marinus Maw, MD;  Location: [facility] CATH LAB;  Service: Cardiovascular;  Laterality: N/A;    Social History   Social History  . Marital Status: Married    Spouse Name: Ollen Gross  . Number of Children: 4  . Years of Education: N/A   Occupational History  .     Social History Main Topics  . Smoking status: Former Games developer  . Smokeless tobacco: Never Used     Comment: x 6 months in high school  . Alcohol Use: No     Comment: none  . Drug Use: No  . Sexual Activity: No   Other Topics Concern  . Not on file   Social History Narrative   Married.  Ambulates with a walker.    Family History  Problem Relation Age of Onset  . Colon cancer Neg Hx   . Heart disease Neg Hx   . Stroke Mother   . Throat cancer Neg Hx   . Pancreatic cancer Neg Hx   . Diabetes Son   . Kidney disease Neg Hx   . Liver disease Neg Hx   . Osteoporosis Neg Hx     ROS: no fevers or chills, productive cough, hemoptysis, dysphasia, odynophagia, melena, hematochezia, dysuria, hematuria, rash, seizure activity, orthopnea, PND, pedal edema, claudication. Remaining systems are negative.  Physical Exam: Well-developed frail in no acute distress.  Skin is warm and dry.  HEENT is normal.  Neck is supple.  Chest is clear to auscultation with normal expansion.  Cardiovascular exam is regular rate and rhythm.  Abdominal exam nontender or distended. No masses palpated. Extremities show no edema. neuro grossly intact  ECG Ventricular pacing with underlying atrial fibrillation.

## 2016-02-02 ENCOUNTER — Ambulatory Visit (INDEPENDENT_AMBULATORY_CARE_PROVIDER_SITE_OTHER): Payer: Medicare Other | Admitting: Cardiology

## 2016-02-02 ENCOUNTER — Encounter: Payer: Self-pay | Admitting: Cardiology

## 2016-02-02 VITALS — BP 126/70 | HR 81 | Ht 71.0 in | Wt 171.0 lb

## 2016-02-02 DIAGNOSIS — I4821 Permanent atrial fibrillation: Secondary | ICD-10-CM

## 2016-02-02 DIAGNOSIS — E785 Hyperlipidemia, unspecified: Secondary | ICD-10-CM

## 2016-02-02 DIAGNOSIS — I2583 Coronary atherosclerosis due to lipid rich plaque: Secondary | ICD-10-CM

## 2016-02-02 DIAGNOSIS — I2584 Coronary atherosclerosis due to calcified coronary lesion: Secondary | ICD-10-CM | POA: Diagnosis not present

## 2016-02-02 DIAGNOSIS — I2510 Atherosclerotic heart disease of native coronary artery without angina pectoris: Secondary | ICD-10-CM | POA: Diagnosis not present

## 2016-02-02 DIAGNOSIS — I482 Chronic atrial fibrillation: Secondary | ICD-10-CM

## 2016-02-02 DIAGNOSIS — Z950 Presence of cardiac pacemaker: Secondary | ICD-10-CM

## 2016-02-02 DIAGNOSIS — I10 Essential (primary) hypertension: Secondary | ICD-10-CM | POA: Diagnosis not present

## 2016-02-02 NOTE — Assessment & Plan Note (Signed)
Continue statin. 

## 2016-02-02 NOTE — Assessment & Plan Note (Signed)
Continue statin.No aspirin given need for anticoagulation. 

## 2016-02-02 NOTE — Patient Instructions (Signed)
Medication Instructions:   NO CHANGE  Labwork:  Your physician recommends that you return for lab work PRIOR TO EATING  Follow-Up:  Your physician wants you to follow-up in: 6 MONTHS WITH DR CRENSHAW You will receive a reminder letter in the mail two months in advance. If you don't receive a letter, please call our office to schedule the follow-up appointment.   If you need a refill on your cardiac medications before your next appointment, please call your pharmacy.    

## 2016-02-02 NOTE — Assessment & Plan Note (Signed)
Management per electrophysiology. 

## 2016-02-02 NOTE — Assessment & Plan Note (Signed)
Blood pressure control. Continue present medications. Check potassium and renal function. 

## 2016-02-02 NOTE — Assessment & Plan Note (Signed)
Plan to continue beta blocker. Continue apixaban. Check hemoglobin and renal function.

## 2016-02-04 DIAGNOSIS — I482 Chronic atrial fibrillation: Secondary | ICD-10-CM | POA: Diagnosis not present

## 2016-02-04 DIAGNOSIS — E785 Hyperlipidemia, unspecified: Secondary | ICD-10-CM | POA: Diagnosis not present

## 2016-02-04 DIAGNOSIS — I4891 Unspecified atrial fibrillation: Secondary | ICD-10-CM | POA: Diagnosis not present

## 2016-02-04 LAB — CBC
HEMATOCRIT: 43.6 % (ref 39.0–52.0)
Hemoglobin: 14.7 g/dL (ref 13.0–17.0)
MCH: 32 pg (ref 26.0–34.0)
MCHC: 33.7 g/dL (ref 30.0–36.0)
MCV: 94.8 fL (ref 78.0–100.0)
MPV: 11 fL (ref 8.6–12.4)
Platelets: 213 10*3/uL (ref 150–400)
RBC: 4.6 MIL/uL (ref 4.22–5.81)
RDW: 14.4 % (ref 11.5–15.5)
WBC: 6.4 10*3/uL (ref 4.0–10.5)

## 2016-02-04 LAB — LIPID PANEL
CHOL/HDL RATIO: 3.1 ratio (ref <=5.0)
Cholesterol: 138 mg/dL (ref 125–200)
HDL: 44 mg/dL (ref >=40)
LDL Cholesterol: 80 mg/dL (ref <130)
Triglycerides: 70 mg/dL (ref <150)
VLDL: 14 mg/dL (ref <30)

## 2016-02-04 LAB — BASIC METABOLIC PANEL
BUN: 15 mg/dL (ref 7–25)
CALCIUM: 9 mg/dL (ref 8.6–10.3)
CHLORIDE: 108 mmol/L (ref 98–110)
CO2: 24 mmol/L (ref 20–31)
Creat: 0.86 mg/dL (ref 0.70–1.11)
Glucose, Bld: 100 mg/dL — ABNORMAL HIGH (ref 65–99)
Potassium: 4.1 mmol/L (ref 3.5–5.3)
Sodium: 139 mmol/L (ref 135–146)

## 2016-02-04 LAB — HEPATIC FUNCTION PANEL
ALK PHOS: 73 U/L (ref 40–115)
ALT: 16 U/L (ref 9–46)
AST: 21 U/L (ref 10–35)
Albumin: 3.8 g/dL (ref 3.6–5.1)
BILIRUBIN DIRECT: 0.2 mg/dL (ref <=0.2)
BILIRUBIN INDIRECT: 0.5 mg/dL (ref 0.2–1.2)
BILIRUBIN TOTAL: 0.7 mg/dL (ref 0.2–1.2)
Total Protein: 6.8 g/dL (ref 6.1–8.1)

## 2016-02-05 ENCOUNTER — Ambulatory Visit (INDEPENDENT_AMBULATORY_CARE_PROVIDER_SITE_OTHER): Payer: Medicare Other | Admitting: Emergency Medicine

## 2016-02-05 DIAGNOSIS — D519 Vitamin B12 deficiency anemia, unspecified: Secondary | ICD-10-CM

## 2016-02-05 MED ORDER — CYANOCOBALAMIN 1000 MCG/ML IJ SOLN
1000.0000 ug | INTRAMUSCULAR | Status: DC
  Administered 2016-02-05: 1000 ug via INTRAMUSCULAR

## 2016-02-06 ENCOUNTER — Encounter: Payer: Self-pay | Admitting: *Deleted

## 2016-02-11 DIAGNOSIS — M170 Bilateral primary osteoarthritis of knee: Secondary | ICD-10-CM | POA: Diagnosis not present

## 2016-02-11 DIAGNOSIS — M5137 Other intervertebral disc degeneration, lumbosacral region: Secondary | ICD-10-CM | POA: Diagnosis not present

## 2016-02-11 DIAGNOSIS — M19241 Secondary osteoarthritis, right hand: Secondary | ICD-10-CM | POA: Diagnosis not present

## 2016-02-11 DIAGNOSIS — M810 Age-related osteoporosis without current pathological fracture: Secondary | ICD-10-CM | POA: Diagnosis not present

## 2016-02-17 ENCOUNTER — Ambulatory Visit (INDEPENDENT_AMBULATORY_CARE_PROVIDER_SITE_OTHER): Payer: Medicare Other | Admitting: *Deleted

## 2016-02-17 ENCOUNTER — Telehealth: Payer: Self-pay | Admitting: Cardiology

## 2016-02-17 DIAGNOSIS — I495 Sick sinus syndrome: Secondary | ICD-10-CM | POA: Diagnosis not present

## 2016-02-17 NOTE — Telephone Encounter (Signed)
Spoke with pt and reminded pt of remote transmission that is due today. Pt verbalized understanding.   

## 2016-02-17 NOTE — Progress Notes (Signed)
Remote pacemaker transmission.   

## 2016-02-26 ENCOUNTER — Other Ambulatory Visit: Payer: Self-pay

## 2016-02-26 ENCOUNTER — Ambulatory Visit (INDEPENDENT_AMBULATORY_CARE_PROVIDER_SITE_OTHER): Payer: Medicare Other

## 2016-02-26 DIAGNOSIS — G4700 Insomnia, unspecified: Secondary | ICD-10-CM

## 2016-02-26 DIAGNOSIS — E538 Deficiency of other specified B group vitamins: Secondary | ICD-10-CM | POA: Diagnosis not present

## 2016-02-26 MED ORDER — DOXEPIN HCL 6 MG PO TABS: 1.0000 | ORAL_TABLET | Freq: Every evening | ORAL | Status: DC | PRN

## 2016-02-26 MED ORDER — CYANOCOBALAMIN 1000 MCG/ML IJ SOLN
1000.0000 ug | Freq: Once | INTRAMUSCULAR | Status: AC
  Administered 2016-02-26: 1000 ug via INTRAMUSCULAR

## 2016-03-02 ENCOUNTER — Telehealth: Payer: Self-pay | Admitting: Internal Medicine

## 2016-03-02 NOTE — Telephone Encounter (Signed)
Can you please call patient. It is regarding his B12 injection.  I couldn't quite understand what he needed. Hopefully you can understand.

## 2016-03-03 NOTE — Telephone Encounter (Signed)
Yes, I do have some

## 2016-03-03 NOTE — Telephone Encounter (Signed)
Pt inquiring about silenor samples?

## 2016-03-04 NOTE — Telephone Encounter (Signed)
Pt informed

## 2016-03-15 DIAGNOSIS — J018 Other acute sinusitis: Secondary | ICD-10-CM | POA: Diagnosis not present

## 2016-03-18 ENCOUNTER — Telehealth: Payer: Self-pay | Admitting: Internal Medicine

## 2016-03-18 ENCOUNTER — Ambulatory Visit (INDEPENDENT_AMBULATORY_CARE_PROVIDER_SITE_OTHER): Payer: Medicare Other

## 2016-03-18 DIAGNOSIS — E538 Deficiency of other specified B group vitamins: Secondary | ICD-10-CM

## 2016-03-18 MED ORDER — CYANOCOBALAMIN 1000 MCG/ML IJ SOLN
1000.0000 ug | Freq: Once | INTRAMUSCULAR | Status: AC
  Administered 2016-03-18: 1000 ug via INTRAMUSCULAR

## 2016-03-18 NOTE — Telephone Encounter (Signed)
Pt called stated it is time for him to get another prolia injection (its been over 6 month since the last one.

## 2016-03-19 ENCOUNTER — Encounter: Payer: Self-pay | Admitting: Cardiology

## 2016-03-19 LAB — CUP PACEART REMOTE DEVICE CHECK
Battery Impedance: 153 Ohm
Battery Voltage: 2.78 V
Date Time Interrogation Session: 20170228152754
Implantable Lead Implant Date: 20070905
Implantable Lead Location: 753859
Implantable Lead Location: 753860
Lead Channel Pacing Threshold Amplitude: 1 V
Lead Channel Setting Pacing Amplitude: 2 V
MDC IDC LEAD IMPLANT DT: 20070905
MDC IDC MSMT BATTERY REMAINING LONGEVITY: 128 mo
MDC IDC MSMT LEADCHNL RA IMPEDANCE VALUE: 67 Ohm
MDC IDC MSMT LEADCHNL RV IMPEDANCE VALUE: 513 Ohm
MDC IDC MSMT LEADCHNL RV PACING THRESHOLD PULSEWIDTH: 0.4 ms
MDC IDC SET LEADCHNL RV PACING PULSEWIDTH: 0.4 ms
MDC IDC SET LEADCHNL RV SENSING SENSITIVITY: 5.6 mV
MDC IDC STAT BRADY RV PERCENT PACED: 99 %

## 2016-03-22 DIAGNOSIS — L821 Other seborrheic keratosis: Secondary | ICD-10-CM | POA: Diagnosis not present

## 2016-03-24 ENCOUNTER — Other Ambulatory Visit: Payer: Self-pay | Admitting: Internal Medicine

## 2016-03-24 DIAGNOSIS — G4700 Insomnia, unspecified: Secondary | ICD-10-CM

## 2016-03-24 MED ORDER — DOXEPIN HCL 6 MG PO TABS: 1.0000 | ORAL_TABLET | Freq: Every evening | ORAL | Status: DC | PRN

## 2016-03-29 ENCOUNTER — Ambulatory Visit: Payer: Medicare Other

## 2016-03-29 NOTE — Telephone Encounter (Signed)
I have electronically submitted pt's info for Prolia insurance verification and will notify you once I have a response. Thank you. °

## 2016-04-06 NOTE — Telephone Encounter (Signed)
Patient advised of estimated $0 copay---patient has scheduled nurse visit for 4/19 to get b12 injection and also prolia injection

## 2016-04-06 NOTE — Telephone Encounter (Signed)
I have rec'd [name]'s insurance verification for Prolia and he has an estimated responsibility of $0.  Please make pt aware this is an estimate and we will not know an exact amt until insurance(s) has/have paid.  I have sent a copy of the summary of benefits to be scanned into pt's chart.    Once pt recs injection, please let me know actual injection date so I can update the Prolia portal.  If you have any questions, please let me know.

## 2016-04-07 ENCOUNTER — Ambulatory Visit (INDEPENDENT_AMBULATORY_CARE_PROVIDER_SITE_OTHER): Payer: Medicare Other | Admitting: Geriatric Medicine

## 2016-04-07 DIAGNOSIS — M810 Age-related osteoporosis without current pathological fracture: Secondary | ICD-10-CM

## 2016-04-07 DIAGNOSIS — E538 Deficiency of other specified B group vitamins: Secondary | ICD-10-CM

## 2016-04-07 MED ORDER — DENOSUMAB 60 MG/ML ~~LOC~~ SOLN
60.0000 mg | Freq: Once | SUBCUTANEOUS | Status: AC
  Administered 2016-04-07: 60 mg via SUBCUTANEOUS

## 2016-04-07 MED ORDER — CYANOCOBALAMIN 1000 MCG/ML IJ SOLN
1000.0000 ug | Freq: Once | INTRAMUSCULAR | Status: AC
  Administered 2016-04-07: 1000 ug via INTRAMUSCULAR

## 2016-04-09 ENCOUNTER — Telehealth: Payer: Self-pay | Admitting: Internal Medicine

## 2016-04-09 NOTE — Telephone Encounter (Signed)
Pt called [facility] call them and said that Silenor 6 mg is not on their list but pt is unsure what they want our office to do. Please help pt get this med.   [facility] # [id number]

## 2016-04-13 NOTE — Telephone Encounter (Signed)
Called and left message for patient to call me back, needs to talk with tamara---i have his mail order as being [facility] in [place]---if he wants med sent to [facility], we can, but we have already ordered thru [facility] and they will prob be coming in mail very soon----

## 2016-04-14 NOTE — Telephone Encounter (Signed)
Left msg on triage requesting call back concerning msg below. Forwarding msg back to Meridianamara.[name] Kitchen.Raechel Chute/lmb

## 2016-04-15 NOTE — Telephone Encounter (Signed)
i talked with patient'[s wife, i can't understand what she is trying to say---mrs Oshields will have her sitter call me back when she gets to patient's home later this am

## 2016-04-15 NOTE — Telephone Encounter (Signed)
margarie Psychiatrist(sitter) called and left vm asking me to call back, i have called back and left message for her to return my call

## 2016-04-17 IMAGING — CT CT KNEE*R* W/CM
2 of 4 series · 4 of 14 positions shown, 5 images · non-contrast
Comparison: Radiographs 05/24/2014.  Injection images today.

CLINICAL DATA: 82-year-old with right knee pain after falling.
History of pacemaker.

EXAM:
CT ARTHROGRAPHY OF THE RIGHT KNEE
TECHNIQUE: Multidetector CT imaging was performed following the standard
protocol after injection of dilute contrast into the joint.

[Series 101: rt knee detail · axial · 0.35mm/px · z∈[-264,-202]mm · 2 of 75 slices shown, 3 images]
[im 25/75  soft-tissue]
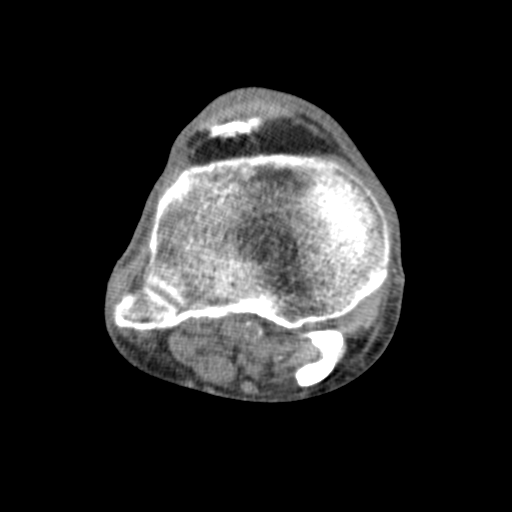
[im 25/75  bone]
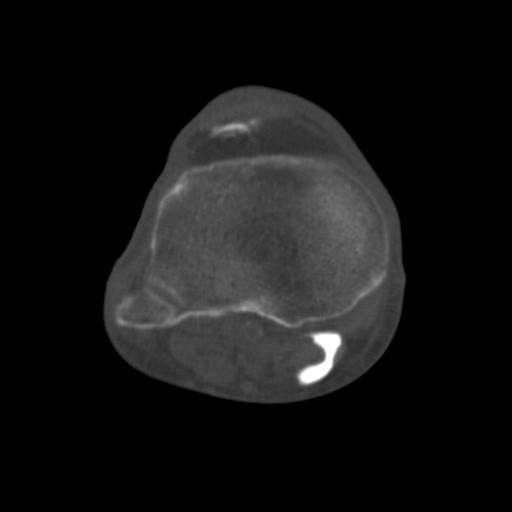
[im 50/75  bone]
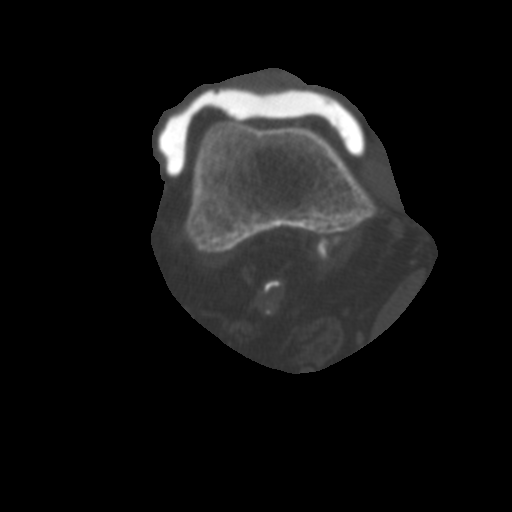

[Series 106: cor · coronal · 0.37mm/px · 2 of 77 slices shown]
[im 26/77  bone]
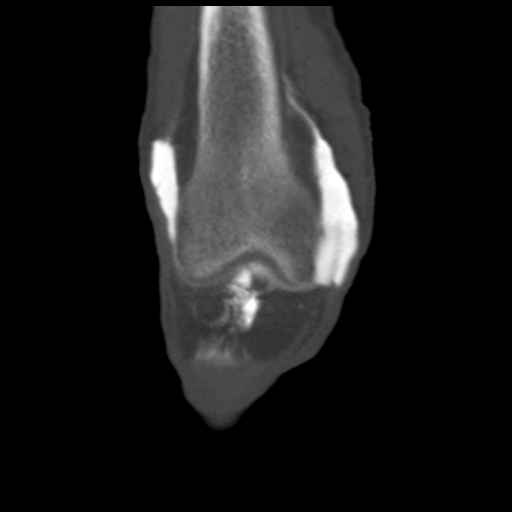
[im 51/77  bone]
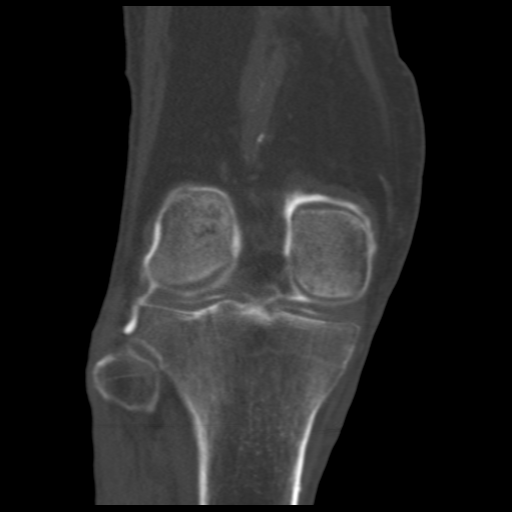

[4 of 14 positions shown; findings below may reference images not displayed]

FINDINGS: The joint is well distended with contrast. No intra-articular loose
bodies are identified. There is a moderate-sized Baker's cyst.

There is mild fissuring of the patellar cartilage at the apex and
over the lateral facet. There is a nearly full-thickness chondral
defect involving the medial femoral condyle posterolaterally. The
tibial cartilage appears intact. There is no evidence of acute
fracture.

The medial meniscus demonstrates normal morphology. There is mild
free edge blunting of the posterior horn of the lateral meniscus. As
evaluated by CT, the cruciate ligaments and extensor mechanism are
intact. There is some contrast material within Hoffa's fat
attributed to the injection.

Popliteal arterial calcifications are noted.
IMPRESSION: 1. Patellar cartilage fissuring with chondral defect involving the
posterolateral aspect of the medial femoral condyle.
2. No evidence of acute fracture or intra-articular loose body.
3. Mild free edge blunting of the posterior horn of the lateral
meniscus. No definite meniscal tear.

## 2016-04-19 ENCOUNTER — Ambulatory Visit: Payer: Medicare Other

## 2016-04-21 DIAGNOSIS — H35371 Puckering of macula, right eye: Secondary | ICD-10-CM | POA: Diagnosis not present

## 2016-04-21 DIAGNOSIS — H01001 Unspecified blepharitis right upper eyelid: Secondary | ICD-10-CM | POA: Diagnosis not present

## 2016-04-21 DIAGNOSIS — H353132 Nonexudative age-related macular degeneration, bilateral, intermediate dry stage: Secondary | ICD-10-CM | POA: Diagnosis not present

## 2016-04-21 DIAGNOSIS — H52222 Regular astigmatism, left eye: Secondary | ICD-10-CM | POA: Diagnosis not present

## 2016-04-21 DIAGNOSIS — H01002 Unspecified blepharitis right lower eyelid: Secondary | ICD-10-CM | POA: Diagnosis not present

## 2016-04-21 DIAGNOSIS — Z961 Presence of intraocular lens: Secondary | ICD-10-CM | POA: Diagnosis not present

## 2016-04-21 DIAGNOSIS — H01005 Unspecified blepharitis left lower eyelid: Secondary | ICD-10-CM | POA: Diagnosis not present

## 2016-04-21 DIAGNOSIS — H5212 Myopia, left eye: Secondary | ICD-10-CM | POA: Diagnosis not present

## 2016-04-21 DIAGNOSIS — H01004 Unspecified blepharitis left upper eyelid: Secondary | ICD-10-CM | POA: Diagnosis not present

## 2016-05-06 ENCOUNTER — Encounter: Payer: Self-pay | Admitting: Internal Medicine

## 2016-05-06 ENCOUNTER — Ambulatory Visit (INDEPENDENT_AMBULATORY_CARE_PROVIDER_SITE_OTHER): Payer: Medicare Other | Admitting: Internal Medicine

## 2016-05-06 VITALS — BP 108/80 | HR 85 | Temp 97.8°F | Resp 16 | Ht 71.0 in | Wt 167.0 lb

## 2016-05-06 DIAGNOSIS — I2584 Coronary atherosclerosis due to calcified coronary lesion: Secondary | ICD-10-CM

## 2016-05-06 DIAGNOSIS — I2510 Atherosclerotic heart disease of native coronary artery without angina pectoris: Secondary | ICD-10-CM

## 2016-05-06 DIAGNOSIS — F068 Other specified mental disorders due to known physiological condition: Secondary | ICD-10-CM | POA: Diagnosis not present

## 2016-05-06 DIAGNOSIS — D518 Other vitamin B12 deficiency anemias: Secondary | ICD-10-CM | POA: Diagnosis not present

## 2016-05-06 DIAGNOSIS — F0390 Unspecified dementia without behavioral disturbance: Secondary | ICD-10-CM | POA: Insufficient documentation

## 2016-05-06 MED ORDER — CYANOCOBALAMIN 1000 MCG/ML IJ SOLN
1000.0000 ug | Freq: Once | INTRAMUSCULAR | Status: AC
  Administered 2016-05-06: 1000 ug via INTRAMUSCULAR

## 2016-05-06 MED ORDER — MEMANTINE HCL-DONEPEZIL HCL 7 & 14 & 21 &28 -10 MG PO C4PK: 1.0000 | EXTENDED_RELEASE_CAPSULE | Freq: Every day | ORAL | Status: DC

## 2016-05-06 NOTE — Patient Instructions (Signed)

## 2016-05-06 NOTE — Progress Notes (Signed)
Subjective:  Patient ID: Dwayne [name], male    DOB: 07/08/1932  Age: 82 y.o. MRN: [id number]  CC: Dementia   HPI Dwayne [name] presents for concerns about memory loss and abnormal behavior. His wife tells me that he has had decline in his memory over the last year or 2. She reports that he has been calling her to come home when she has not left the house. His long-term memory is pretty good but his short-term memory has declined. She has not noticed any forms of hallucinations. He has a history of insomnia but symptoms are well controlled with Silenor. There've not been any abrupt changes of the last few months. His appetite has been well controlled but his ability to perform his ADLs is declining.  Outpatient Prescriptions Prior to Visit  Medication Sig Dispense Refill  . atorvastatin (LIPITOR) 40 MG tablet Take 1 tablet (40 mg total) by mouth daily. 90 tablet 2  . Calcium-Magnesium-Vitamin D (CITRACAL CALCIUM+D) 600-40-500 MG-MG-UNIT TB24 Take 2 tablets by mouth daily.     . cyanocobalamin (,VITAMIN B-12,) 1000 MCG/ML injection Inject 1,000 mcg into the muscle every 21 ( twenty-one) days.    . Doxepin HCl (SILENOR) 6 MG TABS Take 1 tablet (6 mg total) by mouth at bedtime as needed. 90 tablet 3  . ELIQUIS 5 MG TABS tablet Take 1 tablet by mouth two  times daily 180 tablet 0  . ferrous sulfate 325 (65 FE) MG tablet Take 1 tablet (325 mg total) by mouth 2 (two) times daily with a meal. (Patient taking differently: Take 325 mg by mouth daily with breakfast. ) 180 tablet 1  . fexofenadine (ALLEGRA) 180 MG tablet Take 180 mg by mouth at bedtime as needed (for allergies).     . finasteride (PROSCAR) 5 MG tablet Take 1 tablet by mouth at  bedtime 90 tablet 3  . metoprolol tartrate (LOPRESSOR) 25 MG tablet Take 1 tablet by mouth  twice a day 180 tablet 2  . Multiple Vitamins-Minerals (OCUVITE-LUTEIN PO) Take 1 tablet by mouth daily.     . nitroGLYCERIN (NITROSTAT) 0.4 MG SL tablet Place 1 tablet (0.4  mg total) under the tongue every 5 (five) minutes x 3 doses as needed for chest pain. 25 tablet 1  . omeprazole (PRILOSEC) 40 MG capsule Take 40 mg by mouth daily.      . polyethylene glycol (MIRALAX / GLYCOLAX) packet Take 17 g by mouth daily as needed for moderate constipation.     [name] Kitchen PROLIA 60 MG/ML SOLN injection Inject 60 mg into the skin every 6 (six) months.     . terazosin (HYTRIN) 5 MG capsule Take 1 capsule by mouth  daily 90 capsule 3  . triamcinolone cream (KENALOG) 0.1 % Apply 1 application topically as needed.  0   Facility-Administered Medications Prior to Visit  Medication Dose Route Frequency Provider Last Rate Last Dose  . cyanocobalamin ((VITAMIN B-12)) injection 1,000 mcg  1,000 mcg Intramuscular Q30 days [name], MD   1,000 mcg at 02/05/16 1415    ROS Review of Systems  Constitutional: Negative.  Negative for fever, chills, diaphoresis, appetite change and fatigue.  HENT: Negative.   Eyes: Negative.   Respiratory: Negative.  Negative for cough, choking, chest tightness, shortness of breath and stridor.   Cardiovascular: Negative.  Negative for chest pain, palpitations and leg swelling.  Gastrointestinal: Negative.  Negative for nausea, vomiting, abdominal pain, diarrhea and constipation.  Endocrine: Negative.   Genitourinary: Negative.  Negative for difficulty urinating.  Musculoskeletal: Negative.  Negative for myalgias, back pain, joint swelling and arthralgias.  Skin: Negative.  Negative for color change and rash.  Allergic/Immunologic: Negative.   Neurological: Negative.  Negative for dizziness, tremors, weakness, light-headedness and numbness.  Hematological: Negative.  Negative for adenopathy. Does not bruise/bleed easily.  Psychiatric/Behavioral: Positive for confusion, sleep disturbance and decreased concentration. Negative for suicidal ideas, self-injury and dysphoric mood. The patient is not nervous/anxious.     Objective:  BP 108/80 mmHg  Pulse 85   Temp(Src) 97.8 F (36.6 C) (Oral)  Resp 16  Ht  (1.803 m)  Wt 167 lb (75.751 kg)  BMI 23.30 kg/m2  SpO2 95%  BP Readings from Last 3 Encounters:  05/06/16 108/80  02/02/16 126/70  09/30/15 130/80    Wt Readings from Last 3 Encounters:  05/06/16 167 lb (75.751 kg)  02/02/16 171 lb (77.565 kg)  09/30/15 169 lb (76.658 kg)    Physical Exam  Psychiatric: He has a normal mood and affect. Thought content normal. His affect is not angry. His speech is delayed and tangential. His speech is not rapid and/or pressured. He is not agitated, not aggressive, not hyperactive, not slowed and not withdrawn. Cognition and memory are impaired. He does not express impulsivity or inappropriate judgment. He does not exhibit a depressed mood. He expresses no homicidal and no suicidal ideation. He expresses no suicidal plans and no homicidal plans. He exhibits abnormal recent memory and abnormal remote memory. He is inattentive.    Lab Results  Component Value Date   WBC 6.4 02/02/2016   HGB 14.7 02/02/2016   HCT 43.6 02/02/2016   PLT 213 02/02/2016   GLUCOSE 100* 02/02/2016   CHOL 138 02/02/2016   TRIG 70 02/02/2016   HDL 44 02/02/2016   LDLCALC 80 02/02/2016   ALT 16 02/02/2016   AST 21 02/02/2016   NA 139 02/02/2016   K 4.1 02/02/2016   CL 108 02/02/2016   CREATININE 0.86 02/02/2016   BUN 15 02/02/2016   CO2 24 02/02/2016   TSH 1.30 03/11/2015   PSA 3.29 02/07/2015   INR 1.4* 02/21/2014   HGBA1C 5.9 03/11/2015    Dg Chest 2 View  04/12/2015  CLINICAL DATA:  Cough. Congestion and fever for 2 days. Initial encounter. EXAM: CHEST  2 VIEW COMPARISON:  Chest radiograph 12/07/2013. FINDINGS: Cardiopericardial silhouette within normal is for projection. 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 Dual lead LEFT subclavian cardiac pacemaker. Gas is interposed between the hemidiaphragms bilaterally, most compatible with colonic interposition. No convincing evidence of free air in the abdomen.  Chronic elevation of the LEFT hemidiaphragm with basilar atelectasis. Thoracic kyphosis. Chronic lower thoracic compression fracture appears unchanged compared to prior exams. No airspace disease.  No pleural effusion. IMPRESSION: No acute cardiopulmonary disease. Chronic elevation of the LEFT hemidiaphragm and postsurgical changes of the chest. Electronically Signed   By: Andreas Newport M.D.   On: 04/12/2015 14:13    Assessment & Plan:   Corderro was seen today for dementia.  Diagnoses and all orders for this visit:  Dementia arising in the senium and presenium- he might benefit from a combination of memantine and doneprezil so I've given him a sample of Namzaric and I've instructed he and his wife how to start taking it. They will let me know if he develops any side effects. -     Memantine HCl-Donepezil HCl (NAMZARIC) 7 & 14 & 21 &28 -10 MG C4PK; Take 1 tablet by mouth  daily.  ANEMIA, B12 DEFICIENCY -     cyanocobalamin ((VITAMIN B-12)) injection 1,000 mcg; Inject 1 mL (1,000 mcg total) into the muscle once.   I am having Mr. Brooke DareKing start on Memantine HCl-Donepezil HCl. I am also having him maintain his fexofenadine, omeprazole, Calcium-Magnesium-Vitamin D, polyethylene glycol, PROLIA, Multiple Vitamins-Minerals (OCUVITE-LUTEIN PO), ferrous sulfate, cyanocobalamin, nitroGLYCERIN, triamcinolone cream, metoprolol tartrate, terazosin, finasteride, atorvastatin, ELIQUIS, and Doxepin HCl. We administered cyanocobalamin. We will continue to administer cyanocobalamin.  Meds ordered this encounter  Medications  . Memantine HCl-Donepezil HCl (NAMZARIC) 7 & 14 & 21 &28 -10 MG C4PK    Sig: Take 1 tablet by mouth daily.    Dispense:  28 each    Refill:  0  . cyanocobalamin ((VITAMIN B-12)) injection 1,000 mcg    Sig:      Follow-up: Return in about 4 weeks (around 06/03/2016).  Sanda Lingerhomas Gunner Iodice, MD

## 2016-05-06 NOTE — Progress Notes (Signed)
Pre visit review using our clinic review tool, if applicable. No additional management support is needed unless otherwise documented below in the visit note. 

## 2016-05-14 ENCOUNTER — Emergency Department (HOSPITAL_COMMUNITY)
Admission: EM | Admit: 2016-05-14 | Discharge: 2016-05-14 | Disposition: A | Discharge status: Home / Self Care | Payer: Medicare Other | Attending: Emergency Medicine | Admitting: Emergency Medicine

## 2016-05-14 ENCOUNTER — Encounter (HOSPITAL_COMMUNITY): Payer: Self-pay

## 2016-05-14 ENCOUNTER — Telehealth: Payer: Self-pay | Admitting: Internal Medicine

## 2016-05-14 DIAGNOSIS — N390 Urinary tract infection, site not specified: Secondary | ICD-10-CM | POA: Insufficient documentation

## 2016-05-14 DIAGNOSIS — Z951 Presence of aortocoronary bypass graft: Secondary | ICD-10-CM | POA: Insufficient documentation

## 2016-05-14 DIAGNOSIS — Z8739 Personal history of other diseases of the musculoskeletal system and connective tissue: Secondary | ICD-10-CM | POA: Diagnosis not present

## 2016-05-14 DIAGNOSIS — R531 Weakness: Secondary | ICD-10-CM | POA: Diagnosis present

## 2016-05-14 DIAGNOSIS — Z87891 Personal history of nicotine dependence: Secondary | ICD-10-CM | POA: Insufficient documentation

## 2016-05-14 DIAGNOSIS — K219 Gastro-esophageal reflux disease without esophagitis: Secondary | ICD-10-CM | POA: Insufficient documentation

## 2016-05-14 DIAGNOSIS — E785 Hyperlipidemia, unspecified: Secondary | ICD-10-CM | POA: Diagnosis not present

## 2016-05-14 DIAGNOSIS — I2510 Atherosclerotic heart disease of native coronary artery without angina pectoris: Secondary | ICD-10-CM | POA: Insufficient documentation

## 2016-05-14 DIAGNOSIS — I252 Old myocardial infarction: Secondary | ICD-10-CM | POA: Insufficient documentation

## 2016-05-14 DIAGNOSIS — I10 Essential (primary) hypertension: Secondary | ICD-10-CM | POA: Diagnosis not present

## 2016-05-14 DIAGNOSIS — Z8719 Personal history of other diseases of the digestive system: Secondary | ICD-10-CM | POA: Insufficient documentation

## 2016-05-14 DIAGNOSIS — Z8709 Personal history of other diseases of the respiratory system: Secondary | ICD-10-CM | POA: Insufficient documentation

## 2016-05-14 DIAGNOSIS — Z880 Allergy status to penicillin: Secondary | ICD-10-CM | POA: Diagnosis not present

## 2016-05-14 DIAGNOSIS — Z79899 Other long term (current) drug therapy: Secondary | ICD-10-CM | POA: Diagnosis not present

## 2016-05-14 LAB — TYPE AND SCREEN
ABO/RH(D): A POS
Antibody Screen: NEGATIVE

## 2016-05-14 LAB — COMPREHENSIVE METABOLIC PANEL
ALBUMIN: 3.5 g/dL (ref 3.5–5.0)
ALK PHOS: 118 U/L (ref 38–126)
ALT: 17 U/L (ref 17–63)
AST: 26 U/L (ref 15–41)
Anion gap: 6 (ref 5–15)
BUN: 10 mg/dL (ref 6–20)
CALCIUM: 9.2 mg/dL (ref 8.9–10.3)
CHLORIDE: 109 mmol/L (ref 101–111)
CO2: 25 mmol/L (ref 22–32)
CREATININE: 0.75 mg/dL (ref 0.61–1.24)
GFR calc Af Amer: >60 mL/min (ref >60)
GFR calc non Af Amer: >60 mL/min (ref >60)
GLUCOSE: 108 mg/dL — AB (ref 65–99)
Potassium: 4.6 mmol/L (ref 3.5–5.1)
SODIUM: 140 mmol/L (ref 135–145)
Total Bilirubin: 1.3 mg/dL — ABNORMAL HIGH (ref 0.3–1.2)
Total Protein: 6.6 g/dL (ref 6.5–8.1)

## 2016-05-14 LAB — CBC WITH DIFFERENTIAL/PLATELET
BASOS PCT: 1 %
Basophils Absolute: 0.1 10*3/uL (ref 0.0–0.1)
EOS ABS: 0.5 10*3/uL (ref 0.0–0.7)
Eosinophils Relative: 6 %
HEMATOCRIT: 42.8 % (ref 39.0–52.0)
Hemoglobin: 14.1 g/dL (ref 13.0–17.0)
LYMPHS ABS: 1.9 10*3/uL (ref 0.7–4.0)
Lymphocytes Relative: 22 %
MCH: 31.9 pg (ref 26.0–34.0)
MCHC: 32.9 g/dL (ref 30.0–36.0)
MCV: 96.8 fL (ref 78.0–100.0)
MONO ABS: 0.9 10*3/uL (ref 0.1–1.0)
MONOS PCT: 11 %
NEUTROS ABS: 5.1 10*3/uL (ref 1.7–7.7)
Neutrophils Relative %: 60 %
Platelets: 235 10*3/uL (ref 150–400)
RBC: 4.42 MIL/uL (ref 4.22–5.81)
RDW: 14.3 % (ref 11.5–15.5)
WBC: 8.5 10*3/uL (ref 4.0–10.5)

## 2016-05-14 LAB — URINALYSIS, ROUTINE W REFLEX MICROSCOPIC
Bilirubin Urine: NEGATIVE
GLUCOSE, UA: NEGATIVE mg/dL
Ketones, ur: NEGATIVE mg/dL
Nitrite: NEGATIVE
PH: 7.5 (ref 5.0–8.0)
Protein, ur: NEGATIVE mg/dL
SPECIFIC GRAVITY, URINE: 1.005 (ref 1.005–1.030)

## 2016-05-14 LAB — I-STAT TROPONIN, ED: Troponin i, poc: 0.01 ng/mL (ref 0.00–0.08)

## 2016-05-14 LAB — URINE MICROSCOPIC-ADD ON

## 2016-05-14 LAB — POC OCCULT BLOOD, ED: FECAL OCCULT BLD: NEGATIVE

## 2016-05-14 MED ORDER — CEPHALEXIN 500 MG PO CAPS: 500.0000 mg | ORAL_CAPSULE | Freq: Two times a day (BID) | ORAL | Status: DC

## 2016-05-14 MED ORDER — DEXTROSE 5 % IV SOLN
1.0000 g | Freq: Once | INTRAVENOUS | Status: AC
  Administered 2016-05-14: 1 g via INTRAVENOUS
  Filled 2016-05-14: qty 10

## 2016-05-14 NOTE — Telephone Encounter (Signed)
[facility] - Client TELEPHONE ADVICE RECORD [facility]  Patient Name: Dwayne [name]  DOB: 1932-11-26    Initial Comment Caller states husband woke up with diarrhea and weak.   Nurse Assessment  Nurse: Dorthula RuePatten, RN, Enrique SackKendra Date/Time (Eastern Time): 05/14/2016 9:01:23 AM  Confirm and document reason for call. If symptomatic, describe symptoms. You must click the next button to save text entered. ---Caller states her husband woke up with runny black diarrhea. Wife states he is weak.  Has the patient traveled out of the country within the last 30 days? ---No  Does the patient have any new or worsening symptoms? ---Yes  Will a triage be completed? ---Yes  Related visit to physician within the last 2 weeks? ---No  Does the PT have any chronic conditions? (i.e. diabetes, asthma, etc.) ---Yes  List chronic conditions. ---Pacemaker, Dementia, On a blood thinner  Is this a behavioral health or substance abuse call? ---No     Guidelines    Guideline Title Affirmed Question Affirmed Notes  Diarrhea Shock suspected (e.g., cold/pale/clammy skin, too weak to stand, low BP, rapid pulse)    Final Disposition User   Call EMS 911 Now Dorthula RuePatten, RN, Enrique SackKendra    Referrals  GO TO FACILITY UNDECIDED   Disagree/Comply: Danella Maiersomply

## 2016-05-14 NOTE — ED Notes (Signed)
Pt. BIB [facility] for evaluation of weakness this AM. Pt. Wife reports 1 episode of black tarry stool. Reports has happened once in past due to coumadin. Pt. No longer taking coumadin. EMS reports pt. Was able to ambulate with walker to bathroom prior to leaving home with no issues. Pt. Has hx of mild dementia. Lives at home with wife.

## 2016-05-14 NOTE — ED Notes (Signed)
Pt walked with walker without assistance , no problems

## 2016-05-14 NOTE — Discharge Instructions (Signed)
Urinary Tract Infection °Urinary tract infections (UTIs) can develop anywhere along your urinary tract. Your urinary tract is your body's drainage system for removing wastes and extra water. Your urinary tract includes two kidneys, two ureters, a bladder, and a urethra. Your kidneys are a pair of bean-shaped organs. Each kidney is about the size of your fist. They are located below your ribs, one on each side of your spine. °CAUSES °Infections are caused by microbes, which are microscopic organisms, including fungi, viruses, and bacteria. These organisms are so small that they can only be seen through a microscope. Bacteria are the microbes that most commonly cause UTIs. °SYMPTOMS  °Symptoms of UTIs may vary by age and gender of the patient and by the location of the infection. Symptoms in young women typically include a frequent and intense urge to urinate and a painful, burning feeling in the bladder or urethra during urination. Older women and men are more likely to be tired, shaky, and weak and have muscle aches and abdominal pain. A fever may mean the infection is in your kidneys. Other symptoms of a kidney infection include pain in your back or sides below the ribs, nausea, and vomiting. °DIAGNOSIS °To diagnose a UTI, your caregiver will ask you about your symptoms. Your caregiver will also ask you to provide a urine sample. The urine sample will be tested for bacteria and white blood cells. White blood cells are made by your body to help fight infection. °TREATMENT  °Typically, UTIs can be treated with medication. Because most UTIs are caused by a bacterial infection, they usually can be treated with the use of antibiotics. The choice of antibiotic and length of treatment depend on your symptoms and the type of bacteria causing your infection. °HOME CARE INSTRUCTIONS °· If you were prescribed antibiotics, take them exactly as your caregiver instructs you. Finish the medication even if you feel better after  you have only taken some of the medication. °· Drink enough water and fluids to keep your urine clear or pale yellow. °· Avoid caffeine, tea, and carbonated beverages. They tend to irritate your bladder. °· Empty your bladder often. Avoid holding urine for long periods of time. °· Empty your bladder before and after sexual intercourse. °· After a bowel movement, women should cleanse from front to back. Use each tissue only once. °SEEK MEDICAL CARE IF:  °· You have back pain. °· You develop a fever. °· Your symptoms do not begin to resolve within 3 days. °SEEK IMMEDIATE MEDICAL CARE IF:  °· You have severe back pain or lower abdominal pain. °· You develop chills. °· You have nausea or vomiting. °· You have continued burning or discomfort with urination. °MAKE SURE YOU:  °· Understand these instructions. °· Will watch your condition. °· Will get help right away if you are not doing well or get worse. °  °This information is not intended to replace advice given to you by your health care provider. Make sure you discuss any questions you have with your health care provider. °  °Document Released: 09/15/2005 Document Revised: 08/27/2015 Document Reviewed: 01/14/2012 °Elsevier Interactive Patient Education ©2016 [employer] ° °Weakness °Weakness is a lack of strength. It may be felt all over the body (generalized) or in one specific part of the body (focal). Some causes of weakness can be serious. You may need further medical evaluation, especially if you are elderly or you have a history of immunosuppression (such as chemotherapy or HIV), kidney disease, heart disease, or   diabetes. °CAUSES  °Weakness can be caused by many different things, including: °· Infection. °· Physical exhaustion. °· Internal bleeding or other blood loss that results in a lack of red blood cells (anemia). °· Dehydration. This cause is more common in elderly people. °· Side effects or electrolyte abnormalities from medicines, such as pain  medicines or sedatives. °· Emotional distress, anxiety, or depression. °· Circulation problems, especially severe peripheral arterial disease. °· Heart disease, such as rapid atrial fibrillation, bradycardia, or heart failure. °· Nervous system disorders, such as Guillain-Barré syndrome, multiple sclerosis, or stroke. °DIAGNOSIS  °To find the cause of your weakness, your caregiver will take your history and perform a physical exam. Lab tests or X-rays may also be ordered, if needed. °TREATMENT  °Treatment of weakness depends on the cause of your symptoms and can vary greatly. °HOME CARE INSTRUCTIONS  °· Rest as needed. °· Eat a well-balanced diet. °· Try to get some exercise every day. °· Only take over-the-counter or prescription medicines as directed by your caregiver. °SEEK MEDICAL CARE IF:  °· Your weakness seems to be getting worse or spreads to other parts of your body. °· You develop new aches or pains. °SEEK IMMEDIATE MEDICAL CARE IF:  °· You cannot perform your normal daily activities, such as getting dressed and feeding yourself. °· You cannot walk up and down stairs, or you feel exhausted when you do so. °· You have shortness of breath or chest pain. °· You have difficulty moving parts of your body. °· You have weakness in only one area of the body or on only one side of the body. °· You have a fever. °· You have trouble speaking or swallowing. °· You cannot control your bladder or bowel movements. °· You have black or bloody vomit or stools. °MAKE SURE YOU: °· Understand these instructions. °· Will watch your condition. °· Will get help right away if you are not doing well or get worse. °  °This information is not intended to replace advice given to you by your health care provider. Make sure you discuss any questions you have with your health care provider. °  °Document Released: 12/06/2005 Document Revised: 06/06/2012 Document Reviewed: 02/04/2012 °Elsevier Interactive Patient Education ©2016 [employer] ° °

## 2016-05-14 NOTE — ED Notes (Signed)
Pt drinking water and eating cracker without issue

## 2016-05-14 NOTE — ED Provider Notes (Signed)
CSN: [id number]     Arrival date & time 05/14/16  1010 History   First MD Initiated Contact with Patient 05/14/16 1011     Chief Complaint  Patient presents with  . Weakness     Patient is a 82 y.o. male presenting with weakness. The history is provided by the patient and the EMS personnel. No language interpreter was used.  Weakness   Dwayne [name] is a 82 y.o. male who presents to the Emergency Department complaining of weakness.  Level V caveat due to confusion/dementia.   Per report patient presents for evaluation of weakness with one episode of black tarry stool at home. Patient has no complaints in the emergency department.  He lives at home with his wife.  His wife reports that he expressed feeling poorly this morning with one episode of diarrhea and appeared to be generally weak. The patient does take Eliquis for atrial fibrillation.  Past Medical History  Diagnosis Date  . Atrial fibrillation (HCC)     a. Was on Coumadin until GIB 01/2014.  [name] Kitchen. Hyperlipidemia   . Bronchiectasis without acute exacerbation (HCC)   . Disorders of diaphragm   . Plantar fascial fibromatosis   . Epistaxis   . Unspecified sinusitis (chronic)   . Allergic rhinitis, cause unspecified   . Spinal stenosis, unspecified region other than cervical   . Hypertension   . Coronary artery disease     a. s/p CABG 1993. b. s/p PCI of SVG to OM '03.  . Tachy-brady syndrome [facility](HCC)     s/p Medtronic PPM '07  . Myocardial infarction (HCC)   . GERD (gastroesophageal reflux disease)   . Arthritis   . H/O: GI bleed     a. 01/2014 - presumed diverticular. Received PRBC, Vit K. Coumadin put on hold.  . Diverticulosis   . LV dysfunction     a. Echo 08/2013: EF 45-50%.   Past Surgical History  Procedure Laterality Date  . Coronary artery bypass graft      LIMA to LAD, SVG to OM, SVG to left circumflex, SVG to PD/PLSA  . Pacemaker insertion      2007, Medtronic dual-chamber  . Kidney cyst removal    . Doppler  echocardiography  2011  . Cholecystectomy    . Vertebroplasty    . Cataract extraction w/ intraocular lens  implant, bilateral    . Hip arthroplasty Right 12/10/2013    Procedure: Right Hip Cemented Hemiarthroplasty;  Surgeon: Eldred MangesMark C Yates, MD;  [facility];  Service: Orthopedics;  Laterality: Right;  Right Hip Cemented Hemiarthroplasty  . Colonoscopy    . Permanent pacemaker generator change N/A 08/14/2014    Procedure: PERMANENT PACEMAKER GENERATOR CHANGE;  Surgeon: Marinus MawGregg W Taylor, MD;  [facility];  Service: Cardiovascular;  Laterality: N/A;   Family History  Problem Relation Age of Onset  . Colon cancer Neg Hx   . Heart disease Neg Hx   . Stroke Mother   . Throat cancer Neg Hx   . Pancreatic cancer Neg Hx   . Diabetes Son   . Kidney disease Neg Hx   . Liver disease Neg Hx   . Osteoporosis Neg Hx    Social History  Substance Use Topics  . Smoking status: Former Games developermoker  . Smokeless tobacco: Never Used     Comment: x 6 months in high school  . Alcohol Use: No     Comment: none    Review of Systems  Neurological: Positive  for weakness.  All other systems reviewed and are negative.     Allergies  Asparaginase derivatives; Doxycycline hyclate; Erythromycin; Tetracycline; Ambien; Caffeine; Clindamycin; Lactose intolerance (gi); Lyrica; Metronidazole; Nitrostat; Penicillins; and Sulfonamide derivatives  Home Medications   Prior to Admission medications   Medication Sig Start Date End Date Taking? Authorizing Provider  atorvastatin (LIPITOR) 40 MG tablet Take 1 tablet (40 mg total) by mouth daily. 01/01/16  Yes [name], MD  Biotin 1000 MCG tablet Take 1,000 mcg by mouth daily.   Yes Historical Provider, MD  Calcium-Magnesium-Vitamin D (CITRACAL CALCIUM+D) 600-40-500 MG-MG-UNIT TB24 Take 2 tablets by mouth daily.    Yes Historical Provider, MD  Doxepin HCl (SILENOR) 6 MG TABS Take 1 tablet (6 mg total) by mouth at bedtime as needed. Patient taking  differently: Take 1 tablet by mouth at bedtime.  03/24/16  Yes [name], MD  ELIQUIS 5 MG TABS tablet Take 1 tablet by mouth two  times daily 01/23/16  Yes [name], MD  ferrous sulfate 325 (65 FE) MG tablet Take 1 tablet (325 mg total) by mouth 2 (two) times daily with a meal. Patient taking differently: Take 325 mg by mouth daily at 12 noon.  02/21/14  Yes [name], MD  fexofenadine (ALLEGRA) 180 MG tablet Take 180 mg by mouth at bedtime as needed (for allergies).    Yes Historical Provider, MD  finasteride (PROSCAR) 5 MG tablet Take 1 tablet by mouth at  bedtime 12/31/15  Yes [name], MD  Memantine HCl-Donepezil HCl [facility]) 7 & 14 & 21 &28 -10 MG C4PK Take 1 tablet by mouth daily. 05/06/16  Yes [name], MD  metoprolol tartrate (LOPRESSOR) 25 MG tablet Take 1 tablet by mouth  twice a day 11/18/15  Yes [name], MD  Multiple Vitamins-Minerals (BIO-35 GLUTEN-FREE PO) Take 1 tablet by mouth daily at 12 noon.   Yes Historical Provider, MD  Multiple Vitamins-Minerals (OCUVITE-LUTEIN PO) Take 1 tablet by mouth daily.    Yes Historical Provider, MD  omeprazole (PRILOSEC) 40 MG capsule Take 40 mg by mouth daily.     Yes Historical Provider, MD  polyethylene glycol (MIRALAX / GLYCOLAX) packet Take 17 g by mouth daily as needed for moderate constipation.    Yes Historical Provider, MD  terazosin (HYTRIN) 5 MG capsule Take 1 capsule by mouth  daily 12/31/15  Yes [name], MD  cephALEXin (KEFLEX) 500 MG capsule Take 1 capsule (500 mg total) by mouth 2 (two) times daily. 05/14/16   Tilden Fossa, MD  cyanocobalamin (,VITAMIN B-12,) 1000 MCG/ML injection Inject 1,000 mcg into the muscle every 21 ( twenty-one) days.    Historical Provider, MD  nitroGLYCERIN (NITROSTAT) 0.4 MG SL tablet Place 1 tablet (0.4 mg total) under the tongue every 5 (five) minutes x 3 doses as needed for chest pain. 01/02/15   [name], MD  PROLIA 60 MG/ML SOLN injection Inject 60 mg into  the skin every 6 (six) months.  10/04/12   Historical Provider, MD   BP 130/82 mmHg  Pulse 61  Temp(Src) 97.7 F (36.5 C) (Oral)  Resp 24  SpO2 96% Physical Exam  Constitutional: He appears well-developed and well-nourished.  HENT:  Head: Normocephalic and atraumatic.  Cardiovascular: Normal rate and regular rhythm.   No murmur heard. Pulmonary/Chest: Effort normal and breath sounds normal. No respiratory distress.  Abdominal: Soft. There is no tenderness. There is no rebound and no guarding.  Genitourinary:  Nontender rectal exam with small amount of black stool  Musculoskeletal: He exhibits no edema or tenderness.  Neurological: He is alert.  Disoriented to place and time. Pleasantly confused. Mild generalized weakness.  Skin: Skin is warm and dry.  Psychiatric: He has a normal mood and affect. His behavior is normal.  Nursing note and vitals reviewed.   ED Course  Procedures (including critical care time) Labs Review Labs Reviewed  COMPREHENSIVE METABOLIC PANEL - Abnormal; Notable for the following:    Glucose, Bld 108 (*)    Total Bilirubin 1.3 (*)    All other components within normal limits  URINALYSIS, ROUTINE W REFLEX MICROSCOPIC (NOT AT [facility]) - Abnormal; Notable for the following:    APPearance CLOUDY (*)    Hgb urine dipstick SMALL (*)    Leukocytes, UA LARGE (*)    All other components within normal limits  URINE MICROSCOPIC-ADD ON - Abnormal; Notable for the following:    Squamous Epithelial / LPF 0-5 (*)    Bacteria, UA RARE (*)    All other components within normal limits  URINE CULTURE  CBC WITH DIFFERENTIAL/PLATELET  POC OCCULT BLOOD, ED  I-STAT TROPOININ, ED  TYPE AND SCREEN    Imaging Review No results found. I have personally reviewed and evaluated these images and lab results as part of my medical decision-making.   EKG Interpretation   Date/Time:  Friday May 14 2016 10:18:24 EDT Ventricular Rate:  66 PR Interval:    QRS Duration:  175 QT Interval:  488 QTC Calculation: 511 R Axis:   -56 Text Interpretation:  Electronic ventricular pacemaker Paired ventricular  premature complexes Confirmed by Lincoln Brigham [phone]) on 05/14/2016 10:35:03  AM      MDM   Final diagnoses:  Acute UTI  Patient here for evaluation of black and watery stool 1 at home, weakness. In the emergency department he is at his baseline with no complaints and able to ambulate without difficulty. Examination demonstrates black stool that is Hemoccult negative. UA is concerning for developing urinary tract infection. Will treat with this of antibiotics and send urine cultures. CBC is without anemia. BMP similar compared to priors. Discussed with wife finding of studies and that currently a GI bleed is not suspected. Discussed urinary tract infection, home care, return precautions.    Tilden Fossa, MD 05/14/16 1432

## 2016-05-16 LAB — URINE CULTURE: Culture: >=100000 — AB

## 2016-05-17 ENCOUNTER — Telehealth (HOSPITAL_BASED_OUTPATIENT_CLINIC_OR_DEPARTMENT_OTHER): Payer: Self-pay | Admitting: Emergency Medicine

## 2016-05-17 NOTE — Telephone Encounter (Signed)
Post ED Visit - Positive Culture Follow-up  Culture report reviewed by antimicrobial stewardship pharmacist:  []  Dwayne [name], Pharm.D. []  Dwayne [name], Pharm.D., Dwayne [name] []  Dwayne [name], Pharm.D. []  Dwayne [name], Pharm.D., Dwayne [name] [x]  Dwayne [name], Dwayne Rainbow BoulevardPharm.D., Dwayne [name], Dwayne [name] []  Dwayne [name], Pharm.D., Dwayne [name], Dwayne [name] []  Dwayne [name], Pharm.D. []  Dwayne [name], Dwayne Rainbow BoulevardPharm.D.  Positive urine culture Treated with cephalexin, organism sensitive to the same and no further patient follow-up is required at this time.  Dwayne [name], Dwayne [name] 05/17/2016, 9:34 AM

## 2016-05-18 ENCOUNTER — Ambulatory Visit (INDEPENDENT_AMBULATORY_CARE_PROVIDER_SITE_OTHER): Payer: Medicare Other | Admitting: *Deleted

## 2016-05-18 DIAGNOSIS — I495 Sick sinus syndrome: Secondary | ICD-10-CM | POA: Diagnosis not present

## 2016-05-19 NOTE — Progress Notes (Signed)
Remote pacemaker transmission.   

## 2016-05-24 ENCOUNTER — Encounter: Payer: Self-pay | Admitting: Internal Medicine

## 2016-05-24 ENCOUNTER — Ambulatory Visit (INDEPENDENT_AMBULATORY_CARE_PROVIDER_SITE_OTHER): Payer: Medicare Other | Admitting: Internal Medicine

## 2016-05-24 VITALS — BP 110/80 | HR 80 | Temp 98.0°F | Resp 16 | Ht 71.0 in | Wt 167.0 lb

## 2016-05-24 DIAGNOSIS — N390 Urinary tract infection, site not specified: Secondary | ICD-10-CM

## 2016-05-24 DIAGNOSIS — I2584 Coronary atherosclerosis due to calcified coronary lesion: Secondary | ICD-10-CM | POA: Diagnosis not present

## 2016-05-24 DIAGNOSIS — B9620 Unspecified Escherichia coli [E. coli] as the cause of diseases classified elsewhere: Secondary | ICD-10-CM | POA: Diagnosis not present

## 2016-05-24 DIAGNOSIS — I2510 Atherosclerotic heart disease of native coronary artery without angina pectoris: Secondary | ICD-10-CM

## 2016-05-24 DIAGNOSIS — Z0000 Encounter for general adult medical examination without abnormal findings: Secondary | ICD-10-CM | POA: Diagnosis not present

## 2016-05-24 LAB — POCT URINALYSIS DIPSTICK
Bilirubin, UA: NEGATIVE
Glucose, UA: NEGATIVE
Ketones, UA: NEGATIVE
Leukocytes, UA: NEGATIVE
Nitrite, UA: NEGATIVE
PH UA: 6
PROTEIN UA: NEGATIVE
RBC UA: NEGATIVE
SPEC GRAV UA: 1.02
UROBILINOGEN UA: 0.2

## 2016-05-24 NOTE — Patient Instructions (Signed)

## 2016-05-24 NOTE — Progress Notes (Signed)
Pre visit review using our clinic review tool, if applicable. No additional management support is needed unless otherwise documented below in the visit note. 

## 2016-05-24 NOTE — Progress Notes (Signed)
Subjective:  Patient ID: Dwayne [name], male    DOB: 18-Aug-1932  Age: 82 y.o. MRN: [id number]  CC: Urinary Tract Infection   HPI [name] presents for follow-up after a recent visit to the emergency room for dysuria and fatigue. He was found to have an abnormal urinalysis consistent with a urinary tract infection. His urine culture was positive for greater than 100,000 colonies of Escherichia coli that was resistant to Cipro but was sensitive to everything else. He was then treated with a course of Keflex and has had an excellent response. He denies dysuria, hematuria, abdominal pain, fatigue, nausea, vomiting, fever, or chills. He has tolerated Keflex well with no rash or diarrhea.  Outpatient Prescriptions Prior to Visit  Medication Sig Dispense Refill  . atorvastatin (LIPITOR) 40 MG tablet Take 1 tablet (40 mg total) by mouth daily. 90 tablet 2  . Biotin 1000 MCG tablet Take 1,000 mcg by mouth daily.    . Calcium-Magnesium-Vitamin D (CITRACAL CALCIUM+D) 600-40-500 MG-MG-UNIT TB24 Take 2 tablets by mouth daily.     . cyanocobalamin (,VITAMIN B-12,) 1000 MCG/ML injection Inject 1,000 mcg into the muscle every 21 ( twenty-one) days.    . Doxepin HCl (SILENOR) 6 MG TABS Take 1 tablet (6 mg total) by mouth at bedtime as needed. (Patient taking differently: Take 1 tablet by mouth at bedtime. ) 90 tablet 3  . ELIQUIS 5 MG TABS tablet Take 1 tablet by mouth two  times daily 180 tablet 0  . ferrous sulfate 325 (65 FE) MG tablet Take 1 tablet (325 mg total) by mouth 2 (two) times daily with a meal. (Patient taking differently: Take 325 mg by mouth daily at 12 noon. ) 180 tablet 1  . fexofenadine (ALLEGRA) 180 MG tablet Take 180 mg by mouth at bedtime as needed (for allergies).     . finasteride (PROSCAR) 5 MG tablet Take 1 tablet by mouth at  bedtime 90 tablet 3  . Memantine HCl-Donepezil HCl (NAMZARIC) 7 & 14 & 21 &28 -10 MG C4PK Take 1 tablet by mouth daily. 28 each 0  . metoprolol tartrate  (LOPRESSOR) 25 MG tablet Take 1 tablet by mouth  twice a day 180 tablet 2  . Multiple Vitamins-Minerals (BIO-35 GLUTEN-FREE PO) Take 1 tablet by mouth daily at 12 noon.    . Multiple Vitamins-Minerals (OCUVITE-LUTEIN PO) Take 1 tablet by mouth daily.     . nitroGLYCERIN (NITROSTAT) 0.4 MG SL tablet Place 1 tablet (0.4 mg total) under the tongue every 5 (five) minutes x 3 doses as needed for chest pain. 25 tablet 1  . omeprazole (PRILOSEC) 40 MG capsule Take 40 mg by mouth daily.      . polyethylene glycol (MIRALAX / GLYCOLAX) packet Take 17 g by mouth daily as needed for moderate constipation.     [name] Kitchen PROLIA 60 MG/ML SOLN injection Inject 60 mg into the skin every 6 (six) months.     . terazosin (HYTRIN) 5 MG capsule Take 1 capsule by mouth  daily 90 capsule 3  . cephALEXin (KEFLEX) 500 MG capsule Take 1 capsule (500 mg total) by mouth 2 (two) times daily. 28 capsule 0   No facility-administered medications prior to visit.    ROS Review of Systems  Constitutional: Negative.  Negative for fever, chills, diaphoresis, appetite change and fatigue.  HENT: Negative.   Eyes: Negative.   Respiratory: Negative.  Negative for cough, choking, chest tightness, shortness of breath and stridor.   Cardiovascular: Negative.  Negative for chest pain, palpitations and leg swelling.  Gastrointestinal: Negative.  Negative for nausea, vomiting, abdominal pain, diarrhea and constipation.  Endocrine: Negative.   Genitourinary: Negative.  Negative for dysuria, urgency, frequency, hematuria, flank pain, decreased urine volume, enuresis, difficulty urinating and testicular pain.  Musculoskeletal: Negative.  Negative for myalgias, back pain, joint swelling and arthralgias.  Skin: Negative.  Negative for color change and rash.  Allergic/Immunologic: Negative.   Neurological: Negative.  Negative for dizziness.  Hematological: Negative.  Negative for adenopathy. Does not bruise/bleed easily.  Psychiatric/Behavioral:  Negative.     Objective:  BP 110/80 mmHg  Pulse 80  Temp(Src) 98 F (36.7 C) (Oral)  Resp 16  Ht 5\' 11"  (1.803 m)  Wt 167 lb (75.751 kg)  BMI 23.30 kg/m2  SpO2 96%  BP Readings from Last 3 Encounters:  05/24/16 110/80  05/14/16 130/82  05/06/16 108/80    Wt Readings from Last 3 Encounters:  05/24/16 167 lb (75.751 kg)  05/06/16 167 lb (75.751 kg)  02/02/16 171 lb (77.565 kg)   Results for Dola FactorKING, [name] (MRN [id number]) as of 05/24/2016 10:25  Ref. Range 05/14/2016 10:46  Appearance Latest Ref Range: CLEAR  CLOUDY (A)  Bacteria, UA Latest Ref Range: NONE SEEN  RARE (A)  Bilirubin Urine Latest Ref Range: NEGATIVE  NEGATIVE  Color, Urine Latest Ref Range: YELLOW  YELLOW  Glucose Latest Ref Range: NEGATIVE mg/dL NEGATIVE  Hgb urine dipstick Latest Ref Range: NEGATIVE  SMALL (A)  Ketones, ur Latest Ref Range: NEGATIVE mg/dL NEGATIVE  Leukocytes, UA Latest Ref Range: NEGATIVE  LARGE (A)  Nitrite Latest Ref Range: NEGATIVE  NEGATIVE  pH Latest Ref Range: 5.0-8.0  7.5  Protein Latest Ref Range: NEGATIVE mg/dL NEGATIVE  RBC / HPF Latest Ref Range: 0-5 RBC/hpf 0-5  Specific Gravity, Urine Latest Ref Range: 1.005-1.030  1.005  Squamous Epithelial / LPF Latest Ref Range: NONE SEEN  0-5 (A)  WBC, UA Latest Ref Range: 0-5 WBC/hpf TOO NUMEROUS TO C...     Physical Exam  Constitutional: He is oriented to person, place, and time.  Non-toxic appearance. He does not have a sickly appearance. He does not appear ill. No distress.  HENT:  Mouth/Throat: Oropharynx is clear and moist. No oropharyngeal exudate.  Eyes: Conjunctivae are normal. Right eye exhibits no discharge. Left eye exhibits no discharge. No scleral icterus.  Neck: Normal range of motion. Neck supple. No JVD present. No tracheal deviation present. No thyromegaly present.  Cardiovascular: Normal rate, regular rhythm, normal heart sounds and intact distal pulses.  Exam reveals no gallop and no friction rub.   No murmur  heard. Pulmonary/Chest: Effort normal and breath sounds normal. No stridor. No respiratory distress. He has no wheezes. He has no rales. He exhibits no tenderness.  Abdominal: Soft. Bowel sounds are normal. He exhibits no distension and no mass. There is no tenderness. There is no rebound and no guarding.  Genitourinary:  GU exam was deferred at his request  Musculoskeletal: Normal range of motion. He exhibits no edema or tenderness.  Lymphadenopathy:    He has no cervical adenopathy.  Neurological: He is oriented to person, place, and time.  Skin: Skin is warm and dry. No rash noted. He is not diaphoretic. No erythema. No pallor.  Vitals reviewed.   Lab Results  Component Value Date   WBC 8.5 05/14/2016   HGB 14.1 05/14/2016   HCT 42.8 05/14/2016   PLT 235 05/14/2016   GLUCOSE 108* 05/14/2016   CHOL 138  02/02/2016   TRIG 70 02/02/2016   HDL 44 02/02/2016   LDLCALC 80 02/02/2016   ALT 17 05/14/2016   AST 26 05/14/2016   NA 140 05/14/2016   K 4.6 05/14/2016   CL 109 05/14/2016   CREATININE 0.75 05/14/2016   BUN 10 05/14/2016   CO2 25 05/14/2016   TSH 1.30 03/11/2015   PSA 3.29 02/07/2015   INR 1.4* 02/21/2014   HGBA1C 5.9 03/11/2015    No results found.  Assessment & Plan:   Ryane was seen today for urinary tract infection.  Diagnoses and all orders for this visit:  Preventative health care -     POCT urinalysis dipstick  E. coli UTI (urinary tract infection)- his symptoms have resolved and his urinalysis is normal today, he has completed the Keflex, if he has any recurrence of symptoms he will let me know and I will offer an extended course of an appropriate antibiotic.   I have discontinued Mr. Schlageter cephALEXin. I am also having him maintain his fexofenadine, omeprazole, Calcium-Magnesium-Vitamin D, polyethylene glycol, PROLIA, Multiple Vitamins-Minerals (OCUVITE-LUTEIN PO), ferrous sulfate, cyanocobalamin, nitroGLYCERIN, metoprolol tartrate, terazosin,  finasteride, atorvastatin, ELIQUIS, Doxepin HCl, Memantine HCl-Donepezil HCl, Multiple Vitamins-Minerals (BIO-35 GLUTEN-FREE PO), and Biotin.  No orders of the defined types were placed in this encounter.     Follow-up: Return if symptoms worsen or fail to improve.  Sanda Linger, MD

## 2016-05-27 ENCOUNTER — Ambulatory Visit (INDEPENDENT_AMBULATORY_CARE_PROVIDER_SITE_OTHER): Payer: Medicare Other

## 2016-05-27 DIAGNOSIS — E538 Deficiency of other specified B group vitamins: Secondary | ICD-10-CM

## 2016-05-27 MED ORDER — CYANOCOBALAMIN 1000 MCG/ML IJ SOLN
1000.0000 ug | Freq: Once | INTRAMUSCULAR | Status: AC
  Administered 2016-05-27: 1000 ug via INTRAMUSCULAR

## 2016-06-01 ENCOUNTER — Telehealth: Payer: Self-pay | Admitting: *Deleted

## 2016-06-01 DIAGNOSIS — F0390 Unspecified dementia without behavioral disturbance: Secondary | ICD-10-CM

## 2016-06-01 LAB — CUP PACEART REMOTE DEVICE CHECK
Battery Remaining Longevity: 129 mo
Brady Statistic RV Percent Paced: 98 %
Date Time Interrogation Session: 20170530151513
Implantable Lead Implant Date: 20070905
Implantable Lead Location: 753859
Implantable Lead Location: 753860
Implantable Lead Model: 5076
Implantable Lead Model: 5076
Lead Channel Impedance Value: 501 Ohm
MDC IDC LEAD IMPLANT DT: 20070905
MDC IDC MSMT BATTERY IMPEDANCE: 153 Ohm
MDC IDC MSMT BATTERY VOLTAGE: 2.78 V
MDC IDC MSMT LEADCHNL RA IMPEDANCE VALUE: 67 Ohm
MDC IDC SET LEADCHNL RV PACING AMPLITUDE: 2 V
MDC IDC SET LEADCHNL RV PACING PULSEWIDTH: 0.4 ms
MDC IDC SET LEADCHNL RV SENSING SENSITIVITY: 4 mV

## 2016-06-01 MED ORDER — MEMANTINE HCL-DONEPEZIL HCL 7 & 14 & 21 &28 -10 MG PO C4PK: 1.0000 | EXTENDED_RELEASE_CAPSULE | Freq: Every day | ORAL | Status: DC

## 2016-06-01 NOTE — Telephone Encounter (Signed)
Receive call pt states MD gave him a sample & discount card for the Namenzac he is [facility]. Inform pt will send...Raechel Chute/lmb

## 2016-06-02 ENCOUNTER — Other Ambulatory Visit: Payer: Self-pay | Admitting: Cardiology

## 2016-06-08 ENCOUNTER — Encounter: Payer: Self-pay | Admitting: Cardiology

## 2016-06-08 ENCOUNTER — Ambulatory Visit: Payer: Medicare Other

## 2016-06-08 ENCOUNTER — Telehealth: Payer: Self-pay | Admitting: Internal Medicine

## 2016-06-08 NOTE — Telephone Encounter (Signed)
Patient walked in today to advise that they need patient assistance paperwork filled out to receive Doxepin HCl (SILENOR) 6 MG TABS [[id number]][[id number]]. He only has 4 pills left. He wants to know if there are any samples to give in the meantime.   They also wanted to know if they should wait until 07/04/207 to start the medicine for demensia

## 2016-06-09 ENCOUNTER — Telehealth: Payer: Self-pay | Admitting: Internal Medicine

## 2016-06-09 NOTE — Telephone Encounter (Signed)
States Namzaric is on back order.  Wanted to know if Dr. Yetta BarreJones wanted to write out specific directions for medication?

## 2016-06-09 NOTE — Telephone Encounter (Signed)
Asked if pt dropped off paperwork. They did not will print out form and mail it. Will instruct to return to office and this will be mailed or faxed

## 2016-06-10 NOTE — Telephone Encounter (Signed)
Notified pt w/MD response. Pt also wanting samples of silenor until PA has been approve...Raechel Chute/lmb

## 2016-06-10 NOTE — Telephone Encounter (Signed)
I have samples for 8 weeks

## 2016-06-11 NOTE — Telephone Encounter (Signed)
Mailed to pt. Informed to return to office and will fax

## 2016-06-17 ENCOUNTER — Ambulatory Visit (INDEPENDENT_AMBULATORY_CARE_PROVIDER_SITE_OTHER): Payer: Medicare Other

## 2016-06-17 DIAGNOSIS — E538 Deficiency of other specified B group vitamins: Secondary | ICD-10-CM | POA: Diagnosis not present

## 2016-06-17 MED ORDER — CYANOCOBALAMIN 1000 MCG/ML IJ SOLN
1000.0000 ug | Freq: Once | INTRAMUSCULAR | Status: AC
  Administered 2016-06-17: 1000 ug via INTRAMUSCULAR

## 2016-06-30 ENCOUNTER — Telehealth: Payer: Self-pay

## 2016-06-30 NOTE — Telephone Encounter (Signed)
PA initiated via CoverMyMeds key [id number]. [facility] [phone]

## 2016-06-30 NOTE — Telephone Encounter (Signed)
Pt and caregiver came up into office today and state that medication Doxepin needs a prior authorization.

## 2016-07-02 NOTE — Telephone Encounter (Signed)
PA APPROVED through 12/19/2016, spouse advised in detail

## 2016-07-12 DIAGNOSIS — M810 Age-related osteoporosis without current pathological fracture: Secondary | ICD-10-CM | POA: Diagnosis not present

## 2016-07-12 DIAGNOSIS — M5137 Other intervertebral disc degeneration, lumbosacral region: Secondary | ICD-10-CM | POA: Diagnosis not present

## 2016-07-12 DIAGNOSIS — M40294 Other kyphosis, thoracic region: Secondary | ICD-10-CM | POA: Diagnosis not present

## 2016-07-15 ENCOUNTER — Telehealth: Payer: Self-pay | Admitting: Internal Medicine

## 2016-07-15 ENCOUNTER — Telehealth: Payer: Self-pay | Admitting: Emergency Medicine

## 2016-07-15 NOTE — Telephone Encounter (Signed)
completed

## 2016-07-15 NOTE — Telephone Encounter (Signed)
Pt wants to know if he can get something for his back pain. Please follow up thanks.

## 2016-07-15 NOTE — Telephone Encounter (Signed)
Needs to be seen

## 2016-07-15 NOTE — Telephone Encounter (Signed)
I was not able to get a clear explanation. There is a language barrier with his wife. The only clear sentence I got was "He's had another fit"

## 2016-07-15 NOTE — Telephone Encounter (Signed)
What is wrong with his back?????

## 2016-07-15 NOTE — Telephone Encounter (Signed)
Patient Name: Dwayne [name] DOB: [birth year] Initial Comment Caller states patient saw MD Tuesday, is on eliquist, having severe backache, told he could not have pain meds due to eliquist. Wants to know if he can get anything for it. Nurse Assessment Nurse: Roma Kayser, RN, Santina Evans Date/Time (Eastern Time): 07/15/2016 12:46:48 PM Confirm and document reason for call. If symptomatic, describe symptoms. You must click the next button to save text entered. ---caller states back pain two days, no known injuries, states her wants narcotic medicine called in. 5/10 constant Has the patient traveled out of the country within the last 30 days? ---Not Applicable Does the patient have any new or worsening symptoms? ---Yes Will a triage be completed? ---Yes Related visit to physician within the last 2 weeks? ---No Does the PT have any chronic conditions? (i.e. diabetes, asthma, etc.) ---Yes List chronic conditions. ---High chol, htn, pacer Is this a behavioral health or substance abuse call? ---No Guidelines Guideline Title Affirmed Question Affirmed Notes Back Pain [1] SEVERE back pain (e.g., excruciating, unable to do any normal activities) AND [2] not improved 2 hours after pain medicine Final Disposition User See Physician within 4 Hours (or PCP triage) Roma Kayser, RN, Santina Evans Comments patient refused outcome and requests a call back, cant come in today. please call back with an appt for tomorrow morning Referrals REFERRED TO PCP OFFICE Disagree/Comply: Disagree Disagree/Comply Reason: Unable to find transportation

## 2016-07-16 ENCOUNTER — Encounter: Payer: Self-pay | Admitting: Family

## 2016-07-16 ENCOUNTER — Ambulatory Visit (INDEPENDENT_AMBULATORY_CARE_PROVIDER_SITE_OTHER): Payer: Medicare Other | Admitting: Family

## 2016-07-16 ENCOUNTER — Ambulatory Visit: Payer: Medicare Other

## 2016-07-16 DIAGNOSIS — I2584 Coronary atherosclerosis due to calcified coronary lesion: Secondary | ICD-10-CM

## 2016-07-16 DIAGNOSIS — S39012A Strain of muscle, fascia and tendon of lower back, initial encounter: Secondary | ICD-10-CM | POA: Diagnosis not present

## 2016-07-16 DIAGNOSIS — I2510 Atherosclerotic heart disease of native coronary artery without angina pectoris: Secondary | ICD-10-CM | POA: Diagnosis not present

## 2016-07-16 MED ORDER — TIZANIDINE HCL 2 MG PO TABS
1.0000 mg | ORAL_TABLET | Freq: Four times a day (QID) | ORAL | 0 refills | Status: DC | PRN
Start: 2016-07-16 — End: 2016-08-19

## 2016-07-16 NOTE — Patient Instructions (Addendum)
Thank you for choosing [facility].  Summary/Instructions:  Ice x 20 minutes every 2 hours as needed and following activity.   Alternate moist heat for about 20 minutes every 2 hours as needed.  Stretching daily.  Be cautious as the [place] and the [place] can increase risk of bleeding.   Tizanidine as needed for muscle spasm.   If your symptoms worsen or fail to improve, please contact our office for further instruction, or in case of emergency go directly to the emergency room at the closest medical facility.     Low Back Strain With Rehab A strain is an injury in which a tendon or muscle is torn. The muscles and tendons of the lower back are vulnerable to strains. However, these muscles and tendons are very strong and require a great force to be injured. Strains are classified into three categories. Grade 1 strains cause pain, but the tendon is not lengthened. Grade 2 strains include a lengthened ligament, due to the ligament being stretched or partially ruptured. With grade 2 strains there is still function, although the function may be decreased. Grade 3 strains involve a complete tear of the tendon or muscle, and function is usually impaired. SYMPTOMS   Pain in the lower back.  Pain that affects one side more than the other.  Pain that gets worse with movement and may be felt in the hip, buttocks, or back of the thigh.  Muscle spasms of the muscles in the back.  Swelling along the muscles of the back.  Loss of strength of the back muscles.  Crackling sound (crepitation) when the muscles are touched. CAUSES  Lower back strains occur when a force is placed on the muscles or tendons that is greater than they can handle. Common causes of injury include:  Prolonged overuse of the muscle-tendon units in the lower back, usually from incorrect posture.  A single violent injury or force applied to the back. RISK INCREASES WITH:  Sports that involve twisting forces on the  spine or a lot of bending at the waist (football, rugby, weightlifting, bowling, golf, tennis, speed skating, racquetball, swimming, running, gymnastics, diving).  Poor strength and flexibility.  Failure to warm up properly before activity.  Family history of lower back pain or disk disorders.  Previous back injury or surgery (especially fusion).  Poor posture with lifting, especially heavy objects.  Prolonged sitting, especially with poor posture. PREVENTION   Learn and use proper posture when sitting or lifting (maintain proper posture when sitting, lift using the knees and legs, not at the waist).  Warm up and stretch properly before activity.  Allow for adequate recovery between workouts.  Maintain physical fitness:  Strength, flexibility, and endurance.  Cardiovascular fitness. PROGNOSIS  If treated properly, lower back strains usually heal within 6 weeks. RELATED COMPLICATIONS   Recurring symptoms, resulting in a chronic problem.  Chronic inflammation, scarring, and partial muscle-tendon tear.  Delayed healing or resolution of symptoms.  Prolonged disability. TREATMENT  Treatment first involves the use of ice and medicine, to reduce pain and inflammation. The use of strengthening and stretching exercises may help reduce pain with activity. These exercises may be performed at home or with a therapist. Severe injuries may require referral to a therapist for further evaluation and treatment, such as ultrasound. Your caregiver may advise that you wear a back brace or corset, to help reduce pain and discomfort. Often, prolonged bed rest results in greater harm then benefit. Corticosteroid injections may be recommended. However, these  should be reserved for the most serious cases. It is important to avoid using your back when lifting objects. At night, sleep on your back on a firm mattress with a pillow placed under your knees. If non-surgical treatment is unsuccessful, surgery  may be needed.  MEDICATION   If pain medicine is needed, nonsteroidal anti-inflammatory medicines (aspirin and ibuprofen), or other minor pain relievers (acetaminophen), are often advised.  Do not take pain medicine for 7 days before surgery.  Prescription pain relievers may be given, if your caregiver thinks they are needed. Use only as directed and only as much as you need.  Ointments applied to the skin may be helpful.  Corticosteroid injections may be given by your caregiver. These injections should be reserved for the most serious cases, because they may only be given a certain number of times. HEAT AND COLD  Cold treatment (icing) should be applied for 10 to 15 minutes every 2 to 3 hours for inflammation and pain, and immediately after activity that aggravates your symptoms. Use ice packs or an ice massage.  Heat treatment may be used before performing stretching and strengthening activities prescribed by your caregiver, physical therapist, or athletic trainer. Use a heat pack or a warm water soak. SEEK MEDICAL CARE IF:   Symptoms get worse or do not improve in 2 to 4 weeks, despite treatment.  You develop numbness, weakness, or loss of bowel or bladder function.  New, unexplained symptoms develop. (Drugs used in treatment may produce side effects.) EXERCISES  RANGE OF MOTION (ROM) AND STRETCHING EXERCISES - Low Back Strain Most people with lower back pain will find that their symptoms get worse with excessive bending forward (flexion) or arching at the lower back (extension). The exercises which will help resolve your symptoms will focus on the opposite motion.  Your physician, physical therapist or athletic trainer will help you determine which exercises will be most helpful to resolve your lower back pain. Do not complete any exercises without first consulting with your caregiver. Discontinue any exercises which make your symptoms worse until you speak to your caregiver.  If you  have pain, numbness or tingling which travels down into your buttocks, leg or foot, the goal of the therapy is for these symptoms to move closer to your back and eventually resolve. Sometimes, these leg symptoms will get better, but your lower back pain may worsen. This is typically an indication of progress in your rehabilitation. Be very alert to any changes in your symptoms and the activities in which you participated in the 24 hours prior to the change. Sharing this information with your caregiver will allow him/her to most efficiently treat your condition.  These exercises may help you when beginning to rehabilitate your injury. Your symptoms may resolve with or without further involvement from your physician, physical therapist or athletic trainer. While completing these exercises, remember:  Restoring tissue flexibility helps normal motion to return to the joints. This allows healthier, less painful movement and activity.  An effective stretch should be held for at least 30 seconds.  A stretch should never be painful. You should only feel a gentle lengthening or release in the stretched tissue. FLEXION RANGE OF MOTION AND STRETCHING EXERCISES: STRETCH - Flexion, Single Knee to Chest   Lie on a firm bed or floor with both legs extended in front of you.  Keeping one leg in contact with the floor, bring your opposite knee to your chest. Hold your leg in place by either grabbing  behind your thigh or at your knee.  Pull until you feel a gentle stretch in your lower back. Hold __________ seconds.  Slowly release your grasp and repeat the exercise with the opposite side. Repeat __________ times. Complete this exercise __________ times per day.  STRETCH - Flexion, Double Knee to Chest   Lie on a firm bed or floor with both legs extended in front of you.  Keeping one leg in contact with the floor, bring your opposite knee to your chest.  Tense your stomach muscles to support your back and  then lift your other knee to your chest. Hold your legs in place by either grabbing behind your thighs or at your knees.  Pull both knees toward your chest until you feel a gentle stretch in your lower back. Hold __________ seconds.  Tense your stomach muscles and slowly return one leg at a time to the floor. Repeat __________ times. Complete this exercise __________ times per day.  STRETCH - Low Trunk Rotation  Lie on a firm bed or floor. Keeping your legs in front of you, bend your knees so they are both pointed toward the ceiling and your feet are flat on the floor.  Extend your arms out to the side. This will stabilize your upper body by keeping your shoulders in contact with the floor.  Gently and slowly drop both knees together to one side until you feel a gentle stretch in your lower back. Hold for __________ seconds.  Tense your stomach muscles to support your lower back as you bring your knees back to the starting position. Repeat the exercise to the other side. Repeat __________ times. Complete this exercise __________ times per day  EXTENSION RANGE OF MOTION AND FLEXIBILITY EXERCISES: STRETCH - Extension, Prone on Elbows   Lie on your stomach on the floor, a bed will be too soft. Place your palms about shoulder width apart and at the height of your head.  Place your elbows under your shoulders. If this is too painful, stack pillows under your chest.  Allow your body to relax so that your hips drop lower and make contact more completely with the floor.  Hold this position for __________ seconds.  Slowly return to lying flat on the floor. Repeat __________ times. Complete this exercise __________ times per day.  RANGE OF MOTION - Extension, Prone Press Ups  Lie on your stomach on the floor, a bed will be too soft. Place your palms about shoulder width apart and at the height of your head.  Keeping your back as relaxed as possible, slowly straighten your elbows while keeping  your hips on the floor. You may adjust the placement of your hands to maximize your comfort. As you gain motion, your hands will come more underneath your shoulders.  Hold this position __________ seconds.  Slowly return to lying flat on the floor. Repeat __________ times. Complete this exercise __________ times per day.  RANGE OF MOTION- Quadruped, Neutral Spine   Assume a hands and knees position on a firm surface. Keep your hands under your shoulders and your knees under your hips. You may place padding under your knees for comfort.  Drop your head and point your tail bone toward the ground below you. This will round out your lower back like an angry cat. Hold this position for __________ seconds.  Slowly lift your head and release your tail bone so that your back sags into a large arch, like an old horse.  Hold this  position for __________ seconds.  Repeat this until you feel limber in your lower back.  Now, find your "sweet spot." This will be the most comfortable position somewhere between the two previous positions. This is your neutral spine. Once you have found this position, tense your stomach muscles to support your lower back.  Hold this position for __________ seconds. Repeat __________ times. Complete this exercise __________ times per day.  STRENGTHENING EXERCISES - Low Back Strain These exercises may help you when beginning to rehabilitate your injury. These exercises should be done near your "sweet spot." This is the neutral, low-back arch, somewhere between fully rounded and fully arched, that is your least painful position. When performed in this safe range of motion, these exercises can be used for people who have either a flexion or extension based injury. These exercises may resolve your symptoms with or without further involvement from your physician, physical therapist or athletic trainer. While completing these exercises, remember:   Muscles can gain both the  endurance and the strength needed for everyday activities through controlled exercises.  Complete these exercises as instructed by your physician, physical therapist or athletic trainer. Increase the resistance and repetitions only as guided.  You may experience muscle soreness or fatigue, but the pain or discomfort you are trying to eliminate should never worsen during these exercises. If this pain does worsen, stop and make certain you are following the directions exactly. If the pain is still present after adjustments, discontinue the exercise until you can discuss the trouble with your caregiver. STRENGTHENING - Deep Abdominals, Pelvic Tilt  Lie on a firm bed or floor. Keeping your legs in front of you, bend your knees so they are both pointed toward the ceiling and your feet are flat on the floor.  Tense your lower abdominal muscles to press your lower back into the floor. This motion will rotate your pelvis so that your tail bone is scooping upwards rather than pointing at your feet or into the floor.  With a gentle tension and even breathing, hold this position for __________ seconds. Repeat __________ times. Complete this exercise __________ times per day.  STRENGTHENING - Abdominals, Crunches   Lie on a firm bed or floor. Keeping your legs in front of you, bend your knees so they are both pointed toward the ceiling and your feet are flat on the floor. Cross your arms over your chest.  Slightly tip your chin down without bending your neck.  Tense your abdominals and slowly lift your trunk high enough to just clear your shoulder blades. Lifting higher can put excessive stress on the lower back and does not further strengthen your abdominal muscles.  Control your return to the starting position. Repeat __________ times. Complete this exercise __________ times per day.  STRENGTHENING - Quadruped, Opposite UE/LE Lift   Assume a hands and knees position on a firm surface. Keep your hands  under your shoulders and your knees under your hips. You may place padding under your knees for comfort.  Find your neutral spine and gently tense your abdominal muscles so that you can maintain this position. Your shoulders and hips should form a rectangle that is parallel with the floor and is not twisted.  Keeping your trunk steady, lift your right hand no higher than your shoulder and then your left leg no higher than your hip. Make sure you are not holding your breath. Hold this position __________ seconds.  Continuing to keep your abdominal muscles tense and your  back steady, slowly return to your starting position. Repeat with the opposite arm and leg. Repeat __________ times. Complete this exercise __________ times per day.  STRENGTHENING - Lower Abdominals, Double Knee Lift  Lie on a firm bed or floor. Keeping your legs in front of you, bend your knees so they are both pointed toward the ceiling and your feet are flat on the floor.  Tense your abdominal muscles to brace your lower back and slowly lift both of your knees until they come over your hips. Be certain not to hold your breath.  Hold __________ seconds. Using your abdominal muscles, return to the starting position in a slow and controlled manner. Repeat __________ times. Complete this exercise __________ times per day.  POSTURE AND BODY MECHANICS CONSIDERATIONS - Low Back Strain Keeping correct posture when sitting, standing or completing your activities will reduce the stress put on different body tissues, allowing injured tissues a chance to heal and limiting painful experiences. The following are general guidelines for improved posture. Your physician or physical therapist will provide you with any instructions specific to your needs. While reading these guidelines, remember:  The exercises prescribed by your provider will help you have the flexibility and strength to maintain correct postures.  The correct posture provides  the best environment for your joints to work. All of your joints have less wear and tear when properly supported by a spine with good posture. This means you will experience a healthier, less painful body.  Correct posture must be practiced with all of your activities, especially prolonged sitting and standing. Correct posture is as important when doing repetitive low-stress activities (typing) as it is when doing a single heavy-load activity (lifting). RESTING POSITIONS Consider which positions are most painful for you when choosing a resting position. If you have pain with flexion-based activities (sitting, bending, stooping, squatting), choose a position that allows you to rest in a less flexed posture. You would want to avoid curling into a fetal position on your side. If your pain worsens with extension-based activities (prolonged standing, working overhead), avoid resting in an extended position such as sleeping on your stomach. Most people will find more comfort when they rest with their spine in a more neutral position, neither too rounded nor too arched. Lying on a non-sagging bed on your side with a pillow between your knees, or on your back with a pillow under your knees will often provide some relief. Keep in mind, being in any one position for a prolonged period of time, no matter how correct your posture, can still lead to stiffness. PROPER SITTING POSTURE In order to minimize stress and discomfort on your spine, you must sit with correct posture. Sitting with good posture should be effortless for a healthy body. Returning to good posture is a gradual process. Many people can work toward this most comfortably by using various supports until they have the flexibility and strength to maintain this posture on their own. When sitting with proper posture, your ears will fall over your shoulders and your shoulders will fall over your hips. You should use the back of the chair to support your upper  back. Your lower back will be in a neutral position, just slightly arched. You may place a small pillow or folded towel at the base of your lower back for support.  When working at a desk, create an environment that supports good, upright posture. Without extra support, muscles tire, which leads to excessive strain on joints and other  tissues. Keep these recommendations in mind: CHAIR:  A chair should be able to slide under your desk when your back makes contact with the back of the chair. This allows you to work closely.  The chair's height should allow your eyes to be level with the upper part of your monitor and your hands to be slightly lower than your elbows. BODY POSITION  Your feet should make contact with the floor. If this is not possible, use a foot rest.  Keep your ears over your shoulders. This will reduce stress on your neck and lower back. INCORRECT SITTING POSTURES  If you are feeling tired and unable to assume a healthy sitting posture, do not slouch or slump. This puts excessive strain on your back tissues, causing more damage and pain. Healthier options include:  Using more support, like a lumbar pillow.  Switching tasks to something that requires you to be upright or walking.  Talking a brief walk.  Lying down to rest in a neutral-spine position. PROLONGED STANDING WHILE SLIGHTLY LEANING FORWARD  When completing a task that requires you to lean forward while standing in one place for a long time, place either foot up on a stationary 2-4 inch high object to help maintain the best posture. When both feet are on the ground, the lower back tends to lose its slight inward curve. If this curve flattens (or becomes too large), then the back and your other joints will experience too much stress, tire more quickly, and can cause pain. CORRECT STANDING POSTURES Proper standing posture should be assumed with all daily activities, even if they only take a few moments, like when  brushing your teeth. As in sitting, your ears should fall over your shoulders and your shoulders should fall over your hips. You should keep a slight tension in your abdominal muscles to brace your spine. Your tailbone should point down to the ground, not behind your body, resulting in an over-extended swayback posture.  INCORRECT STANDING POSTURES  Common incorrect standing postures include a forward head, locked knees and/or an excessive swayback. WALKING Walk with an upright posture. Your ears, shoulders and hips should all line-up. PROLONGED ACTIVITY IN A FLEXED POSITION When completing a task that requires you to bend forward at your waist or lean over a low surface, try to find a way to stabilize 3 out of 4 of your limbs. You can place a hand or elbow on your thigh or rest a knee on the surface you are reaching across. This will provide you more stability so that your muscles do not fatigue as quickly. By keeping your knees relaxed, or slightly bent, you will also reduce stress across your lower back. CORRECT LIFTING TECHNIQUES DO :   Assume a wide stance. This will provide you more stability and the opportunity to get as close as possible to the object which you are lifting.  Tense your abdominals to brace your spine. Bend at the knees and hips. Keeping your back locked in a neutral-spine position, lift using your leg muscles. Lift with your legs, keeping your back straight.  Test the weight of unknown objects before attempting to lift them.  Try to keep your elbows locked down at your sides in order get the best strength from your shoulders when carrying an object.  Always ask for help when lifting heavy or awkward objects. INCORRECT LIFTING TECHNIQUES DO NOT:   Lock your knees when lifting, even if it is a small object.  Bend and twist.  Pivot at your feet or move your feet when needing to change directions.  Assume that you can safely pick up even a paper clip without proper  posture.   This information is not intended to replace advice given to you by your health care provider. Make sure you discuss any questions you have with your health care provider.   Document Released: 12/06/2005 Document Revised: 12/27/2014 Document Reviewed: 03/20/2009 [employer] Interactive Patient Education [employer]

## 2016-07-16 NOTE — Progress Notes (Signed)
Subjective:    Patient ID: Dwayne [name], male    DOB: 1932-04-12, 82 y.o.   MRN: [id number]  Chief Complaint  Patient presents with  . Back Pain    x2 weeks back pain that going down in the cheeks of bottom    HPI:  Dwayne [name] is a 82 y.o. male who  has a past medical history of Allergic rhinitis, cause unspecified; Arthritis; Atrial fibrillation (HCC); Bronchiectasis without acute exacerbation (HCC); Coronary artery disease; Disorders of diaphragm; Diverticulosis; Epistaxis; GERD (gastroesophageal reflux disease); H/O: GI bleed; Hyperlipidemia; Hypertension; LV dysfunction; Myocardial infarction (HCC); Plantar fascial fibromatosis; Spinal stenosis, unspecified region other than cervical; Tachy-brady syndrome (HCC); and Unspecified sinusitis (chronic). and presents today for an office visit.  This is a new problem. Associated symptom of pain located in his lumbar spine has been going on for about 1 weeks. Pain is described as dull and achy. Denies any trauma. Course of the symptoms have improved since the initial onset. Modifying factors include aspirin. He is on Eliquis for anticoagulation. Has also tried [employer] No radiculopathy. Has had back aches in the past.     Allergies  Allergen Reactions  . Asparaginase Derivatives Other (See Comments)    Makes jittery & causes vision problems, headaches  . Doxycycline Hyclate Diarrhea and Nausea And Vomiting  . Erythromycin Diarrhea and Nausea And Vomiting  . Tetracycline Diarrhea and Nausea And Vomiting  . Ambien [Zolpidem Tartrate] Other (See Comments)    unknown  . Caffeine Other (See Comments)    Interrupts sleep  . Clindamycin Other (See Comments)    REACTION: upset stomach  . Lactose Intolerance (Gi) Other (See Comments)    unknown  . Lyrica [Pregabalin] Other (See Comments)    unknown  . Metronidazole Itching and Rash  . Nitrostat [Nitroglycerin] Diarrhea  . Penicillins Hives    Has patient had a PCN reaction causing  immediate rash, facial/tongue/throat swelling, SOB or lightheadedness with hypotension: Yes Has patient had a PCN reaction causing severe rash involving mucus membranes or skin necrosis: No Has patient had a PCN reaction that required hospitalization No Has patient had a PCN reaction occurring within the last 10 years: No If all of the above answers are "NO", then may proceed with Cephalosporin use.   . Sulfonamide Derivatives Hives     Current Outpatient Prescriptions on File Prior to Visit  Medication Sig Dispense Refill  . atorvastatin (LIPITOR) 40 MG tablet Take 1 tablet (40 mg total) by mouth daily. 90 tablet 2  . Biotin 1000 MCG tablet Take 1,000 mcg by mouth daily.    . Calcium-Magnesium-Vitamin D (CITRACAL CALCIUM+D) 600-40-500 MG-MG-UNIT TB24 Take 2 tablets by mouth daily.     . cyanocobalamin (,VITAMIN B-12,) 1000 MCG/ML injection Inject 1,000 mcg into the muscle every 21 ( twenty-one) days.    . Doxepin HCl (SILENOR) 6 MG TABS Take 1 tablet (6 mg total) by mouth at bedtime as needed. (Patient taking differently: Take 1 tablet by mouth at bedtime. ) 90 tablet 3  . ELIQUIS 5 MG TABS tablet Take 1 tablet by mouth two  times daily 180 tablet 1  . ferrous sulfate 325 (65 FE) MG tablet Take 1 tablet (325 mg total) by mouth 2 (two) times daily with a meal. (Patient taking differently: Take 325 mg by mouth daily at 12 noon. ) 180 tablet 1  . fexofenadine (ALLEGRA) 180 MG tablet Take 180 mg by mouth at bedtime as needed (for allergies).     [name] Kitchen  finasteride (PROSCAR) 5 MG tablet Take 1 tablet by mouth at  bedtime 90 tablet 3  . Memantine HCl-Donepezil HCl (NAMZARIC) 7 & 14 & 21 &28 -10 MG C4PK Take 1 tablet by mouth daily. 28 each 3  . metoprolol tartrate (LOPRESSOR) 25 MG tablet Take 1 tablet by mouth  twice a day 180 tablet 1  . Multiple Vitamins-Minerals (BIO-35 GLUTEN-FREE PO) Take 1 tablet by mouth daily at 12 noon.    . Multiple Vitamins-Minerals (OCUVITE-LUTEIN PO) Take 1 tablet by  mouth daily.     . nitroGLYCERIN (NITROSTAT) 0.4 MG SL tablet Place 1 tablet (0.4 mg total) under the tongue every 5 (five) minutes x 3 doses as needed for chest pain. 25 tablet 1  . omeprazole (PRILOSEC) 40 MG capsule Take 40 mg by mouth daily.      . polyethylene glycol (MIRALAX / GLYCOLAX) packet Take 17 g by mouth daily as needed for moderate constipation.     [name] Kitchen PROLIA 60 MG/ML SOLN injection Inject 60 mg into the skin every 6 (six) months.     . terazosin (HYTRIN) 5 MG capsule Take 1 capsule by mouth  daily 90 capsule 3   No current facility-administered medications on file prior to visit.      Past Surgical History:  Procedure Laterality Date  . CATARACT EXTRACTION W/ INTRAOCULAR LENS  IMPLANT, BILATERAL    . CHOLECYSTECTOMY    . COLONOSCOPY    . CORONARY ARTERY BYPASS GRAFT     LIMA to LAD, SVG to OM, SVG to left circumflex, SVG to PD/PLSA  . DOPPLER ECHOCARDIOGRAPHY  2011  . HIP ARTHROPLASTY Right 12/10/2013   Procedure: Right Hip Cemented Hemiarthroplasty;  Surgeon: Eldred Manges, MD;  Location: [facility] OR;  Service: Orthopedics;  Laterality: Right;  Right Hip Cemented Hemiarthroplasty  . KIDNEY CYST REMOVAL    . PACEMAKER INSERTION     2007, Medtronic dual-chamber  . PERMANENT PACEMAKER GENERATOR CHANGE N/A 08/14/2014   Procedure: PERMANENT PACEMAKER GENERATOR CHANGE;  Surgeon: Marinus Maw, MD;  Location: [facility] CATH LAB;  Service: Cardiovascular;  Laterality: N/A;  . VERTEBROPLASTY      Past Medical History:  Diagnosis Date  . Allergic rhinitis, cause unspecified   . Arthritis   . Atrial fibrillation (HCC)    a. Was on Coumadin until GIB 01/2014.  . Bronchiectasis without acute exacerbation (HCC)   . Coronary artery disease    a. s/p CABG 1993. b. s/p PCI of SVG to OM '03.  . Disorders of diaphragm   . Diverticulosis   . Epistaxis   . GERD (gastroesophageal reflux disease)   . H/O: GI bleed    a. 01/2014 - presumed diverticular. Received PRBC, Vit K. Coumadin put on hold.    . Hyperlipidemia   . Hypertension   . LV dysfunction    a. Echo 08/2013: EF 45-50%.  . Myocardial infarction (HCC)   . Plantar fascial fibromatosis   . Spinal stenosis, unspecified region other than cervical   . Tachy-brady syndrome [facility])    s/p Medtronic PPM '07  . Unspecified sinusitis (chronic)      Review of Systems  Constitutional: Negative for chills and fever.  Musculoskeletal: Positive for back pain.  Neurological: Positive for weakness (Generalized). Negative for numbness.      Objective:    BP (!) 148/70 (BP Location: Left Arm, Patient Position: Sitting, Cuff Size: Normal)   Pulse 88   Temp 98 F (36.7 C) (Oral)   Resp 14  Ht 5\' 8"  (1.727 m)   Wt 170 lb (77.1 kg)   SpO2 94%   BMI 25.85 kg/m  Nursing note and vital signs reviewed.  Physical Exam  Constitutional: He is oriented to person, place, and time. He appears well-developed and well-nourished. No distress.  Seated in the chair with a Rollator in front of him. Moves around well and is slow to get out of the chair on occasion.  Cardiovascular: Normal rate, regular rhythm, normal heart sounds and intact distal pulses.   Pulmonary/Chest: Effort normal and breath sounds normal.  Musculoskeletal:  Low back - no obvious deformity, discoloration, or edema. Palpable tenderness around bilateral lumbar spine. Musculature. Mild spasm present. Range of motion was not tested secondary to patient decreased balance. Straight leg raise is negative. Unable to complete Faber's. Distal pulses and sensation are intact and appropriate.  Neurological: He is alert and oriented to person, place, and time.  Skin: Skin is warm and dry.  Psychiatric: He has a normal mood and affect. His behavior is normal. Judgment and thought content normal.       Assessment & Plan:   Problem List Items Addressed This Visit      Musculoskeletal and Integument   Lumbar strain    Mild lumbar strain with gradual improvement since initial onset.  Treat conservatively with ice/heat, home exercise therapy, over-the-counter medications as needed for symptom relief and supportive care, and start tizanidine as needed for muscle spasm. Side effects and proper medication usage discussed and repeated by patient. Started to avoid using too much aspirin secondary to anticoagulation. Follow-up if symptoms worsen or do not improve for further imaging or therapy.       Other Visit Diagnoses   None.      I am having Mr. Johansson start on tiZANidine. I am also having him maintain his fexofenadine, omeprazole, Calcium-Magnesium-Vitamin D, polyethylene glycol, PROLIA, Multiple Vitamins-Minerals (OCUVITE-LUTEIN PO), ferrous sulfate, cyanocobalamin, nitroGLYCERIN, terazosin, finasteride, atorvastatin, Doxepin HCl, Multiple Vitamins-Minerals (BIO-35 GLUTEN-FREE PO), Biotin, Memantine HCl-Donepezil HCl, ELIQUIS, and metoprolol tartrate.   Meds ordered this encounter  Medications  . tiZANidine (ZANAFLEX) 2 MG tablet    Sig: Take 0.5-1 tablets (1-2 mg total) by mouth every 6 (six) hours as needed for muscle spasms.    Dispense:  30 tablet    Refill:  0    Order Specific Question:   Supervising Provider    Answer:   Hillard Danker A [[id number]]     Follow-up: Return if symptoms worsen or fail to improve.  Jeanine Luz, FNP

## 2016-07-16 NOTE — Assessment & Plan Note (Signed)
Mild lumbar strain with gradual improvement since initial onset. Treat conservatively with ice/heat, home exercise therapy, over-the-counter medications as needed for symptom relief and supportive care, and start tizanidine as needed for muscle spasm. Side effects and proper medication usage discussed and repeated by patient. Started to avoid using too much aspirin secondary to anticoagulation. Follow-up if symptoms worsen or do not improve for further imaging or therapy.

## 2016-07-21 ENCOUNTER — Telehealth: Payer: Self-pay | Admitting: Family

## 2016-07-21 NOTE — Telephone Encounter (Signed)
Pt stating that muscle relaxer that Tammy Sours gave him is not working. Please advise.

## 2016-07-21 NOTE — Telephone Encounter (Signed)
LVM letting pt know.  

## 2016-07-21 NOTE — Telephone Encounter (Signed)
Please have him increase to 4 mg of tizanadine as needed.

## 2016-07-22 MED ORDER — ACETAMINOPHEN-CODEINE #3 300-30 MG PO TABS: 1.0000 | ORAL_TABLET | Freq: Four times a day (QID) | ORAL | 0 refills | Status: DC | PRN

## 2016-07-22 NOTE — Telephone Encounter (Signed)
Wife does not think increase is going to help.  Please refer to pain management.

## 2016-07-22 NOTE — Addendum Note (Signed)
Addended by: Jeanine Luz D on: 07/22/2016 05:07 PM   Modules accepted: Orders

## 2016-07-22 NOTE — Telephone Encounter (Signed)
Is requesting a different pain med to get him through until referral goes through.

## 2016-07-22 NOTE — Telephone Encounter (Signed)
Tylenol 3 sent to pharmacy for pain. Pain management may take up to 3 months to get in.

## 2016-07-23 NOTE — Telephone Encounter (Signed)
Spouse called in wanting to know if patient should stop muscle relaxors while taking tylenol 3.  Spoke with Delaney Meigs and she advised patient to stop the muscle relaxors and just start tylenol 3 as directed.  I informed her to give our office a call back Monday if the tylenol 3 does not seem to be working by itself.

## 2016-07-23 NOTE — Telephone Encounter (Signed)
Patient informed. 

## 2016-07-26 ENCOUNTER — Telehealth: Payer: Self-pay | Admitting: Emergency Medicine

## 2016-07-26 DIAGNOSIS — S39012A Strain of muscle, fascia and tendon of lower back, initial encounter: Secondary | ICD-10-CM

## 2016-07-26 NOTE — Telephone Encounter (Signed)
Please advise 

## 2016-07-26 NOTE — Telephone Encounter (Signed)
Noted and agree with the plan

## 2016-07-26 NOTE — Telephone Encounter (Signed)
Pts CNA called and stated pt is taking acetaminophen-codeine (TYLENOL #3) 300-30 MG tablet 1 tablet every 6 hrs. She said its not helping with the pain and wanted to know if he can take 2 tablets every 6 hrs instead. Please give them a call back thanks.

## 2016-07-27 NOTE — Telephone Encounter (Signed)
Yes that is fine. I will also send a referral to orthopedics

## 2016-07-27 NOTE — Telephone Encounter (Signed)
Dr. Cleophas DunkerWhitfield is his orthopedic doctor. Can you please send to his office.

## 2016-07-27 NOTE — Addendum Note (Signed)
Addended by: Jeanine LuzALONE, Jaela Yepez D on: 07/27/2016 12:41 AM   Modules accepted: Orders

## 2016-07-29 ENCOUNTER — Other Ambulatory Visit: Payer: Self-pay | Admitting: Orthopaedic Surgery

## 2016-07-29 DIAGNOSIS — M545 Low back pain: Secondary | ICD-10-CM | POA: Diagnosis not present

## 2016-07-29 DIAGNOSIS — M546 Pain in thoracic spine: Secondary | ICD-10-CM

## 2016-07-29 DIAGNOSIS — M25551 Pain in right hip: Secondary | ICD-10-CM | POA: Diagnosis not present

## 2016-07-30 ENCOUNTER — Ambulatory Visit
Admission: RE | Admit: 2016-07-30 | Discharge: 2016-07-30 | Disposition: A | Discharge status: Home / Self Care | Payer: Medicare Other | Source: Ambulatory Visit | Attending: Orthopaedic Surgery | Admitting: Orthopaedic Surgery

## 2016-07-30 DIAGNOSIS — S32018A Other fracture of first lumbar vertebra, initial encounter for closed fracture: Secondary | ICD-10-CM | POA: Diagnosis not present

## 2016-07-30 DIAGNOSIS — M546 Pain in thoracic spine: Secondary | ICD-10-CM

## 2016-08-03 ENCOUNTER — Other Ambulatory Visit: Payer: Self-pay | Admitting: Family

## 2016-08-03 DIAGNOSIS — M545 Low back pain: Secondary | ICD-10-CM | POA: Diagnosis not present

## 2016-08-04 NOTE — Telephone Encounter (Signed)
Last refill was 07/22/16. But they double up on the medication.

## 2016-08-05 ENCOUNTER — Encounter (HOSPITAL_COMMUNITY): Payer: Self-pay | Admitting: *Deleted

## 2016-08-05 ENCOUNTER — Emergency Department (HOSPITAL_COMMUNITY): Payer: Medicare Other

## 2016-08-05 ENCOUNTER — Emergency Department (HOSPITAL_COMMUNITY)
Admission: EM | Admit: 2016-08-05 | Discharge: 2016-08-05 | Disposition: A | Discharge status: Home / Self Care | Payer: Medicare Other | Attending: Emergency Medicine | Admitting: Emergency Medicine

## 2016-08-05 DIAGNOSIS — I2510 Atherosclerotic heart disease of native coronary artery without angina pectoris: Secondary | ICD-10-CM | POA: Diagnosis not present

## 2016-08-05 DIAGNOSIS — Z951 Presence of aortocoronary bypass graft: Secondary | ICD-10-CM | POA: Diagnosis not present

## 2016-08-05 DIAGNOSIS — Y9302 Activity, running: Secondary | ICD-10-CM | POA: Insufficient documentation

## 2016-08-05 DIAGNOSIS — Z950 Presence of cardiac pacemaker: Secondary | ICD-10-CM | POA: Insufficient documentation

## 2016-08-05 DIAGNOSIS — Y999 Unspecified external cause status: Secondary | ICD-10-CM | POA: Insufficient documentation

## 2016-08-05 DIAGNOSIS — Z7901 Long term (current) use of anticoagulants: Secondary | ICD-10-CM | POA: Diagnosis not present

## 2016-08-05 DIAGNOSIS — S91209A Unspecified open wound of unspecified toe(s) with damage to nail, initial encounter: Secondary | ICD-10-CM

## 2016-08-05 DIAGNOSIS — I252 Old myocardial infarction: Secondary | ICD-10-CM | POA: Insufficient documentation

## 2016-08-05 DIAGNOSIS — Z96641 Presence of right artificial hip joint: Secondary | ICD-10-CM | POA: Insufficient documentation

## 2016-08-05 DIAGNOSIS — Y929 Unspecified place or not applicable: Secondary | ICD-10-CM | POA: Diagnosis not present

## 2016-08-05 DIAGNOSIS — S91202A Unspecified open wound of left great toe with damage to nail, initial encounter: Secondary | ICD-10-CM | POA: Insufficient documentation

## 2016-08-05 DIAGNOSIS — W228XXA Striking against or struck by other objects, initial encounter: Secondary | ICD-10-CM | POA: Diagnosis not present

## 2016-08-05 DIAGNOSIS — Z87891 Personal history of nicotine dependence: Secondary | ICD-10-CM | POA: Diagnosis not present

## 2016-08-05 DIAGNOSIS — I10 Essential (primary) hypertension: Secondary | ICD-10-CM | POA: Diagnosis not present

## 2016-08-05 DIAGNOSIS — M79672 Pain in left foot: Secondary | ICD-10-CM | POA: Diagnosis not present

## 2016-08-05 DIAGNOSIS — S99922A Unspecified injury of left foot, initial encounter: Secondary | ICD-10-CM | POA: Diagnosis not present

## 2016-08-05 NOTE — ED Triage Notes (Signed)
Pt reports repeatedly running left big toe into objects causing injury. Today his family member stepped on toe and now has bleeding around big toenail. Bleeding controlled at triage.

## 2016-08-05 NOTE — Discharge Instructions (Signed)
Please read and follow all provided instructions.  Your diagnoses today include:  1. Nail avulsion of toe, initial encounter    Tests performed today include: Vital signs. See below for your results today.   Home care instructions:  Follow any educational materials contained in this packet.  Follow-up instructions: Please follow-up with your primary care provider for further evaluation of symptoms and treatment   Return instructions:  Please return to the Emergency Department if you do not get better, if you get worse, or new symptoms OR  - Fever (temperature greater than 101.12F)  - Bleeding that does not stop with holding pressure to the area    -Severe pain (please note that you may be more sore the day after your accident)  - Chest Pain  - Difficulty breathing  - Severe nausea or vomiting  - Inability to tolerate food and liquids  - Passing out  - Skin becoming red around your wounds  - Change in mental status (confusion or lethargy)  - New numbness or weakness    Please return if you have any other emergent concerns.  Additional Information:  Your vital signs today were: BP 128/89 (BP Location: Right Arm)    Pulse 81    Temp 98.4 F (36.9 C) (Oral)    Resp 18    Ht 5\' 11"  (1.803 m)    Wt 79.8 kg    SpO2 99%    BMI 24.55 kg/m  If your blood pressure (BP) was elevated above 135/85 this visit, please have this repeated by your doctor within one month. ---------------

## 2016-08-05 NOTE — ED Provider Notes (Signed)
[facility]-EMERGENCY DEPT Provider Note   CSN: [id number] Arrival date & time: 08/05/16  1032   By signing my name below, I, Freida Busmaniana Omoyeni, attest that this documentation has been prepared under the direction and in the presence of non-physician practitioner, Audry Piliyler Lilianne Delair, PA-C. Electronically Signed: Freida Busmaniana Omoyeni, Scribe. 08/05/2016. 2:37 PM.   History   Chief Complaint Chief Complaint  Patient presents with  . Toe Injury    The history is provided by the patient and the spouse. No language interpreter was used.    HPI Comments:  Dwayne [name] is a 82 y.o. male who presents to the Emergency Department complaining of a wound to the left great toe. His wife accidentally stepped on the toe ~ 1000 this AM. Pt states the toenail partially lifted off the toe; bleeding is controlled at this time. Pt states his pain has resolved at this time. His wife notes he is currently on Eliquis  Past Medical History:  Diagnosis Date  . Allergic rhinitis, cause unspecified   . Arthritis   . Atrial fibrillation (HCC)    a. Was on Coumadin until GIB 01/2014.  . Bronchiectasis without acute exacerbation (HCC)   . Coronary artery disease    a. s/p CABG 1993. b. s/p PCI of SVG to OM '03.  . Disorders of diaphragm   . Diverticulosis   . Epistaxis   . GERD (gastroesophageal reflux disease)   . H/O: GI bleed    a. 01/2014 - presumed diverticular. Received PRBC, Vit K. Coumadin put on hold.  . Hyperlipidemia   . Hypertension   . LV dysfunction    a. Echo 08/2013: EF 45-50%.  . Myocardial infarction (HCC)   . Plantar fascial fibromatosis   . Spinal stenosis, unspecified region other than cervical   . Tachy-brady syndrome [facility](HCC)    s/p Medtronic PPM '07  . Unspecified sinusitis (chronic)     Patient Active Problem List   Diagnosis Date Noted  . Lumbar strain 07/16/2016  . E. coli UTI (urinary tract infection) 05/24/2016  . Dementia arising in the senium and presenium 05/06/2016  . Routine general  medical examination at a health care facility 03/12/2015  . Insomnia 06/26/2014  . LV dysfunction   . Diverticulosis   . Tachy-brady syndrome (HCC)   . Coronary artery disease   . Anemia, iron deficiency 02/21/2014  . Encounter for therapeutic drug monitoring 01/17/2014  . Osteoporosis 01/04/2014  . Allergic rhinitis 01/04/2014  . Mild mitral regurgitation 02/13/2013  . Compression fracture 10/18/2012  . Constipation, chronic 05/25/2012  . Hyperglycemia 02/28/2012  . Essential hypertension, benign 02/22/2012  . BPH (benign prostatic hyperplasia) 02/22/2012  . ANEMIA, B12 DEFICIENCY 03/03/2011  . Long term current use of anticoagulant 02/08/2011  . Coronary atherosclerosis of artery bypass graft 08/19/2010  . Cardiac pacemaker in situ 09/02/2009  . Hyperlipidemia with target LDL less than 70 10/26/2007  . Atrial fibrillation (HCC) 10/26/2007  . BRONCHIECTASIS 10/26/2007  . SPINAL STENOSIS 10/26/2007    Past Surgical History:  Procedure Laterality Date  . CATARACT EXTRACTION W/ INTRAOCULAR LENS  IMPLANT, BILATERAL    . CHOLECYSTECTOMY    . COLONOSCOPY    . CORONARY ARTERY BYPASS GRAFT     LIMA to LAD, SVG to OM, SVG to left circumflex, SVG to PD/PLSA  . DOPPLER ECHOCARDIOGRAPHY  2011  . HIP ARTHROPLASTY Right 12/10/2013   Procedure: Right Hip Cemented Hemiarthroplasty;  Surgeon: Eldred MangesMark C Yates, MD;  [facility];  Service: Orthopedics;  Laterality: Right;  Right Hip Cemented Hemiarthroplasty  . KIDNEY CYST REMOVAL    . PACEMAKER INSERTION     2007, Medtronic dual-chamber  . PERMANENT PACEMAKER GENERATOR CHANGE N/A 08/14/2014   Procedure: PERMANENT PACEMAKER GENERATOR CHANGE;  Surgeon: Marinus MawGregg W Taylor, MD;  Location[facility];  Service: Cardiovascular;  Laterality: N/A;  . VERTEBROPLASTY       Home Medications    Prior to Admission medications   Medication Sig Start Date End Date Taking? Authorizing Provider  acetaminophen-codeine (TYLENOL #3) 300-30 MG tablet take 1  tablet by mouth every 6 hours if needed for pain 08/04/16   Veryl SpeakGregory D Calone, FNP  atorvastatin (LIPITOR) 40 MG tablet Take 1 tablet (40 mg total) by mouth daily. 01/01/16   Etta Grandchildhomas L Jones, MD  Biotin 1000 MCG tablet Take 1,000 mcg by mouth daily.    Historical Provider, MD  Calcium-Magnesium-Vitamin D (CITRACAL CALCIUM+D) 600-40-500 MG-MG-UNIT TB24 Take 2 tablets by mouth daily.     Historical Provider, MD  cyanocobalamin (,VITAMIN B-12,) 1000 MCG/ML injection Inject 1,000 mcg into the muscle every 21 ( twenty-one) days.    Historical Provider, MD  Doxepin HCl (SILENOR) 6 MG TABS Take 1 tablet (6 mg total) by mouth at bedtime as needed. Patient taking differently: Take 1 tablet by mouth at bedtime.  03/24/16   Etta Grandchildhomas L Jones, MD  ELIQUIS 5 MG TABS tablet Take 1 tablet by mouth two  times daily 06/02/16   Lewayne BuntingBrian S Crenshaw, MD  ferrous sulfate 325 (65 FE) MG tablet Take 1 tablet (325 mg total) by mouth 2 (two) times daily with a meal. Patient taking differently: Take 325 mg by mouth daily at 12 noon.  02/21/14   Etta Grandchildhomas L Jones, MD  fexofenadine (ALLEGRA) 180 MG tablet Take 180 mg by mouth at bedtime as needed (for allergies).     Historical Provider, MD  finasteride (PROSCAR) 5 MG tablet Take 1 tablet by mouth at  bedtime 12/31/15   Etta Grandchildhomas L Jones, MD  Memantine HCl-Donepezil HCl [facility](NAMZARIC) 7 & 14 & 21 &28 -10 MG C4PK Take 1 tablet by mouth daily. 06/01/16   Etta Grandchildhomas L Jones, MD  metoprolol tartrate (LOPRESSOR) 25 MG tablet Take 1 tablet by mouth  twice a day 06/02/16   Lewayne BuntingBrian S Crenshaw, MD  Multiple Vitamins-Minerals (BIO-35 GLUTEN-FREE PO) Take 1 tablet by mouth daily at 12 noon.    Historical Provider, MD  Multiple Vitamins-Minerals (OCUVITE-LUTEIN PO) Take 1 tablet by mouth daily.     Historical Provider, MD  nitroGLYCERIN (NITROSTAT) 0.4 MG SL tablet Place 1 tablet (0.4 mg total) under the tongue every 5 (five) minutes x 3 doses as needed for chest pain. 01/02/15   Lewayne BuntingBrian S Crenshaw, MD  omeprazole (PRILOSEC)  40 MG capsule Take 40 mg by mouth daily.      Historical Provider, MD  polyethylene glycol (MIRALAX / GLYCOLAX) packet Take 17 g by mouth daily as needed for moderate constipation.     Historical Provider, MD  PROLIA 60 MG/ML SOLN injection Inject 60 mg into the skin every 6 (six) months.  10/04/12   Historical Provider, MD  terazosin (HYTRIN) 5 MG capsule Take 1 capsule by mouth  daily 12/31/15   Etta Grandchildhomas L Jones, MD  tiZANidine (ZANAFLEX) 2 MG tablet Take 0.5-1 tablets (1-2 mg total) by mouth every 6 (six) hours as needed for muscle spasms. 07/16/16   Veryl SpeakGregory D Calone, FNP    Family History Family History  Problem Relation Age of Onset  . Stroke  Mother   . Diabetes Son   . Colon cancer Neg Hx   . Heart disease Neg Hx   . Throat cancer Neg Hx   . Pancreatic cancer Neg Hx   . Kidney disease Neg Hx   . Liver disease Neg Hx   . Osteoporosis Neg Hx     Social History Social History  Substance Use Topics  . Smoking status: Former Games developer  . Smokeless tobacco: Never Used     Comment: x 6 months in high school  . Alcohol use No     Comment: none     Allergies   Asparaginase derivatives; Doxycycline hyclate; Erythromycin; Tetracycline; Ambien [zolpidem tartrate]; Caffeine; Clindamycin; Lactose intolerance (gi); Lyrica [pregabalin]; Metronidazole; Nitrostat [nitroglycerin]; Penicillins; and Sulfonamide derivatives   Review of Systems Review of Systems  Musculoskeletal: Positive for arthralgias and myalgias.  Skin: Positive for wound.  Neurological: Negative for numbness.     Physical Exam Updated Vital Signs BP 128/89 (BP Location: Right Arm)   Pulse 81   Temp 98.4 F (36.9 C) (Oral)   Resp 18   Ht 5\' 11"  (1.803 m)   Wt 176 lb (79.8 kg)   SpO2 99%   BMI 24.55 kg/m   Physical Exam  Constitutional: He is oriented to person, place, and time. He appears well-developed and well-nourished. No distress.  HENT:  Head: Normocephalic and atraumatic.  Eyes: Conjunctivae are  normal.  Cardiovascular: Normal rate.   Pulmonary/Chest: Effort normal.  Abdominal: He exhibits no distension.  Neurological: He is alert and oriented to person, place, and time.  Skin: Skin is warm and dry.  No laceration on nail bed but completely avulsed left great toenail;  NVI nml sensation distally    Psychiatric: He has a normal mood and affect.  Nursing note and vitals reviewed.  ED Treatments / Results  DIAGNOSTIC STUDIES:  Oxygen Saturation is 99% on RA, normal by my interpretation.    COORDINATION OF CARE:  2:17 PM Discussed treatment plan with pt at bedside and pt agreed to plan.  Labs (all labs ordered are listed, but only abnormal results are displayed) Labs Reviewed - No data to display  EKG  EKG Interpretation None       Radiology Dg Toe Great Left  Result Date: 08/05/2016 CLINICAL DATA:  Pain and bleeding after foot stepped upon EXAM: LEFT FIRST TOE: THREE VIEWS COMPARISON:  None. FINDINGS: Frontal, oblique, and lateral views were obtained. There is nail bed disruption in the first digit. There is soft tissue opacity, likely hemorrhage, in the periungual region. There is no demonstrable fracture or dislocation. The joint spaces appear normal. No erosive change. IMPRESSION: Nail bed disruption with periungual hemorrhage in the first digit. No acute fracture or dislocation. No appreciable joint space narrowing. Electronically Signed   By: Bretta Bang III M.D.   On: 08/05/2016 11:50    Procedures .Nail Removal Date/Time: 08/05/2016 2:20 PM Performed by: Audry Pili Authorized by: Audry Pili   Consent:    Consent obtained:  Verbal   Consent given by:  Patient Location:    Foot:  L big toe Pre-procedure details:    Skin preparation:  Hibiclens Nail Removal:    Nail removed:  Complete Ingrown nail:    Nail matrix removed or ablated:  None Nails trimmed:    Number of nails trimmed:  1 Post-procedure details:    Dressing:  Antibiotic ointment  and 4x4 sterile gauze   Patient tolerance of procedure:  Tolerated well, no immediate  complications     Medications Ordered in ED Medications - No data to display   Initial Impression / Assessment and Plan / ED Course  I have reviewed the triage vital signs and the nursing notes.  Pertinent labs & imaging results that were available during my care of the patient were reviewed by me and considered in my medical decision making (see chart for details).  Clinical Course   No underlying fractures. Wound cleaned thoroughly with sterile saline and Hibiclens. Nail removed; bacitracin applied and toe wrapped in sterile gauze.  Final Clinical Impressions(s) / ED Diagnoses  I have reviewed the relevant previous healthcare records. I obtained HPI from historian. Patient discussed with supervising physician  ED Course:  Assessment:  No underlying fractures. Wound cleaned thoroughly with sterile saline and Hibiclens. Nail removed; bacitracin applied and toe wrapped in sterile gauze. Seen by supervising physician. Plan is to DC home with follow up to PCP. At time of discharge, Patient is in no acute distress. Vital Signs are stable. Patient is able to ambulate. Patient able to tolerate PO.   Disposition/Plan:  DC Home Additional Verbal discharge instructions given and discussed with patient.  Pt Instructed to f/u with PCP in the next week for evaluation and treatment of symptoms. Return precautions given Pt acknowledges and agrees with plan  Supervising Physician Lyndal Pulley, MD   Final diagnoses:  Nail avulsion of toe, initial encounter    New Prescriptions New Prescriptions   No medications on file   I personally performed the services described in this documentation, which was scribed in my presence. The recorded information has been reviewed and is accurate.     Audry Pili, PA-C 08/05/16 1439    Lyndal Pulley, MD 08/06/16 [phone]

## 2016-08-05 NOTE — ED Notes (Signed)
Per wife pt can't wait any longer. Seen leaving ED with wife and caregiver.

## 2016-08-05 NOTE — ED Notes (Signed)
Patient found outside waiting area and brought back to [facility] 3 per Audry Piliyler Mohr PA

## 2016-08-08 ENCOUNTER — Encounter (HOSPITAL_COMMUNITY): Payer: Self-pay | Admitting: Emergency Medicine

## 2016-08-08 ENCOUNTER — Emergency Department (HOSPITAL_COMMUNITY): Payer: Medicare Other

## 2016-08-08 ENCOUNTER — Emergency Department (HOSPITAL_COMMUNITY)
Admission: EM | Admit: 2016-08-08 | Discharge: 2016-08-08 | Disposition: A | Discharge status: Home / Self Care | Payer: Medicare Other | Attending: Emergency Medicine | Admitting: Emergency Medicine

## 2016-08-08 DIAGNOSIS — Z87891 Personal history of nicotine dependence: Secondary | ICD-10-CM | POA: Insufficient documentation

## 2016-08-08 DIAGNOSIS — IMO0002 Reserved for concepts with insufficient information to code with codable children: Secondary | ICD-10-CM

## 2016-08-08 DIAGNOSIS — I10 Essential (primary) hypertension: Secondary | ICD-10-CM | POA: Diagnosis not present

## 2016-08-08 DIAGNOSIS — M545 Low back pain: Secondary | ICD-10-CM | POA: Diagnosis not present

## 2016-08-08 DIAGNOSIS — M4856XA Collapsed vertebra, not elsewhere classified, lumbar region, initial encounter for fracture: Secondary | ICD-10-CM | POA: Diagnosis not present

## 2016-08-08 DIAGNOSIS — I2510 Atherosclerotic heart disease of native coronary artery without angina pectoris: Secondary | ICD-10-CM | POA: Insufficient documentation

## 2016-08-08 DIAGNOSIS — S32010A Wedge compression fracture of first lumbar vertebra, initial encounter for closed fracture: Secondary | ICD-10-CM | POA: Diagnosis not present

## 2016-08-08 DIAGNOSIS — R109 Unspecified abdominal pain: Secondary | ICD-10-CM | POA: Insufficient documentation

## 2016-08-08 DIAGNOSIS — M5489 Other dorsalgia: Secondary | ICD-10-CM | POA: Diagnosis not present

## 2016-08-08 NOTE — ED Notes (Signed)
MD at bedside. 

## 2016-08-08 NOTE — ED Notes (Signed)
Bed: WA07 Expected date:  Expected time:  Means of arrival:  Comments: 82 yo back pain

## 2016-08-08 NOTE — ED Notes (Signed)
Patient waiting on son to bring back brace.

## 2016-08-08 NOTE — ED Provider Notes (Signed)
[facility]-EMERGENCY DEPT Provider Note   CSN: [id number] Arrival date & time: 08/08/16  1746     History   Chief Complaint Chief Complaint  Patient presents with  . Back Pain    HPI Dwayne [name] is a 82 y.o. male.  The history is provided by the patient.  Back Pain   This is a new problem. Episode onset: 1 week. The problem occurs constantly. Progression since onset: flutuating. Associated with: Noted to have acute L1 compression fracture last week. Patient evaluated by orthopedic surgery who provided the patient with a brace. The pain is present in the lumbar spine. The quality of the pain is described as aching. The pain is moderate. The symptoms are aggravated by certain positions. Pertinent negatives include no chest pain, no fever, no numbness, no headaches, no abdominal pain, no abdominal swelling, no bowel incontinence, no perianal numbness, no bladder incontinence, no paresthesias and no weakness.     Remote L4 fracture and recent L1 compression fracture. No urinary symptoms. No diarrhea constipation. No difficulty ambulating. No decreased sensation in the saddle regions. Recent CT revealed aortic diameter of 2.9 cm.  Past Medical History:  Diagnosis Date  . Allergic rhinitis, cause unspecified   . Arthritis   . Atrial fibrillation (HCC)    a. Was on Coumadin until GIB 01/2014.  . Bronchiectasis without acute exacerbation (HCC)   . Coronary artery disease    a. s/p CABG 1993. b. s/p PCI of SVG to OM '03.  . Disorders of diaphragm   . Diverticulosis   . Epistaxis   . GERD (gastroesophageal reflux disease)   . H/O: GI bleed    a. 01/2014 - presumed diverticular. Received PRBC, Vit K. Coumadin put on hold.  . Hyperlipidemia   . Hypertension   . LV dysfunction    a. Echo 08/2013: EF 45-50%.  . Myocardial infarction (HCC)   . Plantar fascial fibromatosis   . Spinal stenosis, unspecified region other than cervical   . Tachy-brady syndrome [facility])    s/p Medtronic PPM '07  .  Unspecified sinusitis (chronic)     Patient Active Problem List   Diagnosis Date Noted  . Lumbar strain 07/16/2016  . E. coli UTI (urinary tract infection) 05/24/2016  . Dementia arising in the senium and presenium 05/06/2016  . Routine general medical examination at a health care facility 03/12/2015  . Insomnia 06/26/2014  . LV dysfunction   . Diverticulosis   . Tachy-brady syndrome (HCC)   . Coronary artery disease   . Anemia, iron deficiency 02/21/2014  . Encounter for therapeutic drug monitoring 01/17/2014  . Osteoporosis 01/04/2014  . Allergic rhinitis 01/04/2014  . Mild mitral regurgitation 02/13/2013  . Compression fracture 10/18/2012  . Constipation, chronic 05/25/2012  . Hyperglycemia 02/28/2012  . Essential hypertension, benign 02/22/2012  . BPH (benign prostatic hyperplasia) 02/22/2012  . ANEMIA, B12 DEFICIENCY 03/03/2011  . Long term current use of anticoagulant 02/08/2011  . Coronary atherosclerosis of artery bypass graft 08/19/2010  . Cardiac pacemaker in situ 09/02/2009  . Hyperlipidemia with target LDL less than 70 10/26/2007  . Atrial fibrillation (HCC) 10/26/2007  . BRONCHIECTASIS 10/26/2007  . SPINAL STENOSIS 10/26/2007    Past Surgical History:  Procedure Laterality Date  . CATARACT EXTRACTION W/ INTRAOCULAR LENS  IMPLANT, BILATERAL    . CHOLECYSTECTOMY    . COLONOSCOPY    . CORONARY ARTERY BYPASS GRAFT     LIMA to LAD, SVG to OM, SVG to left circumflex, SVG to PD/PLSA  .  DOPPLER ECHOCARDIOGRAPHY  2011  . HIP ARTHROPLASTY Right 12/10/2013   Procedure: Right Hip Cemented Hemiarthroplasty;  Surgeon: Eldred Manges, MD;  Location: [facility] OR;  Service: Orthopedics;  Laterality: Right;  Right Hip Cemented Hemiarthroplasty  . KIDNEY CYST REMOVAL    . PACEMAKER INSERTION     2007, Medtronic dual-chamber  . PERMANENT PACEMAKER GENERATOR CHANGE N/A 08/14/2014   Procedure: PERMANENT PACEMAKER GENERATOR CHANGE;  Surgeon: Marinus Maw, MD;  Location: [facility] CATH LAB;   Service: Cardiovascular;  Laterality: N/A;  . VERTEBROPLASTY         Home Medications    Prior to Admission medications   Medication Sig Start Date End Date Taking? Authorizing Provider  acetaminophen-codeine (TYLENOL #3) 300-30 MG tablet take 1 tablet by mouth every 6 hours if needed for pain 08/04/16   Veryl Speak, FNP  atorvastatin (LIPITOR) 40 MG tablet Take 1 tablet (40 mg total) by mouth daily. 01/01/16   [name], MD  Biotin 1000 MCG tablet Take 1,000 mcg by mouth daily.    Historical Provider, MD  Calcium-Magnesium-Vitamin D (CITRACAL CALCIUM+D) 600-40-500 MG-MG-UNIT TB24 Take 2 tablets by mouth daily.     Historical Provider, MD  cyanocobalamin (,VITAMIN B-12,) 1000 MCG/ML injection Inject 1,000 mcg into the muscle every 21 ( twenty-one) days.    Historical Provider, MD  Doxepin HCl (SILENOR) 6 MG TABS Take 1 tablet (6 mg total) by mouth at bedtime as needed. Patient taking differently: Take 1 tablet by mouth at bedtime.  03/24/16   [name], MD  ELIQUIS 5 MG TABS tablet Take 1 tablet by mouth two  times daily 06/02/16   [name], MD  ferrous sulfate 325 (65 FE) MG tablet Take 1 tablet (325 mg total) by mouth 2 (two) times daily with a meal. Patient taking differently: Take 325 mg by mouth daily at 12 noon.  02/21/14   [name], MD  fexofenadine (ALLEGRA) 180 MG tablet Take 180 mg by mouth at bedtime as needed (for allergies).     Historical Provider, MD  finasteride (PROSCAR) 5 MG tablet Take 1 tablet by mouth at  bedtime 12/31/15   [name], MD  Memantine HCl-Donepezil HCl [facility]) 7 & 14 & 21 &28 -10 MG C4PK Take 1 tablet by mouth daily. 06/01/16   [name], MD  metoprolol tartrate (LOPRESSOR) 25 MG tablet Take 1 tablet by mouth  twice a day 06/02/16   [name], MD  Multiple Vitamins-Minerals (BIO-35 GLUTEN-FREE PO) Take 1 tablet by mouth daily at 12 noon.    Historical Provider, MD  Multiple Vitamins-Minerals (OCUVITE-LUTEIN PO) Take 1  tablet by mouth daily.     Historical Provider, MD  nitroGLYCERIN (NITROSTAT) 0.4 MG SL tablet Place 1 tablet (0.4 mg total) under the tongue every 5 (five) minutes x 3 doses as needed for chest pain. 01/02/15   [name], MD  omeprazole (PRILOSEC) 40 MG capsule Take 40 mg by mouth daily.      Historical Provider, MD  polyethylene glycol (MIRALAX / GLYCOLAX) packet Take 17 g by mouth daily as needed for moderate constipation.     Historical Provider, MD  PROLIA 60 MG/ML SOLN injection Inject 60 mg into the skin every 6 (six) months.  10/04/12   Historical Provider, MD  terazosin (HYTRIN) 5 MG capsule Take 1 capsule by mouth  daily 12/31/15   [name], MD  tiZANidine (ZANAFLEX) 2 MG tablet Take 0.5-1 tablets (1-2  mg total) by mouth every 6 (six) hours as needed for muscle spasms. 07/16/16   Veryl SpeakGregory D Calone, FNP    Family History Family History  Problem Relation Age of Onset  . Stroke Mother   . Diabetes Son   . Colon cancer Neg Hx   . Heart disease Neg Hx   . Throat cancer Neg Hx   . Pancreatic cancer Neg Hx   . Kidney disease Neg Hx   . Liver disease Neg Hx   . Osteoporosis Neg Hx     Social History Social History  Substance Use Topics  . Smoking status: Former Games developermoker  . Smokeless tobacco: Never Used     Comment: x 6 months in high school  . Alcohol use No     Comment: none     Allergies   Asparaginase derivatives; Doxycycline hyclate; Erythromycin; Tetracycline; Ambien [zolpidem tartrate]; Caffeine; Clindamycin; Lactose intolerance (gi); Lyrica [pregabalin]; Metronidazole; Nitrostat [nitroglycerin]; Penicillins; and Sulfonamide derivatives   Review of Systems Review of Systems  Constitutional: Negative for appetite change, chills, fatigue and fever.  HENT: Negative for congestion and sore throat.   Eyes: Negative for visual disturbance.  Respiratory: Negative for cough, chest tightness and shortness of breath.   Cardiovascular: Negative for chest pain and  palpitations.  Gastrointestinal: Negative for abdominal pain, blood in stool, bowel incontinence, diarrhea, nausea and vomiting.  Endocrine: Negative for cold intolerance and heat intolerance.  Genitourinary: Negative for bladder incontinence, decreased urine volume, difficulty urinating and frequency.  Musculoskeletal: Positive for back pain. Negative for gait problem and neck stiffness.  Skin: Negative for rash.  Neurological: Negative for dizziness, weakness, light-headedness, numbness, headaches and paresthesias.     Physical Exam Updated Vital Signs BP 176/94 (BP Location: Left Arm)   Pulse 78   Temp 98.2 F (36.8 C) (Oral)   Resp 18   SpO2 96%   Physical Exam  Constitutional: He is oriented to person, place, and time. He appears well-nourished. No distress.  HENT:  Head: Normocephalic and atraumatic.  Right Ear: External ear normal.  Left Ear: External ear normal.  Eyes: Pupils are equal, round, and reactive to light. Right eye exhibits no discharge. Left eye exhibits no discharge. No scleral icterus.  Neck: Normal range of motion. Neck supple.  Cardiovascular: Normal rate.  Exam reveals no gallop and no friction rub.   No murmur heard. Pulmonary/Chest: Effort normal and breath sounds normal. No stridor. No respiratory distress. He has no wheezes. He has no rales. He exhibits no tenderness.  Abdominal: Soft. He exhibits no distension and no mass. There is no tenderness. There is no rebound and no guarding.  Musculoskeletal: He exhibits no edema.       Lumbar back: He exhibits tenderness.       Back:  Spine Exam: Inspection/Palpation: lumbar spine Strength: 5/5 throughout LE bilaterally (hip flexion/extension, adduction/abduction; knee flexion/extension; foot dorsiflexion/plantarflexion, inversion/eversion; great toe inversion) Sensation: Intact to light touch in proximal and distal LE bilaterally Reflexes: 2+ quadriceps and achilles reflexes   Neurological: He is alert  and oriented to person, place, and time.  Skin: Skin is warm and dry. No rash noted. He is not diaphoretic. No erythema.     ED Treatments / Results  Labs (all labs ordered are listed, but only abnormal results are displayed) Labs Reviewed - No data to display  EKG  EKG Interpretation None       Radiology Ct Lumbar Spine Wo Contrast  Result Date: 08/08/2016 CLINICAL DATA:  Chronic  back pain. History of vertebroplasty and dementia. No reported recent injury. EXAM: CT LUMBAR SPINE WITHOUT CONTRAST TECHNIQUE: Multidetector CT imaging of the lumbar spine was performed without intravenous contrast administration. Multiplanar CT image reconstructions were also generated. COMPARISON:  Radiographs 08/03/2016.  CT 07/30/2016. FINDINGS: Segmentation: Normal. Alignment: Stable grade 1 anterolisthesis at L4-5 secondary to facet disease. Vertebrae: Again demonstrated is a subacute L1 burst fracture, not significantly changed in appearance from recent study. There is greater than 50% loss of vertebral body height centrally and mild osseous retropulsion. Remote L4 fracture also appears stable status post spinal augmentation. This fracture is also associated with significant loss of vertebral body height and osseous retropulsion. No new fractures are seen. There are possible sequela old insufficiency fractures in the sacrum, grossly stable. Paraspinal and other soft tissues: Stable paraspinal hemorrhage at L1. Extensive aortoiliac atherosclerosis. Sigmoid colon diverticular changes are noted. Disc levels: L1-2: Stable mild multifactorial spinal stenosis secondary to annular disc bulging, facet and ligamentous hypertrophy. L2-3: Severe multifactorial spinal stenosis secondary to annular disc bulging, facet and ligamentous hypertrophy. L3-4: Severe multifactorial spinal stenosis secondary to annular disc bulging, facet and ligamentous hypertrophy. Moderate foraminal narrowing, worse on the right. L4-5: Severe  multifactorial spinal stenosis secondary to annular disc bulging, facet and ligamentous hypertrophy. Severe foraminal narrowing bilaterally with probable bilateral L4 nerve root encroachment, similar to previous study. L5-S1: Stable partially calcified central and left foraminal disc protrusion with moderate facet and ligamentous hypertrophy. Persistent severe left foraminal narrowing. IMPRESSION: 1. Compared with the prior CT from 3 weeks ago, no significant changes are identified. 2. The subacute burst fracture at L1 has not significantly changed. There is associated osseous retropulsion and greater than 50% loss of vertebral body height. 3. Stable chronic L4 fracture status post spinal augmentation. No new fractures. 4. Severe multilevel spondylosis contributing to severe multifactorial spinal stenosis from L2-3 through L4-5. Similar significant foraminal narrowing, especially at L4-5. Electronically Signed   By: Carey Bullocks M.D.   On: 08/08/2016 20:51    Procedures Procedures (including critical care time)  Emergency Focused Ultrasound Exam Limited Retroperitoneal Ultrasound of the Abdominal Aorta.   Performed and interpreted by Dr. Eudelia Bunch Indication: abdominal pain Multiple views of the abdominal aorta are obtained from the diaphragmatic hiatus to the aortic bifurcation in transverse and sagittal planes with a multi-frequency probe.  Findings: largest dimensions 2.7x2.47, with chronic dissection flap noted Interpretation: no abdominal aortic aneurysm, chronic dissection visualized, no signs of impending rupture Images archived electronically.  CPT Code: 16109   Medications Ordered in ED Medications - No data to display   Initial Impression / Assessment and Plan / ED Course  I have reviewed the triage vital signs and the nursing notes.  Pertinent labs & imaging results that were available during my care of the patient were reviewed by me and considered in my medical decision making  (see chart for details).  Clinical Course    Likely exacerbation from patient's recent compression fracture. No urinary symptoms. No red flags concerning for cauda equina. Patient does not currently have his brace thus we'll obtain a CT to assess for any worsening fracture.   CT with stable fracture. Redemonstrated similar aortic diameter. Bedside ultrasound without evidence of AAA. Did note a possible dissection flap which was noted on prior CTs with atherosclerosis suggesting it is chronic. No acute intervention required at this time.  Patient is safe for discharge with strict return precautions. Patient to follow-up with orthopedic spine for further management.  Final Clinical  Impressions(s) / ED Diagnoses   Final diagnoses:  Compression fracture   Disposition: Discharge  Condition: Good  I have discussed the results, Dx and Tx plan with the patient who expressed understanding and agree(s) with the plan. Discharge instructions discussed at great length. The patient was given strict return precautions who verbalized understanding of the instructions. No further questions at time of discharge.    Discharge Medication List as of 08/08/2016  9:31 PM      Follow Up: Etta Grandchildhomas L Jones, MD [address] [place] [place] [address] [phone]  Call  As needed  Valeria BatmanPeter W Whitfield, MD [address] [place] [address] [phone]  Schedule an appointment as soon as possible for a visit  As needed      Nira ConnPedro Eduardo Cardama, MD 08/09/16 [phone]

## 2016-08-08 NOTE — ED Triage Notes (Signed)
Pt from home with hx of chronic back pain. Pt saw his doctor this week and was given medications and a brace. Pt states he does not like the brace. Pt has a hx of dementia

## 2016-08-09 ENCOUNTER — Telehealth: Payer: Self-pay | Admitting: Emergency Medicine

## 2016-08-09 ENCOUNTER — Other Ambulatory Visit: Payer: Self-pay

## 2016-08-09 NOTE — Telephone Encounter (Signed)
Pts wife called and wanted to know why he couldn't get a refill on acetaminophen-codeine (TYLENOL #3) 300-30 MG tablet. Please advise thanks.

## 2016-08-10 NOTE — Telephone Encounter (Signed)
Spoke to pt and he stated that he is out of medication. Offered an appt with PCP. Pt agreed. Appt for tomorrow at 11am.

## 2016-08-10 NOTE — Telephone Encounter (Signed)
He got a refill on 08/16 by Marcos EkeGreg Calone

## 2016-08-11 ENCOUNTER — Ambulatory Visit (INDEPENDENT_AMBULATORY_CARE_PROVIDER_SITE_OTHER): Payer: Medicare Other | Admitting: Internal Medicine

## 2016-08-11 ENCOUNTER — Telehealth: Payer: Self-pay | Admitting: Internal Medicine

## 2016-08-11 ENCOUNTER — Encounter: Payer: Self-pay | Admitting: Internal Medicine

## 2016-08-11 VITALS — BP 102/68 | HR 80 | Temp 97.8°F

## 2016-08-11 DIAGNOSIS — I2584 Coronary atherosclerosis due to calcified coronary lesion: Secondary | ICD-10-CM

## 2016-08-11 DIAGNOSIS — M545 Low back pain, unspecified: Secondary | ICD-10-CM

## 2016-08-11 DIAGNOSIS — S32012K Unstable burst fracture of first lumbar vertebra, subsequent encounter for fracture with nonunion: Secondary | ICD-10-CM

## 2016-08-11 DIAGNOSIS — I2510 Atherosclerotic heart disease of native coronary artery without angina pectoris: Secondary | ICD-10-CM | POA: Diagnosis not present

## 2016-08-11 MED ORDER — BUPRENORPHINE 15 MCG/HR TD PTWK: 1.0000 | MEDICATED_PATCH | TRANSDERMAL | 5 refills | Status: DC

## 2016-08-11 MED ORDER — OXYCODONE HCL 5 MG PO TABS: 5.0000 mg | ORAL_TABLET | ORAL | 0 refills | Status: DC | PRN

## 2016-08-11 NOTE — Patient Instructions (Signed)

## 2016-08-11 NOTE — Telephone Encounter (Signed)
Patient would like to know if we had any discount cards for his pain patches.

## 2016-08-11 NOTE — Progress Notes (Signed)
Pre visit review using our clinic review tool, if applicable. No additional management support is needed unless otherwise documented below in the visit note. 

## 2016-08-11 NOTE — Progress Notes (Signed)
Subjective:  Patient ID: Dwayne [name], male    DOB: [birth year]  Age: 82 y.o. MRN: [id number]  CC: Back Pain   HPI Dwayne [name] presents for concerns about low back pain. About a month ago he woke up with low back pain. He went on to have a CT of his lumbar spine which showed an L1 burst fracture. He has since seen an orthopedist (Dr. Ophelia CharterYates) and was told that he is not a candidate for kyphoplasty. He has been placed in a brace. He has been taking Tylenol 3 and has not gotten much relief from the pain. Sometimes he takes double doses of the Tylenol 3 and still does not give pain relief. He describes a severe, stabbing pain in his lower back that does not radiate. He is wheelchair-bound. He denies paresthesias in his lower extremities.  Outpatient Medications Prior to Visit  Medication Sig Dispense Refill  . Biotin 1000 MCG tablet Take 1,000 mcg by mouth daily.    . Calcium-Magnesium-Vitamin D (CITRACAL CALCIUM+D) 600-40-500 MG-MG-UNIT TB24 Take 2 tablets by mouth daily.     . cyanocobalamin (,VITAMIN B-12,) 1000 MCG/ML injection Inject 1,000 mcg into the muscle every 21 ( twenty-one) days.    . Doxepin HCl (SILENOR) 6 MG TABS Take 1 tablet (6 mg total) by mouth at bedtime as needed. (Patient taking differently: Take 1 tablet by mouth at bedtime. ) 90 tablet 3  . ELIQUIS 5 MG TABS tablet Take 1 tablet by mouth two  times daily 180 tablet 1  . ferrous sulfate 325 (65 FE) MG tablet Take 1 tablet (325 mg total) by mouth 2 (two) times daily with a meal. (Patient taking differently: Take 325 mg by mouth daily at 12 noon. ) 180 tablet 1  . fexofenadine (ALLEGRA) 180 MG tablet Take 180 mg by mouth at bedtime as needed (for allergies).     . finasteride (PROSCAR) 5 MG tablet Take 1 tablet by mouth at  bedtime 90 tablet 3  . metoprolol tartrate (LOPRESSOR) 25 MG tablet Take 1 tablet by mouth  twice a day 180 tablet 1  . nitroGLYCERIN (NITROSTAT) 0.4 MG SL tablet Place 1 tablet (0.4 mg total) under the  tongue every 5 (five) minutes x 3 doses as needed for chest pain. 25 tablet 1  . omeprazole (PRILOSEC) 40 MG capsule Take 40 mg by mouth daily.      . polyethylene glycol (MIRALAX / GLYCOLAX) packet Take 17 g by mouth daily as needed for moderate constipation.     [name] Kitchen. PROLIA 60 MG/ML SOLN injection Inject 60 mg into the skin every 6 (six) months.     . terazosin (HYTRIN) 5 MG capsule Take 1 capsule by mouth  daily 90 capsule 3  . tiZANidine (ZANAFLEX) 2 MG tablet Take 0.5-1 tablets (1-2 mg total) by mouth every 6 (six) hours as needed for muscle spasms. 30 tablet 0  . acetaminophen-codeine (TYLENOL #3) 300-30 MG tablet take 1 tablet by mouth every 6 hours if needed for pain 60 tablet 0  . atorvastatin (LIPITOR) 40 MG tablet Take 1 tablet (40 mg total) by mouth daily. 90 tablet 2  . Memantine HCl-Donepezil HCl (NAMZARIC) 7 & 14 & 21 &28 -10 MG C4PK Take 1 tablet by mouth daily. 28 each 3  . Multiple Vitamins-Minerals (BIO-35 GLUTEN-FREE PO) Take 1 tablet by mouth daily at 12 noon.    . Multiple Vitamins-Minerals (OCUVITE-LUTEIN PO) Take 1 tablet by mouth daily.      No  facility-administered medications prior to visit.     ROS Review of Systems  Constitutional: Negative.  Negative for activity change, appetite change, chills, diaphoresis, fatigue and fever.  HENT: Negative.   Eyes: Negative.   Respiratory: Negative for cough, choking, chest tightness, shortness of breath and stridor.   Cardiovascular: Negative.  Negative for chest pain, palpitations and leg swelling.  Gastrointestinal: Negative for abdominal pain, constipation, diarrhea, nausea and vomiting.  Endocrine: Negative.   Genitourinary: Negative.  Negative for difficulty urinating.  Musculoskeletal: Positive for back pain. Negative for joint swelling and myalgias.  Skin: Negative.  Negative for color change, pallor and rash.  Allergic/Immunologic: Negative.   Neurological: Negative.  Negative for dizziness, tremors, weakness,  numbness and headaches.  Hematological: Negative.  Negative for adenopathy. Does not bruise/bleed easily.  Psychiatric/Behavioral: Negative.     Objective:  BP 102/68 (BP Location: Right Arm, Patient Position: Sitting, Cuff Size: Normal)   Pulse 80   Temp 97.8 F (36.6 C) (Oral)   SpO2 96%   BP Readings from Last 3 Encounters:  08/11/16 102/68  08/08/16 157/99  08/05/16 128/89    Wt Readings from Last 3 Encounters:  08/08/16 176 lb (79.8 kg)  08/05/16 176 lb (79.8 kg)  07/16/16 170 lb (77.1 kg)    Physical Exam  Constitutional: He is oriented to person, place, and time.  Non-toxic appearance. He does not have a sickly appearance. He does not appear ill. No distress.  Elderly, frail male in a back brace and confined to a wheelchair  HENT:  Mouth/Throat: Oropharynx is clear and moist. No oropharyngeal exudate.  Eyes: Conjunctivae are normal. Right eye exhibits no discharge. Left eye exhibits no discharge. No scleral icterus.  Neck: Normal range of motion. Neck supple. No JVD present. No tracheal deviation present. No thyromegaly present.  Cardiovascular: Normal rate, regular rhythm, normal heart sounds and intact distal pulses.  Exam reveals no gallop and no friction rub.   No murmur heard. Pulmonary/Chest: Effort normal and breath sounds normal. No stridor. No respiratory distress. He has no wheezes. He has no rales. He exhibits no tenderness.  Abdominal: Soft. Bowel sounds are normal. He exhibits no distension and no mass. There is no tenderness. There is no rebound and no guarding.  Musculoskeletal: Normal range of motion. He exhibits no edema, tenderness or deformity.  Lymphadenopathy:    He has no cervical adenopathy.  Neurological: He is oriented to person, place, and time. He displays no atrophy and no tremor. No cranial nerve deficit or sensory deficit. He exhibits normal muscle tone. He displays a negative Romberg sign. He displays no seizure activity. Coordination and  gait normal.  Reflex Scores:      Brachioradialis reflexes are 1+ on the right side and 1+ on the left side.      Patellar reflexes are 1+ on the right side and 1+ on the left side.      Achilles reflexes are 0 on the right side and 0 on the left side. Neg SLR in BLE  Skin: Skin is warm and dry. No rash noted. He is not diaphoretic. No erythema. No pallor.  Vitals reviewed.   Lab Results  Component Value Date   WBC 8.5 05/14/2016   HGB 14.1 05/14/2016   HCT 42.8 05/14/2016   PLT 235 05/14/2016   GLUCOSE 108 (H) 05/14/2016   CHOL 138 02/02/2016   TRIG 70 02/02/2016   HDL 44 02/02/2016   LDLCALC 80 02/02/2016   ALT 17 05/14/2016  AST 26 05/14/2016   NA 140 05/14/2016   K 4.6 05/14/2016   CL 109 05/14/2016   CREATININE 0.75 05/14/2016   BUN 10 05/14/2016   CO2 25 05/14/2016   TSH 1.30 03/11/2015   PSA 3.29 02/07/2015   INR 1.4 (H) 02/21/2014   HGBA1C 5.9 03/11/2015    Ct Lumbar Spine Wo Contrast  Result Date: 08/08/2016 CLINICAL DATA:  Chronic back pain. History of vertebroplasty and dementia. No reported recent injury. EXAM: CT LUMBAR SPINE WITHOUT CONTRAST TECHNIQUE: Multidetector CT imaging of the lumbar spine was performed without intravenous contrast administration. Multiplanar CT image reconstructions were also generated. COMPARISON:  Radiographs 08/03/2016.  CT 07/30/2016. FINDINGS: Segmentation: Normal. Alignment: Stable grade 1 anterolisthesis at L4-5 secondary to facet disease. Vertebrae: Again demonstrated is a subacute L1 burst fracture, not significantly changed in appearance from recent study. There is greater than 50% loss of vertebral body height centrally and mild osseous retropulsion. Remote L4 fracture also appears stable status post spinal augmentation. This fracture is also associated with significant loss of vertebral body height and osseous retropulsion. No new fractures are seen. There are possible sequela old insufficiency fractures in the sacrum, grossly  stable. Paraspinal and other soft tissues: Stable paraspinal hemorrhage at L1. Extensive aortoiliac atherosclerosis. Sigmoid colon diverticular changes are noted. Disc levels: L1-2: Stable mild multifactorial spinal stenosis secondary to annular disc bulging, facet and ligamentous hypertrophy. L2-3: Severe multifactorial spinal stenosis secondary to annular disc bulging, facet and ligamentous hypertrophy. L3-4: Severe multifactorial spinal stenosis secondary to annular disc bulging, facet and ligamentous hypertrophy. Moderate foraminal narrowing, worse on the right. L4-5: Severe multifactorial spinal stenosis secondary to annular disc bulging, facet and ligamentous hypertrophy. Severe foraminal narrowing bilaterally with probable bilateral L4 nerve root encroachment, similar to previous study. L5-S1: Stable partially calcified central and left foraminal disc protrusion with moderate facet and ligamentous hypertrophy. Persistent severe left foraminal narrowing. IMPRESSION: 1. Compared with the prior CT from 3 weeks ago, no significant changes are identified. 2. The subacute burst fracture at L1 has not significantly changed. There is associated osseous retropulsion and greater than 50% loss of vertebral body height. 3. Stable chronic L4 fracture status post spinal augmentation. No new fractures. 4. Severe multilevel spondylosis contributing to severe multifactorial spinal stenosis from L2-3 through L4-5. Similar significant foraminal narrowing, especially at L4-5. Electronically Signed   By: Carey Bullocks M.D.   On: 08/08/2016 20:51    Assessment & Plan:   Dwayne [name] was seen today for back pain.  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 -     Discontinue: Buprenorphine (BUTRANS) 15 MCG/HR PTWK; Place 1 Act onto the skin once a week. -     oxyCODONE (OXY IR/ROXICODONE) 5 MG immediate release tablet; Take 1 tablet (5 mg total) by mouth every 4 (four) hours as needed for severe pain. -     Discontinue: fentaNYL (DURAGESIC - DOSED MCG/HR) 25 MCG/HR patch; Place 1 patch (25 mcg total) onto the skin every 3 (three) days. -     Discontinue: fentaNYL (DURAGESIC - DOSED MCG/HR) 25 MCG/HR patch; Place 1 patch (25 mcg total) onto the skin every 3 (three) days. -  Discontinue: fentaNYL (DURAGESIC - DOSED MCG/HR) 25 MCG/HR patch; Place 1 patch (25 mcg total) onto the skin every 3 (three) days. -     fentaNYL (DURAGESIC - DOSED MCG/HR) 25 MCG/HR patch; Place 1 patch (25 mcg total) onto the skin every 3 (three) days.  Left-sided low back pain without sciatica- as above -     Discontinue: Buprenorphine (BUTRANS) 15 MCG/HR PTWK; Place 1 Act onto the skin once a week. -     oxyCODONE (OXY IR/ROXICODONE) 5 MG immediate release tablet; Take 1 tablet (5 mg total) by mouth every 4 (four) hours as needed for severe pain. -     Discontinue: fentaNYL (DURAGESIC - DOSED MCG/HR) 25 MCG/HR patch; Place 1 patch (25 mcg total) onto the skin every 3 (three) days. -     Discontinue: fentaNYL (DURAGESIC - DOSED MCG/HR) 25 MCG/HR patch; Place 1 patch (25 mcg total) onto the skin every 3 (three) days. -     Discontinue: fentaNYL (DURAGESIC - DOSED MCG/HR) 25 MCG/HR patch; Place 1 patch (25 mcg total) onto the skin every 3 (three) days. -     fentaNYL (DURAGESIC - DOSED MCG/HR) 25 MCG/HR patch; Place 1 patch (25 mcg total) onto the skin every 3 (three) days.   I have discontinued Dwayne [name]'s Multiple Vitamins-Minerals (OCUVITE-LUTEIN PO), atorvastatin, Multiple Vitamins-Minerals (BIO-35 GLUTEN-FREE PO), Memantine HCl-Donepezil HCl, acetaminophen-codeine, and Buprenorphine. I am also having him start on oxyCODONE.  Additionally, I am having him maintain his fexofenadine, omeprazole, Calcium-Magnesium-Vitamin D, polyethylene glycol, PROLIA, ferrous sulfate, cyanocobalamin, nitroGLYCERIN, terazosin, finasteride, Doxepin HCl, Biotin, ELIQUIS, metoprolol tartrate, tiZANidine, and fentaNYL.  Meds ordered this encounter  Medications  . DISCONTD: Buprenorphine (BUTRANS) 15 MCG/HR PTWK    Sig: Place 1 Act onto the skin once a week.    Dispense:  4 patch    Refill:  5  . oxyCODONE (OXY IR/ROXICODONE) 5 MG immediate release tablet    Sig: Take 1 tablet (5 mg total) by mouth every 4 (four) hours as needed for severe pain.    Dispense:  100 tablet    Refill:  0  . DISCONTD: fentaNYL (DURAGESIC - DOSED MCG/HR) 25 MCG/HR patch    Sig: Place 1 patch (25 mcg total) onto the skin every 3 (three) days.    Dispense:  10 patch    Refill:  0  . DISCONTD: fentaNYL (DURAGESIC - DOSED MCG/HR) 25 MCG/HR patch    Sig: Place 1 patch (25 mcg total) onto the skin every 3 (three) days.    Dispense:  10 patch    Refill:  0  . DISCONTD: fentaNYL (DURAGESIC - DOSED MCG/HR) 25 MCG/HR patch    Sig: Place 1 patch (25 mcg total) onto the skin every 3 (three) days.    Dispense:  10 patch    Refill:  0  . fentaNYL (DURAGESIC - DOSED MCG/HR) 25 MCG/HR patch    Sig: Place 1 patch (25 mcg total) onto the skin every 3 (three) days.    Dispense:  10 patch    Refill:  0     Follow-up: Return in about 3 months (around 11/11/2016).  Sanda Lingerhomas Brixton Franko, MD

## 2016-08-12 MED ORDER — FENTANYL 25 MCG/HR TD PT72: 25.0000 ug | MEDICATED_PATCH | TRANSDERMAL | 0 refills | Status: DC

## 2016-08-12 NOTE — Telephone Encounter (Signed)
Changed to a less expensive option  The new option is a schedule 2 narcotic so they will have to come pick up the prescription and every month to take a new prescription to the pharmacy to get it filled

## 2016-08-12 NOTE — Telephone Encounter (Signed)
Pt spouse states that the Butrans is $400 for a 30 day supply.

## 2016-08-12 NOTE — Telephone Encounter (Signed)
No, I don't have any co-pay cards but can change to a different patch if Dwayne GuiseButrans is too expensive

## 2016-08-13 ENCOUNTER — Ambulatory Visit (INDEPENDENT_AMBULATORY_CARE_PROVIDER_SITE_OTHER): Payer: Medicare Other | Admitting: *Deleted

## 2016-08-13 DIAGNOSIS — E538 Deficiency of other specified B group vitamins: Secondary | ICD-10-CM

## 2016-08-13 MED ORDER — CYANOCOBALAMIN 1000 MCG/ML IJ SOLN
1000.0000 ug | Freq: Once | INTRAMUSCULAR | Status: AC
  Administered 2016-08-13: 1000 ug via INTRAMUSCULAR

## 2016-08-19 ENCOUNTER — Encounter: Payer: Self-pay | Admitting: Internal Medicine

## 2016-08-19 ENCOUNTER — Other Ambulatory Visit: Payer: Self-pay

## 2016-08-19 ENCOUNTER — Ambulatory Visit (INDEPENDENT_AMBULATORY_CARE_PROVIDER_SITE_OTHER): Payer: Medicare Other | Admitting: Internal Medicine

## 2016-08-19 VITALS — BP 110/68 | HR 72 | Ht 71.0 in

## 2016-08-19 DIAGNOSIS — I495 Sick sinus syndrome: Secondary | ICD-10-CM

## 2016-08-19 DIAGNOSIS — I2510 Atherosclerotic heart disease of native coronary artery without angina pectoris: Secondary | ICD-10-CM | POA: Diagnosis not present

## 2016-08-19 DIAGNOSIS — I2584 Coronary atherosclerosis due to calcified coronary lesion: Secondary | ICD-10-CM | POA: Diagnosis not present

## 2016-08-19 DIAGNOSIS — Z950 Presence of cardiac pacemaker: Secondary | ICD-10-CM

## 2016-08-19 LAB — CUP PACEART INCLINIC DEVICE CHECK
Battery Impedance: 177 Ohm
Implantable Lead Implant Date: 20070905
Implantable Lead Location: 753860
Implantable Lead Model: 5076
Implantable Lead Model: 5076
Lead Channel Impedance Value: 513 Ohm
Lead Channel Pacing Threshold Amplitude: 0.75 V
Lead Channel Pacing Threshold Pulse Width: 0.4 ms
Lead Channel Setting Pacing Pulse Width: 0.4 ms
MDC IDC LEAD IMPLANT DT: 20070905
MDC IDC LEAD LOCATION: 753859
MDC IDC MSMT BATTERY REMAINING LONGEVITY: 125 mo
MDC IDC MSMT BATTERY VOLTAGE: 2.79 V
MDC IDC MSMT LEADCHNL RA IMPEDANCE VALUE: 67 Ohm
MDC IDC MSMT LEADCHNL RV SENSING INTR AMPL: 11.2 mV
MDC IDC SESS DTM: 20170831152405
MDC IDC SET LEADCHNL RV PACING AMPLITUDE: 2 V
MDC IDC SET LEADCHNL RV SENSING SENSITIVITY: 4 mV
MDC IDC STAT BRADY RV PERCENT PACED: 97 %

## 2016-08-19 NOTE — Patient Instructions (Signed)
Medication Instructions:  Your physician recommends that you continue on your current medications as directed. Please refer to the Current Medication list given to you today.   Labwork: None ordered   Testing/Procedures: None ordered   Follow-Up: Your physician wants you to follow-up in: 12 months with Dr Taylor You will receive a reminder letter in the mail two months in advance. If you don't receive a letter, please call our office to schedule the follow-up appointment.  Remote monitoring is used to monitor your Pacemaker  from home. This monitoring reduces the number of office visits required to check your device to one time per year. It allows us to keep an eye on the functioning of your device to ensure it is working properly. You are scheduled for a device check from home on 11/18/16. You may send your transmission at any time that day. If you have a wireless device, the transmission will be sent automatically. After your physician reviews your transmission, you will receive a postcard with your next transmission date.     Any Other Special Instructions Will Be Listed Below (If Applicable).     If you need a refill on your cardiac medications before your next appointment, please call your pharmacy.   

## 2016-08-19 NOTE — Progress Notes (Signed)
HPI [name] returns today for followup. He is a very pleasant 82 year old man with a history of chronic atrial fibrillation and complete heart block status post permanent pacemaker insertion. In the interim, he has broken his back and does not know how he did it. He denies any falls. He denies syncope, chest pain, shortness of breath, or peripheral edema. He admits to some daytime somnolence if he takes his sleeping medication. His back pain is reasonably well controlled. Allergies  Allergen Reactions  . Asparaginase Derivatives Other (See Comments)    Makes jittery & causes vision problems, headaches  . Doxycycline Hyclate Diarrhea and Nausea And Vomiting  . Erythromycin Diarrhea and Nausea And Vomiting  . Tetracycline Diarrhea and Nausea And Vomiting  . Ambien [Zolpidem Tartrate] Other (See Comments)    unknown  . Caffeine Other (See Comments)    Interrupts sleep  . Clindamycin Other (See Comments)    REACTION: upset stomach  . Lactose Intolerance (Gi) Other (See Comments)    unknown  . Lyrica [Pregabalin] Other (See Comments)    unknown  . Metronidazole Itching and Rash  . Nitrostat [Nitroglycerin] Diarrhea  . Penicillins Hives    Has patient had a PCN reaction causing immediate rash, facial/tongue/throat swelling, SOB or lightheadedness with hypotension: Yes Has patient had a PCN reaction causing severe rash involving mucus membranes or skin necrosis: No Has patient had a PCN reaction that required hospitalization No Has patient had a PCN reaction occurring within the last 10 years: No If all of the above answers are "NO", then may proceed with Cephalosporin use.   . Sulfonamide Derivatives Hives     Current Outpatient Prescriptions  Medication Sig Dispense Refill  . Biotin 1000 MCG tablet Take 1,000 mcg by mouth daily.    . Calcium-Magnesium-Vitamin D (CITRACAL CALCIUM+D) 600-40-500 MG-MG-UNIT TB24 Take 2 tablets by mouth daily.     . cyanocobalamin (,VITAMIN B-12,) 1000  MCG/ML injection Inject 1,000 mcg into the muscle every 21 ( twenty-one) days.    . Doxepin HCl (SILENOR) 6 MG TABS Take 1 tablet by mouth at bedtime.    [name] Kitchen. ELIQUIS 5 MG TABS tablet Take 1 tablet by mouth two  times daily 180 tablet 1  . fentaNYL (DURAGESIC - DOSED MCG/HR) 25 MCG/HR patch Place 1 patch (25 mcg total) onto the skin every 3 (three) days. 10 patch 0  . ferrous sulfate 325 (65 FE) MG tablet Take 650 mg by mouth daily.    . fexofenadine (ALLEGRA) 180 MG tablet Take 180 mg by mouth at bedtime as needed (for allergies).     . finasteride (PROSCAR) 5 MG tablet Take 1 tablet by mouth at  bedtime 90 tablet 3  . metoprolol tartrate (LOPRESSOR) 25 MG tablet Take 1 tablet by mouth  twice a day 180 tablet 1  . NAMZARIC 7 & 14 & 21 &28 -10 MG C4PK Take 1 tablet by mouth daily.  3  . nitroGLYCERIN (NITROSTAT) 0.4 MG SL tablet Place 1 tablet (0.4 mg total) under the tongue every 5 (five) minutes x 3 doses as needed for chest pain. 25 tablet 1  . omeprazole (PRILOSEC) 40 MG capsule Take 40 mg by mouth daily.      [name] Kitchen. oxyCODONE (OXY IR/ROXICODONE) 5 MG immediate release tablet Take 1 tablet (5 mg total) by mouth every 4 (four) hours as needed for severe pain. 100 tablet 0  . polyethylene glycol (MIRALAX / GLYCOLAX) packet Take 17 g by mouth daily as needed for moderate constipation.     .[name] Kitchen  PROLIA 60 MG/ML SOLN injection Inject 60 mg into the skin every 6 (six) months.     . terazosin (HYTRIN) 5 MG capsule Take 1 capsule by mouth  daily 90 capsule 3   No current facility-administered medications for this visit.      Past Medical History:  Diagnosis Date  . Allergic rhinitis, cause unspecified   . Arthritis   . Atrial fibrillation (HCC)    a. Was on Coumadin until GIB 01/2014.  . Bronchiectasis without acute exacerbation (HCC)   . Coronary artery disease    a. s/p CABG 1993. b. s/p PCI of SVG to OM '03.  . Disorders of diaphragm   . Diverticulosis   . Epistaxis   . GERD (gastroesophageal  reflux disease)   . H/O: GI bleed    a. 01/2014 - presumed diverticular. Received PRBC, Vit K. Coumadin put on hold.  . Hyperlipidemia   . Hypertension   . LV dysfunction    a. Echo 08/2013: EF 45-50%.  . Myocardial infarction (HCC)   . Plantar fascial fibromatosis   . Spinal stenosis, unspecified region other than cervical   . Tachy-brady syndrome [facility])    s/p Medtronic PPM '07  . Unspecified sinusitis (chronic)     ROS:   All systems reviewed and negative except as noted in the HPI.   Past Surgical History:  Procedure Laterality Date  . CATARACT EXTRACTION W/ INTRAOCULAR LENS  IMPLANT, BILATERAL    . CHOLECYSTECTOMY    . COLONOSCOPY    . CORONARY ARTERY BYPASS GRAFT     LIMA to LAD, SVG to OM, SVG to left circumflex, SVG to PD/PLSA  . DOPPLER ECHOCARDIOGRAPHY  2011  . HIP ARTHROPLASTY Right 12/10/2013   Procedure: Right Hip Cemented Hemiarthroplasty;  Surgeon: Eldred Manges, MD;  Location: [facility] OR;  Service: Orthopedics;  Laterality: Right;  Right Hip Cemented Hemiarthroplasty  . KIDNEY CYST REMOVAL    . PACEMAKER INSERTION     2007, Medtronic dual-chamber  . PERMANENT PACEMAKER GENERATOR CHANGE N/A 08/14/2014   Procedure: PERMANENT PACEMAKER GENERATOR CHANGE;  Surgeon: Marinus Maw, MD;  Location: [facility] CATH LAB;  Service: Cardiovascular;  Laterality: N/A;  . VERTEBROPLASTY       Family History  Problem Relation Age of Onset  . Stroke Mother   . Diabetes Son   . Colon cancer Neg Hx   . Heart disease Neg Hx   . Throat cancer Neg Hx   . Pancreatic cancer Neg Hx   . Kidney disease Neg Hx   . Liver disease Neg Hx   . Osteoporosis Neg Hx      Social History   Social History  . Marital status: Married    Spouse name: Ollen Gross  . Number of children: 4  . Years of education: N/A   Occupational History  .  Retired   Social History Main Topics  . Smoking status: Former Games developer  . Smokeless tobacco: Never Used     Comment: x 6 months in high school  . Alcohol use  No     Comment: none  . Drug use: No  . Sexual activity: No   Other Topics Concern  . Not on file   Social History Narrative   Married.  Ambulates with a walker.     BP 110/68   Pulse 72   Ht 5\' 11"  (1.803 m)   Physical Exam:  Frail but stable appearing 82 year old man,NAD HEENT: Unremarkable Neck:  7 cm JVD, no thyromegally  Back:  No CVA tenderness Lungs:  Clear with no wheezes, rales, or rhonchi. No increased work of breathing. Well healed PPM incision. HEART:  Regular rate rhythm, no murmurs, no rubs, no clicks Abd:  soft, positive bowel sounds, no organomegally, no rebound, no guarding Ext:  2 plus pulses, no edema, no cyanosis, no clubbing Skin:  No rashes no nodules Neuro:  CN II through XII intact, motor grossly intact   DEVICE  Normal device function.  See PaceArt for details.   Assess/Plan: 1. Atrial fib - his rate is controlled. He is tolerating his anti-coagulation. 2. HTN - his blood pressure is well controlled. 3. PPM - his medtronic VVI PM is working normally. Will recheck in several months.  Leonia Reeves.D.

## 2016-08-20 ENCOUNTER — Other Ambulatory Visit: Payer: Self-pay

## 2016-08-24 DIAGNOSIS — S32010A Wedge compression fracture of first lumbar vertebra, initial encounter for closed fracture: Secondary | ICD-10-CM | POA: Diagnosis not present

## 2016-08-24 NOTE — Progress Notes (Signed)
HPI: FU CAD s/p CABG, atrial fibrillation, HTN. Per notes, he had CABG 1993 with LIMA- LAD, SVG to OM, SVG to left circumflex, SVG to PD/PLSA. He had PCI of the saphenous vein graft to his obtuse marginal in 2003. Abdominal CT in June 2011 showed no abdominal aortic aneurysm. He was hospitalized February 2015 for GIB with Hgb down to 7.6 (was 11-12 in December). He was treated with Vit K and PRBCs. Coumadin was held. Last echocardiogram April 2015 showed an ejection fraction of 50-55%, mild mitral regurgitation and trace aortic insufficiency. Now on apixaban. Since he was last seen, He denies dyspnea, chest pain, palpitations [name] syncope.  Current Outpatient Prescriptions  Medication Sig Dispense Refill  . Biotin 1000 MCG tablet Take 1,000 mcg by mouth daily.    . Calcium-Magnesium-Vitamin D (CITRACAL CALCIUM+D) 600-40-500 MG-MG-UNIT TB24 Take 2 tablets by mouth daily.     . cyanocobalamin (,VITAMIN B-12,) 1000 MCG/ML injection Inject 1,000 mcg into the muscle every 21 ( twenty-one) days.    . Doxepin HCl (SILENOR) 6 MG TABS Take 1 tablet by mouth at bedtime.    [name] Kitchen ELIQUIS 5 MG TABS tablet Take 1 tablet by mouth two  times daily 180 tablet 1  . fentaNYL (DURAGESIC - DOSED MCG/HR) 25 MCG/HR patch Place 1 patch (25 mcg total) onto the skin every 3 (three) days. 10 patch 0  . fexofenadine (ALLEGRA) 180 MG tablet Take 180 mg by mouth at bedtime as needed (for allergies).     . finasteride (PROSCAR) 5 MG tablet Take 1 tablet by mouth at  bedtime 90 tablet 3  . metoprolol tartrate (LOPRESSOR) 25 MG tablet Take 1 tablet by mouth  twice a day 180 tablet 1  . NAMZARIC 7 & 14 & 21 &28 -10 MG C4PK Take 1 tablet by mouth daily.  3  . nitroGLYCERIN (NITROSTAT) 0.4 MG SL tablet Place 1 tablet (0.4 mg total) under the tongue every 5 (five) minutes x 3 doses as needed for chest pain. 25 tablet 1  . omeprazole (PRILOSEC) 40 MG capsule Take 40 mg by mouth daily.      . polyethylene glycol (MIRALAX / GLYCOLAX)  packet Take 17 g by mouth daily as needed for moderate constipation.     [name] Kitchen PROLIA 60 MG/ML SOLN injection Inject 60 mg into the skin every 6 (six) months.     . terazosin (HYTRIN) 5 MG capsule Take 1 capsule by mouth  daily 90 capsule 3   No current facility-administered medications for this visit.      Past Medical History:  Diagnosis Date  . Allergic rhinitis, cause unspecified   . Arthritis   . Atrial fibrillation (HCC)    a. Was on Coumadin until GIB 01/2014.  . Bronchiectasis without acute exacerbation (HCC)   . Coronary artery disease    a. s/p CABG 1993. b. s/p PCI of SVG to OM '03.  . Disorders of diaphragm   . Diverticulosis   . Epistaxis   . GERD (gastroesophageal reflux disease)   . H/O: GI bleed    a. 01/2014 - presumed diverticular. Received PRBC, Vit K. Coumadin put on hold.  . Hyperlipidemia   . Hypertension   . LV dysfunction    a. Echo 08/2013: EF 45-50%.  . Myocardial infarction (HCC)   . Plantar fascial fibromatosis   . Spinal stenosis, unspecified region other than cervical   . Tachy-brady syndrome [facility])    s/p Medtronic PPM '07  . Unspecified  sinusitis (chronic)     Past Surgical History:  Procedure Laterality Date  . CATARACT EXTRACTION W/ INTRAOCULAR LENS  IMPLANT, BILATERAL    . CHOLECYSTECTOMY    . COLONOSCOPY    . CORONARY ARTERY BYPASS GRAFT     LIMA to LAD, SVG to OM, SVG to left circumflex, SVG to PD/PLSA  . DOPPLER ECHOCARDIOGRAPHY  2011  . HIP ARTHROPLASTY Right 12/10/2013   Procedure: Right Hip Cemented Hemiarthroplasty;  Surgeon: Dwayne MangesMark C Yates, MD;  Location: [name];  Service: Orthopedics;  Laterality: Right;  Right Hip Cemented Hemiarthroplasty  . KIDNEY CYST REMOVAL    . PACEMAKER INSERTION     2007, Medtronic dual-chamber  . PERMANENT PACEMAKER GENERATOR CHANGE N/A 08/14/2014   Procedure: PERMANENT PACEMAKER GENERATOR CHANGE;  Surgeon: Dwayne MawGregg W Taylor, MD;  [facility];  Service: Cardiovascular;  Laterality: N/A;  .  VERTEBROPLASTY      Social History   Social History  . Marital status: Married    Spouse name: Dwayne [name]  . Number of children: 4  . Years of education: N/A   Occupational History  .  Retired   Social History Main Topics  . Smoking status: Former Games developermoker  . Smokeless tobacco: Never Used     Comment: x 6 months in high school  . Alcohol use No     Comment: none  . Drug use: No  . Sexual activity: No   Other Topics Concern  . Not on file   Social History Narrative   Married.  Ambulates with a walker.    Family History  Problem Relation Age of Onset  . Stroke Mother   . Diabetes Son   . Colon cancer Neg Hx   . Heart disease Neg Hx   . Throat cancer Neg Hx   . Pancreatic cancer Neg Hx   . Kidney disease Neg Hx   . Liver disease Neg Hx   . Osteoporosis Neg Hx     ROS:  Chronic back pain but no fevers [name] chills, productive cough, hemoptysis, dysphasia, odynophagia, melena, hematochezia, dysuria, hematuria, rash, seizure activity, orthopnea, PND, pedal edema, claudication. Remaining systems are negative.  Physical Exam: Well-developed frail in no acute distress.  Skin is warm and dry.  HEENT is normal.  Neck is supple.  Chest is clear to auscultation with normal expansion.  Cardiovascular exam is regular rate and rhythm.  Abdominal exam nontender [name] distended. No masses palpated. Extremities show no edema. neuro grossly intact  ECG  A/P  1 Atrial fibrillation-continue apixaban. Continue beta blocker.  2 pacemaker-management per electrophysiology.  3 coronary artery disease-Statin DCed by primary care due to worsening dementia. No aspirin given need for anticoagulation.  4 hypertension-blood pressure controlled. Continue present medications.  5 hyperlipidemia-continue Diet. Statin discontinued because of worsening dementia.  Dwayne MillersBrian Hans Rusher, MD

## 2016-08-26 ENCOUNTER — Encounter: Payer: Self-pay | Admitting: Cardiology

## 2016-08-26 ENCOUNTER — Ambulatory Visit (INDEPENDENT_AMBULATORY_CARE_PROVIDER_SITE_OTHER): Payer: Medicare Other | Admitting: Cardiology

## 2016-08-26 VITALS — BP 100/54 | HR 84 | Ht 71.0 in | Wt 165.0 lb

## 2016-08-26 DIAGNOSIS — I2584 Coronary atherosclerosis due to calcified coronary lesion: Secondary | ICD-10-CM | POA: Diagnosis not present

## 2016-08-26 DIAGNOSIS — I2510 Atherosclerotic heart disease of native coronary artery without angina pectoris: Secondary | ICD-10-CM

## 2016-08-26 DIAGNOSIS — E785 Hyperlipidemia, unspecified: Secondary | ICD-10-CM

## 2016-08-26 DIAGNOSIS — I10 Essential (primary) hypertension: Secondary | ICD-10-CM

## 2016-08-26 DIAGNOSIS — I482 Chronic atrial fibrillation, unspecified: Secondary | ICD-10-CM

## 2016-08-26 NOTE — Patient Instructions (Signed)
Medication Instructions:   NO CHANGES.   Follow-Up: Your physician wants you to follow-up in: 6 MONTHS WITH DR CRENSHAW.  You will receive a reminder letter in the mail two months in advance. If you don't receive a letter, please call our office to schedule the follow-up appointment.   If you need a refill on your cardiac medications before your next appointment, please call your pharmacy.   

## 2016-09-01 DIAGNOSIS — N3941 Urge incontinence: Secondary | ICD-10-CM | POA: Diagnosis not present

## 2016-09-01 DIAGNOSIS — N401 Enlarged prostate with lower urinary tract symptoms: Secondary | ICD-10-CM | POA: Diagnosis not present

## 2016-09-03 ENCOUNTER — Telehealth: Payer: Self-pay | Admitting: Internal Medicine

## 2016-09-03 ENCOUNTER — Ambulatory Visit (INDEPENDENT_AMBULATORY_CARE_PROVIDER_SITE_OTHER): Payer: Medicare Other

## 2016-09-03 DIAGNOSIS — D518 Other vitamin B12 deficiency anemias: Secondary | ICD-10-CM

## 2016-09-03 MED ORDER — CYANOCOBALAMIN 1000 MCG/ML IJ SOLN
1000.0000 ug | Freq: Once | INTRAMUSCULAR | Status: AC
  Administered 2016-09-03: 1000 ug via INTRAMUSCULAR

## 2016-09-03 NOTE — Telephone Encounter (Signed)
Needs refill of namzaric to be sent to [facility]

## 2016-09-04 MED ORDER — NAMZARIC 7 & 14 & 21 &28 -10 MG PO C4PK: 1.0000 | EXTENDED_RELEASE_CAPSULE | Freq: Every day | ORAL | 1 refills | Status: DC

## 2016-09-04 NOTE — Telephone Encounter (Signed)
erx sent

## 2016-09-04 NOTE — Progress Notes (Signed)
Injection given.   Dwayne Mula J Kaisei Gilbo, MD  

## 2016-09-08 MED ORDER — NAMZARIC 7 & 14 & 21 &28 -10 MG PO C4PK: 1.0000 | EXTENDED_RELEASE_CAPSULE | Freq: Every day | ORAL | 0 refills | Status: DC

## 2016-09-08 NOTE — Addendum Note (Signed)
Addended by: Radford PaxAIRRIKIER DAVIDSON, Maryanne Huneycutt M on: 09/08/2016 04:15 PM   Modules accepted: Orders

## 2016-09-08 NOTE — Telephone Encounter (Signed)
Received fax from [facility]. Titration pack - we sent 3 month supply. Needs to be 1 month supply. Faxed sent with same information. Signed by AVP in PCP absence.

## 2016-09-20 ENCOUNTER — Other Ambulatory Visit: Payer: Self-pay | Admitting: Cardiology

## 2016-09-20 ENCOUNTER — Other Ambulatory Visit: Payer: Self-pay | Admitting: Internal Medicine

## 2016-09-20 DIAGNOSIS — G4700 Insomnia, unspecified: Secondary | ICD-10-CM

## 2016-09-20 NOTE — Telephone Encounter (Signed)
Rx request sent to pharmacy.  

## 2016-09-24 ENCOUNTER — Ambulatory Visit (INDEPENDENT_AMBULATORY_CARE_PROVIDER_SITE_OTHER): Payer: Medicare Other

## 2016-09-24 DIAGNOSIS — M810 Age-related osteoporosis without current pathological fracture: Secondary | ICD-10-CM

## 2016-09-24 DIAGNOSIS — E538 Deficiency of other specified B group vitamins: Secondary | ICD-10-CM

## 2016-09-24 MED ORDER — CYANOCOBALAMIN 1000 MCG/ML IJ SOLN
1000.0000 ug | Freq: Once | INTRAMUSCULAR | Status: AC
  Administered 2016-09-24: 1000 ug via INTRAMUSCULAR

## 2016-09-24 MED ORDER — DENOSUMAB 60 MG/ML ~~LOC~~ SOLN
60.0000 mg | Freq: Once | SUBCUTANEOUS | Status: AC
  Administered 2016-09-24: 60 mg via SUBCUTANEOUS

## 2016-09-24 NOTE — Addendum Note (Signed)
Addended by: Alonna MiniumWILLIAMS, TAMARA P on: 09/24/2016 02:19 PM   Modules accepted: Orders

## 2016-09-25 NOTE — Progress Notes (Signed)
Injection given.   Stacy J Burns, MD  

## 2016-09-28 ENCOUNTER — Ambulatory Visit (INDEPENDENT_AMBULATORY_CARE_PROVIDER_SITE_OTHER): Payer: Medicare Other | Admitting: Orthopaedic Surgery

## 2016-09-28 DIAGNOSIS — M545 Low back pain: Secondary | ICD-10-CM | POA: Diagnosis not present

## 2016-10-05 ENCOUNTER — Telehealth: Payer: Self-pay

## 2016-10-05 MED ORDER — OMEPRAZOLE 40 MG PO CPDR: 40.0000 mg | DELAYED_RELEASE_CAPSULE | Freq: Every day | ORAL | 1 refills | Status: DC

## 2016-10-05 NOTE — Telephone Encounter (Signed)
[facility] rq rf of omeprazole. erx sent.

## 2016-10-18 ENCOUNTER — Ambulatory Visit (INDEPENDENT_AMBULATORY_CARE_PROVIDER_SITE_OTHER): Payer: Medicare Other

## 2016-10-18 DIAGNOSIS — E538 Deficiency of other specified B group vitamins: Secondary | ICD-10-CM | POA: Diagnosis not present

## 2016-10-18 MED ORDER — CYANOCOBALAMIN 1000 MCG/ML IJ SOLN
1000.0000 ug | Freq: Once | INTRAMUSCULAR | Status: AC
  Administered 2016-10-18: 1000 ug via INTRAMUSCULAR

## 2016-10-20 DIAGNOSIS — Z23 Encounter for immunization: Secondary | ICD-10-CM | POA: Diagnosis not present

## 2016-10-29 ENCOUNTER — Other Ambulatory Visit: Payer: Self-pay | Admitting: Internal Medicine

## 2016-10-29 ENCOUNTER — Other Ambulatory Visit: Payer: Self-pay | Admitting: Cardiology

## 2016-11-06 ENCOUNTER — Other Ambulatory Visit (INDEPENDENT_AMBULATORY_CARE_PROVIDER_SITE_OTHER): Payer: Self-pay | Admitting: Surgery

## 2016-11-18 ENCOUNTER — Encounter: Payer: Medicare Other | Admitting: *Deleted

## 2016-11-18 ENCOUNTER — Ambulatory Visit (INDEPENDENT_AMBULATORY_CARE_PROVIDER_SITE_OTHER): Payer: Medicare Other | Admitting: General Practice

## 2016-11-18 DIAGNOSIS — E538 Deficiency of other specified B group vitamins: Secondary | ICD-10-CM | POA: Diagnosis not present

## 2016-11-18 MED ORDER — CYANOCOBALAMIN 1000 MCG/ML IJ SOLN
1000.0000 ug | Freq: Once | INTRAMUSCULAR | Status: AC
  Administered 2016-11-18: 1000 ug via INTRAMUSCULAR

## 2016-11-19 ENCOUNTER — Telehealth: Payer: Self-pay | Admitting: Cardiology

## 2016-11-19 NOTE — Telephone Encounter (Signed)
Confirmed remote transmission w/ pt wife. She informed me that pt is waiting on a new piece and they will send the transmission once they receive it.

## 2016-11-20 ENCOUNTER — Other Ambulatory Visit: Payer: Self-pay | Admitting: Internal Medicine

## 2016-11-20 DIAGNOSIS — I2584 Coronary atherosclerosis due to calcified coronary lesion: Secondary | ICD-10-CM

## 2016-11-20 DIAGNOSIS — I25709 Atherosclerosis of coronary artery bypass graft(s), unspecified, with unspecified angina pectoris: Secondary | ICD-10-CM

## 2016-11-20 DIAGNOSIS — E785 Hyperlipidemia, unspecified: Secondary | ICD-10-CM

## 2016-11-20 DIAGNOSIS — I2510 Atherosclerotic heart disease of native coronary artery without angina pectoris: Secondary | ICD-10-CM

## 2016-11-24 DIAGNOSIS — H35371 Puckering of macula, right eye: Secondary | ICD-10-CM | POA: Diagnosis not present

## 2016-11-24 DIAGNOSIS — H353132 Nonexudative age-related macular degeneration, bilateral, intermediate dry stage: Secondary | ICD-10-CM | POA: Diagnosis not present

## 2016-11-30 ENCOUNTER — Encounter: Payer: Self-pay | Admitting: Cardiology

## 2016-12-15 ENCOUNTER — Ambulatory Visit (INDEPENDENT_AMBULATORY_CARE_PROVIDER_SITE_OTHER): Payer: Medicare Other

## 2016-12-15 DIAGNOSIS — E538 Deficiency of other specified B group vitamins: Secondary | ICD-10-CM

## 2016-12-15 MED ORDER — CYANOCOBALAMIN 1000 MCG/ML IJ SOLN
1000.0000 ug | Freq: Once | INTRAMUSCULAR | Status: AC
  Administered 2016-12-15: 1000 ug via INTRAMUSCULAR

## 2016-12-15 NOTE — Progress Notes (Signed)
Injection given.   Chyla Schlender J Akeyla Molden, MD  

## 2016-12-20 NOTE — Progress Notes (Addendum)
Subjective:   Dwayne [name] is a 83 y.o. male who presents for Medicare Annual/Subsequent preventive examination accompanied by his wife.   The Patient was informed that the wellness visit is to identify future health risk and educate and initiate measures that can reduce risk for increased disease through the lifespan.   Review of Systems:  No ROS.  Medicare Wellness Visit.  Cardiac Risk Factors include: advanced age (>2555men, 74>65 women);dyslipidemia;family history of premature cardiovascular disease;hypertension;male gender   Sleep patterns: Sleeps 7-8 hours.  Home Safety/Smoke Alarms:  Smoke detector and alarm system in place.  Living environment; residence and Firearm Safety: Lives with wife in single story home. 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 Firearms locked away.  Seat Belt Safety/Bike Helmet: Wears seat belt.    Counseling:   Eye Exam-Last exam 11/2016, yearly Dwayne [name]  Dental-Last exam 03/2016, yearly Dwayne [name]   Male:   CCS-Colonoscopy 09/24/2009, normal.      PSA-02/07/2015, 3.29.  DEXA-12/02/2014, Osteoporosis. Prolia injections by Dr. Corliss [name]. Recall 2 years, states Dwayne [name] orders.     Objective:    Vitals: BP 126/60 (BP Location: Right Arm, Patient Position: Sitting, Cuff Size: Normal)   Pulse 85   Ht 5\' 11"  (1.803 m)   Wt 144 lb 1.3 oz (65.4 kg)   SpO2 98%   BMI 20.10 kg/m   Body mass index is 20.1 kg/m.  Tobacco History  Smoking Status  . Former Smoker  Smokeless Tobacco  . Never Used    Comment: x 6 months in high school     Counseling given: Not Answered   Past Medical History:  Diagnosis Date  . Allergic rhinitis, cause unspecified   . Arthritis   . Atrial fibrillation (HCC)    a. Was on Coumadin until GIB 01/2014.  . Bronchiectasis without acute exacerbation (HCC)   . Coronary artery disease    a. s/p CABG 1993. b. s/p PCI of SVG to OM '03.  . Disorders of diaphragm   . Diverticulosis   .  Epistaxis   . GERD (gastroesophageal reflux disease)   . H/O: GI bleed    a. 01/2014 - presumed diverticular. Received PRBC, Vit K. Coumadin put on hold.  . Hyperlipidemia   . Hypertension   . LV dysfunction    a. Echo 08/2013: EF 45-50%.  . Myocardial infarction   . Plantar fascial fibromatosis   . Spinal stenosis, unspecified region other than cervical   . Tachy-brady syndrome [facility](HCC)    s/p Medtronic PPM '07  . Unspecified sinusitis (chronic)    Past Surgical History:  Procedure Laterality Date  . CATARACT EXTRACTION W/ INTRAOCULAR LENS  IMPLANT, BILATERAL    . CHOLECYSTECTOMY    . COLONOSCOPY    . CORONARY ARTERY BYPASS GRAFT     LIMA to LAD, SVG to OM, SVG to left circumflex, SVG to PD/PLSA  . DOPPLER ECHOCARDIOGRAPHY  2011  . HIP ARTHROPLASTY Right 12/10/2013   Procedure: Right Hip Cemented Hemiarthroplasty;  Surgeon: Dwayne MangesMark C Yates, MD;  [facility];  Service: Orthopedics;  Laterality: Right;  Right Hip Cemented Hemiarthroplasty  . KIDNEY CYST REMOVAL    . PACEMAKER INSERTION     2007, Medtronic dual-chamber  . PERMANENT PACEMAKER GENERATOR CHANGE N/A 08/14/2014   Procedure: PERMANENT PACEMAKER GENERATOR CHANGE;  Surgeon: Dwayne MawGregg W Taylor, MD;  [facility];  Service: Cardiovascular;  Laterality: N/A;  . VERTEBROPLASTY     Family History  Problem Relation  Age of Onset  . Stroke Mother   . Diabetes Son   . Cancer Son   . Colon cancer Neg Hx   . Heart disease Neg Hx   . Throat cancer Neg Hx   . Pancreatic cancer Neg Hx   . Kidney disease Neg Hx   . Liver disease Neg Hx   . Osteoporosis Neg Hx    History  Sexual Activity  . Sexual activity: No    Outpatient Encounter Prescriptions as of 12/21/2016  Medication Sig  . atorvastatin (LIPITOR) 40 MG tablet TAKE 1 TABLET BY MOUTH  DAILY  . Biotin 1000 MCG tablet Take 1,000 mcg by mouth daily.  . Calcium-Magnesium-Vitamin D (CITRACAL CALCIUM+D) 600-40-500 MG-MG-UNIT TB24 Take 2 tablets by mouth daily.   .  cyanocobalamin (,VITAMIN B-12,) 1000 MCG/ML injection Inject 1,000 mcg into the muscle every 21 ( twenty-one) days.  . Doxepin HCl (SILENOR) 6 MG TABS Take 1 tablet by mouth at bedtime.  [name] Kitchen ELIQUIS 5 MG TABS tablet TAKE 1 TABLET BY MOUTH TWO  TIMES DAILY  . fentaNYL (DURAGESIC - DOSED MCG/HR) 25 MCG/HR patch Place 1 patch (25 mcg total) onto the skin every 3 (three) days.  . fexofenadine (ALLEGRA) 180 MG tablet Take 180 mg by mouth at bedtime as needed (for allergies).   . finasteride (PROSCAR) 5 MG tablet TAKE 1 TABLET BY MOUTH AT  BEDTIME  . metoprolol tartrate (LOPRESSOR) 25 MG tablet TAKE 1 TABLET BY MOUTH  TWICE A DAY  . NAMZARIC 7 & 14 & 21 &28 -10 MG C4PK Take 1 tablet by mouth daily.  . nitroGLYCERIN (NITROSTAT) 0.4 MG SL tablet Place 1 tablet (0.4 mg total) under the tongue every 5 (five) minutes x 3 doses as needed for chest pain.  [name] Kitchen omeprazole (PRILOSEC) 40 MG capsule Take 1 capsule (40 mg total) by mouth daily.  . polyethylene glycol (MIRALAX / GLYCOLAX) packet Take 17 g by mouth daily as needed for moderate constipation.   [name] Kitchen PROLIA 60 MG/ML SOLN injection Inject 60 mg into the skin every 6 (six) months.   [name] Kitchen SILENOR 6 MG TABS TAKE 1 TABLET BY MOUTH AT  BEDTIME AS NEEDED  . terazosin (HYTRIN) 5 MG capsule TAKE 1 CAPSULE BY MOUTH  DAILY   No facility-administered encounter medications on file as of 12/21/2016.     Activities of Daily Living In your present state of health, do you have any difficulty performing the following activities: 12/21/2016  Hearing? N  Vision? N  Difficulty concentrating or making decisions? N  Walking or climbing stairs? Y  Dressing or bathing? N  Doing errands, shopping? N  Preparing Food and eating ? N  Using the Toilet? N  In the past six months, have you accidently leaked urine? N  Do you have problems with loss of bowel control? N  Managing your Medications? N  Managing your Finances? N  Housekeeping or managing your Housekeeping? N  Some recent  data might be hidden   Walks with walker. Has CNA in home during the day to assist with ADL's.   Patient Care Team: [name], MD as PCP - General (Internal Medicine) Pollyann Savoy, MD as Consulting Physician (Rheumatology) Valeria Batman, MD as Consulting Physician (Orthopedic Surgery) Dwayne Manges, MD as Consulting Physician (Orthopedic Surgery) [name], MD as Consulting Physician (Cardiology) Nelson Chimes, MD as Consulting Physician (Ophthalmology) Dwayne Felling (Dentistry)   Assessment:    Physical assessment deferred to PCP.  Exercise Activities and Dietary  recommendations Current Exercise Habits: The patient does not participate in regular exercise at present, Exercise limited by: orthopedic condition(s)   Diet (meal preparation, eat out, water intake, caffeinated beverages, dairy products, fruits and vegetables): Eats out rarely. Drinks sweet tea.   Breakfast: eggs, meat, coffee, juice, oatmeal, tomato Lunch: sandwich, soups, leftovers Dinner: meat and vegetables.   Discussed maintaining eating habits and activity level.   Goals      Patient Stated   . <enter goal here> (pt-stated)          Maintain current health status.       Fall Risk Fall Risk  12/21/2016 08/20/2016 06/16/2015 03/12/2015 03/11/2015  Falls in the past year? Yes Yes No No No  Number falls in past yr: 2 or more 1 - - -  Injury with Fall? No No - - -  Risk for fall due to : Impaired balance/gait - - - -  Follow up Falls prevention discussed - - - -   Depression Screen PHQ 2/9 Scores 12/21/2016 06/16/2015 03/12/2015 03/11/2015  PHQ - 2 Score 0 0 0 0    Cognitive Function       Ad8 score reviewed for issues:  Issues making decisions:no  Less interest in hobbies / activities:no  Repeats questions, stories (family complaining):no  Trouble using ordinary gadgets (microwave, computer, phone):no  Forgets the month or year: no  Mismanaging finances: no  Remembering appts:no  Daily  problems with thinking and/or memory:no Ad8 score is=0  Pt manages finances and is actively engaged. Able to provide day, date and year. Recalled 3 objects.    Immunization History  Administered Date(s) Administered  . Influenza Split 11/25/2011  . Influenza Whole 09/26/2013  . Influenza-Unspecified 07/20/2014, 09/19/2016  . Pneumococcal Conjugate-13 09/18/2014  . Pneumococcal Polysaccharide-23 12/28/2011  . Tdap 07/20/2011  . Zoster 05/25/2012   Screening Tests Health Maintenance  Topic Date Due  . TETANUS/TDAP  07/19/2021  . INFLUENZA VACCINE  Addressed  . ZOSTAVAX  Addressed  . PNA vac Low Risk Adult  Completed      Plan:     Eat heart healthy diet (full of fruits, vegetables, whole grains, lean protein, water--limit salt, fat, and sugar intake) and increase physical activity as tolerated.  Continue doing brain stimulating activities (puzzles, reading, adult coloring books, staying active) to keep memory sharp.   During the course of the visit the patient was educated and counseled about the following appropriate screening and preventive services:   Vaccines to include Pneumoccal, Influenza, Hepatitis B, Td, Zostavax, HCV  Cardiovascular Disease  Colorectal cancer screening  Diabetes screening  Prostate Cancer Screening  Glaucoma screening  Nutrition counseling    Patient Instructions (the written plan) was given to the patient.   Medical screening examination/treatment/procedure(s) were performed by non-physician practitioner and as supervising physician I was immediately available for consultation/collaboration. I agree with above. Sanda Linger, MD   Alysia Penna, RN  12/21/2016

## 2016-12-20 NOTE — Progress Notes (Signed)
Pre visit review using our clinic review tool, if applicable. No additional management support is needed unless otherwise documented below in the visit note. 

## 2016-12-21 ENCOUNTER — Other Ambulatory Visit: Payer: Self-pay | Admitting: Internal Medicine

## 2016-12-21 ENCOUNTER — Telehealth: Payer: Self-pay

## 2016-12-21 ENCOUNTER — Ambulatory Visit (INDEPENDENT_AMBULATORY_CARE_PROVIDER_SITE_OTHER): Payer: Medicare Other

## 2016-12-21 VITALS — BP 126/60 | HR 85 | Ht 71.0 in | Wt 144.1 lb

## 2016-12-21 DIAGNOSIS — F5101 Primary insomnia: Secondary | ICD-10-CM

## 2016-12-21 DIAGNOSIS — Z0000 Encounter for general adult medical examination without abnormal findings: Secondary | ICD-10-CM

## 2016-12-21 MED ORDER — DOXEPIN HCL 6 MG PO TABS: 1.0000 | ORAL_TABLET | Freq: Every evening | ORAL | 3 refills | Status: DC | PRN

## 2016-12-21 NOTE — Telephone Encounter (Signed)
Patient in for AWV today, requesting refill for Silenor 6 mg. Advised patients wife there should be refills available, she reports the pharmacist is requesting a new prescription due to insurance change. Wife unsure if medication is covered with new insurance.

## 2016-12-21 NOTE — Patient Instructions (Addendum)
Eat heart healthy diet (full of fruits, vegetables, whole grains, lean protein, water--limit salt, fat, and sugar intake) and increase physical activity as tolerated.  Continue doing brain stimulating activities (puzzles, reading, adult coloring books, staying active) to keep memory sharp.    Fall Prevention in the Home Introduction Falls can cause injuries. They can happen to people of all ages. There are many things you can do to make your home safe and to help prevent falls. What can I do on the outside of my home?  Regularly fix the edges of walkways and driveways and fix any cracks.  Remove anything that might make you trip as you walk through a door, such as a raised step or threshold.  Trim any bushes or trees on the path to your home.  Use bright outdoor lighting.  Clear any walking paths of anything that might make someone trip, such as rocks or tools.  Regularly check to see if handrails are loose or broken. Make sure that both sides of any steps have handrails.  Any raised decks and porches should have guardrails on the edges.  Have any leaves, snow, or ice cleared regularly.  Use sand or salt on walking paths during winter.  Clean up any spills in your garage right away. This includes oil or grease spills. What can I do in the bathroom?  Use night lights.  Install grab bars by the toilet and in the tub and shower. Do not use towel bars as grab bars.  Use non-skid mats or decals in the tub or shower.  If you need to sit down in the shower, use a plastic, non-slip stool.  Keep the floor dry. Clean up any water that spills on the floor as soon as it happens.  Remove soap buildup in the tub or shower regularly.  Attach bath mats securely with double-sided non-slip rug tape.  Do not have throw rugs and other things on the floor that can make you trip. What can I do in the bedroom?  Use night lights.  Make sure that you have a light by your bed that is easy to  reach.  Do not use any sheets or blankets that are too big for your bed. They should not hang down onto the floor.  Have a firm chair that has side arms. You can use this for support while you get dressed.  Do not have throw rugs and other things on the floor that can make you trip. What can I do in the kitchen?  Clean up any spills right away.  Avoid walking on wet floors.  Keep items that you use a lot in easy-to-reach places.  If you need to reach something above you, use a strong step stool that has a grab bar.  Keep electrical cords out of the way.  Do not use floor polish or wax that makes floors slippery. If you must use wax, use non-skid floor wax.  Do not have throw rugs and other things on the floor that can make you trip. What can I do with my stairs?  Do not leave any items on the stairs.  Make sure that there are handrails on both sides of the stairs and use them. Fix handrails that are broken or loose. Make sure that handrails are as long as the stairways.  Check any carpeting to make sure that it is firmly attached to the stairs. Fix any carpet that is loose or worn.  Avoid having throw rugs at   the top or bottom of the stairs. If you do have throw rugs, attach them to the floor with carpet tape.  Make sure that you have a light switch at the top of the stairs and the bottom of the stairs. If you do not have them, ask someone to add them for you. What else can I do to help prevent falls?  Wear shoes that:  Do not have high heels.  Have rubber bottoms.  Are comfortable and fit you well.  Are closed at the toe. Do not wear sandals.  If you use a stepladder:  Make sure that it is fully opened. Do not climb a closed stepladder.  Make sure that both sides of the stepladder are locked into place.  Ask someone to hold it for you, if possible.  Clearly mark and make sure that you can see:  Any grab bars or handrails.  First and last steps.  Where the  edge of each step is.  Use tools that help you move around (mobility aids) if they are needed. These include:  Canes.  Walkers.  Scooters.  Crutches.  Turn on the lights when you go into a dark area. Replace any light bulbs as soon as they burn out.  Set up your furniture so you have a clear path. Avoid moving your furniture around.  If any of your floors are uneven, fix them.  If there are any pets around you, be aware of where they are.  Review your medicines with your doctor. Some medicines can make you feel dizzy. This can increase your chance of falling. Ask your doctor what other things that you can do to help prevent falls. This information is not intended to replace advice given to you by your health care provider. Make sure you discuss any questions you have with your health care provider. Document Released: 10/02/2009 Document Revised: 05/13/2016 Document Reviewed: 01/10/2015  2017 Elsevier  Health Maintenance, Male A healthy lifestyle and preventative care can promote health and wellness.  Maintain regular health, dental, and eye exams.  Eat a healthy diet. Foods like vegetables, fruits, whole grains, low-fat dairy products, and lean protein foods contain the nutrients you need and are low in calories. Decrease your intake of foods high in solid fats, added sugars, and salt. Get information about a proper diet from your health care provider, if necessary.  Regular physical exercise is one of the most important things you can do for your health. Most adults should get at least 150 minutes of moderate-intensity exercise (any activity that increases your heart rate and causes you to sweat) each week. In addition, most adults need muscle-strengthening exercises on 2 or more days a week.   Maintain a healthy weight. The body mass index (BMI) is a screening tool to identify possible weight problems. It provides an estimate of body fat based on height and weight. Your health  care provider can find your BMI and can help you achieve or maintain a healthy weight. For males 20 years and older:  A BMI below 18.5 is considered underweight.  A BMI of 18.5 to 24.9 is normal.  A BMI of 25 to 29.9 is considered overweight.  A BMI of 30 and above is considered obese.  Maintain normal blood lipids and cholesterol by exercising and minimizing your intake of saturated fat. Eat a balanced diet with plenty of fruits and vegetables. Blood tests for lipids and cholesterol should begin at age 20 and be repeated every 5 years.   If your lipid or cholesterol levels are high, you are over age 50, or you are at high risk for heart disease, you may need your cholesterol levels checked more frequently.Ongoing high lipid and cholesterol levels should be treated with medicines if diet and exercise are not working.  If you smoke, find out from your health care provider how to quit. If you do not use tobacco, do not start.  Lung cancer screening is recommended for adults aged 55-80 years who are at high risk for developing lung cancer because of a history of smoking. A yearly low-dose CT scan of the lungs is recommended for people who have at least a 30-pack-year history of smoking and are current smokers or have quit within the past 15 years. A pack year of smoking is smoking an average of 1 pack of cigarettes a day for 1 year (for example, a 30-pack-year history of smoking could mean smoking 1 pack a day for 30 years or 2 packs a day for 15 years). Yearly screening should continue until the smoker has stopped smoking for at least 15 years. Yearly screening should be stopped for people who develop a health problem that would prevent them from having lung cancer treatment.  If you choose to drink alcohol, do not have more than 2 drinks per day. One drink is considered to be 12 oz (360 mL) of beer, 5 oz (150 mL) of wine, or 1.5 oz (45 mL) of liquor.  Avoid the use of street drugs. Do not share  needles with anyone. Ask for help if you need support or instructions about stopping the use of drugs.  High blood pressure causes heart disease and increases the risk of stroke. High blood pressure is more likely to develop in:  People who have blood pressure in the end of the normal range (100-139/85-89 mm Hg).  People who are overweight or obese.  People who are African American.  If you are 18-39 years of age, have your blood pressure checked every 3-5 years. If you are 40 years of age or older, have your blood pressure checked every year. You should have your blood pressure measured twice-once when you are at a hospital or clinic, and once when you are not at a hospital or clinic. Record the average of the two measurements. To check your blood pressure when you are not at a hospital or clinic, you can use:  An automated blood pressure machine at a pharmacy.  A home blood pressure monitor.  If you are 45-79 years old, ask your health care provider if you should take aspirin to prevent heart disease.  Diabetes screening involves taking a blood sample to check your fasting blood sugar level. This should be done once every 3 years after age 45 if you are at a normal weight and without risk factors for diabetes. Testing should be considered at a younger age or be carried out more frequently if you are overweight and have at least 1 risk factor for diabetes.  Colorectal cancer can be detected and often prevented. Most routine colorectal cancer screening begins at the age of 50 and continues through age 75. However, your health care provider may recommend screening at an earlier age if you have risk factors for colon cancer. On a yearly basis, your health care provider may provide home test kits to check for hidden blood in the stool. A small camera at the end of a tube may be used to directly examine the colon (sigmoidoscopy   or colonoscopy) to detect the earliest forms of colorectal cancer. Talk  to your health care provider about this at age 50 when routine screening begins. A direct exam of the colon should be repeated every 5-10 years through age 75, unless early forms of precancerous polyps or small growths are found.  People who are at an increased risk for hepatitis B should be screened for this virus. You are considered at high risk for hepatitis B if:  You were born in a country where hepatitis B occurs often. Talk with your health care provider about which countries are considered high risk.  Your parents were born in a high-risk country and you have not received a shot to protect against hepatitis B (hepatitis B vaccine).  You have HIV or AIDS.  You use needles to inject street drugs.  You live with, or have sex with, someone who has hepatitis B.  You are a man who has sex with other men (MSM).  You get hemodialysis treatment.  You take certain medicines for conditions like cancer, organ transplantation, and autoimmune conditions.  Hepatitis C blood testing is recommended for all people born from 1945 through 1965 and any individual with known risk factors for hepatitis C.  Healthy men should no longer receive prostate-specific antigen (PSA) blood tests as part of routine cancer screening. Talk to your health care provider about prostate cancer screening.  Testicular cancer screening is not recommended for adolescents or adult males who have no symptoms. Screening includes self-exam, a health care provider exam, and other screening tests. Consult with your health care provider about any symptoms you have or any concerns you have about testicular cancer.  Practice safe sex. Use condoms and avoid high-risk sexual practices to reduce the spread of sexually transmitted infections (STIs).  You should be screened for STIs, including gonorrhea and chlamydia if:  You are sexually active and are younger than 24 years.  You are older than 24 years, and your health care  provider tells you that you are at risk for this type of infection.  Your sexual activity has changed since you were last screened, and you are at an increased risk for chlamydia or gonorrhea. Ask your health care provider if you are at risk.  If you are at risk of being infected with HIV, it is recommended that you take a prescription medicine daily to prevent HIV infection. This is called pre-exposure prophylaxis (PrEP). You are considered at risk if:  You are a man who has sex with other men (MSM).  You are a heterosexual man who is sexually active with multiple partners.  You take drugs by injection.  You are sexually active with a partner who has HIV.  Talk with your health care provider about whether you are at high risk of being infected with HIV. If you choose to begin PrEP, you should first be tested for HIV. You should then be tested every 3 months for as long as you are taking PrEP.  Use sunscreen. Apply sunscreen liberally and repeatedly throughout the day. You should seek shade when your shadow is shorter than you. Protect yourself by wearing long sleeves, pants, a wide-brimmed hat, and sunglasses year round whenever you are outdoors.  Tell your health care provider of new moles or changes in moles, especially if there is a change in shape or color. Also, tell your health care provider if a mole is larger than the size of a pencil eraser.  A one-time screening for abdominal   aortic aneurysm (AAA) and surgical repair of large AAAs by ultrasound is recommended for men aged 65-75 years who are current or former smokers.  Stay current with your vaccines (immunizations). This information is not intended to replace advice given to you by your health care provider. Make sure you discuss any questions you have with your health care provider. Document Released: 06/03/2008 Document Revised: 12/27/2014 Document Reviewed: 09/09/2015 Elsevier Interactive Patient Education  2017 Elsevier  Inc.  

## 2016-12-27 ENCOUNTER — Telehealth: Payer: Self-pay | Admitting: Internal Medicine

## 2016-12-27 ENCOUNTER — Other Ambulatory Visit: Payer: Self-pay | Admitting: Internal Medicine

## 2016-12-27 DIAGNOSIS — M19042 Primary osteoarthritis, left hand: Secondary | ICD-10-CM

## 2016-12-27 DIAGNOSIS — M19041 Primary osteoarthritis, right hand: Secondary | ICD-10-CM | POA: Insufficient documentation

## 2016-12-27 DIAGNOSIS — Z9229 Personal history of other drug therapy: Secondary | ICD-10-CM | POA: Insufficient documentation

## 2016-12-27 DIAGNOSIS — F5101 Primary insomnia: Secondary | ICD-10-CM

## 2016-12-27 DIAGNOSIS — Z8781 Personal history of (healed) traumatic fracture: Secondary | ICD-10-CM | POA: Insufficient documentation

## 2016-12-27 DIAGNOSIS — M4005 Postural kyphosis, thoracolumbar region: Secondary | ICD-10-CM | POA: Insufficient documentation

## 2016-12-27 DIAGNOSIS — M47816 Spondylosis without myelopathy or radiculopathy, lumbar region: Secondary | ICD-10-CM | POA: Insufficient documentation

## 2016-12-27 DIAGNOSIS — M170 Bilateral primary osteoarthritis of knee: Secondary | ICD-10-CM | POA: Insufficient documentation

## 2016-12-27 MED ORDER — DOXEPIN HCL 6 MG PO TABS: 1.0000 | ORAL_TABLET | Freq: Every evening | ORAL | 3 refills | Status: DC | PRN

## 2016-12-27 NOTE — Progress Notes (Signed)
Office Visit Note  Patient: Dwayne [name]             Date of Birth: 10/15/1932           MRN: [id number]             PCP: Sanda Linger, MD Referring: [name], MD Visit Date: 12/28/2016 Occupation: @GUAROCC @    Subjective:  Lower back pain   History of Present Illness: Dwayne [name] is a 83 y.o. male with history of osteoarthritis and osteoporosis. He states he is doing fairly well. The lower back pain is tolerable. He is not having much discomfort in his hands and some minimal discomfort in his knee joints. He is getting Prolia injections to his PCP.  Activities of Daily Living:  Patient reports morning stiffness for 10 minutes.   Patient Denies nocturnal pain.  Difficulty dressing/grooming: Reports Difficulty climbing stairs: Reports Difficulty getting out of chair: Reports Difficulty using hands for taps, buttons, cutlery, and/or writing: Denies   Review of Systems  Constitutional: Positive for fatigue. Negative for night sweats and weakness ( ).  HENT: Negative for mouth sores, mouth dryness and nose dryness.   Eyes: Negative for redness and dryness.  Respiratory: Negative for shortness of breath and difficulty breathing.   Cardiovascular: Negative for chest pain, palpitations, hypertension, irregular heartbeat and swelling in legs/feet.  Gastrointestinal: Negative for constipation and diarrhea.  Endocrine: Negative for increased urination.  Musculoskeletal: Positive for arthralgias, joint pain and morning stiffness. Negative for joint swelling, myalgias, muscle weakness, muscle tenderness and myalgias.  Skin: Negative for color change, rash, hair loss, nodules/bumps, skin tightness, ulcers and sensitivity to sunlight.  Allergic/Immunologic: Negative for susceptible to infections.  Neurological: Negative for dizziness, fainting, memory loss and night sweats.  Hematological: Negative for swollen glands.  Psychiatric/Behavioral: Negative for depressed mood and sleep  disturbance. The patient is not nervous/anxious.     PMFS History:  Patient Active Problem List   Diagnosis Date Noted  . History of anticoagulant therapy 12/27/2016  . 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 Postural kyphosis of thoracolumbar region 12/27/2016  . History of vertebral fracture 12/27/2016  . Primary osteoarthritis of both knees 12/27/2016  . Primary osteoarthritis of both hands 12/27/2016  . History of hip fracture 12/27/2016  . Dementia arising in the senium and presenium 05/06/2016  . Routine general medical examination at a health care facility 03/12/2015  . Insomnia 06/26/2014  . LV dysfunction   . Diverticulosis   . Tachy-brady syndrome (HCC)   . Coronary artery disease   . Anemia, iron deficiency 02/21/2014  . Encounter for therapeutic drug monitoring 01/17/2014  . Osteoporosis 01/04/2014  . Allergic rhinitis 01/04/2014  . Mild mitral regurgitation 02/13/2013  . 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 Constipation, chronic 05/25/2012  . Hyperglycemia 02/28/2012  . Essential hypertension, benign 02/22/2012  . BPH (benign prostatic hyperplasia) 02/22/2012  . ANEMIA, B12 DEFICIENCY 03/03/2011  . Long term current use of anticoagulant 02/08/2011  . Coronary atherosclerosis of artery bypass graft 08/19/2010  . Cardiac pacemaker in situ 09/02/2009  . Hyperlipidemia with target LDL less than 70 10/26/2007  . Atrial fibrillation (HCC) 10/26/2007  . BRONCHIECTASIS 10/26/2007  . Low back pain without sciatica 10/26/2007    Past Medical History:  Diagnosis Date  . Allergic rhinitis, cause unspecified   . Arthritis   . Atrial fibrillation (HCC)    a. Was on Coumadin until GIB  01/2014.  . Bronchiectasis without acute exacerbation (HCC)   . Coronary artery disease    a. s/p CABG 1993. b. s/p PCI of SVG to OM '03.  . Disorders of diaphragm   . Diverticulosis   . Epistaxis   .  GERD (gastroesophageal reflux disease)   . H/O: GI bleed    a. 01/2014 - presumed diverticular. Received PRBC, Vit K. Coumadin put on hold.  . Hyperlipidemia   . Hypertension   . LV dysfunction    a. Echo 08/2013: EF 45-50%.  . Myocardial infarction   . Plantar fascial fibromatosis   . Spinal stenosis, unspecified region other than cervical   . Tachy-brady syndrome [facility](HCC)    s/p Medtronic PPM '07  . Unspecified sinusitis (chronic)     Family History  Problem Relation Age of Onset  . Stroke Mother   . Diabetes Son   . Cancer Son   . Colon cancer Neg Hx   . Heart disease Neg Hx   . Throat cancer Neg Hx   . Pancreatic cancer Neg Hx   . Kidney disease Neg Hx   . Liver disease Neg Hx   . Osteoporosis Neg Hx    Past Surgical History:  Procedure Laterality Date  . CATARACT EXTRACTION W/ INTRAOCULAR LENS  IMPLANT, BILATERAL    . CHOLECYSTECTOMY    . COLONOSCOPY    . CORONARY ARTERY BYPASS GRAFT     LIMA to LAD, SVG to OM, SVG to left circumflex, SVG to PD/PLSA  . DOPPLER ECHOCARDIOGRAPHY  2011  . HIP ARTHROPLASTY Right 12/10/2013   Procedure: Right Hip Cemented Hemiarthroplasty;  Surgeon: Eldred MangesMark C Yates, MD;  [facility];  Service: Orthopedics;  Laterality: Right;  Right Hip Cemented Hemiarthroplasty  . KIDNEY CYST REMOVAL    . PACEMAKER INSERTION     2007, Medtronic dual-chamber  . PERMANENT PACEMAKER GENERATOR CHANGE N/A 08/14/2014   Procedure: PERMANENT PACEMAKER GENERATOR CHANGE;  Surgeon: Marinus MawGregg W Taylor, MD;  [facility];  Service: Cardiovascular;  Laterality: N/A;  . VERTEBROPLASTY     Social History   Social History Narrative   Married.  Ambulates with a walker.     Objective: Vital Signs: BP 120/68 (BP Location: Left Arm, Patient Position: Sitting, Cuff Size: Large)   Pulse 81   Resp 13   Ht 5\' 11"  (1.803 m)    Physical Exam  Constitutional: He is oriented to person, place, and time. He appears well-developed and well-nourished.  HENT:  Head:  Normocephalic and atraumatic.  Eyes: Conjunctivae and EOM are normal. Pupils are equal, round, and reactive to light.  Neck: Normal range of motion. Neck supple.  Cardiovascular: Normal rate, regular rhythm and normal heart sounds.   Pulmonary/Chest: Effort normal and breath sounds normal.  Abdominal: Soft. Bowel sounds are normal.  Neurological: He is alert and oriented to person, place, and time.  Skin: Skin is warm and dry. Capillary refill takes less than 2 seconds.  Psychiatric: He has a normal mood and affect. His behavior is normal.  Nursing note and vitals reviewed.    Musculoskeletal Exam: Limited range of motion of C-spine, thoracic and lumbar spine. Severe kyphosis of thoracolumbar spine. Limited range of motion of his left shoulder. Elbow joints wrist joints MCPs PIPs with good range of motion. No synovitis was noted. Hip joints knee joints, ankle joints good range of motion with no synovitis.   CDAI Exam: No CDAI exam completed.    Investigation: No additional findings.  Imaging: No results found.  Speciality Comments: No specialty comments available.    Procedures:  No procedures performed Allergies: Asparaginase derivatives; Doxycycline hyclate; Erythromycin; Tetracycline; Ambien [zolpidem tartrate]; Caffeine; Clindamycin; Lactose intolerance (gi); Lyrica [pregabalin]; Metronidazole; Nitrostat [nitroglycerin]; Penicillins; and Sulfonamide derivatives   Assessment / Plan:     Visit Diagnoses: DJD lumbar spine - With the spinal stenosis. He is doing fairly with well without much discomfort.  Postural kyphosis of thoracolumbar region. Some back stretching exercises were discussed.   Primary osteoarthritis of both knees: The pain is tolerable   Primary osteoarthritis of both hands: Joint protection and muscle strengthening discussed.   Age-related osteoporosis without current pathological fracture - Prolia injections through Dr. Barnett Applebaum office now.    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  No orders of the defined types were placed in this encounter.   Face-to-face time spent with patient was 20 minutes. 50% of time was spent in counseling and coordination of care.  Follow-Up Instructions: Return in about 1 year (around 12/28/2017), or if symptoms worsen or fail to improve, for Osteoarthritis, Osteoporosis.   Pollyann Savoy, MD  Note - This record has been created using Animal nutritionist.  Chart creation errors have been sought, but may not always  have been located. Such creation errors do not reflect on  the standard of medical care.

## 2016-12-27 NOTE — Telephone Encounter (Signed)
Rf rq for Silenor. Okay to fill

## 2016-12-27 NOTE — Telephone Encounter (Signed)
Wife called in stating patient needs sleeping meds refill.   Patient uses pharmacy on back of new insurance card [employer] [facility].

## 2016-12-28 ENCOUNTER — Encounter: Payer: Self-pay | Admitting: Rheumatology

## 2016-12-28 ENCOUNTER — Ambulatory Visit (INDEPENDENT_AMBULATORY_CARE_PROVIDER_SITE_OTHER): Payer: Medicare Other | Admitting: Rheumatology

## 2016-12-28 VITALS — BP 120/68 | HR 81 | Resp 13 | Ht 71.0 in

## 2016-12-28 DIAGNOSIS — Z950 Presence of cardiac pacemaker: Secondary | ICD-10-CM | POA: Diagnosis not present

## 2016-12-28 DIAGNOSIS — M170 Bilateral primary osteoarthritis of knee: Secondary | ICD-10-CM

## 2016-12-28 DIAGNOSIS — I25700 Atherosclerosis of coronary artery bypass graft(s), unspecified, with unstable angina pectoris: Secondary | ICD-10-CM

## 2016-12-28 DIAGNOSIS — I482 Chronic atrial fibrillation: Secondary | ICD-10-CM | POA: Diagnosis not present

## 2016-12-28 DIAGNOSIS — M19042 Primary osteoarthritis, left hand: Secondary | ICD-10-CM

## 2016-12-28 DIAGNOSIS — M810 Age-related osteoporosis without current pathological fracture: Secondary | ICD-10-CM

## 2016-12-28 DIAGNOSIS — E785 Hyperlipidemia, unspecified: Secondary | ICD-10-CM | POA: Diagnosis not present

## 2016-12-28 DIAGNOSIS — M4005 Postural kyphosis, thoracolumbar region: Secondary | ICD-10-CM | POA: Diagnosis not present

## 2016-12-28 DIAGNOSIS — Z8781 Personal history of (healed) traumatic fracture: Secondary | ICD-10-CM

## 2016-12-28 DIAGNOSIS — Z9229 Personal history of other drug therapy: Secondary | ICD-10-CM | POA: Diagnosis not present

## 2016-12-28 DIAGNOSIS — I4821 Permanent atrial fibrillation: Secondary | ICD-10-CM

## 2016-12-28 DIAGNOSIS — F5101 Primary insomnia: Secondary | ICD-10-CM | POA: Diagnosis not present

## 2016-12-28 DIAGNOSIS — M19041 Primary osteoarthritis, right hand: Secondary | ICD-10-CM

## 2016-12-28 DIAGNOSIS — M47816 Spondylosis without myelopathy or radiculopathy, lumbar region: Secondary | ICD-10-CM | POA: Diagnosis not present

## 2016-12-28 DIAGNOSIS — I200 Unstable angina: Secondary | ICD-10-CM

## 2017-01-04 ENCOUNTER — Encounter: Payer: Self-pay | Admitting: Internal Medicine

## 2017-01-04 ENCOUNTER — Ambulatory Visit (INDEPENDENT_AMBULATORY_CARE_PROVIDER_SITE_OTHER): Payer: Medicare Other | Admitting: Internal Medicine

## 2017-01-04 ENCOUNTER — Other Ambulatory Visit (INDEPENDENT_AMBULATORY_CARE_PROVIDER_SITE_OTHER): Payer: Medicare Other

## 2017-01-04 ENCOUNTER — Ambulatory Visit: Payer: Medicare Other

## 2017-01-04 VITALS — BP 118/76 | HR 85 | Temp 97.7°F | Resp 16 | Ht 71.0 in | Wt 144.0 lb

## 2017-01-04 DIAGNOSIS — D518 Other vitamin B12 deficiency anemias: Secondary | ICD-10-CM

## 2017-01-04 DIAGNOSIS — I480 Paroxysmal atrial fibrillation: Secondary | ICD-10-CM

## 2017-01-04 DIAGNOSIS — E785 Hyperlipidemia, unspecified: Secondary | ICD-10-CM | POA: Diagnosis not present

## 2017-01-04 DIAGNOSIS — I10 Essential (primary) hypertension: Secondary | ICD-10-CM | POA: Diagnosis not present

## 2017-01-04 DIAGNOSIS — K5909 Other constipation: Secondary | ICD-10-CM | POA: Diagnosis not present

## 2017-01-04 DIAGNOSIS — D508 Other iron deficiency anemias: Secondary | ICD-10-CM

## 2017-01-04 DIAGNOSIS — I25700 Atherosclerosis of coronary artery bypass graft(s), unspecified, with unstable angina pectoris: Secondary | ICD-10-CM | POA: Diagnosis not present

## 2017-01-04 DIAGNOSIS — I2510 Atherosclerotic heart disease of native coronary artery without angina pectoris: Secondary | ICD-10-CM

## 2017-01-04 LAB — CBC WITH DIFFERENTIAL/PLATELET
Basophils Absolute: 0.1 10*3/uL (ref 0.0–0.1)
Basophils Relative: 0.7 % (ref 0.0–3.0)
EOS PCT: 4.6 % (ref 0.0–5.0)
Eosinophils Absolute: 0.5 10*3/uL (ref 0.0–0.7)
HCT: 45.2 % (ref 39.0–52.0)
HEMOGLOBIN: 15.1 g/dL (ref 13.0–17.0)
LYMPHS ABS: 1.7 10*3/uL (ref 0.7–4.0)
Lymphocytes Relative: 17.4 % (ref 12.0–46.0)
MCHC: 33.5 g/dL (ref 30.0–36.0)
MCV: 94.3 fl (ref 78.0–100.0)
MONO ABS: 0.8 10*3/uL (ref 0.1–1.0)
MONOS PCT: 8.1 % (ref 3.0–12.0)
NEUTROS PCT: 69.2 % (ref 43.0–77.0)
Neutro Abs: 6.8 10*3/uL (ref 1.4–7.7)
Platelets: 188 10*3/uL (ref 150.0–400.0)
RBC: 4.79 Mil/uL (ref 4.22–5.81)
RDW: 14.3 % (ref 11.5–15.5)
WBC: 9.9 10*3/uL (ref 4.0–10.5)

## 2017-01-04 LAB — COMPREHENSIVE METABOLIC PANEL
ALT: 10 U/L (ref 0–53)
AST: 20 U/L (ref 0–37)
Albumin: 3.9 g/dL (ref 3.5–5.2)
Alkaline Phosphatase: 70 U/L (ref 39–117)
BUN: 18 mg/dL (ref 6–23)
CO2: 25 mEq/L (ref 19–32)
Calcium: 9.4 mg/dL (ref 8.4–10.5)
Chloride: 107 mEq/L (ref 96–112)
Creatinine, Ser: 0.79 mg/dL (ref 0.40–1.50)
GFR: 99.62 mL/min (ref >60.00)
GLUCOSE: 89 mg/dL (ref 70–99)
POTASSIUM: 5.4 meq/L — AB (ref 3.5–5.1)
SODIUM: 139 meq/L (ref 135–145)
Total Bilirubin: 0.4 mg/dL (ref 0.2–1.2)
Total Protein: 7.2 g/dL (ref 6.0–8.3)

## 2017-01-04 LAB — MAGNESIUM: MAGNESIUM: 2.1 mg/dL (ref 1.5–2.5)

## 2017-01-04 MED ORDER — CYANOCOBALAMIN 1000 MCG/ML IJ SOLN
1000.0000 ug | Freq: Once | INTRAMUSCULAR | Status: AC
  Administered 2017-01-04: 1000 ug via INTRAMUSCULAR

## 2017-01-04 NOTE — Progress Notes (Signed)
Subjective:  Patient ID: Dwayne [name], male    DOB: [birth year]  Age: 83 y.o. MRN: [id number]  CC: Hypertension; Hyperlipidemia; and Coronary Artery Disease   HPI Dwayne [name] presents for f/up on the above medical concerns, he complains of occasional constipation but gets sx relief with miralax. He offers no other sx's today.  Outpatient Medications Prior to Visit  Medication Sig Dispense Refill  . atorvastatin (LIPITOR) 40 MG tablet TAKE 1 TABLET BY MOUTH  DAILY 90 tablet 3  . Calcium-Magnesium-Vitamin D (CITRACAL CALCIUM+D) 600-40-500 MG-MG-UNIT TB24 Take 2 tablets by mouth daily.     . cyanocobalamin (,VITAMIN B-12,) 1000 MCG/ML injection Inject 1,000 mcg into the muscle every 21 ( twenty-one) days.    . Doxepin HCl (SILENOR) 6 MG TABS Take 1 tablet (6 mg total) by mouth at bedtime as needed. 90 tablet 3  . ELIQUIS 5 MG TABS tablet TAKE 1 TABLET BY MOUTH TWO  TIMES DAILY 180 tablet 3  . fexofenadine (ALLEGRA) 180 MG tablet Take 180 mg by mouth at bedtime as needed (for allergies).     . finasteride (PROSCAR) 5 MG tablet TAKE 1 TABLET BY MOUTH AT  BEDTIME 90 tablet 3  . metoprolol tartrate (LOPRESSOR) 25 MG tablet TAKE 1 TABLET BY MOUTH  TWICE A DAY 180 tablet 0  . omeprazole (PRILOSEC) 40 MG capsule Take 1 capsule (40 mg total) by mouth daily. 90 capsule 1  . polyethylene glycol (MIRALAX / GLYCOLAX) packet Take 17 g by mouth daily as needed for moderate constipation.     [name] Kitchen PROLIA 60 MG/ML SOLN injection Inject 60 mg into the skin every 6 (six) months.     . terazosin (HYTRIN) 5 MG capsule TAKE 1 CAPSULE BY MOUTH  DAILY 90 capsule 3  . Biotin 1000 MCG tablet Take 1,000 mcg by mouth daily.    . fentaNYL (DURAGESIC - DOSED MCG/HR) 25 MCG/HR patch Place 1 patch (25 mcg total) onto the skin every 3 (three) days. 10 patch 0  . FLUAD 0.5 ML SUSY inject 0.5 milliliter intramuscularly  0  . NAMZARIC 7 & 14 & 21 &28 -10 MG C4PK Take 1 tablet by mouth daily. 30 each 0  . nitroGLYCERIN  (NITROSTAT) 0.4 MG SL tablet Place 1 tablet (0.4 mg total) under the tongue every 5 (five) minutes x 3 doses as needed for chest pain. (Patient not taking: Reported on 01/04/2017) 25 tablet 1   No facility-administered medications prior to visit.     ROS Review of Systems  Constitutional: Negative.  Negative for diaphoresis, fatigue and unexpected weight change.  HENT: Negative.   Eyes: Negative.   Respiratory: Negative.  Negative for cough, chest tightness, shortness of breath and wheezing.   Cardiovascular: Negative for chest pain, palpitations and leg swelling.  Gastrointestinal: Positive for constipation. Negative for abdominal pain, nausea and vomiting.  Endocrine: Negative.  Negative for cold intolerance and heat intolerance.  Genitourinary: Negative.  Negative for difficulty urinating, dysuria, frequency and urgency.  Musculoskeletal: Negative for back pain and neck pain.  Skin: Negative.  Negative for color change and rash.  Allergic/Immunologic: Negative.   Neurological: Negative.   Hematological: Negative.  Negative for adenopathy. Does not bruise/bleed easily.  Psychiatric/Behavioral: Negative.     Objective:  BP 118/76 (BP Location: Left Arm, Patient Position: Sitting, Cuff Size: Normal)   Pulse 85   Temp 97.7 F (36.5 C)   Resp 16   Ht 5\' 11"  (1.803 m)   Wt 144 lb (65.3  kg)   SpO2 96%   BMI 20.08 kg/m   BP Readings from Last 3 Encounters:  01/04/17 118/76  12/28/16 120/68  12/21/16 126/60    Wt Readings from Last 3 Encounters:  01/04/17 144 lb (65.3 kg)  12/21/16 144 lb 1.3 oz (65.4 kg)  08/26/16 165 lb (74.8 kg)    Physical Exam  Constitutional: He is oriented to person, place, and time. No distress.  HENT:  Mouth/Throat: Oropharynx is clear and moist. No oropharyngeal exudate.  Eyes: Conjunctivae are normal. Right eye exhibits no discharge. Left eye exhibits no discharge. No scleral icterus.  Neck: Normal range of motion. Neck supple. No JVD present.  No tracheal deviation present. No thyromegaly present.  Cardiovascular: Normal rate, normal heart sounds and intact distal pulses.  An irregularly irregular rhythm present. Exam reveals no gallop and no friction rub.   No murmur heard. Pulmonary/Chest: Effort normal and breath sounds normal. No stridor. No respiratory distress. He has no wheezes. He has no rales. He exhibits no tenderness.  Abdominal: Soft. Bowel sounds are normal. He exhibits no distension and no mass. There is no tenderness. There is no rebound and no guarding.  Musculoskeletal: Normal range of motion. He exhibits no edema, tenderness or deformity.  Lymphadenopathy:    He has no cervical adenopathy.  Neurological: He is oriented to person, place, and time.  Skin: Skin is warm and dry. No rash noted. He is not diaphoretic. No erythema. No pallor.  Vitals reviewed.   Lab Results  Component Value Date   WBC 9.9 01/04/2017   HGB 15.1 01/04/2017   HCT 45.2 01/04/2017   PLT 188.0 01/04/2017   GLUCOSE 89 01/04/2017   CHOL 138 02/02/2016   TRIG 70 02/02/2016   HDL 44 02/02/2016   LDLCALC 80 02/02/2016   ALT 10 01/04/2017   AST 20 01/04/2017   NA 139 01/04/2017   K 5.4 (H) 01/04/2017   CL 107 01/04/2017   CREATININE 0.79 01/04/2017   BUN 18 01/04/2017   CO2 25 01/04/2017   TSH 1.22 01/04/2017   PSA 3.29 02/07/2015   INR 1.4 (H) 02/21/2014   HGBA1C 5.9 03/11/2015    Ct Lumbar Spine Wo Contrast  Result Date: 08/08/2016 CLINICAL DATA:  Chronic back pain. History of vertebroplasty and dementia. No reported recent injury. EXAM: CT LUMBAR SPINE WITHOUT CONTRAST TECHNIQUE: Multidetector CT imaging of the lumbar spine was performed without intravenous contrast administration. Multiplanar CT image reconstructions were also generated. COMPARISON:  Radiographs 08/03/2016.  CT 07/30/2016. FINDINGS: Segmentation: Normal. Alignment: Stable grade 1 anterolisthesis at L4-5 secondary to facet disease. Vertebrae: Again demonstrated is  a subacute L1 burst fracture, not significantly changed in appearance from recent study. There is greater than 50% loss of vertebral body height centrally and mild osseous retropulsion. Remote L4 fracture also appears stable status post spinal augmentation. This fracture is also associated with significant loss of vertebral body height and osseous retropulsion. No new fractures are seen. There are possible sequela old insufficiency fractures in the sacrum, grossly stable. Paraspinal and other soft tissues: Stable paraspinal hemorrhage at L1. Extensive aortoiliac atherosclerosis. Sigmoid colon diverticular changes are noted. Disc levels: L1-2: Stable mild multifactorial spinal stenosis secondary to annular disc bulging, facet and ligamentous hypertrophy. L2-3: Severe multifactorial spinal stenosis secondary to annular disc bulging, facet and ligamentous hypertrophy. L3-4: Severe multifactorial spinal stenosis secondary to annular disc bulging, facet and ligamentous hypertrophy. Moderate foraminal narrowing, worse on the right. L4-5: Severe multifactorial spinal stenosis secondary to annular disc  bulging, facet and ligamentous hypertrophy. Severe foraminal narrowing bilaterally with probable bilateral L4 nerve root encroachment, similar to previous study. L5-S1: Stable partially calcified central and left foraminal disc protrusion with moderate facet and ligamentous hypertrophy. Persistent severe left foraminal narrowing. IMPRESSION: 1. Compared with the prior CT from 3 weeks ago, no significant changes are identified. 2. The subacute burst fracture at L1 has not significantly changed. There is associated osseous retropulsion and greater than 50% loss of vertebral body height. 3. Stable chronic L4 fracture status post spinal augmentation. No new fractures. 4. Severe multilevel spondylosis contributing to severe multifactorial spinal stenosis from L2-3 through L4-5. Similar significant foraminal narrowing, especially  at L4-5. Electronically Signed   By: Carey Bullocks M.D.   On: 08/08/2016 20:51    Assessment & Plan:   Dwayne [name] was seen today for hypertension, hyperlipidemia and coronary artery disease.  Diagnoses and all orders for this visit:  Paroxysmal atrial fibrillation (HCC)- he has good rate control, will continue anticoagulation with Eliquis -     Thyroid Panel With TSH; Future  Essential hypertension, benign- his blood pressures adequately well-controlled, electrolytes and renal function are stable. -     Comprehensive metabolic panel; Future -     Thyroid Panel With TSH; Future -     Magnesium; Future  Coronary artery disease involving native coronary artery of native heart without angina pectoris- he said no recent episodes of angina, will continue risk factor modification with statin therapy and blood pressure control. -     Lipid panel; Future  Constipation, chronic- his labs are negative for any secondary metabolic causes, will continue using MiraLAX as needed. -     Thyroid Panel With TSH; Future -     Magnesium; Future  ANEMIA, B12 DEFICIENCY- improvement noted, will continue B12 replacement therapy. -     CBC with Differential/Platelet; Future -     cyanocobalamin ((VITAMIN B-12)) injection 1,000 mcg; Inject 1 mL (1,000 mcg total) into the muscle once.  Other iron deficiency anemia- improvement noted -     CBC with Differential/Platelet; Future  Hyperlipidemia with target LDL less than 70- he has achieved his LDL goal is doing well on the statin. -     Lipid panel; Future -     Thyroid Panel With TSH; Future   I have discontinued Mr. Pfarr Biotin, fentaNYL, NAMZARIC, and FLUAD. I am also having him maintain his fexofenadine, Calcium-Magnesium-Vitamin D, polyethylene glycol, PROLIA, cyanocobalamin, nitroGLYCERIN, terazosin, ELIQUIS, omeprazole, metoprolol tartrate, finasteride, atorvastatin, and Doxepin HCl. We administered cyanocobalamin.  Meds ordered this encounter    Medications  . cyanocobalamin ((VITAMIN B-12)) injection 1,000 mcg     Follow-up: Return in about 6 months (around 07/04/2017).  Sanda Linger, MD

## 2017-01-04 NOTE — Progress Notes (Signed)
Pre visit review using our clinic review tool, if applicable. No additional management support is needed unless otherwise documented below in the visit note. 

## 2017-01-04 NOTE — Patient Instructions (Signed)
Hypertension Hypertension, commonly called high blood pressure, is when the force of blood pumping through your arteries is too strong. Your arteries are the blood vessels that carry blood from your heart throughout your body. A blood pressure reading consists of a higher number over a lower number, such as 110/72. The higher number (systolic) is the pressure inside your arteries when your heart pumps. The lower number (diastolic) is the pressure inside your arteries when your heart relaxes. Ideally you want your blood pressure below 120/80. Hypertension forces your heart to work harder to pump blood. Your arteries may become narrow or stiff. Having untreated or uncontrolled hypertension can cause heart attack, stroke, kidney disease, and other problems. What increases the risk? Some risk factors for high blood pressure are controllable. Others are not. Risk factors you cannot control include:  Race. You may be at higher risk if you are African American.  Age. Risk increases with age.  Gender. Men are at higher risk than women before age 45 years. After age 65, women are at higher risk than men. Risk factors you can control include:  Not getting enough exercise or physical activity.  Being overweight.  Getting too much fat, sugar, calories, or salt in your diet.  Drinking too much alcohol. What are the signs or symptoms? Hypertension does not usually cause signs or symptoms. Extremely high blood pressure (hypertensive crisis) may cause headache, anxiety, shortness of breath, and nosebleed. How is this diagnosed? To check if you have hypertension, your health care provider will measure your blood pressure while you are seated, with your arm held at the level of your heart. It should be measured at least twice using the same arm. Certain conditions can cause a difference in blood pressure between your right and left arms. A blood pressure reading that is higher than normal on one occasion does  not mean that you need treatment. If it is not clear whether you have high blood pressure, you may be asked to return on a different day to have your blood pressure checked again. Or, you may be asked to monitor your blood pressure at home for 1 or more weeks. How is this treated? Treating high blood pressure includes making lifestyle changes and possibly taking medicine. Living a healthy lifestyle can help lower high blood pressure. You may need to change some of your habits. Lifestyle changes may include:  Following the DASH diet. This diet is high in fruits, vegetables, and whole grains. It is low in salt, red meat, and added sugars.  Keep your sodium intake below 2,300 mg per day.  Getting at least 30-45 minutes of aerobic exercise at least 4 times per week.  Losing weight if necessary.  Not smoking.  Limiting alcoholic beverages.  Learning ways to reduce stress. Your health care provider may prescribe medicine if lifestyle changes are not enough to get your blood pressure under control, and if one of the following is true:  You are 18-59 years of age and your systolic blood pressure is above 140.  You are 60 years of age or older, and your systolic blood pressure is above 150.  Your diastolic blood pressure is above 90.  You have diabetes, and your systolic blood pressure is over 140 or your diastolic blood pressure is over 90.  You have kidney disease and your blood pressure is above 140/90.  You have heart disease and your blood pressure is above 140/90. Your personal target blood pressure may vary depending on your medical   conditions, your age, and other factors. Follow these instructions at home:  Have your blood pressure rechecked as directed by your health care provider.  Take medicines only as directed by your health care provider. Follow the directions carefully. Blood pressure medicines must be taken as prescribed. The medicine does not work as well when you skip  doses. Skipping doses also puts you at risk for problems.  Do not smoke.  Monitor your blood pressure at home as directed by your health care provider. Contact a health care provider if:  You think you are having a reaction to medicines taken.  You have recurrent headaches or feel dizzy.  You have swelling in your ankles.  You have trouble with your vision. Get help right away if:  You develop a severe headache or confusion.  You have unusual weakness, numbness, or feel faint.  You have severe chest or abdominal pain.  You vomit repeatedly.  You have trouble breathing. This information is not intended to replace advice given to you by your health care provider. Make sure you discuss any questions you have with your health care provider. Document Released: 12/06/2005 Document Revised: 05/13/2016 Document Reviewed: 09/28/2013 Elsevier Interactive Patient Education  2017 [employer]  

## 2017-01-05 LAB — THYROID PANEL WITH TSH
Free Thyroxine Index: 2.9 (ref 1.4–3.8)
T3 Uptake: 33 % (ref 22–35)
T4 TOTAL: 8.8 ug/dL (ref 4.5–12.0)
TSH: 1.22 m[IU]/L (ref 0.40–4.50)

## 2017-01-10 ENCOUNTER — Other Ambulatory Visit: Payer: Self-pay | Admitting: Internal Medicine

## 2017-01-10 ENCOUNTER — Telehealth: Payer: Self-pay | Admitting: Internal Medicine

## 2017-01-10 DIAGNOSIS — K219 Gastro-esophageal reflux disease without esophagitis: Secondary | ICD-10-CM | POA: Insufficient documentation

## 2017-01-10 MED ORDER — OMEPRAZOLE 40 MG PO CPDR: 40.0000 mg | DELAYED_RELEASE_CAPSULE | Freq: Every day | ORAL | 1 refills | Status: DC

## 2017-01-10 NOTE — Telephone Encounter (Signed)
Pt needs refill on his omeprazole (PRILOSEC) 40 MG capsule [[id number]][[id number]]

## 2017-01-10 NOTE — Telephone Encounter (Signed)
done

## 2017-01-11 MED ORDER — OMEPRAZOLE 40 MG PO CPDR: 40.0000 mg | DELAYED_RELEASE_CAPSULE | Freq: Every day | ORAL | 1 refills | Status: DC

## 2017-01-11 NOTE — Telephone Encounter (Signed)
Rec'd call from pt wife wanting to know if omeprazole was sent to [facility]. Per [facility]. Requesting rx to be sent to local pharmacy, and also pt has been taking Namzaric 10 mg but she states med is not working for him requesting alternative.[name] Kitchen.Resent omeprazole pls advise on Namzaric .Raechel Chute/lmb

## 2017-01-11 NOTE — Telephone Encounter (Signed)
There is no alternative for Namzaric  If it is not helping him then it just means that he has an age-related dementia that will not get better with medication

## 2017-01-12 NOTE — Telephone Encounter (Signed)
Notified pt/wife w/MD response../lmb 

## 2017-01-14 ENCOUNTER — Telehealth: Payer: Self-pay | Admitting: Internal Medicine

## 2017-01-14 ENCOUNTER — Telehealth: Payer: Self-pay | Admitting: Cardiology

## 2017-01-14 MED ORDER — METOPROLOL TARTRATE 25 MG PO TABS: 25.0000 mg | ORAL_TABLET | Freq: Two times a day (BID) | ORAL | 0 refills | Status: DC

## 2017-01-14 NOTE — Telephone Encounter (Signed)
rx sent to requesting pharmacy.

## 2017-01-14 NOTE — Telephone Encounter (Signed)
New Message   *STAT* If patient is at the pharmacy, call can be transferred to refill team.   1. Which medications need to be refilled? (please list name of each medication and dose if known)  metoprolol tartrate (Lopressor) 25 mg tablet twice daily  2. Which pharmacy/location (including street and city if local pharmacy) is medication to be sent to? [facility] with contact info below: [phone]-phone [phone]  3. Do they need a 30 day or 90 day supply?  90 day supply

## 2017-01-14 NOTE — Telephone Encounter (Signed)
Pt called request for our office to resend rx for Terazosin and Finasteride to [facility]. Pt change insurance now. Please advise.

## 2017-01-15 ENCOUNTER — Other Ambulatory Visit: Payer: Self-pay | Admitting: Internal Medicine

## 2017-01-15 DIAGNOSIS — N401 Enlarged prostate with lower urinary tract symptoms: Principal | ICD-10-CM

## 2017-01-15 DIAGNOSIS — N138 Other obstructive and reflux uropathy: Secondary | ICD-10-CM

## 2017-01-15 MED ORDER — TERAZOSIN HCL 5 MG PO CAPS: 5.0000 mg | ORAL_CAPSULE | Freq: Every day | ORAL | 3 refills | Status: AC

## 2017-01-15 MED ORDER — FINASTERIDE 5 MG PO TABS: 5.0000 mg | ORAL_TABLET | Freq: Every day | ORAL | 3 refills | Status: AC

## 2017-02-01 ENCOUNTER — Other Ambulatory Visit: Payer: Self-pay | Admitting: Internal Medicine

## 2017-02-01 ENCOUNTER — Telehealth: Payer: Self-pay | Admitting: Internal Medicine

## 2017-02-01 NOTE — Telephone Encounter (Signed)
namzaric was stopped due to a lack of efficacy

## 2017-02-01 NOTE — Telephone Encounter (Signed)
States patient is out of medication for dementia.  Does not know name of medication.  Patient uses [facility] delivery.  Please follow up in regard.

## 2017-02-02 ENCOUNTER — Telehealth: Payer: Self-pay | Admitting: Internal Medicine

## 2017-02-02 ENCOUNTER — Ambulatory Visit (INDEPENDENT_AMBULATORY_CARE_PROVIDER_SITE_OTHER): Payer: Medicare Other

## 2017-02-02 DIAGNOSIS — E538 Deficiency of other specified B group vitamins: Secondary | ICD-10-CM

## 2017-02-02 MED ORDER — CYANOCOBALAMIN 1000 MCG/ML IJ SOLN
1000.0000 ug | Freq: Once | INTRAMUSCULAR | Status: AC
  Administered 2017-02-02: 1000 ug via INTRAMUSCULAR

## 2017-02-02 NOTE — Telephone Encounter (Signed)
yes

## 2017-02-02 NOTE — Telephone Encounter (Signed)
Left vm for pt to call back, need to see if he can come 2-3 days before his nest appt for B12 inj to get the lab drawn (Dr. Yetta BarreJones wants to check his B12 level before next injection).

## 2017-02-02 NOTE — Telephone Encounter (Signed)
Pt has an appt to get B12 inj today, can not find last B12 lab result. Do you want the pt to have B12 lab right before his next inj? Please advise. I will let the pt know when he comes in today.

## 2017-02-02 NOTE — Telephone Encounter (Signed)
Spoke to spouse and she is requesting another medication that would help with the dementia. Please advise

## 2017-02-03 NOTE — Telephone Encounter (Signed)
Please advise 

## 2017-02-03 NOTE — Telephone Encounter (Signed)
Dwayne [name] inform pt.

## 2017-02-16 ENCOUNTER — Other Ambulatory Visit: Payer: Self-pay

## 2017-02-16 MED ORDER — APIXABAN 5 MG PO TABS: 5.0000 mg | ORAL_TABLET | Freq: Two times a day (BID) | ORAL | 3 refills | Status: AC

## 2017-02-18 NOTE — Progress Notes (Signed)
HPI: FU CAD s/p CABG, atrial fibrillation, HTN. Per notes, he had CABG 1993 with LIMA- LAD, SVG to OM, SVG to left circumflex, SVG to PD/PLSA. He had PCI of the saphenous vein graft to his obtuse marginal in 2003. Abdominal CT in June 2011 showed no abdominal aortic aneurysm. He was hospitalized February 2015 for GIB with Hgb down to 7.6 (was 11-12 in December). He was treated with Vit K and PRBCs. Coumadin was held. Last echocardiogram April 2015 showed an ejection fraction of 50-55%, mild mitral regurgitation and trace aortic insufficiency. Now on apixaban. Since he was last seen, patient denies dyspnea, chest pain, palpitations or syncope. No bleeding.  Current Outpatient Prescriptions  Medication Sig Dispense Refill  . apixaban (ELIQUIS) 5 MG TABS tablet Take 1 tablet (5 mg total) by mouth 2 (two) times daily. 180 tablet 3  . atorvastatin (LIPITOR) 40 MG tablet TAKE 1 TABLET BY MOUTH  DAILY 90 tablet 3  . Calcium-Magnesium-Vitamin D (CITRACAL CALCIUM+D) 600-40-500 MG-MG-UNIT TB24 Take 2 tablets by mouth daily.     . cyanocobalamin (,VITAMIN B-12,) 1000 MCG/ML injection Inject 1,000 mcg into the muscle every 21 ( twenty-one) days.    . Doxepin HCl (SILENOR) 6 MG TABS Take 1 tablet (6 mg total) by mouth at bedtime as needed. 90 tablet 3  . fexofenadine (ALLEGRA) 180 MG tablet Take 180 mg by mouth at bedtime as needed (for allergies).     . finasteride (PROSCAR) 5 MG tablet Take 1 tablet (5 mg total) by mouth at bedtime. 90 tablet 3  . metoprolol tartrate (LOPRESSOR) 25 MG tablet Take 1 tablet (25 mg total) by mouth 2 (two) times daily. 180 tablet 0  . nitroGLYCERIN (NITROSTAT) 0.4 MG SL tablet Place 1 tablet (0.4 mg total) under the tongue every 5 (five) minutes x 3 doses as needed for chest pain. 25 tablet 1  . omeprazole (PRILOSEC) 40 MG capsule Take 1 capsule (40 mg total) by mouth daily. 90 capsule 1  . polyethylene glycol (MIRALAX / GLYCOLAX) packet Take 17 g by mouth daily as needed for  moderate constipation.     [name] Kitchen PROLIA 60 MG/ML SOLN injection Inject 60 mg into the skin every 6 (six) months.     . terazosin (HYTRIN) 5 MG capsule Take 1 capsule (5 mg total) by mouth daily. 90 capsule 3   No current facility-administered medications for this visit.     Allergies  Allergen Reactions  . Asparaginase Derivatives Other (See Comments)    Makes jittery & causes vision problems, headaches  . Doxycycline Hyclate Diarrhea and Nausea And Vomiting  . Erythromycin Diarrhea and Nausea And Vomiting  . Tetracycline Diarrhea and Nausea And Vomiting  . Ambien [Zolpidem Tartrate] Other (See Comments)    unknown  . Caffeine Other (See Comments)    Interrupts sleep  . Clindamycin Other (See Comments)    REACTION: upset stomach  . Lactose Intolerance (Gi) Other (See Comments)    unknown  . Lyrica [Pregabalin] Other (See Comments)    unknown  . Metronidazole Itching and Rash  . Nitrostat [Nitroglycerin] Diarrhea  . Penicillins Hives    Has patient had a PCN reaction causing immediate rash, facial/tongue/throat swelling, SOB or lightheadedness with hypotension: Yes Has patient had a PCN reaction causing severe rash involving mucus membranes or skin necrosis: No Has patient had a PCN reaction that required hospitalization No Has patient had a PCN reaction occurring within the last 10 years: No If all of the above  answers are "NO", then may proceed with Cephalosporin use.   . Sulfonamide Derivatives Hives     Past Medical History:  Diagnosis Date  . Allergic rhinitis, cause unspecified   . Arthritis   . Atrial fibrillation (HCC)    a. Was on Coumadin until GIB 01/2014.  . Bronchiectasis without acute exacerbation (HCC)   . Coronary artery disease    a. s/p CABG 1993. b. s/p PCI of SVG to OM '03.  . Disorders of diaphragm   . Diverticulosis   . Epistaxis   . GERD (gastroesophageal reflux disease)   . H/O: GI bleed    a. 01/2014 - presumed diverticular. Received PRBC, Vit K.  Coumadin put on hold.  . Hyperlipidemia   . Hypertension   . LV dysfunction    a. Echo 08/2013: EF 45-50%.  . Myocardial infarction   . Plantar fascial fibromatosis   . Spinal stenosis, unspecified region other than cervical   . Tachy-brady syndrome [facility])    s/p Medtronic PPM '07  . Unspecified sinusitis (chronic)     Past Surgical History:  Procedure Laterality Date  . CATARACT EXTRACTION W/ INTRAOCULAR LENS  IMPLANT, BILATERAL    . CHOLECYSTECTOMY    . COLONOSCOPY    . CORONARY ARTERY BYPASS GRAFT     LIMA to LAD, SVG to OM, SVG to left circumflex, SVG to PD/PLSA  . DOPPLER ECHOCARDIOGRAPHY  2011  . HIP ARTHROPLASTY Right 12/10/2013   Procedure: Right Hip Cemented Hemiarthroplasty;  Surgeon: Eldred Manges, MD;  Location: [facility] OR;  Service: Orthopedics;  Laterality: Right;  Right Hip Cemented Hemiarthroplasty  . KIDNEY CYST REMOVAL    . PACEMAKER INSERTION     2007, Medtronic dual-chamber  . PERMANENT PACEMAKER GENERATOR CHANGE N/A 08/14/2014   Procedure: PERMANENT PACEMAKER GENERATOR CHANGE;  Surgeon: Marinus Maw, MD;  Location: [facility] CATH LAB;  Service: Cardiovascular;  Laterality: N/A;  . VERTEBROPLASTY      Social History   Social History  . Marital status: Married    Spouse name: Ollen Gross  . Number of children: 4  . Years of education: N/A   Occupational History  .  Retired   Social History Main Topics  . Smoking status: Former Games developer  . Smokeless tobacco: Never Used     Comment: x 6 months in high school  . Alcohol use No     Comment: none  . Drug use: No  . Sexual activity: No   Other Topics Concern  . Not on file   Social History Narrative   Married.  Ambulates with a walker.    Family History  Problem Relation Age of Onset  . Stroke Mother   . Diabetes Son   . Cancer Son   . Colon cancer Neg Hx   . Heart disease Neg Hx   . Throat cancer Neg Hx   . Pancreatic cancer Neg Hx   . Kidney disease Neg Hx   . Liver disease Neg Hx   . Osteoporosis Neg  Hx     ROS: no fevers or chills, productive cough, hemoptysis, dysphasia, odynophagia, melena, hematochezia, dysuria, hematuria, rash, seizure activity, orthopnea, PND, pedal edema, claudication. Remaining systems are negative.  Physical Exam:   Blood pressure 96/60, pulse 79, height 5\' 11"  (1.803 m), weight 148 lb 6.4 oz (67.3 kg).  General:  Well developed/frail in NAD Skin warm/dry Patient not depressed No peripheral clubbing Back-normal HEENT-normal/normal eyelids Neck supple/normal carotid upstroke bilaterally; no bruits; no JVD; no thyromegaly chest -  CTA/ normal expansion CV - RRR/normal S1 and S2; no murmurs, rubs or gallops;  PMI nondisplaced Abdomen -NT/ND, no HSM, no mass, + bowel sounds, no bruit 2+ femoral pulses, no bruits Ext-no edema, chords, 2+ DP Neuro-grossly nonfocal  ECG -ventricular pacing with underlying atrial fibrillation; personally reviewed  A/P  1 permanent atrial fibrillation-continue apixaban and beta blocker.  2 prior pacemaker placement-management per electrophysiology.  3 coronary artery disease-statin was previously discontinued by primary care because of worsening dementia. No aspirin given need for anticoagulation.  4 hyperlipidemia-statin discontinued because of worsening dementia. Continue diet.  5 hypertension-blood pressure controlled. Continue present medications.  Olga MillersBrian Crenshaw, MD

## 2017-02-22 ENCOUNTER — Ambulatory Visit (INDEPENDENT_AMBULATORY_CARE_PROVIDER_SITE_OTHER): Payer: Medicare Other | Admitting: Cardiology

## 2017-02-22 ENCOUNTER — Other Ambulatory Visit (INDEPENDENT_AMBULATORY_CARE_PROVIDER_SITE_OTHER): Payer: Medicare Other

## 2017-02-22 ENCOUNTER — Encounter: Payer: Self-pay | Admitting: Cardiology

## 2017-02-22 VITALS — BP 96/60 | HR 79 | Ht 71.0 in | Wt 148.4 lb

## 2017-02-22 DIAGNOSIS — I2510 Atherosclerotic heart disease of native coronary artery without angina pectoris: Secondary | ICD-10-CM

## 2017-02-22 DIAGNOSIS — I25810 Atherosclerosis of coronary artery bypass graft(s) without angina pectoris: Secondary | ICD-10-CM | POA: Diagnosis not present

## 2017-02-22 DIAGNOSIS — I4821 Permanent atrial fibrillation: Secondary | ICD-10-CM

## 2017-02-22 DIAGNOSIS — E785 Hyperlipidemia, unspecified: Secondary | ICD-10-CM | POA: Diagnosis not present

## 2017-02-22 DIAGNOSIS — I482 Chronic atrial fibrillation: Secondary | ICD-10-CM

## 2017-02-22 DIAGNOSIS — E538 Deficiency of other specified B group vitamins: Secondary | ICD-10-CM | POA: Diagnosis not present

## 2017-02-22 LAB — LIPID PANEL
CHOL/HDL RATIO: 4
Cholesterol: 178 mg/dL (ref 0–200)
HDL: 44 mg/dL (ref >39.00)
LDL Cholesterol: 104 mg/dL — ABNORMAL HIGH (ref 0–99)
NonHDL: 133.62
TRIGLYCERIDES: 149 mg/dL (ref 0.0–149.0)
VLDL: 29.8 mg/dL (ref 0.0–40.0)

## 2017-02-22 LAB — VITAMIN B12: Vitamin B-12: 448 pg/mL (ref 211–911)

## 2017-02-22 NOTE — Patient Instructions (Signed)
Your physician wants you to follow-up in: 6 MONTHS WITH DR CRENSHAW You will receive a reminder letter in the mail two months in advance. If you don't receive a letter, please call our office to schedule the follow-up appointment.   If you need a refill on your cardiac medications before your next appointment, please call your pharmacy.  

## 2017-02-25 ENCOUNTER — Ambulatory Visit (INDEPENDENT_AMBULATORY_CARE_PROVIDER_SITE_OTHER): Payer: Medicare Other

## 2017-02-25 DIAGNOSIS — E538 Deficiency of other specified B group vitamins: Secondary | ICD-10-CM | POA: Diagnosis not present

## 2017-02-25 MED ORDER — CYANOCOBALAMIN 1000 MCG/ML IJ SOLN
1000.0000 ug | Freq: Once | INTRAMUSCULAR | Status: AC
  Administered 2017-02-25: 1000 ug via INTRAMUSCULAR

## 2017-02-25 NOTE — Progress Notes (Signed)
Injection given.   Dwayne Zingale [name], MD  

## 2017-03-01 ENCOUNTER — Telehealth: Payer: Self-pay | Admitting: *Deleted

## 2017-03-01 DIAGNOSIS — F5101 Primary insomnia: Secondary | ICD-10-CM

## 2017-03-01 MED ORDER — DOXEPIN HCL 6 MG PO TABS: 1.0000 | ORAL_TABLET | Freq: Every evening | ORAL | 1 refills | Status: DC | PRN

## 2017-03-01 NOTE — Telephone Encounter (Signed)
Wife left msg on triage pt is needing refill on his silenor sent [employer].Sent electronically...Raechel Chute/lmb

## 2017-03-02 ENCOUNTER — Other Ambulatory Visit: Payer: Self-pay | Admitting: Internal Medicine

## 2017-03-02 DIAGNOSIS — F5101 Primary insomnia: Secondary | ICD-10-CM

## 2017-03-02 MED ORDER — DOXEPIN HCL 6 MG PO TABS: 1.0000 | ORAL_TABLET | Freq: Every evening | ORAL | 1 refills | Status: DC | PRN

## 2017-03-04 ENCOUNTER — Telehealth: Payer: Self-pay | Admitting: Internal Medicine

## 2017-03-04 NOTE — Telephone Encounter (Signed)
Wife would like to know if a script can be sent to [facility] b/c patient is currently out of meds.

## 2017-03-07 NOTE — Telephone Encounter (Signed)
Called pt/wife to confirm msg below. Pt states he doesn't need was notified by [employer] mail service he should have the Silenor by [employer]...Raechel Chute/lmb

## 2017-03-11 ENCOUNTER — Ambulatory Visit: Payer: Medicare Other | Admitting: Nurse Practitioner

## 2017-03-12 ENCOUNTER — Ambulatory Visit (INDEPENDENT_AMBULATORY_CARE_PROVIDER_SITE_OTHER): Payer: Medicare Other | Admitting: Family Medicine

## 2017-03-12 ENCOUNTER — Ambulatory Visit: Payer: Medicare Other | Admitting: Family Medicine

## 2017-03-12 VITALS — BP 128/84 | HR 84 | Temp 98.1°F | Resp 16

## 2017-03-12 DIAGNOSIS — I25810 Atherosclerosis of coronary artery bypass graft(s) without angina pectoris: Secondary | ICD-10-CM | POA: Diagnosis not present

## 2017-03-12 DIAGNOSIS — R05 Cough: Secondary | ICD-10-CM

## 2017-03-12 DIAGNOSIS — R062 Wheezing: Secondary | ICD-10-CM | POA: Diagnosis not present

## 2017-03-12 DIAGNOSIS — R059 Cough, unspecified: Secondary | ICD-10-CM

## 2017-03-12 MED ORDER — LEVOFLOXACIN 500 MG PO TABS: 500.0000 mg | ORAL_TABLET | Freq: Every day | ORAL | 0 refills | Status: DC

## 2017-03-12 MED ORDER — PREDNISONE 10 MG PO TABS: ORAL_TABLET | ORAL | 0 refills | Status: DC

## 2017-03-12 NOTE — Progress Notes (Signed)
Subjective:     Patient ID: Dwayne [name], male   DOB: [birth year], 83 y.o.   MRN: [id number]  HPI Patient has multiple chronic problems including history of dementia, CAD, atrial fibrillation, hypertension who is seen in Saturday clinic with 2 week history of reported cough. Occasionally productive. No hemoptysis. No fever. Appetite okay. There is some question of asthma history. He did notice some wheezing intermittently. No nausea or vomiting.  Past Medical History:  Diagnosis Date  . Allergic rhinitis, cause unspecified   . Arthritis   . Atrial fibrillation (HCC)    a. Was on Coumadin until GIB 01/2014.  . Bronchiectasis without acute exacerbation (HCC)   . Coronary artery disease    a. s/p CABG 1993. b. s/p PCI of SVG to OM '03.  . Disorders of diaphragm   . Diverticulosis   . Epistaxis   . GERD (gastroesophageal reflux disease)   . H/O: GI bleed    a. 01/2014 - presumed diverticular. Received PRBC, Vit K. Coumadin put on hold.  . Hyperlipidemia   . Hypertension   . LV dysfunction    a. Echo 08/2013: EF 45-50%.  . Myocardial infarction   . Plantar fascial fibromatosis   . Spinal stenosis, unspecified region other than cervical   . Tachy-brady syndrome [facility](HCC)    s/p Medtronic PPM '07  . Unspecified sinusitis (chronic)    Past Surgical History:  Procedure Laterality Date  . CATARACT EXTRACTION W/ INTRAOCULAR LENS  IMPLANT, BILATERAL    . CHOLECYSTECTOMY    . COLONOSCOPY    . CORONARY ARTERY BYPASS GRAFT     LIMA to LAD, SVG to OM, SVG to left circumflex, SVG to PD/PLSA  . DOPPLER ECHOCARDIOGRAPHY  2011  . HIP ARTHROPLASTY Right 12/10/2013   Procedure: Right Hip Cemented Hemiarthroplasty;  Surgeon: Eldred MangesMark C Yates, MD;  Location: [facility] OR;  Service: Orthopedics;  Laterality: Right;  Right Hip Cemented Hemiarthroplasty  . KIDNEY CYST REMOVAL    . PACEMAKER INSERTION     2007, Medtronic dual-chamber  . PERMANENT PACEMAKER GENERATOR CHANGE N/A 08/14/2014   Procedure: PERMANENT  PACEMAKER GENERATOR CHANGE;  Surgeon: Marinus MawGregg W Taylor, MD;  [facility];  Service: Cardiovascular;  Laterality: N/A;  . VERTEBROPLASTY      reports that he has quit smoking. He has never used smokeless tobacco. He reports that he does not drink alcohol or use drugs. family history includes Cancer in his son; Diabetes in his son; Stroke in his mother. Allergies  Allergen Reactions  . Asparaginase Derivatives Other (See Comments)    Makes jittery & causes vision problems, headaches  . Doxycycline Hyclate Diarrhea and Nausea And Vomiting  . Erythromycin Diarrhea and Nausea And Vomiting  . Tetracycline Diarrhea and Nausea And Vomiting  . Ambien [Zolpidem Tartrate] Other (See Comments)    unknown  . Caffeine Other (See Comments)    Interrupts sleep  . Clindamycin Other (See Comments)    REACTION: upset stomach  . Lactose Intolerance (Gi) Other (See Comments)    unknown  . Lyrica [Pregabalin] Other (See Comments)    unknown  . Metronidazole Itching and Rash  . Nitrostat [Nitroglycerin] Diarrhea  . Penicillins Hives    Has patient had a PCN reaction causing immediate rash, facial/tongue/throat swelling, SOB or lightheadedness with hypotension: Yes Has patient had a PCN reaction causing severe rash involving mucus membranes or skin necrosis: No Has patient had a PCN reaction that required hospitalization No Has patient had a PCN reaction occurring within  the last 10 years: No If all of the above answers are "NO", then may proceed with Cephalosporin use.   . Sulfonamide Derivatives Hives     Review of Systems  Constitutional: Negative for chills and fatigue.  Respiratory: Positive for cough and wheezing.        Objective:   Physical Exam  Constitutional: He appears well-developed and well-nourished.  HENT:  Right Ear: External ear normal.  Left Ear: External ear normal.  Mouth/Throat: Oropharynx is clear and moist.  Neck: Neck supple.  Cardiovascular: Normal rate and  regular rhythm.   Pulmonary/Chest: He has wheezes. He has no rales.  Lymphadenopathy:    He has no cervical adenopathy.       Assessment:     Patient seen with 2 week history of cough with reactive airway component. He is in no respiratory distress with pulse oximetry 97%    Plan:     -Prednisone taper over the next week -Levaquin 500 milligrams once daily for 7 days -Follow-up promptly for any fever or increasing shortness of breath  Kristian Covey MD [facility]

## 2017-03-12 NOTE — Progress Notes (Signed)
Pre visit review using our clinic review tool, if applicable. No additional management support is needed unless otherwise documented below in the visit note. 

## 2017-03-12 NOTE — Patient Instructions (Signed)
Follow up promptly for any fever or increasing shortness of breath. 

## 2017-03-14 ENCOUNTER — Other Ambulatory Visit: Payer: Self-pay | Admitting: Cardiology

## 2017-03-14 MED ORDER — METOPROLOL TARTRATE 25 MG PO TABS: 25.0000 mg | ORAL_TABLET | Freq: Two times a day (BID) | ORAL | 3 refills | Status: DC

## 2017-03-14 NOTE — Telephone Encounter (Signed)
Pt's wife calling for a refill on metoprolol 25 mg tablet for the pt. Please advise

## 2017-03-16 ENCOUNTER — Ambulatory Visit: Payer: Medicare Other

## 2017-03-16 ENCOUNTER — Telehealth: Payer: Self-pay | Admitting: Internal Medicine

## 2017-03-16 NOTE — Telephone Encounter (Signed)
Pt need to continued to get B12 injection monthly/per Dr. Yetta BarreJones.

## 2017-03-17 ENCOUNTER — Ambulatory Visit (INDEPENDENT_AMBULATORY_CARE_PROVIDER_SITE_OTHER): Payer: Medicare Other

## 2017-03-17 DIAGNOSIS — E538 Deficiency of other specified B group vitamins: Secondary | ICD-10-CM | POA: Diagnosis not present

## 2017-03-17 MED ORDER — CYANOCOBALAMIN 1000 MCG/ML IJ SOLN
1000.0000 ug | Freq: Once | INTRAMUSCULAR | Status: AC
  Administered 2017-03-17: 1000 ug via INTRAMUSCULAR

## 2017-03-21 ENCOUNTER — Other Ambulatory Visit: Payer: Self-pay

## 2017-03-21 MED ORDER — METOPROLOL TARTRATE 25 MG PO TABS: 25.0000 mg | ORAL_TABLET | Freq: Two times a day (BID) | ORAL | 3 refills | Status: AC

## 2017-04-07 ENCOUNTER — Ambulatory Visit (INDEPENDENT_AMBULATORY_CARE_PROVIDER_SITE_OTHER): Payer: Medicare Other | Admitting: Internal Medicine

## 2017-04-07 ENCOUNTER — Ambulatory Visit: Payer: Medicare Other

## 2017-04-07 VITALS — BP 118/60 | HR 82 | Temp 97.5°F | Resp 16 | Ht 71.0 in | Wt 146.0 lb

## 2017-04-07 DIAGNOSIS — I25810 Atherosclerosis of coronary artery bypass graft(s) without angina pectoris: Secondary | ICD-10-CM

## 2017-04-07 DIAGNOSIS — I10 Essential (primary) hypertension: Secondary | ICD-10-CM | POA: Diagnosis not present

## 2017-04-07 DIAGNOSIS — M810 Age-related osteoporosis without current pathological fracture: Secondary | ICD-10-CM

## 2017-04-07 DIAGNOSIS — D518 Other vitamin B12 deficiency anemias: Secondary | ICD-10-CM

## 2017-04-07 MED ORDER — DENOSUMAB 60 MG/ML ~~LOC~~ SOLN
60.0000 mg | SUBCUTANEOUS | Status: AC
  Administered 2017-04-07: 60 mg via SUBCUTANEOUS

## 2017-04-07 MED ORDER — CYANOCOBALAMIN 1000 MCG/ML IJ SOLN
1000.0000 ug | Freq: Once | INTRAMUSCULAR | Status: AC
  Administered 2017-04-07: 1000 ug via INTRAMUSCULAR

## 2017-04-07 NOTE — Progress Notes (Signed)
Subjective:  Patient ID: Dwayne [name], male    DOB: 1932-12-07  Age: 83 y.o. MRN: [id number]  CC: Hypertension   HPI [name] presents for a BP check and B12 injection. He complains of intermittent episodes of chronic dizziness but offers no other complaints.  Outpatient Medications Prior to Visit  Medication Sig Dispense Refill  . apixaban (ELIQUIS) 5 MG TABS tablet Take 1 tablet (5 mg total) by mouth 2 (two) times daily. 180 tablet 3  . Calcium-Magnesium-Vitamin D (CITRACAL CALCIUM+D) 600-40-500 MG-MG-UNIT TB24 Take 2 tablets by mouth daily.     . cyanocobalamin (,VITAMIN B-12,) 1000 MCG/ML injection Inject 1,000 mcg into the muscle every 21 ( twenty-one) days.    . Doxepin HCl (SILENOR) 6 MG TABS Take 1 tablet (6 mg total) by mouth at bedtime as needed. 90 tablet 1  . fexofenadine (ALLEGRA) 180 MG tablet Take 180 mg by mouth at bedtime as needed (for allergies).     . finasteride (PROSCAR) 5 MG tablet Take 1 tablet (5 mg total) by mouth at bedtime. 90 tablet 3  . metoprolol tartrate (LOPRESSOR) 25 MG tablet Take 1 tablet (25 mg total) by mouth 2 (two) times daily. 180 tablet 3  . nitroGLYCERIN (NITROSTAT) 0.4 MG SL tablet Place 1 tablet (0.4 mg total) under the tongue every 5 (five) minutes x 3 doses as needed for chest pain. 25 tablet 1  . omeprazole (PRILOSEC) 40 MG capsule Take 1 capsule (40 mg total) by mouth daily. 90 capsule 1  . polyethylene glycol (MIRALAX / GLYCOLAX) packet Take 17 g by mouth daily as needed for moderate constipation.     [name] Kitchen PROLIA 60 MG/ML SOLN injection Inject 60 mg into the skin every 6 (six) months.     . terazosin (HYTRIN) 5 MG capsule Take 1 capsule (5 mg total) by mouth daily. 90 capsule 3  . levofloxacin (LEVAQUIN) 500 MG tablet Take 1 tablet (500 mg total) by mouth daily. 7 tablet 0  . predniSONE (DELTASONE) 10 MG tablet Taper as follows: 4-4-3-3-2-2 18 tablet 0   No facility-administered medications prior to visit.     ROS Review of Systems    Constitutional: Negative for appetite change, diaphoresis, fatigue and unexpected weight change.  HENT: Negative.   Eyes: Negative for visual disturbance.  Respiratory: Negative for cough, chest tightness, shortness of breath and wheezing.   Cardiovascular: Negative for chest pain, palpitations and leg swelling.  Gastrointestinal: Negative for abdominal pain, constipation, diarrhea, nausea and vomiting.  Endocrine: Negative.   Genitourinary: Negative.   Musculoskeletal: Negative.  Negative for back pain, myalgias and neck pain.  Skin: Negative.  Negative for color change and rash.  Neurological: Positive for dizziness. Negative for weakness, light-headedness and numbness.  Hematological: Negative for adenopathy. Does not bruise/bleed easily.  Psychiatric/Behavioral: Negative.     Objective:  BP 118/60 (BP Location: Left Arm, Patient Position: Sitting, Cuff Size: Normal)   Pulse 82   Temp 97.5 F (36.4 C) (Oral)   Resp 16   Ht  (1.803 m)   Wt 146 lb (66.2 kg)   SpO2 100%   BMI 20.36 kg/m   BP Readings from Last 3 Encounters:  04/07/17 118/60  03/12/17 128/84  02/22/17 96/60    Wt Readings from Last 3 Encounters:  04/07/17 146 lb (66.2 kg)  02/22/17 148 lb 6.4 oz (67.3 kg)  01/04/17 144 lb (65.3 kg)    Physical Exam  Constitutional: He is oriented to person, place, and time.  No distress.  HENT:  Mouth/Throat: Oropharynx is clear and moist. No oropharyngeal exudate.  Eyes: Conjunctivae are normal. Right eye exhibits no discharge. Left eye exhibits no discharge. No scleral icterus.  Neck: Normal range of motion. Neck supple. No JVD present. No tracheal deviation present. No thyromegaly present.  Cardiovascular: Normal rate, regular rhythm and intact distal pulses.  Exam reveals no gallop and no friction rub.   Murmur heard.  Systolic murmur is present with a grade of 1/6   No diastolic murmur is present  Pulmonary/Chest: Effort normal and breath sounds normal. No  stridor. No respiratory distress. He has no wheezes. He has no rales. He exhibits no tenderness.  Abdominal: Soft. Bowel sounds are normal. He exhibits no distension and no mass. There is no tenderness. There is no rebound and no guarding.  Musculoskeletal: Normal range of motion. He exhibits no edema, tenderness or deformity.  Lymphadenopathy:    He has no cervical adenopathy.  Neurological: He is oriented to person, place, and time.  Skin: Skin is warm and dry. No rash noted. He is not diaphoretic. No erythema. No pallor.  Vitals reviewed.   Lab Results  Component Value Date   WBC 9.9 01/04/2017   HGB 15.1 01/04/2017   HCT 45.2 01/04/2017   PLT 188.0 01/04/2017   GLUCOSE 89 01/04/2017   CHOL 178 02/22/2017   TRIG 149.0 02/22/2017   HDL 44.00 02/22/2017   LDLCALC 104 (H) 02/22/2017   ALT 10 01/04/2017   AST 20 01/04/2017   NA 139 01/04/2017   K 5.4 (H) 01/04/2017   CL 107 01/04/2017   CREATININE 0.79 01/04/2017   BUN 18 01/04/2017   CO2 25 01/04/2017   TSH 1.22 01/04/2017   PSA 3.29 02/07/2015   INR 1.4 (H) 02/21/2014   HGBA1C 5.9 03/11/2015    Ct Lumbar Spine Wo Contrast  Result Date: 08/08/2016 CLINICAL DATA:  Chronic back pain. History of vertebroplasty and dementia. No reported recent injury. EXAM: CT LUMBAR SPINE WITHOUT CONTRAST TECHNIQUE: Multidetector CT imaging of the lumbar spine was performed without intravenous contrast administration. Multiplanar CT image reconstructions were also generated. COMPARISON:  Radiographs 08/03/2016.  CT 07/30/2016. FINDINGS: Segmentation: Normal. Alignment: Stable grade 1 anterolisthesis at L4-5 secondary to facet disease. Vertebrae: Again demonstrated is a subacute L1 burst fracture, not significantly changed in appearance from recent study. There is greater than 50% loss of vertebral body height centrally and mild osseous retropulsion. Remote L4 fracture also appears stable status post spinal augmentation. This fracture is also  associated with significant loss of vertebral body height and osseous retropulsion. No new fractures are seen. There are possible sequela old insufficiency fractures in the sacrum, grossly stable. Paraspinal and other soft tissues: Stable paraspinal hemorrhage at L1. Extensive aortoiliac atherosclerosis. Sigmoid colon diverticular changes are noted. Disc levels: L1-2: Stable mild multifactorial spinal stenosis secondary to annular disc bulging, facet and ligamentous hypertrophy. L2-3: Severe multifactorial spinal stenosis secondary to annular disc bulging, facet and ligamentous hypertrophy. L3-4: Severe multifactorial spinal stenosis secondary to annular disc bulging, facet and ligamentous hypertrophy. Moderate foraminal narrowing, worse on the right. L4-5: Severe multifactorial spinal stenosis secondary to annular disc bulging, facet and ligamentous hypertrophy. Severe foraminal narrowing bilaterally with probable bilateral L4 nerve root encroachment, similar to previous study. L5-S1: Stable partially calcified central and left foraminal disc protrusion with moderate facet and ligamentous hypertrophy. Persistent severe left foraminal narrowing. IMPRESSION: 1. Compared with the prior CT from 3 weeks ago, no significant changes are identified. 2. The  subacute burst fracture at L1 has not significantly changed. There is associated osseous retropulsion and greater than 50% loss of vertebral body height. 3. Stable chronic L4 fracture status post spinal augmentation. No new fractures. 4. Severe multilevel spondylosis contributing to severe multifactorial spinal stenosis from L2-3 through L4-5. Similar significant foraminal narrowing, especially at L4-5. Electronically Signed   By: Carey Bullocks M.D.   On: 08/08/2016 20:51    Assessment & Plan:   Elizeo was seen today for hypertension.  Diagnoses and all orders for this visit:  ANEMIA, B12 DEFICIENCY -     cyanocobalamin ((VITAMIN B-12)) injection 1,000 mcg;  Inject 1 mL (1,000 mcg total) into the muscle once.  Age-related osteoporosis without current pathological fracture -     denosumab (PROLIA) injection 60 mg; Inject 60 mg into the skin every 6 (six) months.  Essential hypertension, benign- his BP is well controlled   I have discontinued Mr. Grauberger predniSONE and levofloxacin. I am also having him maintain his fexofenadine, Calcium-Magnesium-Vitamin D, polyethylene glycol, PROLIA, cyanocobalamin, nitroGLYCERIN, omeprazole, finasteride, terazosin, apixaban, Doxepin HCl, and metoprolol tartrate. We administered denosumab and cyanocobalamin. We will continue to administer denosumab.  Meds ordered this encounter  Medications  . denosumab (PROLIA) injection 60 mg  . cyanocobalamin ((VITAMIN B-12)) injection 1,000 mcg     Follow-up: Return if symptoms worsen or fail to improve.  Sanda Linger, MD

## 2017-04-07 NOTE — Progress Notes (Signed)
Pre visit review using our clinic review tool, if applicable. No additional management support is needed unless otherwise documented below in the visit note. 

## 2017-04-07 NOTE — Patient Instructions (Signed)

## 2017-04-08 ENCOUNTER — Encounter: Payer: Self-pay | Admitting: Internal Medicine

## 2017-05-02 ENCOUNTER — Ambulatory Visit (INDEPENDENT_AMBULATORY_CARE_PROVIDER_SITE_OTHER): Payer: Medicare Other

## 2017-05-02 DIAGNOSIS — D518 Other vitamin B12 deficiency anemias: Secondary | ICD-10-CM | POA: Diagnosis not present

## 2017-05-02 MED ORDER — CYANOCOBALAMIN 1000 MCG/ML IJ SOLN
1000.0000 ug | Freq: Once | INTRAMUSCULAR | Status: AC
  Administered 2017-05-02: 1000 ug via INTRAMUSCULAR

## 2017-05-17 ENCOUNTER — Telehealth: Payer: Self-pay | Admitting: Internal Medicine

## 2017-05-17 NOTE — Telephone Encounter (Signed)
Pt wife called in and wanted a call back from a nurse.  I could not make out what she was saying. She said it a few time but could not make it out.  Sorry [employer]

## 2017-05-17 NOTE — Telephone Encounter (Signed)
Spoke to spouse and she stated that pt is bruising easily. Much easier than usual. Pt is not have any other symptoms.   Scheduled pt to come in on Thursday June 7th for OV and B12

## 2017-05-23 ENCOUNTER — Ambulatory Visit: Payer: Medicare Other

## 2017-05-24 ENCOUNTER — Ambulatory Visit: Payer: Medicare Other | Admitting: Internal Medicine

## 2017-05-26 ENCOUNTER — Ambulatory Visit (INDEPENDENT_AMBULATORY_CARE_PROVIDER_SITE_OTHER): Payer: Medicare Other | Admitting: Internal Medicine

## 2017-05-26 ENCOUNTER — Encounter: Payer: Self-pay | Admitting: Internal Medicine

## 2017-05-26 VITALS — BP 122/68 | HR 60 | Temp 97.5°F | Resp 16 | Ht 71.0 in | Wt 148.5 lb

## 2017-05-26 DIAGNOSIS — I25810 Atherosclerosis of coronary artery bypass graft(s) without angina pectoris: Secondary | ICD-10-CM | POA: Diagnosis not present

## 2017-05-26 DIAGNOSIS — I10 Essential (primary) hypertension: Secondary | ICD-10-CM

## 2017-05-26 DIAGNOSIS — I480 Paroxysmal atrial fibrillation: Secondary | ICD-10-CM

## 2017-05-26 DIAGNOSIS — D518 Other vitamin B12 deficiency anemias: Secondary | ICD-10-CM | POA: Diagnosis not present

## 2017-05-26 MED ORDER — CYANOCOBALAMIN 1000 MCG/ML IJ SOLN
1000.0000 ug | Freq: Once | INTRAMUSCULAR | Status: AC
  Administered 2017-05-26: 1000 ug via INTRAMUSCULAR

## 2017-05-26 NOTE — Progress Notes (Signed)
Subjective:  Patient ID: Dwayne [name], male    DOB: [birth year]  Age: 83 y.o. MRN: [id number]  CC: Atrial Fibrillation and Hypertension   HPI Dwayne [name] presents for a BP check - He is doing well and offers no new complaints today.  Outpatient Medications Prior to Visit  Medication Sig Dispense Refill  . apixaban (ELIQUIS) 5 MG TABS tablet Take 1 tablet (5 mg total) by mouth 2 (two) times daily. 180 tablet 3  . Calcium-Magnesium-Vitamin D (CITRACAL CALCIUM+D) 600-40-500 MG-MG-UNIT TB24 Take 2 tablets by mouth daily.     . cyanocobalamin (,VITAMIN B-12,) 1000 MCG/ML injection Inject 1,000 mcg into the muscle every 21 ( twenty-one) days.    . Doxepin HCl (SILENOR) 6 MG TABS Take 1 tablet (6 mg total) by mouth at bedtime as needed. 90 tablet 1  . fexofenadine (ALLEGRA) 180 MG tablet Take 180 mg by mouth at bedtime as needed (for allergies).     . finasteride (PROSCAR) 5 MG tablet Take 1 tablet (5 mg total) by mouth at bedtime. 90 tablet 3  . metoprolol tartrate (LOPRESSOR) 25 MG tablet Take 1 tablet (25 mg total) by mouth 2 (two) times daily. 180 tablet 3  . nitroGLYCERIN (NITROSTAT) 0.4 MG SL tablet Place 1 tablet (0.4 mg total) under the tongue every 5 (five) minutes x 3 doses as needed for chest pain. 25 tablet 1  . omeprazole (PRILOSEC) 40 MG capsule Take 1 capsule (40 mg total) by mouth daily. 90 capsule 1  . polyethylene glycol (MIRALAX / GLYCOLAX) packet Take 17 g by mouth daily as needed for moderate constipation.     [name] Kitchen PROLIA 60 MG/ML SOLN injection Inject 60 mg into the skin every 6 (six) months.     . terazosin (HYTRIN) 5 MG capsule Take 1 capsule (5 mg total) by mouth daily. 90 capsule 3   Facility-Administered Medications Prior to Visit  Medication Dose Route Frequency Provider Last Rate Last Dose  . denosumab (PROLIA) injection 60 mg  60 mg Subcutaneous Q6 months [name], MD   60 mg at 04/07/17 1618    ROS Review of Systems  Constitutional: Negative for activity  change, appetite change, diaphoresis, fatigue and unexpected weight change.  HENT: Negative.  Negative for trouble swallowing.   Eyes: Negative for visual disturbance.  Respiratory: Negative for cough, chest tightness, shortness of breath and wheezing.   Cardiovascular: Negative for chest pain, palpitations and leg swelling.  Gastrointestinal: Negative.  Negative for abdominal pain, constipation, diarrhea, nausea and vomiting.  Endocrine: Negative.   Genitourinary: Negative.  Negative for difficulty urinating and dysuria.  Musculoskeletal: Negative.   Skin: Negative.   Neurological: Negative.  Negative for dizziness, weakness and light-headedness.  Hematological: Negative for adenopathy. Does not bruise/bleed easily.  Psychiatric/Behavioral: Negative.     Objective:  BP 122/68 (BP Location: Left Arm, Patient Position: Sitting, Cuff Size: Normal)   Pulse 60   Temp 97.5 F (36.4 C) (Oral)   Resp 16   Ht 5\' 11"  (1.803 m)   Wt 148 lb 8 oz (67.4 kg)   SpO2 99%   BMI 20.71 kg/m   BP Readings from Last 3 Encounters:  05/26/17 122/68  04/07/17 118/60  03/12/17 128/84    Wt Readings from Last 3 Encounters:  05/26/17 148 lb 8 oz (67.4 kg)  04/07/17 146 lb (66.2 kg)  02/22/17 148 lb 6.4 oz (67.3 kg)    Physical Exam  Constitutional: He is oriented to person, place, and time.  No distress.  HENT:  Mouth/Throat: Oropharynx is clear and moist. No oropharyngeal exudate.  Eyes: Conjunctivae are normal. Right eye exhibits no discharge. Left eye exhibits no discharge. No scleral icterus.  Neck: Normal range of motion. Neck supple. No JVD present. No thyromegaly present.  Cardiovascular: Normal rate, regular rhythm and intact distal pulses.  Exam reveals no gallop and no friction rub.   Murmur (1/6 SEM) heard. Pulmonary/Chest: Effort normal and breath sounds normal. No respiratory distress. He has no wheezes. He has no rales. He exhibits no tenderness.  Abdominal: Soft. Bowel sounds are  normal. He exhibits no mass. There is no tenderness. There is no rebound.  Musculoskeletal: Normal range of motion. He exhibits no edema or tenderness.  Lymphadenopathy:    He has no cervical adenopathy.  Neurological: He is alert and oriented to person, place, and time.  Skin: Skin is warm and dry. No rash noted. He is not diaphoretic. No erythema. No pallor.  Vitals reviewed.   Lab Results  Component Value Date   WBC 9.9 01/04/2017   HGB 15.1 01/04/2017   HCT 45.2 01/04/2017   PLT 188.0 01/04/2017   GLUCOSE 89 01/04/2017   CHOL 178 02/22/2017   TRIG 149.0 02/22/2017   HDL 44.00 02/22/2017   LDLCALC 104 (H) 02/22/2017   ALT 10 01/04/2017   AST 20 01/04/2017   NA 139 01/04/2017   K 5.4 (H) 01/04/2017   CL 107 01/04/2017   CREATININE 0.79 01/04/2017   BUN 18 01/04/2017   CO2 25 01/04/2017   TSH 1.22 01/04/2017   PSA 3.29 02/07/2015   INR 1.4 (H) 02/21/2014   HGBA1C 5.9 03/11/2015    Ct Lumbar Spine Wo Contrast  Result Date: 08/08/2016 CLINICAL DATA:  Chronic back pain. History of vertebroplasty and dementia. No reported recent injury. EXAM: CT LUMBAR SPINE WITHOUT CONTRAST TECHNIQUE: Multidetector CT imaging of the lumbar spine was performed without intravenous contrast administration. Multiplanar CT image reconstructions were also generated. COMPARISON:  Radiographs 08/03/2016.  CT 07/30/2016. FINDINGS: Segmentation: Normal. Alignment: Stable grade 1 anterolisthesis at L4-5 secondary to facet disease. Vertebrae: Again demonstrated is a subacute L1 burst fracture, not significantly changed in appearance from recent study. There is greater than 50% loss of vertebral body height centrally and mild osseous retropulsion. Remote L4 fracture also appears stable status post spinal augmentation. This fracture is also associated with significant loss of vertebral body height and osseous retropulsion. No new fractures are seen. There are possible sequela old insufficiency fractures in the  sacrum, grossly stable. Paraspinal and other soft tissues: Stable paraspinal hemorrhage at L1. Extensive aortoiliac atherosclerosis. Sigmoid colon diverticular changes are noted. Disc levels: L1-2: Stable mild multifactorial spinal stenosis secondary to annular disc bulging, facet and ligamentous hypertrophy. L2-3: Severe multifactorial spinal stenosis secondary to annular disc bulging, facet and ligamentous hypertrophy. L3-4: Severe multifactorial spinal stenosis secondary to annular disc bulging, facet and ligamentous hypertrophy. Moderate foraminal narrowing, worse on the right. L4-5: Severe multifactorial spinal stenosis secondary to annular disc bulging, facet and ligamentous hypertrophy. Severe foraminal narrowing bilaterally with probable bilateral L4 nerve root encroachment, similar to previous study. L5-S1: Stable partially calcified central and left foraminal disc protrusion with moderate facet and ligamentous hypertrophy. Persistent severe left foraminal narrowing. IMPRESSION: 1. Compared with the prior CT from 3 weeks ago, no significant changes are identified. 2. The subacute burst fracture at L1 has not significantly changed. There is associated osseous retropulsion and greater than 50% loss of vertebral body height. 3. Stable chronic L4  fracture status post spinal augmentation. No new fractures. 4. Severe multilevel spondylosis contributing to severe multifactorial spinal stenosis from L2-3 through L4-5. Similar significant foraminal narrowing, especially at L4-5. Electronically Signed   By: Carey Bullocks M.D.   On: 08/08/2016 20:51    Assessment & Plan:   Dwayne [name] was seen today for atrial fibrillation and hypertension.  Diagnoses and all orders for this visit:  Paroxysmal atrial fibrillation (HCC)- he has good rate and rhythm control, will continue metoprolol and anticoagulation  Essential hypertension, benign- his blood pressure is well-controlled  ANEMIA, B12 DEFICIENCY -      cyanocobalamin ((VITAMIN B-12)) injection 1,000 mcg; Inject 1 mL (1,000 mcg total) into the muscle once.  Other orders -     Cancel: Urinalysis, Routine w reflex microscopic; Future   I am having Dwayne [name] maintain his fexofenadine, Calcium-Magnesium-Vitamin D, polyethylene glycol, PROLIA, cyanocobalamin, nitroGLYCERIN, omeprazole, finasteride, terazosin, apixaban, Doxepin HCl, metoprolol tartrate, and GNP MELATONIN PO. We administered cyanocobalamin. We will continue to administer denosumab.  Meds ordered this encounter  Medications  . GNP MELATONIN PO    Sig: Take by mouth.  . cyanocobalamin ((VITAMIN B-12)) injection 1,000 mcg     Follow-up: Return in about 6 months (around 11/25/2017).  Sanda Linger, MD

## 2017-05-26 NOTE — Patient Instructions (Signed)

## 2017-06-01 DIAGNOSIS — Z961 Presence of intraocular lens: Secondary | ICD-10-CM | POA: Diagnosis not present

## 2017-06-01 DIAGNOSIS — H5211 Myopia, right eye: Secondary | ICD-10-CM | POA: Diagnosis not present

## 2017-06-01 DIAGNOSIS — H01002 Unspecified blepharitis right lower eyelid: Secondary | ICD-10-CM | POA: Diagnosis not present

## 2017-06-01 DIAGNOSIS — H35371 Puckering of macula, right eye: Secondary | ICD-10-CM | POA: Diagnosis not present

## 2017-06-01 DIAGNOSIS — H01001 Unspecified blepharitis right upper eyelid: Secondary | ICD-10-CM | POA: Diagnosis not present

## 2017-06-01 DIAGNOSIS — H5213 Myopia, bilateral: Secondary | ICD-10-CM | POA: Diagnosis not present

## 2017-06-01 DIAGNOSIS — H01004 Unspecified blepharitis left upper eyelid: Secondary | ICD-10-CM | POA: Diagnosis not present

## 2017-06-01 DIAGNOSIS — H353111 Nonexudative age-related macular degeneration, right eye, early dry stage: Secondary | ICD-10-CM | POA: Diagnosis not present

## 2017-06-16 ENCOUNTER — Ambulatory Visit (INDEPENDENT_AMBULATORY_CARE_PROVIDER_SITE_OTHER): Payer: Medicare Other

## 2017-06-16 DIAGNOSIS — D518 Other vitamin B12 deficiency anemias: Secondary | ICD-10-CM | POA: Diagnosis not present

## 2017-06-16 MED ORDER — CYANOCOBALAMIN 1000 MCG/ML IJ SOLN
1000.0000 ug | Freq: Once | INTRAMUSCULAR | Status: AC
  Administered 2017-06-16: 1000 ug via INTRAMUSCULAR

## 2017-06-20 DIAGNOSIS — H35373 Puckering of macula, bilateral: Secondary | ICD-10-CM | POA: Diagnosis not present

## 2017-06-20 DIAGNOSIS — H43813 Vitreous degeneration, bilateral: Secondary | ICD-10-CM | POA: Diagnosis not present

## 2017-06-20 DIAGNOSIS — H353132 Nonexudative age-related macular degeneration, bilateral, intermediate dry stage: Secondary | ICD-10-CM | POA: Diagnosis not present

## 2017-07-06 ENCOUNTER — Ambulatory Visit (INDEPENDENT_AMBULATORY_CARE_PROVIDER_SITE_OTHER): Payer: Medicare Other

## 2017-07-06 DIAGNOSIS — D518 Other vitamin B12 deficiency anemias: Secondary | ICD-10-CM

## 2017-07-06 MED ORDER — CYANOCOBALAMIN 1000 MCG/ML IJ SOLN
1000.0000 ug | Freq: Once | INTRAMUSCULAR | Status: AC
  Administered 2017-07-06: 1000 ug via INTRAMUSCULAR

## 2017-07-20 DIAGNOSIS — R402411 Glasgow coma scale score 13-15, in the field [EMT or ambulance]: Secondary | ICD-10-CM | POA: Diagnosis not present

## 2017-07-20 DIAGNOSIS — R531 Weakness: Secondary | ICD-10-CM | POA: Diagnosis not present

## 2017-07-21 ENCOUNTER — Emergency Department (HOSPITAL_COMMUNITY): Payer: Medicare Other

## 2017-07-21 ENCOUNTER — Encounter (HOSPITAL_COMMUNITY): Payer: Self-pay

## 2017-07-21 ENCOUNTER — Inpatient Hospital Stay (HOSPITAL_COMMUNITY)
Admission: EM | Admit: 2017-07-21 | Discharge: 2017-07-24 | DRG: 918 | Disposition: A | Discharge status: Skilled Nursing Facility | Payer: Medicare Other | Attending: Family Medicine | Admitting: Family Medicine

## 2017-07-21 DIAGNOSIS — Z823 Family history of stroke: Secondary | ICD-10-CM | POA: Diagnosis not present

## 2017-07-21 DIAGNOSIS — Y92019 Unspecified place in single-family (private) house as the place of occurrence of the external cause: Secondary | ICD-10-CM | POA: Diagnosis not present

## 2017-07-21 DIAGNOSIS — T50901A Poisoning by unspecified drugs, medicaments and biological substances, accidental (unintentional), initial encounter: Principal | ICD-10-CM | POA: Diagnosis present

## 2017-07-21 DIAGNOSIS — Z950 Presence of cardiac pacemaker: Secondary | ICD-10-CM

## 2017-07-21 DIAGNOSIS — I25810 Atherosclerosis of coronary artery bypass graft(s) without angina pectoris: Secondary | ICD-10-CM | POA: Diagnosis present

## 2017-07-21 DIAGNOSIS — Z66 Do not resuscitate: Secondary | ICD-10-CM | POA: Diagnosis present

## 2017-07-21 DIAGNOSIS — Z833 Family history of diabetes mellitus: Secondary | ICD-10-CM | POA: Diagnosis not present

## 2017-07-21 DIAGNOSIS — E538 Deficiency of other specified B group vitamins: Secondary | ICD-10-CM | POA: Diagnosis not present

## 2017-07-21 DIAGNOSIS — R1312 Dysphagia, oropharyngeal phase: Secondary | ICD-10-CM | POA: Diagnosis not present

## 2017-07-21 DIAGNOSIS — R4182 Altered mental status, unspecified: Secondary | ICD-10-CM | POA: Diagnosis not present

## 2017-07-21 DIAGNOSIS — J479 Bronchiectasis, uncomplicated: Secondary | ICD-10-CM

## 2017-07-21 DIAGNOSIS — I252 Old myocardial infarction: Secondary | ICD-10-CM | POA: Diagnosis not present

## 2017-07-21 DIAGNOSIS — Z888 Allergy status to other drugs, medicaments and biological substances status: Secondary | ICD-10-CM

## 2017-07-21 DIAGNOSIS — Z9049 Acquired absence of other specified parts of digestive tract: Secondary | ICD-10-CM

## 2017-07-21 DIAGNOSIS — I519 Heart disease, unspecified: Secondary | ICD-10-CM | POA: Diagnosis not present

## 2017-07-21 DIAGNOSIS — G4700 Insomnia, unspecified: Secondary | ICD-10-CM | POA: Diagnosis present

## 2017-07-21 DIAGNOSIS — G9340 Encephalopathy, unspecified: Secondary | ICD-10-CM | POA: Diagnosis not present

## 2017-07-21 DIAGNOSIS — Z961 Presence of intraocular lens: Secondary | ICD-10-CM | POA: Diagnosis present

## 2017-07-21 DIAGNOSIS — I4891 Unspecified atrial fibrillation: Secondary | ICD-10-CM | POA: Diagnosis present

## 2017-07-21 DIAGNOSIS — Z9889 Other specified postprocedural states: Secondary | ICD-10-CM

## 2017-07-21 DIAGNOSIS — Z87891 Personal history of nicotine dependence: Secondary | ICD-10-CM

## 2017-07-21 DIAGNOSIS — G9341 Metabolic encephalopathy: Secondary | ICD-10-CM | POA: Diagnosis not present

## 2017-07-21 DIAGNOSIS — M6281 Muscle weakness (generalized): Secondary | ICD-10-CM | POA: Diagnosis not present

## 2017-07-21 DIAGNOSIS — R0602 Shortness of breath: Secondary | ICD-10-CM | POA: Diagnosis not present

## 2017-07-21 DIAGNOSIS — Z7901 Long term (current) use of anticoagulants: Secondary | ICD-10-CM | POA: Diagnosis not present

## 2017-07-21 DIAGNOSIS — Z79899 Other long term (current) drug therapy: Secondary | ICD-10-CM | POA: Diagnosis not present

## 2017-07-21 DIAGNOSIS — M1990 Unspecified osteoarthritis, unspecified site: Secondary | ICD-10-CM | POA: Diagnosis present

## 2017-07-21 DIAGNOSIS — E785 Hyperlipidemia, unspecified: Secondary | ICD-10-CM | POA: Diagnosis present

## 2017-07-21 DIAGNOSIS — Z9842 Cataract extraction status, left eye: Secondary | ICD-10-CM

## 2017-07-21 DIAGNOSIS — I10 Essential (primary) hypertension: Secondary | ICD-10-CM | POA: Diagnosis not present

## 2017-07-21 DIAGNOSIS — Z880 Allergy status to penicillin: Secondary | ICD-10-CM | POA: Diagnosis not present

## 2017-07-21 DIAGNOSIS — N401 Enlarged prostate with lower urinary tract symptoms: Secondary | ICD-10-CM | POA: Diagnosis present

## 2017-07-21 DIAGNOSIS — Z9841 Cataract extraction status, right eye: Secondary | ICD-10-CM

## 2017-07-21 DIAGNOSIS — K219 Gastro-esophageal reflux disease without esophagitis: Secondary | ICD-10-CM | POA: Diagnosis present

## 2017-07-21 DIAGNOSIS — M545 Low back pain, unspecified: Secondary | ICD-10-CM | POA: Diagnosis present

## 2017-07-21 DIAGNOSIS — Z96641 Presence of right artificial hip joint: Secondary | ICD-10-CM | POA: Diagnosis present

## 2017-07-21 DIAGNOSIS — F0390 Unspecified dementia without behavioral disturbance: Secondary | ICD-10-CM | POA: Diagnosis present

## 2017-07-21 DIAGNOSIS — R4020 Unspecified coma: Secondary | ICD-10-CM | POA: Diagnosis not present

## 2017-07-21 DIAGNOSIS — I110 Hypertensive heart disease with heart failure: Secondary | ICD-10-CM | POA: Diagnosis not present

## 2017-07-21 DIAGNOSIS — Z9861 Coronary angioplasty status: Secondary | ICD-10-CM

## 2017-07-21 DIAGNOSIS — I480 Paroxysmal atrial fibrillation: Secondary | ICD-10-CM

## 2017-07-21 DIAGNOSIS — I2510 Atherosclerotic heart disease of native coronary artery without angina pectoris: Secondary | ICD-10-CM | POA: Diagnosis not present

## 2017-07-21 DIAGNOSIS — R488 Other symbolic dysfunctions: Secondary | ICD-10-CM | POA: Diagnosis not present

## 2017-07-21 DIAGNOSIS — Z882 Allergy status to sulfonamides status: Secondary | ICD-10-CM | POA: Diagnosis not present

## 2017-07-21 DIAGNOSIS — N139 Obstructive and reflux uropathy, unspecified: Secondary | ICD-10-CM | POA: Diagnosis not present

## 2017-07-21 HISTORY — DX: Unspecified dementia, unspecified severity, without behavioral disturbance, psychotic disturbance, mood disturbance, and anxiety: F03.90

## 2017-07-21 HISTORY — DX: Unspecified dementia without behavioral disturbance: F03.90

## 2017-07-21 LAB — COMPREHENSIVE METABOLIC PANEL
ALT: 10 U/L — ABNORMAL LOW (ref 17–63)
ANION GAP: 7 (ref 5–15)
AST: 17 U/L (ref 15–41)
Albumin: 3.5 g/dL (ref 3.5–5.0)
Alkaline Phosphatase: 59 U/L (ref 38–126)
BILIRUBIN TOTAL: 0.5 mg/dL (ref 0.3–1.2)
BUN: 13 mg/dL (ref 6–20)
CALCIUM: 9 mg/dL (ref 8.9–10.3)
CO2: 25 mmol/L (ref 22–32)
Chloride: 107 mmol/L (ref 101–111)
Creatinine, Ser: 0.75 mg/dL (ref 0.61–1.24)
GFR calc non Af Amer: >60 mL/min (ref >60)
Glucose, Bld: 101 mg/dL — ABNORMAL HIGH (ref 65–99)
Potassium: 3.6 mmol/L (ref 3.5–5.1)
Sodium: 139 mmol/L (ref 135–145)
TOTAL PROTEIN: 6.1 g/dL — AB (ref 6.5–8.1)

## 2017-07-21 LAB — URINALYSIS, ROUTINE W REFLEX MICROSCOPIC
BILIRUBIN URINE: NEGATIVE
Glucose, UA: NEGATIVE mg/dL
HGB URINE DIPSTICK: NEGATIVE
KETONES UR: NEGATIVE mg/dL
Leukocytes, UA: NEGATIVE
NITRITE: NEGATIVE
PH: 5 (ref 5.0–8.0)
Protein, ur: NEGATIVE mg/dL
Specific Gravity, Urine: 1.014 (ref 1.005–1.030)

## 2017-07-21 LAB — CBC
HCT: 40.5 % (ref 39.0–52.0)
HEMOGLOBIN: 13.5 g/dL (ref 13.0–17.0)
MCH: 31.7 pg (ref 26.0–34.0)
MCHC: 33.3 g/dL (ref 30.0–36.0)
MCV: 95.1 fL (ref 78.0–100.0)
PLATELETS: 205 10*3/uL (ref 150–400)
RBC: 4.26 MIL/uL (ref 4.22–5.81)
RDW: 14.1 % (ref 11.5–15.5)
WBC: 9.6 10*3/uL (ref 4.0–10.5)

## 2017-07-21 LAB — RAPID URINE DRUG SCREEN, HOSP PERFORMED
Amphetamines: NOT DETECTED
BARBITURATES: NOT DETECTED
BENZODIAZEPINES: NOT DETECTED
Cocaine: NOT DETECTED
Opiates: NOT DETECTED
TETRAHYDROCANNABINOL: NOT DETECTED

## 2017-07-21 LAB — I-STAT TROPONIN, ED: TROPONIN I, POC: 0.02 ng/mL (ref 0.00–0.08)

## 2017-07-21 LAB — CBG MONITORING, ED: GLUCOSE-CAPILLARY: 91 mg/dL (ref 65–99)

## 2017-07-21 LAB — ETHANOL: Alcohol, Ethyl (B): <5 mg/dL (ref <5)

## 2017-07-21 LAB — SALICYLATE LEVEL

## 2017-07-21 LAB — CK: Total CK: 83 U/L (ref 49–397)

## 2017-07-21 LAB — ACETAMINOPHEN LEVEL: Acetaminophen (Tylenol), Serum: <10 ug/mL — ABNORMAL LOW (ref 10–30)

## 2017-07-21 MED ORDER — LORAZEPAM 1 MG PO TABS
1.0000 mg | ORAL_TABLET | Freq: Three times a day (TID) | ORAL | Status: DC | PRN
  Administered 2017-07-21 – 2017-07-23 (×2): 1 mg via ORAL
  Filled 2017-07-21 (×3): qty 1

## 2017-07-21 MED ORDER — ONDANSETRON HCL 4 MG/2ML IJ SOLN: 4.0000 mg | Freq: Three times a day (TID) | INTRAMUSCULAR | Status: DC | PRN

## 2017-07-21 MED ORDER — NITROGLYCERIN 0.4 MG SL SUBL: 0.4000 mg | SUBLINGUAL_TABLET | SUBLINGUAL | Status: DC | PRN

## 2017-07-21 MED ORDER — ONDANSETRON HCL 4 MG PO TABS: 4.0000 mg | ORAL_TABLET | Freq: Four times a day (QID) | ORAL | Status: DC | PRN

## 2017-07-21 MED ORDER — KCL IN DEXTROSE-NACL 20-5-0.45 MEQ/L-%-% IV SOLN
INTRAVENOUS | Status: AC
  Filled 2017-07-21: qty 1000

## 2017-07-21 MED ORDER — SODIUM CHLORIDE 0.9 % IV SOLN
INTRAVENOUS | Status: DC
  Administered 2017-07-21: 06:00:00 via INTRAVENOUS

## 2017-07-21 MED ORDER — SODIUM CHLORIDE 0.9 % IV BOLUS (SEPSIS)
500.0000 mL | Freq: Once | INTRAVENOUS | Status: AC
  Administered 2017-07-21: 500 mL via INTRAVENOUS

## 2017-07-21 MED ORDER — HYDRALAZINE HCL 20 MG/ML IJ SOLN: 10.0000 mg | Freq: Three times a day (TID) | INTRAMUSCULAR | Status: DC | PRN

## 2017-07-21 MED ORDER — ONDANSETRON HCL 4 MG/2ML IJ SOLN: 4.0000 mg | Freq: Four times a day (QID) | INTRAMUSCULAR | Status: DC | PRN

## 2017-07-21 NOTE — Progress Notes (Signed)
This is a no charge note  Pending admission per Dr. 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  EDP obtained by talking to pt's son on the phone. Per report, he was found unresponsive by his home health nurse. Patient has been chronically demented and was previously being cared by his wife who passed away on July 22. His son reported that patient has had bizarre behavior and has been talking about "going home" and asking the son to bring over his guns. His son is concerned that he could have had an intentional overdose tonight.   Patient's workup here has been unremarkable. Labs are normal including negative troponin, negative Tylenol and salicylate levels, negative urine drug screen and negative alcohol level. Urine shows no sign of infection.  Chest x-ray is clear. 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   EDP contacted poison control (please see the detail note of Dr. Elesa MassedWard), basically, they recommend patient be washed for at least 12 hours because of being on Eliquis.   Per EDP, patient's son reports the patient is a DO NOT RESUSCITATE/DO NOT INTUBATE.  Lorretta HarpXilin Brantleigh Mifflin, MD  [facility] Pager [phone]  If 7PM-7AM, please contact night-coverage [email or web address] Password [email or web address] 07/21/2017, 6:02 AM

## 2017-07-21 NOTE — Progress Notes (Signed)
CSW spoke with Patient son is Dwayne [name] - [phone]- [phone] to complete assessment of pt. Dwayne HuaDavid is pt's POA and is very helpful and useful in giving needed information on pt. CSW spoke with Dwayne HuaDavid about brining another insurance car for pt as son reports that he has another Brewing technologistinsurance policy with another company. CSW mentioned to Dwayne HuaDavid that she would handoff information on chart justin case pt is moved to another place in the hospital. Son was understanding and agreeable to plan of care at this time. CSW will continue to assists with discharge needs as presented.    Dwayne MangesKierra S. Melvena [name], MSW, LCSW-A Emergency Department Clinical Social Worker [phone]

## 2017-07-21 NOTE — ED Notes (Signed)
Attempted to call pt's son, he didn't answer. Will try again later

## 2017-07-21 NOTE — ED Notes (Signed)
Pt reported to have had altered mental status at home. Reported by RN Manson PasseyBrown that the pt might have taken too much of his evening mediation.

## 2017-07-21 NOTE — Clinical Social Work Note (Signed)
Clinical Social Work Assessment  Patient Details  Name: Dwayne [name] MRN: [id number] Date of Birth: 1932-02-25  Date of referral:  07/21/17               Reason for consult:  Facility Placement                Permission sought to share information with:  Family Supports Permission granted to share information::  Yes, Verbal Permission Granted  Name::     AdministratorDavid   Agency::     Relationship::     Contact Information:     Housing/Transportation Living arrangements for the past 2 months:  Single Family Home (with wife who passed away July 22. ) Source of Information:  Adult Children Onalee Hua(David ) Patient Interpreter Needed:  None Criminal Activity/Legal Involvement Pertinent to Current Situation/Hospitalization:  No - Comment as needed Significant Relationships:  Adult Children Lives with:  Self (and wife wife at the time. ) Do you feel safe going back to the place where you live?  No (per pt's son, he does not want pt to go back home. ) Need for family participation in patient care:  Yes (Comment)  Care giving concerns:  CSW spoke with pt's son Onalee Hua(David) via phone. Pt currently has dementia. CSW was informed by pt's son that he doesn't feel that it is safe for pt to return home since the passing of pt's wife. Son is seeking placement for pt and has two different forms of insurance for pt per son report. Son also expressed that if there are any concerns about paying he is pt's POA and will pay out of pocket for pt if need be.    Social Worker assessment / plan:  CSW spoke with pt's son [name] via phone. [name] informed CSW that he is pt's POA and has been handling pt's money ever since pt's wife passed away on July 22. Pt's son seems to think that a lot of what has been taking place over the past few days could be pt mourning the loss of wife. [name] informed CSW that pt and pt's wife lived together up until her passing. She was the primary provider for pt as well as a few nurses who would rotate care  for pt around the clock. It was also mentioned to CSW that pt's son is seeking more of an ALF for pt as opposed to a SNF. Son wanted pt to still have some responsibility in care, just not as much. Son reports that pt has a number of support from family members as well as step children.    Pt's son informed CSW that leading up to this hospitalization, pt made mentions of wanting to go home and asked son for a gun. Pt son reports that initially he didn't think much of the comment and later thought "maybe dad is trying to kill himself". CSW sought further information on this as son reports that maybe pt overdosed on medicine leading to pt's hospitalization at this time.   Son went on to inform CSW that pt went as far as to pack up belongings in hopes of "going home". Son also attributes a lot of pts actions and responses to having sundowners.   Employment status:  Retired Health and safety inspectornsurance information:  Medicare PT Recommendations:  Not assessed at this time Information / Referral to community resources:  Skilled Nursing Facility  Patient/Family's Response to care:  Son is understanding and hopeful for placement for  Pt. He understands that things  may move a little slower than he had hoped due to insurance purposes as well as medical needs at this time. Son has offered to bring any remaining insurance information to the hospital in hopes of that helping with the placement process for pt.   Patient/Family's Understanding of and Emotional Response to Diagnosis, Current Treatment, and Prognosis:  Pt's son is understanding of care at this time. No further questions or comments have been brought to CSW at this time.   Emotional Assessment Appearance:    Attitude/Demeanor/Rapport:  Unable to Assess (spoke with pt's son via phone. ) Affect (typically observed):  Unable to Assess Orientation:  Fluctuating Orientation (Suspected and/or reported Sundowners) Alcohol / Substance use:  Not Applicable Psych involvement  (Current and /or in the community):  No (Comment)  Discharge Needs  Concerns to be addressed:  Home Safety Concerns Readmission within the last 30 days:  No Current discharge risk:  None Barriers to Discharge:  No Barriers Identified   Robb MatarKierra S Elie Leppo, LCSWA 07/21/2017, 11:03 AM

## 2017-07-21 NOTE — ED Notes (Signed)
Discussed with Dr. Melynda RippleHobbs that patient removed his IV earlier & continues to be agitated. Patient has IV fluids ordered. Received order that fluids may be put on hold at this time, and monitor how patient eats/drinks.

## 2017-07-21 NOTE — ED Notes (Signed)
Patient confused. Had shut door to the room. Got out of bed. Took out IV. Took off cardiac leads, pulse ox, blood pressure cuff. Took off all of his clothes. Was attempting to take off brief when staff entered room. Attempted to reorient patient. Patient agitated. Repeats same questions over and over.

## 2017-07-21 NOTE — ED Provider Notes (Signed)
TIME SEEN: 2:12 AM  CHIEF COMPLAINT: Altered mental status  HPI: Patient is an 83 year old man with history of dementia, CAD status post CABG, atrial fibrillation on Eliquis, hyperlipidemia, tachycardia/bradycardia syndrome status post pacemaker  who presents emergency department after he was found unresponsive by his home health nurse. Most of the history is obtained from patient's son who was not at bedside. He states the patient is chronically demented and was previously being cared by his wife who passed away on July 22. He states recently the patient has had bizarre behavior and has been talking about "going home" and asking the son to bring over his guns. Son states he is concerned that he could have had an intentional overdose tonight. Son denies any history of previous suicide attempt. He states that he knows that the patient has been actively taking Ambien over the past several days that he is not supposed to be taking anymore. No other medication changes. Son denies any recent fevers, cough, vomiting or diarrhea. Son denies any recent injuries. Patient lives at home now alone. Son is unclear on all of the details of what happened tonight but per EMS patient was found unresponsive by home health nurse. Blood glucose, vital signs with EMS normal. Patient currently has no complaints. He is unable to provide me any history.   Patient's son reports the patient is a DO NOT RESUSCITATE/DO NOT INTUBATE.   Patient son is Waynard EdwardsDavid Sermeno - [phone]  ROS: level V caveat for dementia   PAST MEDICAL HISTORY/PAST SURGICAL HISTORY:  Past Medical History:  Diagnosis Date  . Allergic rhinitis, cause unspecified   . Arthritis   . Atrial fibrillation (HCC)    a. Was on Coumadin until GIB 01/2014.  . Bronchiectasis without acute exacerbation (HCC)   . Coronary artery disease    a. s/p CABG 1993. b. s/p PCI of SVG to OM '03.  . Disorders of diaphragm   . Diverticulosis   . Epistaxis   . GERD  (gastroesophageal reflux disease)   . H/O: GI bleed    a. 01/2014 - presumed diverticular. Received PRBC, Vit K. Coumadin put on hold.  . Hyperlipidemia   . Hypertension   . LV dysfunction    a. Echo 08/2013: EF 45-50%.  . Myocardial infarction (HCC)   . Plantar fascial fibromatosis   . Spinal stenosis, unspecified region other than cervical   . Tachy-brady syndrome [facility](HCC)    s/p [employer]tr[employer] PPM '07  . Unspecified sinusitis (chronic)     MEDICATIONS:  Prior to Admission medications   Medication Sig Start Date End Date Taking? Authorizing Provider  apixaban (ELIQUIS) 5 MG TABS tablet Take 1 tablet (5 mg total) by mouth 2 (two) times daily. 02/16/17   Lewayne Buntingrenshaw, Brian S, MD  Calcium-Magnesium-Vitamin D (CITRACAL CALCIUM+D) 600-40-500 MG-MG-UNIT TB24 Take 2 tablets by mouth daily.     [provider]  cyanocobalamin (,VITAMIN B-12,) 1000 MCG/ML injection Inject 1,000 mcg into the muscle every 21 ( twenty-one) days.    [provider]  Doxepin HCl (SILENOR) 6 MG TABS Take 1 tablet (6 mg total) by mouth at bedtime as needed. 03/02/17   Etta GrandchildJones, Thomas L, MD  fexofenadine (ALLEGRA) 180 MG tablet Take 180 mg by mouth at bedtime as needed (for allergies).     [provider]  finasteride (PROSCAR) 5 MG tablet Take 1 tablet (5 mg total) by mouth at bedtime. 01/15/17   Etta GrandchildJones, Thomas L, MD  GNP MELATONIN PO Take by mouth.  [provider]  metoprolol tartrate (LOPRESSOR) 25 MG tablet Take 1 tablet (25 mg total) by mouth 2 (two) times daily. 03/21/17   Lewayne Buntingrenshaw, Brian S, MD  nitroGLYCERIN (NITROSTAT) 0.4 MG SL tablet Place 1 tablet (0.4 mg total) under the tongue every 5 (five) minutes x 3 doses as needed for chest pain. 01/02/15   Lewayne Buntingrenshaw, Brian S, MD  omeprazole (PRILOSEC) 40 MG capsule Take 1 capsule (40 mg total) by mouth daily. 01/11/17   Etta GrandchildJones, Thomas L, MD  polyethylene glycol [facility](MIRALAX / Ethelene HalGLYCOLAX) packet Take 17 g by mouth daily as needed for moderate constipation.      [provider]  PROLIA 60 MG/ML SOLN injection Inject 60 mg into the skin every 6 (six) months.  10/04/12   [provider]  terazosin (HYTRIN) 5 MG capsule Take 1 capsule (5 mg total) by mouth daily. 01/15/17   Etta GrandchildJones, Thomas L, MD    ALLERGIES:  Allergies  Allergen Reactions  . Asparaginase Derivatives Other (See Comments)    Makes jittery & causes vision problems, headaches  . Doxycycline Hyclate Diarrhea and Nausea And Vomiting  . Erythromycin Diarrhea and Nausea And Vomiting  . Tetracycline Diarrhea and Nausea And Vomiting  . Ambien [Zolpidem Tartrate] Other (See Comments)    unknown  . Caffeine Other (See Comments)    Interrupts sleep  . Clindamycin Other (See Comments)    REACTION: upset stomach  . Lactose Intolerance (Gi) Other (See Comments)    unknown  . Lyrica [Pregabalin] Other (See Comments)    unknown  . Metronidazole Itching and Rash  . Nitrostat [Nitroglycerin] Diarrhea  . Penicillins Hives    Has patient had a PCN reaction causing immediate rash, facial/tongue/throat swelling, SOB or lightheadedness with hypotension: Yes Has patient had a PCN reaction causing severe rash involving mucus membranes or skin necrosis: No Has patient had a PCN reaction that required hospitalization No Has patient had a PCN reaction occurring within the last 10 years: No If all of the above answers are "NO", then may proceed with Cephalosporin use.   . Sulfonamide Derivatives Hives    SOCIAL HISTORY:  Social History  Substance Use Topics  . Smoking status: Former Games developermoker  . Smokeless tobacco: Never Used     Comment: x 6 months in high school  . Alcohol use No     Comment: none    FAMILY HISTORY: Family History  Problem Relation Age of Onset  . Stroke Mother   . Diabetes Son   . Cancer Son   . Colon cancer Neg Hx   . Heart disease Neg Hx   . Throat cancer Neg Hx   . Pancreatic cancer Neg Hx   . Kidney disease Neg Hx   . Liver disease Neg Hx   .  Osteoporosis Neg Hx     EXAM: BP 130/77   Pulse 60   Temp 97.6 F (36.4 C) (Oral)   Resp (!) 9   Ht 5\' 11"  (1.803 m)   Wt 67.1 kg (148 lb)   SpO2 99%   BMI 20.64 kg/m  CONSTITUTIONAL: Alert and oriented To person and place but not time or situation, elderly, chronically ill-appearing  HEAD: Normocephalic; atraumatic EYES: Conjunctivae clear, PERRL, EOMI ENT: normal nose; no rhinorrhea; moist mucous membranes; pharynx without lesions noted; no dental injury; no septal hematoma NECK: Supple, no meningismus, no LAD; no midline spinal tenderness, step-off or deformity; trachea midline CARD: RRR; S1 and S2 appreciated; no murmurs, no clicks, no rubs,  no gallops RESP: Normal chest excursion without splinting or tachypnea; breath sounds clear and equal bilaterally; no wheezes, no rhonchi, no rales; no hypoxia or respiratory distress CHEST:  chest wall stable, no crepitus or ecchymosis or deformity, nontender to palpation; no flail chest ABD/GI: Normal bowel sounds; non-distended; soft, non-tender, no rebound, no guarding; no ecchymosis or other lesions noted PELVIS:  stable, nontender to palpation BACK:  The back appears normal and is non-tender to palpation, there is no CVA tenderness; no midline spinal tenderness, step-off or deformity EXT: Normal ROM in all joints; non-tender to palpation; no edema; normal capillary refill; no cyanosis, no bony tenderness or bony deformity of patient's extremities, no joint effusion, compartments are soft, extremities are warm and well-perfused, no ecchymosis SKIN: Normal color for age and race; warm NEURO: Moves all extremities equally, reports normal sensation diffusely, normal speech, cranial nerves II through XII intact  PSYCH: The patient's mood and manner are appropriate. Grooming and personal hygiene are appropriate.  MEDICAL DECISION MAKING:  Patient here after being found unresponsive. Son is concerned this could have been a suicide attempt,  possible intentional overdose.  Patient is unable to express to me if he is having suicidality or not given he is very demented. Son reports this is his baseline. I see no obvious signs of trauma on exam or focal neurologic deficits. I did talk to poison control regarding the medications patient is taking which appears to be doxepin, metoprolol, eliquis, finasteride. Also appears he has access to atorvastatin, Macrobid, zolpidem. They state that patient could have QRS widening, hypertension and seizures with doxepin. They also report that patient can have bradycardia, hypotension with metoprolol. Also hypotension with finasteride. Patient could have CNS depression with zolpidem. They recommend patient be washed for at least 12 hours because of being on a liquid.  ED PROGRESS:  Patient's workup here has been unremarkable. Labs are normal including negative troponin, negative Tylenol and salicylate levels, negative urine drug screen and negative alcohol level. Urine shows no sign of infection. CT of the head and cervical spine show no acute abnormality. Chest x-ray is clear. We'll discuss with hospitalist for admission for altered mental status, possible intentional overdose.  4:45 AM Discussed patient's case with hospitalist, Dr. Clyde Lundborg.  I have recommended admission and patient (and family if present) agree with this plan. Admitting physician will place admission orders.   I reviewed all nursing notes, vitals, pertinent previous records, EKGs, lab and urine results, imaging (as available).  Patient is stable and still has no complaints. Patient's son did report the patient is a DO NOT RESUSCITATE. We did discuss however that if the patient's son strongly believes that this is a intentional overdose that ethically we cannot hold a DO NOT RESUSCITATE. I feel patient will be medically cleared from a potential overdose standpoint at noon today (12 hours after arrival to the emergency department) per poison control  recommendation.   EKG Interpretation  Date/Time:  Thursday July 21 2017 00:51:23 EDT Ventricular Rate:  64 PR Interval:    QRS Duration: 183 QT Interval:  480 QTC Calculation: 496 R Axis:   -51 Text Interpretation:  Sinus rhythm Short PR interval Nonspecific IVCD with LAD Left ventricular hypertrophy Abnormal T, consider ischemia, lateral leads No significant change since last tracing Confirmed by Brayden Brodhead, Baxter Hire [phone]) on 07/21/2017 2:13:20 AM         Dejanique Ruehl, Layla Maw, DO 07/21/17 [id number]

## 2017-07-21 NOTE — ED Triage Notes (Signed)
Pt from home via EMS after home health staff found pt unconscious. Upon arrival to home, pt arousable to painful stimuli. Pt alert at this time with good head control, answers appropriately NAD noted, airway patent. 20 G L forearm. EMS VS: 130 palpable BP, 68 bpm, 16 RR, 96% on RA, CBG 98.

## 2017-07-21 NOTE — H&P (Addendum)
[facility] History and Physical  MONT JAGODA ZOX:[id number] DOB: 15-Oct-1932 DOA: 07/21/2017  Referring physician:  PCP: [name], MD   Chief Complaint: Altered mental status  HPI: Dwayne [name] is a 83 y.o. male  with past medical history significant for atrial fibrillation, reflux, hypertension, left ventricular dysfunction, coronary disease, BPH who presented with altered mental status.  She was found unresponsive by his home health nurse. Prior to that patient had been behaving oddly talking about going home when he was started home. Also asking for guns which maybe some so patient might be having suicidal tendencies. Patient's wife just recently passed away in 07/26/23. Son is concern for possible intentional overdose.  ED course: Workup for drug overdose emergency room. No labral abnormalities per EDP. Chest x-ray clear. CT of the head clear. Poison control recommended monitoring the patient for at least 12 hours. C-spine cleared by EDP. Hospitalist consulted for admission.  Review of Systems:  As per HPI otherwise 10 point review of systems negative.    Past Medical History:  Diagnosis Date  . Allergic rhinitis, cause unspecified   . Arthritis   . Atrial fibrillation (HCC)    a. Was on Coumadin until GIB 01/2014.  . Bronchiectasis without acute exacerbation (HCC)   . Coronary artery disease    a. s/p CABG 1993. b. s/p PCI of SVG to OM '03.  . Disorders of diaphragm   . Diverticulosis   . Epistaxis   . GERD (gastroesophageal reflux disease)   . H/O: GI bleed    a. 01/2014 - presumed diverticular. Received PRBC, Vit K. Coumadin put on hold.  . Hyperlipidemia   . Hypertension   . LV dysfunction    a. Echo 08/2013: EF 45-50%.  . Myocardial infarction (HCC)   . Plantar fascial fibromatosis   . Spinal stenosis, unspecified region other than cervical   . Tachy-brady syndrome [facility])    s/p Medtronic PPM '07  . Unspecified sinusitis (chronic)    Past Surgical  History:  Procedure Laterality Date  . CATARACT EXTRACTION W/ INTRAOCULAR LENS  IMPLANT, BILATERAL    . CHOLECYSTECTOMY    . COLONOSCOPY    . CORONARY ARTERY BYPASS GRAFT     LIMA to LAD, SVG to OM, SVG to left circumflex, SVG to PD/PLSA  . DOPPLER ECHOCARDIOGRAPHY  2011  . HIP ARTHROPLASTY Right 12/10/2013   Procedure: Right Hip Cemented Hemiarthroplasty;  Surgeon: Eldred Manges, MD;  Location: [facility] OR;  Service: Orthopedics;  Laterality: Right;  Right Hip Cemented Hemiarthroplasty  . KIDNEY CYST REMOVAL    . PACEMAKER INSERTION     2007, Medtronic dual-chamber  . PERMANENT PACEMAKER GENERATOR CHANGE N/A 08/14/2014   Procedure: PERMANENT PACEMAKER GENERATOR CHANGE;  Surgeon: Marinus Maw, MD;  Location: [facility] CATH LAB;  Service: Cardiovascular;  Laterality: N/A;  . VERTEBROPLASTY     Social History:  reports that he has quit smoking. He has never used smokeless tobacco. He reports that he does not drink alcohol or use drugs.  Allergies  Allergen Reactions  . Asparaginase Derivatives Other (See Comments)    Makes jittery & causes vision problems, headaches  . Doxycycline Hyclate Diarrhea and Nausea And Vomiting  . Erythromycin Diarrhea and Nausea And Vomiting  . Tetracycline Diarrhea and Nausea And Vomiting  . Ambien [Zolpidem Tartrate] Other (See Comments)    unknown  . Caffeine Other (See Comments)    Interrupts sleep  . Clindamycin Other (See Comments)    REACTION: upset  stomach  . Lactose Intolerance (Gi) Other (See Comments)    unknown  . Lyrica [Pregabalin] Other (See Comments)    unknown  . Metronidazole Itching and Rash  . Nitrostat [Nitroglycerin] Diarrhea  . Penicillins Hives    Has patient had a PCN reaction causing immediate rash, facial/tongue/throat swelling, SOB or lightheadedness with hypotension: Yes Has patient had a PCN reaction causing severe rash involving mucus membranes or skin necrosis: No Has patient had a PCN reaction that required hospitalization No Has  patient had a PCN reaction occurring within the last 10 years: No If all of the above answers are "NO", then may proceed with Cephalosporin use.   . Sulfonamide Derivatives Hives    Family History  Problem Relation Age of Onset  . Stroke Mother   . Diabetes Son   . Cancer Son   . Colon cancer Neg Hx   . Heart disease Neg Hx   . Throat cancer Neg Hx   . Pancreatic cancer Neg Hx   . Kidney disease Neg Hx   . Liver disease Neg Hx   . Osteoporosis Neg Hx      Prior to Admission medications   Medication Sig Start Date End Date Taking? Authorizing Provider  atorvastatin (LIPITOR) 40 MG tablet Take 40 mg by mouth daily.   Yes [provider]  Doxepin HCl (SILENOR) 6 MG TABS Take 1 tablet (6 mg total) by mouth at bedtime as needed. Patient taking differently: Take 1 tablet by mouth at bedtime as needed (sleep).  03/02/17  Yes Etta GrandchildJones, Thomas L, MD  omeprazole (PRILOSEC) 40 MG capsule Take 1 capsule (40 mg total) by mouth daily. 01/11/17  Yes Etta GrandchildJones, Thomas L, MD  zolpidem (AMBIEN) 10 MG tablet Take 10 mg by mouth at bedtime.   Yes [provider]  apixaban (ELIQUIS) 5 MG TABS tablet Take 1 tablet (5 mg total) by mouth 2 (two) times daily. 02/16/17   Lewayne Buntingrenshaw, Brian S, MD  Calcium-Magnesium-Vitamin D (CITRACAL CALCIUM+D) 600-40-500 MG-MG-UNIT TB24 Take 2 tablets by mouth daily.     [provider]  cyanocobalamin (,VITAMIN B-12,) 1000 MCG/ML injection Inject 1,000 mcg into the muscle every 21 ( twenty-one) days.    [provider]  fexofenadine (ALLEGRA) 180 MG tablet Take 180 mg by mouth at bedtime as needed (for allergies).     [provider]  finasteride (PROSCAR) 5 MG tablet Take 1 tablet (5 mg total) by mouth at bedtime. 01/15/17   Etta GrandchildJones, Thomas L, MD  GNP MELATONIN PO Take by mouth.    [provider]  metoprolol tartrate (LOPRESSOR) 25 MG tablet Take 1 tablet (25 mg total) by mouth 2 (two) times daily. 03/21/17   Lewayne Buntingrenshaw, Brian S, MD    nitroGLYCERIN (NITROSTAT) 0.4 MG SL tablet Place 1 tablet (0.4 mg total) under the tongue every 5 (five) minutes x 3 doses as needed for chest pain. 01/02/15   Lewayne Buntingrenshaw, Brian S, MD  polyethylene glycol (MIRALAX / GLYCOLAX) packet Take 17 g by mouth daily as needed for moderate constipation.     [provider]  PROLIA 60 MG/ML SOLN injection Inject 60 mg into the skin every 6 (six) months.  10/04/12   [provider]  terazosin (HYTRIN) 5 MG capsule Take 1 capsule (5 mg total) by mouth daily. 01/15/17   Etta GrandchildJones, Thomas L, MD   Physical Exam: Vitals:   07/21/17 0445 07/21/17 0500 07/21/17 0540 07/21/17 0600  BP: (!) 156/74 130/74 131/87 135/85  Pulse: 65  66 82 62  Resp:  17 17 18   Temp:      TempSrc:      SpO2: 99% 100% 100% 90%  Weight:      Height:        Wt Readings from Last 3 Encounters:  07/21/17 67.1 kg (148 lb)  05/26/17 67.4 kg (148 lb 8 oz)  04/07/17 66.2 kg (146 lb)    General:  Appears calm and comfortable, Alert and oriented x 2 Eyes:  PERRL, EOMI, normal lids, iris ENT:  grossly normal hearing, lips & tongue Neck:  no LAD, masses or thyromegaly Cardiovascular:  RRR, no m/r/g. No LE edema.  Respiratory:  CTA bilaterally, no w/r/r. Normal respiratory effort. Abdomen:  soft, ntnd Skin:  no rash or induration seen on limited exam Musculoskeletal:  grossly normal tone BUE/BLE Psychiatric:  grossly normal mood and affect, speech fluent and appropriate Neurologic:  CN 2-12 grossly intact, moves all extremities in coordinated fashion.          Labs on Admission:  Basic Metabolic Panel:  Recent Labs Lab 07/21/17 0112  NA 139  K 3.6  CL 107  CO2 25  GLUCOSE 101*  BUN 13  CREATININE 0.75  CALCIUM 9.0   Liver Function Tests:  Recent Labs Lab 07/21/17 0112  AST 17  ALT 10*  ALKPHOS 59  BILITOT 0.5  PROT 6.1*  ALBUMIN 3.5   No results for input(s): LIPASE, AMYLASE in the last 168 hours. No results for input(s): AMMONIA in the last 168  hours. CBC:  Recent Labs Lab 07/21/17 0112  WBC 9.6  HGB 13.5  HCT 40.5  MCV 95.1  PLT 205   Cardiac Enzymes: No results for input(s): CKTOTAL, CKMB, CKMBINDEX, TROPONINI in the last 168 hours.  BNP (last 3 results) No results for input(s): BNP in the last 8760 hours.  ProBNP (last 3 results) No results for input(s): PROBNP in the last 8760 hours.   Serum creatinine: 0.75 mg/dL 16/10/96 0454 Estimated creatinine clearance: 66.4 mL/min  CBG:  Recent Labs Lab 07/21/17 0112  GLUCAP 91    Radiological Exams on Admission: Ct Head Wo Contrast  Result Date: 07/21/2017 CLINICAL DATA:  83 year old male was found unresponsive. EXAM: CT HEAD WITHOUT CONTRAST CT CERVICAL SPINE WITHOUT CONTRAST TECHNIQUE: Multidetector CT imaging of the head and cervical spine was performed following the standard protocol without intravenous contrast. Multiplanar CT image reconstructions of the cervical spine were also generated. COMPARISON:  Head CT dated 12/07/2013 FINDINGS: CT HEAD FINDINGS Brain: There is moderate age-related atrophy and chronic microvascular ischemic changes. There is no acute intracranial hemorrhage. No mass effect or midline shift noted. No extra-axial fluid collection. Vascular: No hyperdense vessel or unexpected calcification. Skull: Normal. Negative for fracture or focal lesion. Sinuses/Orbits: The visualized paranasal sinuses are clear. Mild left mastoid effusion. The right mastoid air cells are clear. Other: None CT CERVICAL SPINE FINDINGS Alignment: No acute subluxation. Skull base and vertebrae: No acute fracture. There is discontinuity of the posterior ring of C1 which appears chronic and likely related to an old fracture with nonunion or congenital non fusion. Correlation with clinical exam and point tenderness and direct comparison with prior images, if available, recommended. If there is clinical concern for an acute fracture MRI is recommended for further evaluation. Soft  tissues and spinal canal: No prevertebral fluid or swelling. No visible canal hematoma. Disc levels: Multilevel degenerative changes. There is disc space narrowing and vacuum phenomena at C6-C7 and C3-C4. Multilevel facet hypertrophy noted.  Upper chest: Negative. Other: None IMPRESSION: 1. No acute intracranial hemorrhage. Moderate age-related atrophy and chronic microvascular ischemic changes. 2. No acute cervical spine fracture. 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 Clinical correlation is recommended. MRI is recommended if there is high clinical concern for acute fracture. Electronically Signed   By: Elgie CollardArash  Radparvar M.D.   On: 07/21/2017 03:48   Ct Cervical Spine Wo Contrast  Result Date: 07/21/2017 CLINICAL DATA:  83 year old male was found unresponsive. EXAM: CT HEAD WITHOUT CONTRAST CT CERVICAL SPINE WITHOUT CONTRAST TECHNIQUE: Multidetector CT imaging of the head and cervical spine was performed following the standard protocol without intravenous contrast. Multiplanar CT image reconstructions of the cervical spine were also generated. COMPARISON:  Head CT dated 12/07/2013 FINDINGS: CT HEAD FINDINGS Brain: There is moderate age-related atrophy and chronic microvascular ischemic changes. There is no acute intracranial hemorrhage. No mass effect or midline shift noted. No extra-axial fluid collection. Vascular: No hyperdense vessel or unexpected calcification. Skull: Normal. Negative for fracture or focal lesion. Sinuses/Orbits: The visualized paranasal sinuses are clear. Mild left mastoid effusion. The right mastoid air cells are clear. Other: None CT CERVICAL SPINE FINDINGS Alignment: No acute subluxation. Skull base and vertebrae: No acute fracture. There is discontinuity of the posterior ring of C1 which appears chronic and likely related to an old fracture with nonunion or congenital non fusion. Correlation with clinical exam and point tenderness and direct comparison with prior images,  if available, recommended. If there is clinical concern for an acute fracture MRI is recommended for further evaluation. Soft tissues and spinal canal: No prevertebral fluid or swelling. No visible canal hematoma. Disc levels: Multilevel degenerative changes. There is disc space narrowing and vacuum phenomena at C6-C7 and C3-C4. Multilevel facet hypertrophy noted. Upper chest: Negative. Other: None IMPRESSION: 1. No acute intracranial hemorrhage. Moderate age-related atrophy and chronic microvascular ischemic changes. 2. No acute cervical spine fracture. 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 Clinical correlation is recommended. MRI is recommended if there is high clinical concern for acute fracture. Electronically Signed   By: Elgie CollardArash  Radparvar M.D.   On: 07/21/2017 03:48   Dg Chest Portable 1 View  Result Date: 07/21/2017 CLINICAL DATA:  Altered mental status and shortness of breath. EXAM: PORTABLE CHEST 1 VIEW COMPARISON:  04/11/2015 FINDINGS: Cardiac pacemaker. Postoperative changes in the mediastinum. Shallow inspiration with elevation of left hemidiaphragm. This is unchanged since previous study. No airspace disease or consolidation the lungs. Heart size and pulmonary vascularity are normal. Calcification and torsion of the aorta. IMPRESSION: Chronic elevation the left hemidiaphragm. No evidence of active pulmonary disease. Electronically Signed   By: Burman NievesWilliam  Stevens M.D.   On: 07/21/2017 02:14    EKG: Independently reviewed. Ventricular paced rhythm. No STEMI.  Assessment/Plan Principal Problem:   Acute metabolic encephalopathy Active Problems:   Hyperlipidemia with target LDL less than 70   Coronary atherosclerosis of artery bypass graft   Atrial fibrillation (HCC)   BRONCHIECTASIS   Low back pain without sciatica   Essential hypertension, benign   Acute encephalopathy ED workup negative Negative head CT Negative for infection Negative ethanol, acetaminophen,  salicylates, UDS Patient had negative thyroid panel and B12 level earlier in the year We'll monitor patient per poison control recommendation Patient has dementia and lives alone very likely could've had accidental overdose Likely will need placement after admission for safety reasons  Hyperlipidemia Hold Lipitor  Insomnia Hold melatonin, Ambien, doxepin  High blood pressure Hold Lopressor When necessary hydralazine  Atrial  fibrillation Hold Eliquis  BPH Ho Proscar, Hytrin  Reflux Prilosec Hold  Allergies Hold Allegra  Code Status:full  DVT Prophylaxis: scd Family Communication: spoke to son, phone Disposition Plan: Pending Improvement  Status: tele inpt                       Haydee Salter, MD Family Medicine [facility] [email or web address] Password [email or web address]

## 2017-07-22 DIAGNOSIS — G9341 Metabolic encephalopathy: Secondary | ICD-10-CM

## 2017-07-22 DIAGNOSIS — Z87891 Personal history of nicotine dependence: Secondary | ICD-10-CM

## 2017-07-22 LAB — CBC
HEMATOCRIT: 40.2 % (ref 39.0–52.0)
HEMOGLOBIN: 13.5 g/dL (ref 13.0–17.0)
MCH: 31.7 pg (ref 26.0–34.0)
MCHC: 33.6 g/dL (ref 30.0–36.0)
MCV: 94.4 fL (ref 78.0–100.0)
Platelets: 209 10*3/uL (ref 150–400)
RBC: 4.26 MIL/uL (ref 4.22–5.81)
RDW: 14.1 % (ref 11.5–15.5)
WBC: 8.5 10*3/uL (ref 4.0–10.5)

## 2017-07-22 LAB — BASIC METABOLIC PANEL
ANION GAP: 8 (ref 5–15)
BUN: 18 mg/dL (ref 6–20)
CHLORIDE: 107 mmol/L (ref 101–111)
CO2: 24 mmol/L (ref 22–32)
Calcium: 9 mg/dL (ref 8.9–10.3)
Creatinine, Ser: 0.73 mg/dL (ref 0.61–1.24)
GFR calc non Af Amer: >60 mL/min (ref >60)
GLUCOSE: 88 mg/dL (ref 65–99)
POTASSIUM: 3.6 mmol/L (ref 3.5–5.1)
Sodium: 139 mmol/L (ref 135–145)

## 2017-07-22 MED ORDER — ALBUTEROL SULFATE (2.5 MG/3ML) 0.083% IN NEBU
2.5000 mg | INHALATION_SOLUTION | RESPIRATORY_TRACT | Status: DC | PRN
Start: 2017-07-22 — End: 2017-07-24

## 2017-07-22 MED ORDER — METOPROLOL TARTRATE 25 MG PO TABS
25.0000 mg | ORAL_TABLET | Freq: Two times a day (BID) | ORAL | Status: DC
  Administered 2017-07-22 – 2017-07-24 (×5): 25 mg via ORAL
  Filled 2017-07-22 (×5): qty 1

## 2017-07-22 MED ORDER — APIXABAN 5 MG PO TABS
5.0000 mg | ORAL_TABLET | Freq: Two times a day (BID) | ORAL | Status: DC
  Administered 2017-07-22 – 2017-07-24 (×5): 5 mg via ORAL
  Filled 2017-07-22 (×5): qty 1

## 2017-07-22 NOTE — Consult Note (Signed)
   [facility]   07/22/2017  Dola FactorLarry E Azad 07/23/1932 [id number]   Patient screened for potential [facility] Care Management services. [facility] is [facility] services under patient's Medicare  Plan ACO.  Chart reviewed, patient can benefit from psychiatric evaluation and check for progress and disposition needs as appropriate.  Please place a [facility] Care [facility] consult or for questions contact:   Charlesetta ShanksVictoria Henrique Parekh, RN BSN CCM [facility] Liaison  [phone] business mobile phone Toll free office [phone]

## 2017-07-22 NOTE — Progress Notes (Signed)
Patient took scheduled medications of Eliquis and Lopressor.  Neuro check same as 0830 check.  Patient oriented to self only.  He mentioned missing his spouse that died, Dwayne [name].  Support given via active listening.  Patient does not appear agitated at present.

## 2017-07-22 NOTE — Consult Note (Signed)
[facility] Face-to-Face Psychiatry Consult   Reason for Consult:  Intentional OD vs unintentional OD Referring Physician:  Dr. Cena Benton Patient Identification: Dwayne [name] MRN:  [id number] Principal Diagnosis: Acute metabolic encephalopathy Diagnosis:   Patient Active Problem List   Diagnosis Date Noted  . Acute metabolic encephalopathy [G93.41] 07/21/2017  . Acute encephalopathy [G93.40] 07/21/2017  . Gastroesophageal reflux disease without esophagitis [K21.9] 01/10/2017  . Spondylosis of lumbar region without myelopathy or radiculopathy [M47.816] 12/27/2016  . Postural kyphosis of thoracolumbar region [M40.05] 12/27/2016  . Primary osteoarthritis of both knees [M17.0] 12/27/2016  . Primary osteoarthritis of both hands [M19.041, M19.042] 12/27/2016  . Dementia arising in the senium and presenium [F03.90] 05/06/2016  . Routine general medical examination at a health care facility [Z00.00] 03/12/2015  . Insomnia [G47.00] 06/26/2014  . LV dysfunction [I51.9]   . Diverticulosis [K57.90]   . Tachy-brady syndrome (HCC) [I49.5]   . Coronary artery disease [I25.10]   . Anemia, iron deficiency [D50.9] 02/21/2014  . Encounter for therapeutic drug monitoring [Z51.81] 01/17/2014  . Osteoporosis [M81.0] 01/04/2014  . Allergic rhinitis [J30.9] 01/04/2014  . Mild mitral regurgitation [I34.0] 02/13/2013  . Closed unstable burst fracture of first lumbar vertebra with nonunion [S32.012K] 10/18/2012  . Constipation, chronic [K59.09] 05/25/2012  . Hyperglycemia [R73.9] 02/28/2012  . Essential hypertension, benign [I10] 02/22/2012  . ANEMIA, B12 DEFICIENCY [D51.8] 03/03/2011  . Long term current use of anticoagulant [Z79.01] 02/08/2011  . Coronary atherosclerosis of artery bypass graft [I25.810] 08/19/2010  . Cardiac pacemaker in situ [Z95.0] 09/02/2009  . Hyperlipidemia with target LDL less than 70 [E78.5] 10/26/2007  . Atrial fibrillation (HCC) [I48.91] 10/26/2007  . BRONCHIECTASIS [J47.9] 10/26/2007  .  Low back pain without sciatica [M54.5] 10/26/2007    Total Time spent with patient: 30 minutes  Subjective:   Dwayne [name] is a 83 y.o. male patient admitted with acute metabolic encephalopathy.  HPI:  Patient is an 83 y.o. Male that lives alone and his wife died approximately 1.5 weeks ago. It was reported that he unintentionally took too many medications and they are unsure of the exact medication. The patient was asked if it was intentional and had told a son that it was, however patient is disoriented. Patient cannot tell me when his wife died, he said it was almost spring time in 2001 or maybe 2002. He could give me his name, DOB, and said he was at [facility], but not the specific hospital. When I asked patient if he intentionally overdosed he said yes and when asked why he said because my best friend (his wife) died not too long ago. However, when asked where he lived and with whom he stayed with, he stated that he lived with his wife and she takes care of him and he wants to go back home to her. I again asked patient about the overdose, the patient stated that he did not attempt to kill himself and if he was going to do that then he would have just used a gun. He denies any SI/HI/AVH and contracts for safety.   Past Psychiatric History: None reported  Risk to Self: Is patient at risk for suicide?: No Risk to Others:   Prior Inpatient Therapy:   Prior Outpatient Therapy:    Past Medical History:  Past Medical History:  Diagnosis Date  . Allergic rhinitis, cause unspecified   . Arthritis   . Atrial fibrillation (HCC)    a. Was on Coumadin until GIB 01/2014.  . Bronchiectasis without acute  exacerbation ([place])   . Coronary artery disease    a. s/p CABG 1993. b. s/p PCI of SVG to OM '03.  . Dementia 07/21/2017   Per son report  . Disorders of diaphragm   . Diverticulosis   . Epistaxis   . GERD (gastroesophageal reflux disease)   . H/O: GI bleed    a. 01/2014 - presumed  diverticular. Received PRBC, Vit K. Coumadin put on hold.  . Hyperlipidemia   . Hypertension   . LV dysfunction    a. Echo 08/2013: EF 45-50%.  . Myocardial infarction ([place])   . Plantar fascial fibromatosis   . Spinal stenosis, unspecified region other than cervical   . Tachy-brady syndrome [facility])    s/p Medtronic PPM '07  . Unspecified sinusitis (chronic)     Past Surgical History:  Procedure Laterality Date  . CATARACT EXTRACTION W/ INTRAOCULAR LENS  IMPLANT, BILATERAL    . CHOLECYSTECTOMY    . COLONOSCOPY    . CORONARY ARTERY BYPASS GRAFT     LIMA to LAD, SVG to OM, SVG to left circumflex, SVG to PD/PLSA  . DOPPLER ECHOCARDIOGRAPHY  2011  . HIP ARTHROPLASTY Right 12/10/2013   Procedure: Right Hip Cemented Hemiarthroplasty;  Surgeon: Marybelle Killings, MD;  Location: [facility];  Service: Orthopedics;  Laterality: Right;  Right Hip Cemented Hemiarthroplasty  . KIDNEY CYST REMOVAL    . PACEMAKER INSERTION     2007, Medtronic dual-chamber  . PERMANENT PACEMAKER GENERATOR CHANGE N/A 08/14/2014   Procedure: PERMANENT PACEMAKER GENERATOR CHANGE;  Surgeon: Evans Lance, MD;  Location[facility] CATH LAB;  Service: Cardiovascular;  Laterality: N/A;  . VERTEBROPLASTY     Family History:  Family History  Problem Relation Age of Onset  . Stroke Mother   . Diabetes Son   . Cancer Son   . Colon cancer Neg Hx   . Heart disease Neg Hx   . Throat cancer Neg Hx   . Pancreatic cancer Neg Hx   . Kidney disease Neg Hx   . Liver disease Neg Hx   . Osteoporosis Neg Hx    Family Psychiatric  History: None reported Social History:  History  Alcohol Use No    Comment: none     History  Drug Use No    Social History   Social History  . Marital status: Married    Spouse name: Pleas Koch  . Number of children: 4  . Years of education: N/A   Occupational History  .  Retired   Social History Main Topics  . Smoking status: Former Research scientist (life sciences)  . Smokeless tobacco: Never Used     Comment: x 6 months in  high school  . Alcohol use No     Comment: none  . Drug use: No  . Sexual activity: No   Other Topics Concern  . None   Social History Narrative   Married.  Ambulates with a walker.   Additional Social History:    Allergies:   Allergies  Allergen Reactions  . Lactose Intolerance (Gi) Other (See Comments)    Unknown reaction (serious per son)  . Asparaginase Derivatives Other (See Comments)    Makes jittery & causes vision problems, headaches  . Doxycycline Hyclate Diarrhea and Nausea And Vomiting  . Erythromycin Diarrhea and Nausea And Vomiting  . Tetracycline Diarrhea and Nausea And Vomiting  . Ambien [Zolpidem Tartrate] Other (See Comments)    Makes him "mean as a snake"  . Caffeine Other (See Comments)  Interrupts sleep  . Clindamycin Other (See Comments)    REACTION: upset stomach  . Lyrica [Pregabalin] Other (See Comments)    Made him "mean as a snake"  . Metronidazole Itching and Rash  . Nitrostat [Nitroglycerin] Diarrhea  . Penicillins Hives    Has patient had a PCN reaction causing immediate rash, facial/tongue/throat swelling, SOB or lightheadedness with hypotension: Yes Has patient had a PCN reaction causing severe rash involving mucus membranes or skin necrosis: No Has patient had a PCN reaction that required hospitalization No Has patient had a PCN reaction occurring within the last 10 years: No If all of the above answers are "NO", then may proceed with Cephalosporin use.   . Sulfonamide Derivatives Hives    Labs:  Results for orders placed or performed during the hospital encounter of 07/21/17 (from the past 48 hour(s))  Comprehensive metabolic panel     Status: Abnormal   Collection Time: 07/21/17  1:12 AM  Result Value Ref Range   Sodium 139 135 - 145 mmol/L   Potassium 3.6 3.5 - 5.1 mmol/L   Chloride 107 101 - 111 mmol/L   CO2 25 22 - 32 mmol/L   Glucose, Bld 101 (H) 65 - 99 mg/dL   BUN 13 6 - 20 mg/dL   Creatinine, Ser 0.75 0.61 - 1.24 mg/dL    Calcium 9.0 8.9 - 10.3 mg/dL   Total Protein 6.1 (L) 6.5 - 8.1 g/dL   Albumin 3.5 3.5 - 5.0 g/dL   AST 17 15 - 41 U/L   ALT 10 (L) 17 - 63 U/L   Alkaline Phosphatase 59 38 - 126 U/L   Total Bilirubin 0.5 0.3 - 1.2 mg/dL   GFR calc non Af Amer >60 >60 mL/min   GFR calc Af Amer >60 >60 mL/min    Comment: (NOTE) The eGFR has been calculated using the CKD EPI equation. This calculation has not been validated in all clinical situations. eGFR's persistently <60 mL/min signify possible Chronic Kidney Disease.    Anion gap 7 5 - 15  CBC     Status: None   Collection Time: 07/21/17  1:12 AM  Result Value Ref Range   WBC 9.6 4.0 - 10.5 K/uL   RBC 4.26 4.22 - 5.81 MIL/uL   Hemoglobin 13.5 13.0 - 17.0 g/dL   HCT 40.5 39.0 - 52.0 %   MCV 95.1 78.0 - 100.0 fL   MCH 31.7 26.0 - 34.0 pg   MCHC 33.3 30.0 - 36.0 g/dL   RDW 14.1 11.5 - 15.5 %   Platelets 205 150 - 400 K/uL  CBG monitoring, ED     Status: None   Collection Time: 07/21/17  1:12 AM  Result Value Ref Range   Glucose-Capillary 91 65 - 99 mg/dL  Acetaminophen level     Status: Abnormal   Collection Time: 07/21/17  2:15 AM  Result Value Ref Range   Acetaminophen (Tylenol), Serum <10 (L) 10 - 30 ug/mL    Comment:        THERAPEUTIC CONCENTRATIONS VARY SIGNIFICANTLY. A RANGE OF 10-30 ug/mL MAY BE AN EFFECTIVE CONCENTRATION FOR MANY PATIENTS. HOWEVER, SOME ARE BEST TREATED AT CONCENTRATIONS OUTSIDE THIS RANGE. ACETAMINOPHEN CONCENTRATIONS >150 ug/mL AT 4 HOURS AFTER INGESTION AND >50 ug/mL AT 12 HOURS AFTER INGESTION ARE OFTEN ASSOCIATED WITH TOXIC REACTIONS.   Salicylate level     Status: None   Collection Time: 07/21/17  2:15 AM  Result Value Ref Range   Salicylate Lvl <8.2 2.8 -  30.0 mg/dL  Ethanol     Status: None   Collection Time: 07/21/17  2:15 AM  Result Value Ref Range   Alcohol, Ethyl (B) <5 <5 mg/dL    Comment:        LOWEST DETECTABLE LIMIT FOR SERUM ALCOHOL IS 5 mg/dL FOR MEDICAL PURPOSES ONLY    I-stat troponin, ED     Status: None   Collection Time: 07/21/17  2:21 AM  Result Value Ref Range   Troponin i, poc 0.02 0.00 - 0.08 ng/mL   Comment 3            Comment: Due to the release kinetics of cTnI, a negative result within the first hours of the onset of symptoms does not rule out myocardial infarction with certainty. If myocardial infarction is still suspected, repeat the test at appropriate intervals.   Urinalysis, Routine w reflex microscopic     Status: None   Collection Time: 07/21/17  2:55 AM  Result Value Ref Range   Color, Urine YELLOW YELLOW   APPearance CLEAR CLEAR   Specific Gravity, Urine 1.014 1.005 - 1.030   pH 5.0 5.0 - 8.0   Glucose, UA NEGATIVE NEGATIVE mg/dL   Hgb urine dipstick NEGATIVE NEGATIVE   Bilirubin Urine NEGATIVE NEGATIVE   Ketones, ur NEGATIVE NEGATIVE mg/dL   Protein, ur NEGATIVE NEGATIVE mg/dL   Nitrite NEGATIVE NEGATIVE   Leukocytes, UA NEGATIVE NEGATIVE  Rapid urine drug screen (hospital performed)     Status: None   Collection Time: 07/21/17  2:55 AM  Result Value Ref Range   Opiates NONE DETECTED NONE DETECTED   Cocaine NONE DETECTED NONE DETECTED   Benzodiazepines NONE DETECTED NONE DETECTED   Amphetamines NONE DETECTED NONE DETECTED   Tetrahydrocannabinol NONE DETECTED NONE DETECTED   Barbiturates NONE DETECTED NONE DETECTED    Comment:        DRUG SCREEN FOR MEDICAL PURPOSES ONLY.  IF CONFIRMATION IS NEEDED FOR ANY PURPOSE, NOTIFY LAB WITHIN 5 DAYS.        LOWEST DETECTABLE LIMITS FOR URINE DRUG SCREEN Drug Class       Cutoff (ng/mL) Amphetamine      1000 Barbiturate      200 Benzodiazepine   500 Tricyclics       370 Opiates          300 Cocaine          300 THC              50   CK     Status: None   Collection Time: 07/21/17  9:05 AM  Result Value Ref Range   Total CK 83 49 - 397 U/L  Basic metabolic panel     Status: None   Collection Time: 07/22/17  5:02 AM  Result Value Ref Range   Sodium 139 135 - 145  mmol/L   Potassium 3.6 3.5 - 5.1 mmol/L   Chloride 107 101 - 111 mmol/L   CO2 24 22 - 32 mmol/L   Glucose, Bld 88 65 - 99 mg/dL   BUN 18 6 - 20 mg/dL   Creatinine, Ser 0.73 0.61 - 1.24 mg/dL   Calcium 9.0 8.9 - 10.3 mg/dL   GFR calc non Af Amer >60 >60 mL/min   GFR calc Af Amer >60 >60 mL/min    Comment: (NOTE) The eGFR has been calculated using the CKD EPI equation. This calculation has not been validated in all clinical situations. eGFR's persistently <60 mL/min signify possible Chronic  Kidney Disease.    Anion gap 8 5 - 15  CBC     Status: None   Collection Time: 07/22/17  5:02 AM  Result Value Ref Range   WBC 8.5 4.0 - 10.5 K/uL   RBC 4.26 4.22 - 5.81 MIL/uL   Hemoglobin 13.5 13.0 - 17.0 g/dL   HCT 40.2 39.0 - 52.0 %   MCV 94.4 78.0 - 100.0 fL   MCH 31.7 26.0 - 34.0 pg   MCHC 33.6 30.0 - 36.0 g/dL   RDW 14.1 11.5 - 15.5 %   Platelets 209 150 - 400 K/uL   *Note: Due to a large number of results and/or encounters for the requested time period, some results have not been displayed. A complete set of results can be found in Results Review.    Current Facility-Administered Medications  Medication Dose Route Frequency Provider Last Rate Last Dose  . 0.9 %  sodium chloride infusion   Intravenous Continuous Ivor Costa, MD   Stopped at 07/21/17 [phone]  . albuterol (PROVENTIL) (2.5 MG/3ML) 0.083% nebulizer solution 2.5 mg  2.5 mg Nebulization Q2H PRN Elwin Mocha, MD      . apixaban Arne Cleveland) tablet 5 mg  5 mg Oral BID Velvet Bathe, MD   5 mg at 07/22/17 1245  . hydrALAZINE (APRESOLINE) injection 10 mg  10 mg Intravenous Q8H PRN Elwin Mocha, MD      . LORazepam (ATIVAN) tablet 1 mg  1 mg Oral Q8H PRN Elwin Mocha, MD   1 mg at 07/21/17 2215  . metoprolol tartrate (LOPRESSOR) tablet 25 mg  25 mg Oral BID Velvet Bathe, MD   25 mg at 07/22/17 1245  . nitroGLYCERIN (NITROSTAT) SL tablet 0.4 mg  0.4 mg Sublingual Q5 Min x 3 PRN Elwin Mocha, MD      . ondansetron  [facility]) tablet 4 mg  4 mg Oral Q6H PRN Elwin Mocha, MD       Or  . ondansetron [facility]) injection 4 mg  4 mg Intravenous Q6H PRN Elwin Mocha, MD        Musculoskeletal: Strength & Muscle Tone: within normal limits Gait & Station: unsteady Patient leans: N/A  Psychiatric Specialty Exam: Physical Exam  ROS  Blood pressure 125/80, pulse 60, temperature 97.9 F (36.6 C), temperature source Oral, resp. rate 18, height '5\' 6"'$  (1.676 m), weight 66.9 kg (147 lb 8 oz), SpO2 93 %.Body mass index is 23.81 kg/m.  General Appearance: Casual and Fairly Groomed  Eye Contact:  Good  Speech:  Clear and Coherent and Normal Rate  Volume:  Normal  Mood:  Euthymic  Affect:  Appropriate  Thought Process:  Disorganized and Descriptions of Associations: Intact  Orientation:  Other:  Oriented to person and place  Thought Content:  Delusions  Suicidal Thoughts:  Patient denies  Homicidal Thoughts:  Patient denies  Memory:  Immediate;   Poor Recent;   Poor Remote;   Poor  Judgement:  Impaired  Insight:  Lacking  Psychomotor Activity:  Normal  Concentration:  Concentration: Fair and Attention Span: Fair  Recall:  Poor  Fund of Knowledge:  Poor  Language:  Good  Akathisia:  No  Handed:  Right  AIMS (if indicated):     Assets:  Financial Resources/Insurance Housing Social Support  ADL's:  Intact  Cognition:  Impaired,  Mild  Sleep:        Treatment Plan Summary: -Patient should have 24/7 care or sitter with medication monitoring  at home.  -Re-evaluation if patient becomes clearer and more oriented before discharge  Disposition: Patient does not meet criteria for psychiatric inpatient admission. SNF or live in managed care at discharge  Lewis Shock, [place] 07/22/2017 2:32 PM

## 2017-07-22 NOTE — Progress Notes (Signed)
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  Disoriented to situation and date. He was unable to say how he got to current place and was oriented to hospital and that I am his nurse. Patient offered and encouraged to take Ativan 1mg  tablet as ordered prn and he refused to take any medicine.  Sitter at bedside.

## 2017-07-22 NOTE — Plan of Care (Signed)
Problem: Safety: Goal: Ability to remain free from injury will improve Outcome: Progressing Pt will remain with suicide sitter to keep him free from injury.   Problem: Health Behavior/Discharge Planning: Goal: Ability to manage health-related needs will improve Outcome: Not Progressing Pt not self-aware of all health related needs. Pt requires must assistance to in management of health. Will continue to educate pt.   Problem: Pain Managment: Goal: General experience of comfort will improve Outcome: Progressing Pt denies any pain but has exhibited anxiety. Pt becomes confused and then anxious about surrounding. Will continue to treat anxiety with PRN medications per MD orders.   Problem: Skin Integrity: Goal: Risk for impaired skin integrity will decrease Outcome: Progressing Will continue to encourage and enforce turning at least q2 hours.

## 2017-07-22 NOTE — Progress Notes (Addendum)
PROGRESS NOTE    Dwayne [name]  ONG:[id number] DOB: [birth year] DOA: 07/21/2017 PCP: Etta GrandchildJones, Dwayne L, MD    Brief Narrative:  83 y.o. male  with past medical history significant for atrial fibrillation, reflux, hypertension, left ventricular dysfunction, coronary disease, BPH who presented with altered mental status.   Assessment & Plan:   Principal Problem:   Acute metabolic encephalopathy -CT scan of head neck and spine report: No acute intracranial hemorrhage. Moderate age-related atrophy and chronic microvascular ischemic changes. No acute cervical spine fracture.  - Liver enzymes not elevated and with ALT below normal limits. Sodium levels within normal limits. Serum creatinine within normal limits. No leukocytosis. Urinalysis within normal limits. Urine drug screen is negative. Alcohol and salicylate levels within normal limits. Chest x ray not reporting acute pulmonary disease - Given reports of wife dying within last 2 weeks and patient has consented bringing guns over will obtain psychiatry consult. - Patient has dementia and was living by himself it is possible he may have overdosed the question is is whether it was intentional or unintentional. Patient will need ongoing monitoring after discharge, this will have to be discussed with family. - obtain speech therapy evaluation   Active Problems:   Hyperlipidemia with target LDL less than 70 - Currently being held in case patient may have overdosed - Restart next a.m.    Coronary atherosclerosis of artery bypass graft   Atrial fibrillation (HCC) - Medications being held secondary to possible unintentional overdose. Will restart next a.m.    BRONCHIECTASIS   Low back pain without sciatica   Essential hypertension, benign   Acute encephalopathy  As mentioned patient's medication regimen being held in case patient had unintentional overdose. We'll continue home medication regimen next a.m.  DVT prophylaxis: SCD's Code Status:  Full Family Communication: no family at bedside. Son's phone number not listed on emergency contact Disposition Plan: pending work up and psychiatry evaluation   Consultants:   psychiatry   Procedures: None   Antimicrobials: None   Subjective: The patient is still confused. Nursing states that he is talking about things that don't make sense.  Objective: Vitals:   07/21/17 1222 07/21/17 1809 07/21/17 2031 07/22/17 0355  BP: (!) 143/93 (!) 159/114 (!) 147/88 (!) 148/82  Pulse: 71 68 67 94  Resp: (!) 25 (!) 25 20 18   Temp:  97.6 F (36.4 C) 97.8 F (36.6 C) 98.5 F (36.9 C)  TempSrc:  Oral Oral Oral  SpO2: 97% 97% 98% 95%  Weight:  66.9 kg (147 lb 8 oz)    Height:  5\' 6"  (1.676 m)      Intake/Output Summary (Last 24 hours) at 07/22/17 1028 Last data filed at 07/22/17 0507  Gross per 24 hour  Intake              360 ml  Output             1300 ml  Net             -940 ml   Filed Weights   07/21/17 0046 07/21/17 1809  Weight: 67.1 kg (148 lb) 66.9 kg (147 lb 8 oz)    Examination:  General exam: Appears calm and comfortable , In no acute distress, pleasantly confused Respiratory system: Clear to auscultation. Respiratory effort normal. Cardiovascular system: S1 & S2 heard, RRR. No JVD,  rubs, gallops   Gastrointestinal system: Abdomen is nondistended, soft and nontender. No organomegaly or masses felt. No guarding Central nervous system: Alert  and awake. No focal neurological deficits. Extremities: Equal tone, warm Skin: No rashes, lesions or ulcers, and limited exam Psychiatry: Judgement and insight appear impaired. Affect appropriate. Difficult exam secondary to confusion   Data Reviewed: I have personally reviewed following labs and imaging studies  CBC:  Recent Labs Lab 07/21/17 0112 07/22/17 0502  WBC 9.6 8.5  HGB 13.5 13.5  HCT 40.5 40.2  MCV 95.1 94.4  PLT 205 209   Basic Metabolic Panel:  Recent Labs Lab 07/21/17 0112 07/22/17 0502  NA  139 139  K 3.6 3.6  CL 107 107  CO2 25 24  GLUCOSE 101* 88  BUN 13 18  CREATININE 0.75 0.73  CALCIUM 9.0 9.0   GFR: Estimated Creatinine Clearance: 63.1 mL/min (by C-G formula based on SCr of 0.73 mg/dL). Liver Function Tests:  Recent Labs Lab 07/21/17 0112  AST 17  ALT 10*  ALKPHOS 59  BILITOT 0.5  PROT 6.1*  ALBUMIN 3.5   No results for input(s): LIPASE, AMYLASE in the last 168 hours. No results for input(s): AMMONIA in the last 168 hours. Coagulation Profile: No results for input(s): INR, PROTIME in the last 168 hours. Cardiac Enzymes:  Recent Labs Lab 07/21/17 0905  CKTOTAL 83   BNP (last 3 results) No results for input(s): PROBNP in the last 8760 hours. HbA1C: No results for input(s): HGBA1C in the last 72 hours. CBG:  Recent Labs Lab 07/21/17 0112  GLUCAP 91   Lipid Profile: No results for input(s): CHOL, HDL, LDLCALC, TRIG, CHOLHDL, LDLDIRECT in the last 72 hours. Thyroid Function Tests: No results for input(s): TSH, T4TOTAL, FREET4, T3FREE, THYROIDAB in the last 72 hours. Anemia Panel: No results for input(s): VITAMINB12, FOLATE, FERRITIN, TIBC, IRON, RETICCTPCT in the last 72 hours. Sepsis Labs: No results for input(s): PROCALCITON, LATICACIDVEN in the last 168 hours.  No results found for this or any previous visit (from the past 240 hour(s)).       Radiology Studies: Ct Head Wo Contrast  Result Date: 07/21/2017 CLINICAL DATA:  83 year old male was found unresponsive. EXAM: CT HEAD WITHOUT CONTRAST CT CERVICAL SPINE WITHOUT CONTRAST TECHNIQUE: Multidetector CT imaging of the head and cervical spine was performed following the standard protocol without intravenous contrast. Multiplanar CT image reconstructions of the cervical spine were also generated. COMPARISON:  Head CT dated 12/07/2013 FINDINGS: CT HEAD FINDINGS Brain: There is moderate age-related atrophy and chronic microvascular ischemic changes. There is no acute intracranial hemorrhage.  No mass effect or midline shift noted. No extra-axial fluid collection. Vascular: No hyperdense vessel or unexpected calcification. Skull: Normal. Negative for fracture or focal lesion. Sinuses/Orbits: The visualized paranasal sinuses are clear. Mild left mastoid effusion. The right mastoid air cells are clear. Other: None CT CERVICAL SPINE FINDINGS Alignment: No acute subluxation. Skull base and vertebrae: No acute fracture. There is discontinuity of the posterior ring of C1 which appears chronic and likely related to an old fracture with nonunion or congenital non fusion. Correlation with clinical exam and point tenderness and direct comparison with prior images, if available, recommended. If there is clinical concern for an acute fracture MRI is recommended for further evaluation. Soft tissues and spinal canal: No prevertebral fluid or swelling. No visible canal hematoma. Disc levels: Multilevel degenerative changes. There is disc space narrowing and vacuum phenomena at C6-C7 and C3-C4. Multilevel facet hypertrophy noted. Upper chest: Negative. Other: None IMPRESSION: 1. No acute intracranial hemorrhage. Moderate age-related atrophy and chronic microvascular ischemic changes. 2. No acute cervical spine fracture. Old-appearing  fractures of the C1 ring with nonunion. Clinical correlation is recommended. MRI is recommended if there is high clinical concern for acute fracture. Electronically Signed   By: Elgie Collard M.D.   On: 07/21/2017 03:48   Ct Cervical Spine Wo Contrast  Result Date: 07/21/2017 CLINICAL DATA:  83 year old male was found unresponsive. EXAM: CT HEAD WITHOUT CONTRAST CT CERVICAL SPINE WITHOUT CONTRAST TECHNIQUE: Multidetector CT imaging of the head and cervical spine was performed following the standard protocol without intravenous contrast. Multiplanar CT image reconstructions of the cervical spine were also generated. COMPARISON:  Head CT dated 12/07/2013 FINDINGS: CT HEAD FINDINGS Brain:  There is moderate age-related atrophy and chronic microvascular ischemic changes. There is no acute intracranial hemorrhage. No mass effect or midline shift noted. No extra-axial fluid collection. Vascular: No hyperdense vessel or unexpected calcification. Skull: Normal. Negative for fracture or focal lesion. Sinuses/Orbits: The visualized paranasal sinuses are clear. Mild left mastoid effusion. The right mastoid air cells are clear. Other: None CT CERVICAL SPINE FINDINGS Alignment: No acute subluxation. Skull base and vertebrae: No acute fracture. There is discontinuity of the posterior ring of C1 which appears chronic and likely related to an old fracture with nonunion or congenital non fusion. Correlation with clinical exam and point tenderness and direct comparison with prior images, if available, recommended. If there is clinical concern for an acute fracture MRI is recommended for further evaluation. Soft tissues and spinal canal: No prevertebral fluid or swelling. No visible canal hematoma. Disc levels: Multilevel degenerative changes. There is disc space narrowing and vacuum phenomena at C6-C7 and C3-C4. Multilevel facet hypertrophy noted. Upper chest: Negative. Other: None IMPRESSION: 1. No acute intracranial hemorrhage. Moderate age-related atrophy and chronic microvascular ischemic changes. 2. No acute cervical spine fracture. 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 Clinical correlation is recommended. MRI is recommended if there is high clinical concern for acute fracture. Electronically Signed   By: Elgie Collard M.D.   On: 07/21/2017 03:48   Dg Chest Portable 1 View  Result Date: 07/21/2017 CLINICAL DATA:  Altered mental status and shortness of breath. EXAM: PORTABLE CHEST 1 VIEW COMPARISON:  04/11/2015 FINDINGS: Cardiac pacemaker. Postoperative changes in the mediastinum. Shallow inspiration with elevation of left hemidiaphragm. This is unchanged since previous study. No airspace  disease or consolidation the lungs. Heart size and pulmonary vascularity are normal. Calcification and torsion of the aorta. IMPRESSION: Chronic elevation the left hemidiaphragm. No evidence of active pulmonary disease. Electronically Signed   By: Burman Nieves M.D.   On: 07/21/2017 02:14     Scheduled Meds: Continuous Infusions: . sodium chloride Stopped (07/21/17 0855)     LOS: 1 day    Time spent: > 35 minutes  Penny Pia, MD [facility] Pager [phone] If 7PM-7AM, please contact night-coverage [email or web address] Password [email or web address] 07/22/2017, 10:28 AM   Addendum: Spoke to son who states that father admitted to him that he tried to committ suicide. Consulted psychiatry. Son states that patient may have taken sleeping pills and or medication to help urinate. But he did not have access to eliquis or B blocker.   As such will continue B blocker and Eliquis. Psychiatry consult placed.

## 2017-07-22 NOTE — Discharge Instructions (Signed)

## 2017-07-22 NOTE — Evaluation (Signed)
Physical Therapy Evaluation Patient Details Name: Dwayne [name] MRN: [id number] DOB: 13-Jan-1932 Today's Date: 07/22/2017   History of Present Illness  83 yo admitted with encephalopathy and possible overdose. PMHx: Afib, pacemaker, CAD, CABG, HTN  Clinical Impression  Upon arrival pt pleasant and agreeable to mobilize with PT. Pt with confusion, as he states he lives with his wife (who passed away this July) and that his children are still in high school. Pt able to ambulate in hall with RW; however needs continuous VCs to stay inside the RW and requires physical assistance for stability during ambulation. Pt presents with deficits listed in PT problem list below and will benefit from continued acute therapy for safe mobilization, DME training, and strengthening. Recommending SNF due to patient's cognitive and functional deficits.     Follow Up Recommendations SNF;Supervision/Assistance - 24 hour    Equipment Recommendations  Rolling walker with 5" wheels;3in1 (PT)    Recommendations for Other Services OT consult     Precautions / Restrictions Precautions Precautions: Fall      Mobility  Bed Mobility Overal bed mobility: Needs Assistance Bed Mobility: Supine to Sit     Supine to sit: Min guard;HOB elevated     General bed mobility comments: cues for sequence with HOB 30 degrees  Transfers Overall transfer level: Needs assistance   Transfers: Sit to/from Stand Sit to Stand: Min assist         General transfer comment: assist to rise, cues for hand placement, sequence and safety  Ambulation/Gait Ambulation/Gait assistance: Min assist Ambulation Distance (Feet): 150 Feet Assistive device: Rolling walker (2 wheeled) Gait Pattern/deviations: Step-through pattern;Trunk flexed;Narrow base of support   Gait velocity interpretation: Below normal speed for age/gender General Gait Details: cues for safety, RW use and assist to direct RW as well as for balance  Stairs            Wheelchair Mobility    Modified Rankin (Stroke Patients Only)       Balance Overall balance assessment: Needs assistance   Sitting balance-Leahy Scale: Fair       Standing balance-Leahy Scale: Poor                               Pertinent Vitals/Pain Pain Assessment: No/denies pain    Home Living Family/patient expects to be discharged to:: Private residence Living Arrangements: Alone Available Help at Discharge: Family;Available PRN/intermittently Type of Home: House Home Access: Ramped entrance     Home Layout: One level Home Equipment: Walker - 4 wheels      Prior Function Level of Independence: Independent with assistive device(s)               Hand Dominance   Dominant Hand: Right    Extremity/Trunk Assessment   Upper Extremity Assessment Upper Extremity Assessment: Generalized weakness    Lower Extremity Assessment Lower Extremity Assessment: Generalized weakness    Cervical / Trunk Assessment Cervical / Trunk Assessment: Kyphotic  Communication   Communication: HOH  Cognition Arousal/Alertness: Awake/alert Behavior During Therapy: WFL for tasks assessed/performed Overall Cognitive Status: Impaired/Different from baseline Area of Impairment: Orientation;Attention;Memory;Following commands;Safety/judgement                 Orientation Level: Disoriented to;Time;Place;Situation Current Attention Level: Sustained Memory: Decreased short-term memory Following Commands: Follows one step commands consistently Safety/Judgement: Decreased awareness of safety;Decreased awareness of deficits            General Comments  Exercises     Assessment/Plan    PT Assessment Patient needs continued PT services  PT Problem List Decreased strength;Decreased mobility;Decreased safety awareness;Decreased activity tolerance;Decreased cognition;Decreased balance;Decreased knowledge of use of DME       PT Treatment  Interventions Gait training;Therapeutic exercise;Patient/family education;Functional mobility training;DME instruction;Therapeutic activities;Cognitive remediation;Balance training    PT Goals (Current goals can be found in the Care Plan section)  Acute Rehab PT Goals Patient Stated Goal: return home PT Goal Formulation: With patient Time For Goal Achievement: 08/05/17 Potential to Achieve Goals: Fair    Frequency Min 3X/week   Barriers to discharge Decreased caregiver support      Co-evaluation               AM-PAC PT "6 Clicks" Daily Activity  Outcome Measure Difficulty turning over in bed (including adjusting bedclothes, sheets and blankets)?: A Little Difficulty moving from lying on back to sitting on the side of the bed? : A Lot Difficulty sitting down on and standing up from a chair with arms (e.g., wheelchair, bedside commode, etc,.)?: Total Help needed moving to and from a bed to chair (including a wheelchair)?: A Little Help needed walking in hospital room?: A Little Help needed climbing 3-5 steps with a railing? : A Lot 6 Click Score: 14    End of Session Equipment Utilized During Treatment: Gait belt Activity Tolerance: Patient tolerated treatment well Patient left: in chair;with chair alarm set;with call bell/phone within reach;with nursing/sitter in room Nurse Communication: Mobility status;Precautions PT Visit Diagnosis: Other abnormalities of gait and mobility (R26.89);Difficulty in walking, not elsewhere classified (R26.2);Muscle weakness (generalized) (M62.81);Unsteadiness on feet (R26.81)    Time: [id number] PT Time Calculation (min) (ACUTE ONLY): 29 min   Charges:   PT Evaluation $PT Eval Moderate Complexity: 1 Mod PT Treatments $Gait Training: 8-22 mins   PT G CodesArneta Cliche:        Estel Scholze, SPT Acute Rehab [phone]   Arneta ClicheVictoria Jiovanna Frei 07/22/2017, 2:39 PM

## 2017-07-22 NOTE — Evaluation (Signed)
Clinical/Bedside Swallow Evaluation Patient Details  Name: Dwayne [name] MRN: [id number] Date of Birth: 1932-03-12  Today's Date: 07/22/2017 Time: SLP Start Time (ACUTE ONLY): 1501 SLP Stop Time (ACUTE ONLY): 1515 SLP Time Calculation (min) (ACUTE ONLY): 14 min  Past Medical History:  Past Medical History:  Diagnosis Date  . Allergic rhinitis, cause unspecified   . Arthritis   . Atrial fibrillation (HCC)    a. Was on Coumadin until GIB 01/2014.  . Bronchiectasis without acute exacerbation (HCC)   . Coronary artery disease    a. s/p CABG 1993. b. s/p PCI of SVG to OM '03.  . Dementia 07/21/2017   Per son report  . Disorders of diaphragm   . Diverticulosis   . Epistaxis   . GERD (gastroesophageal reflux disease)   . H/O: GI bleed    a. 01/2014 - presumed diverticular. Received PRBC, Vit K. Coumadin put on hold.  . Hyperlipidemia   . Hypertension   . LV dysfunction    a. Echo 08/2013: EF 45-50%.  . Myocardial infarction (HCC)   . Plantar fascial fibromatosis   . Spinal stenosis, unspecified region other than cervical   . Tachy-brady syndrome [facility](HCC)    s/p Medtronic PPM '07  . Unspecified sinusitis (chronic)    Past Surgical History:  Past Surgical History:  Procedure Laterality Date  . CATARACT EXTRACTION W/ INTRAOCULAR LENS  IMPLANT, BILATERAL    . CHOLECYSTECTOMY    . COLONOSCOPY    . CORONARY ARTERY BYPASS GRAFT     LIMA to LAD, SVG to OM, SVG to left circumflex, SVG to PD/PLSA  . DOPPLER ECHOCARDIOGRAPHY  2011  . HIP ARTHROPLASTY Right 12/10/2013   Procedure: Right Hip Cemented Hemiarthroplasty;  Surgeon: Eldred MangesMark C Yates, MD;  [facility];  Service: Orthopedics;  Laterality: Right;  Right Hip Cemented Hemiarthroplasty  . KIDNEY CYST REMOVAL    . PACEMAKER INSERTION     2007, Medtronic dual-chamber  . PERMANENT PACEMAKER GENERATOR CHANGE N/A 08/14/2014   Procedure: PERMANENT PACEMAKER GENERATOR CHANGE;  Surgeon: Marinus MawGregg W Taylor, MD;  [facility];  Service:  Cardiovascular;  Laterality: N/A;  . VERTEBROPLASTY     HPI:  83 yo admitted with encephalopathy and possible overdose. PMHx: Afib, pacemaker, CAD, CABG, HTN, GERD   Assessment / Plan / Recommendation Clinical Impression  Pt appears to have a functional oropharyngeal swallow with his altered mentation being his primary risk for aspiration at this time. No overt s/s of aspiration are observed during self-feeding though and his CXR does not indicate infection. Recommend regular diet and thin liquids with supervision for safety. SLP f/u is not indicated. SLP Visit Diagnosis: Dysphagia, unspecified (R13.10)    Aspiration Risk  Mild aspiration risk    Diet Recommendation Regular;Thin liquid   Liquid Administration via: Cup;Straw Medication Administration: Whole meds with liquid Supervision: Patient able to self feed;Full supervision/cueing for compensatory strategies Compensations: Slow rate;Small sips/bites;Minimize environmental distractions Postural Changes: Seated upright at 90 degrees;Remain upright for at least 30 minutes after po intake    Other  Recommendations Oral Care Recommendations: Oral care BID   Follow up Recommendations 24 hour supervision/assistance      Frequency and Duration            Prognosis        Swallow Study   General HPI: 83 yo admitted with encephalopathy and possible overdose. PMHx: Afib, pacemaker, CAD, CABG, HTN Type of Study: Bedside Swallow Evaluation Previous Swallow Assessment: none in chart Diet Prior  to this Study: NPO Temperature Spikes Noted: No Respiratory Status: Room air History of Recent Intubation: No Behavior/Cognition: Alert;Cooperative;Pleasant mood;Confused Oral Cavity Assessment: Within Functional Limits Oral Care Completed by SLP: No Oral Cavity - Dentition: Adequate natural dentition Vision: Functional for self-feeding Self-Feeding Abilities: Able to feed self Patient Positioning: Upright in chair Baseline Vocal  Quality: Normal Volitional Cough: Strong Volitional Swallow: Able to elicit    Oral/Motor/Sensory Function Overall Oral Motor/Sensory Function: Within functional limits   Ice Chips Ice chips: Not tested   Thin Liquid Thin Liquid: Within functional limits Presentation: Self Fed;Cup;Straw    Nectar Thick Nectar Thick Liquid: Not tested   Honey Thick Honey Thick Liquid: Not tested   Puree Puree: Within functional limits Presentation: Self Fed;Spoon   Solid   GO   Solid: Within functional limits Presentation: Self Georjean ModeFed        Paiewonsky, Alexsus Papadopoulos 07/22/2017,3:27 PM  Maxcine HamLaura Paiewonsky, M.A. CCC-SLP [phone]

## 2017-07-23 NOTE — Progress Notes (Signed)
Patient becoming increasingly agitated and paranoid, says he is "not a nut" and "yall are lying to me".  Keeps asking for nurse to call his wife but then when nurse says she can not he will say that's right I lost my wife back at the end of last month.  Also keeps asking for [name] to come and get him, RN attempted to remind patient that [name] was just here, left at 3 p.m. And told patient he would be staying one more night.    Ativan given po as per prn order.

## 2017-07-23 NOTE — Progress Notes (Signed)
Son [name] in to visit, took patients wallet, change he had in his pants pocket and keys that were in his pants pocket as well.

## 2017-07-23 NOTE — [place] FL2 (Signed)
[place] MEDICAID FL2 LEVEL OF CARE SCREENING TOOL     IDENTIFICATION  Patient Name: Dwayne FactorLarry E Swor Birthdate: October 26, 1932 Sex: male Admission Date (Current Location): 07/21/2017  [facility] [place]   Facility and Address:  [facility] [place], [address], [place], [place] [address]      Provider Number: [id number]  Attending Physician Name and Address:  Penny PiaVega, [place], MD  Relative Name and Phone Number:       Current Level of Care: Hospital Recommended Level of Care: Skilled Nursing Facility Prior Approval Number:    Date Approved/Denied:   PASRR Number: [id number]  Discharge Plan: SNF    Current Diagnoses: Patient Active Problem List   Diagnosis Date Noted  . Acute metabolic encephalopathy 07/21/2017  . Acute encephalopathy 07/21/2017  . Gastroesophageal reflux disease without esophagitis 01/10/2017  . 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 Postural kyphosis of thoracolumbar region 12/27/2016  . Primary osteoarthritis of both knees 12/27/2016  . Primary osteoarthritis of both hands 12/27/2016  . Dementia arising in the senium and presenium 05/06/2016  . Routine general medical examination at a health care facility 03/12/2015  . Insomnia 06/26/2014  . LV dysfunction   . Diverticulosis   . Tachy-brady syndrome (HCC)   . Coronary artery disease   . Anemia, iron deficiency 02/21/2014  . Encounter for therapeutic drug monitoring 01/17/2014  . Osteoporosis 01/04/2014  . Allergic rhinitis 01/04/2014  . Mild mitral regurgitation 02/13/2013  . 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 Constipation, chronic 05/25/2012  . Hyperglycemia 02/28/2012  . Essential hypertension, benign 02/22/2012  . ANEMIA, B12 DEFICIENCY 03/03/2011  . Long term current use of anticoagulant 02/08/2011  . Coronary atherosclerosis of artery bypass graft 08/19/2010  .  Cardiac pacemaker in situ 09/02/2009  . Hyperlipidemia with target LDL less than 70 10/26/2007  . Atrial fibrillation (HCC) 10/26/2007  . BRONCHIECTASIS 10/26/2007  . Low back pain without sciatica 10/26/2007    Orientation RESPIRATION BLADDER Height & Weight     Self  Normal Continent Weight: 147 lb 8 oz (66.9 kg) Height:  5\' 6"  (167.6 cm)  BEHAVIORAL SYMPTOMS/MOOD NEUROLOGICAL BOWEL NUTRITION STATUS      Continent  (Please see d/c summary)  AMBULATORY STATUS COMMUNICATION OF NEEDS Skin   Limited Assist Verbally Normal                       Personal Care Assistance Level of Assistance  Bathing, Feeding, Dressing Bathing Assistance: Limited assistance Feeding assistance: Independent Dressing Assistance: Limited assistance     Functional Limitations Info  Sight, Hearing, Speech Sight Info: Adequate Hearing Info: Adequate Speech Info: Adequate    SPECIAL CARE FACTORS FREQUENCY  PT (By licensed PT), OT (By licensed OT)     PT Frequency: min 3x week OT Frequency: min 3x week            Contractures Contractures Info: Not present    Additional Factors Info  Code Status, Allergies Code Status Info: Full Code Allergies Info: Lactose Intolerance (Gi), Asparaginase Derivatives, Doxycycline Hyclate, Erythromycin, Tetracycline, Ambien Zolpidem Tartrate, Caffeine, Clindamycin, Lyrica Pregabalin, Metronidazole, Nitrostat Nitroglycerin, Penicillins, Sulfonamide Derivatives           Current Medications (07/23/2017):  This is the current hospital active medication list Current Facility-Administered Medications  Medication Dose Route Frequency Provider Last Rate Last Dose  . albuterol (PROVENTIL) (2.5 MG/3ML) 0.083% nebulizer solution 2.5  mg  2.5 mg Nebulization Q2H PRN Haydee SalterHobbs, Phillip M, MD      . apixaban Everlene Balls(ELIQUIS) tablet 5 mg  5 mg Oral BID Penny PiaVega, [place], MD   5 mg at 07/23/17 1058  . hydrALAZINE (APRESOLINE) injection 10 mg  10 mg Intravenous Q8H PRN Haydee SalterHobbs, Phillip M,  MD      . LORazepam (ATIVAN) tablet 1 mg  1 mg Oral Q8H PRN Haydee SalterHobbs, Phillip M, MD   1 mg at 07/21/17 2215  . metoprolol tartrate (LOPRESSOR) tablet 25 mg  25 mg Oral BID Penny PiaVega, [place], MD   25 mg at 07/23/17 1059  . nitroGLYCERIN (NITROSTAT) SL tablet 0.4 mg  0.4 mg Sublingual Q5 Min x 3 PRN Haydee SalterHobbs, Phillip M, MD      . ondansetron [facility](ZOFRAN) tablet 4 mg  4 mg Oral Q6H PRN Haydee SalterHobbs, Phillip M, MD       Or  . ondansetron [facility](ZOFRAN) injection 4 mg  4 mg Intravenous Q6H PRN Haydee SalterHobbs, Phillip M, MD         Discharge Medications: Please see discharge summary for a list of discharge medications.  Relevant Imaging Results:  Relevant Lab Results:   Additional Information SSN: [id number]  Maree KrabbeBridget A Lucill Mauck, LCSW

## 2017-07-23 NOTE — Plan of Care (Signed)
Problem: Safety: Goal: Ability to remain free from injury will improve Outcome: Not Progressing Pt. Requires frequent redirection and reorienting.

## 2017-07-23 NOTE — Clinical Social Work Note (Signed)
PT has recommended SNF at d/c, however sitter must be d/c for 24 hours before placement can occur. CSW continuing to follow.  [place]umberland CenterBridget Zalma [place], [place] [phone]

## 2017-07-23 NOTE — Progress Notes (Signed)
Patient less agitated and paranoid after receiving Ativan, still very confused, continues to ask to leave. Continues to be impulsive, attempting to get up and go find [name] his son.

## 2017-07-23 NOTE — Progress Notes (Signed)
PROGRESS NOTE    Dwayne [name]  ZOX:[id number] DOB: [birth year] DOA: 07/21/2017 PCP: Etta GrandchildJones, Thomas L, MD    Brief Narrative:  83 y.o. male  with past medical history significant for atrial fibrillation, reflux, hypertension, left ventricular dysfunction, coronary disease, BPH who presented with altered mental status.   Assessment & Plan:   Principal Problem:   Acute metabolic encephalopathy -CT scan of head neck and spine report: No acute intracranial hemorrhage. Moderate age-related atrophy and chronic microvascular ischemic changes. No acute cervical spine fracture.  - Liver enzymes not elevated and with ALT below normal limits. Sodium levels within normal limits. Serum creatinine within normal limits. No leukocytosis. Urinalysis within normal limits. Urine drug screen is negative. Alcohol and salicylate levels within normal limits. Chest x ray not reporting acute pulmonary disease - Given reports of wife dying within last 2 weeks and patient has consented bringing guns over will obtain psychiatry consult. - Patient has dementia and was living by himself. It is possible the patient may have taken too much of other home medications. He has been observed for more and 24 hours off of medications which he had access to which were not eliquis or metoprolol. I suspect patient is at baseline. Patient will need placement on discharge as he is not safe to go home given his dementia.  Active Problems:   Hyperlipidemia with target LDL less than 70 - stable no chest pain reported. No statin on MAR review.    Coronary atherosclerosis of artery bypass graft   Atrial fibrillation (HCC) -  continue beta blocker and anticoagulant     BRONCHIECTASIS   Low back pain without sciatica   Essential hypertension, benign   DVT prophylaxis: SCD's Code Status: Full Family Communication: no family at bedside. Son's phone number not listed on emergency contact Disposition Plan:   discussed with psychiatry and  currently does not think patient is suicidal. We do agree that he has dementia and patient will require a safe discharge planning. I do not think he safe to discharge home by himself given his dementia. Patient did not remember whether his wife was alive or not  Consultants:   psychiatry   Procedures: None   Antimicrobials: None   Subjective: The patient is pleasantly confused. He reports no new complaints to me. He did not remember whether his wife was alive or dead. He stated that if he were to go home by himself he would like help   Objective: Vitals:   07/22/17 2100 07/23/17 0555 07/23/17 0801 07/23/17 1059  BP: 131/74 139/67 (!) 157/86 (!) 148/67  Pulse: 61 70 (!) 59 67  Resp: 20 16 20    Temp: (!) 97.5 F (36.4 C) 98 F (36.7 C) 97.8 F (36.6 C)   TempSrc: Oral Oral Oral   SpO2: 95% 98% 97% 94%  Weight:      Height:        Intake/Output Summary (Last 24 hours) at 07/23/17 1447 Last data filed at 07/23/17 1405  Gross per 24 hour  Intake              960 ml  Output             1401 ml  Net             -441 ml   Filed Weights   07/21/17 0046 07/21/17 1809  Weight: 67.1 kg (148 lb) 66.9 kg (147 lb 8 oz)    Examination: Physical exam unchanged when compared 07/22/2017  General exam: Appears calm and comfortable , In no acute distress, pleasantly confused Respiratory system: Clear to auscultation. Respiratory effort normal. Cardiovascular system: S1 & S2 heard, RRR. No JVD,  rubs, gallops   Gastrointestinal system: Abdomen is nondistended, soft and nontender. No organomegaly or masses felt. No guarding Central nervous system: Alert and awake. No focal neurological deficits. Extremities: Equal tone, warm Skin: No rashes, lesions or ulcers, and limited exam Psychiatry: Judgement and insight appear impaired. Affect appropriate. Difficult exam secondary to confusion   Data Reviewed: I have personally reviewed following labs and imaging studies  CBC:  Recent  Labs Lab 07/21/17 0112 07/22/17 0502  WBC 9.6 8.5  HGB 13.5 13.5  HCT 40.5 40.2  MCV 95.1 94.4  PLT 205 209   Basic Metabolic Panel:  Recent Labs Lab 07/21/17 0112 07/22/17 0502  NA 139 139  K 3.6 3.6  CL 107 107  CO2 25 24  GLUCOSE 101* 88  BUN 13 18  CREATININE 0.75 0.73  CALCIUM 9.0 9.0   GFR: Estimated Creatinine Clearance: 63.1 mL/min (by C-G formula based on SCr of 0.73 mg/dL). Liver Function Tests:  Recent Labs Lab 07/21/17 0112  AST 17  ALT 10*  ALKPHOS 59  BILITOT 0.5  PROT 6.1*  ALBUMIN 3.5   No results for input(s): LIPASE, AMYLASE in the last 168 hours. No results for input(s): AMMONIA in the last 168 hours. Coagulation Profile: No results for input(s): INR, PROTIME in the last 168 hours. Cardiac Enzymes:  Recent Labs Lab 07/21/17 0905  CKTOTAL 83   BNP (last 3 results) No results for input(s): PROBNP in the last 8760 hours. HbA1C: No results for input(s): HGBA1C in the last 72 hours. CBG:  Recent Labs Lab 07/21/17 0112  GLUCAP 91   Lipid Profile: No results for input(s): CHOL, HDL, LDLCALC, TRIG, CHOLHDL, LDLDIRECT in the last 72 hours. Thyroid Function Tests: No results for input(s): TSH, T4TOTAL, FREET4, T3FREE, THYROIDAB in the last 72 hours. Anemia Panel: No results for input(s): VITAMINB12, FOLATE, FERRITIN, TIBC, IRON, RETICCTPCT in the last 72 hours. Sepsis Labs: No results for input(s): PROCALCITON, LATICACIDVEN in the last 168 hours.  No results found for this or any previous visit (from the past 240 hour(s)).    Radiology Studies: No results found.   Scheduled Meds: . apixaban  5 mg Oral BID  . metoprolol tartrate  25 mg Oral BID   Continuous Infusions:    LOS: 2 days    Time spent: > 35 minutes  Penny PiaVEGA, Oren Barella, MD [facility] Pager [phone] If 7PM-7AM, please contact night-coverage [email or web address] Password [email or web address] 07/23/2017, 2:47 PM

## 2017-07-24 DIAGNOSIS — Z7901 Long term (current) use of anticoagulants: Secondary | ICD-10-CM | POA: Diagnosis not present

## 2017-07-24 DIAGNOSIS — R1312 Dysphagia, oropharyngeal phase: Secondary | ICD-10-CM | POA: Diagnosis not present

## 2017-07-24 DIAGNOSIS — G9340 Encephalopathy, unspecified: Secondary | ICD-10-CM | POA: Diagnosis not present

## 2017-07-24 DIAGNOSIS — I482 Chronic atrial fibrillation: Secondary | ICD-10-CM | POA: Diagnosis not present

## 2017-07-24 DIAGNOSIS — N400 Enlarged prostate without lower urinary tract symptoms: Secondary | ICD-10-CM | POA: Diagnosis not present

## 2017-07-24 DIAGNOSIS — M545 Low back pain: Secondary | ICD-10-CM | POA: Diagnosis not present

## 2017-07-24 DIAGNOSIS — I25810 Atherosclerosis of coronary artery bypass graft(s) without angina pectoris: Secondary | ICD-10-CM | POA: Diagnosis not present

## 2017-07-24 DIAGNOSIS — M6281 Muscle weakness (generalized): Secondary | ICD-10-CM | POA: Diagnosis not present

## 2017-07-24 DIAGNOSIS — E785 Hyperlipidemia, unspecified: Secondary | ICD-10-CM | POA: Diagnosis not present

## 2017-07-24 DIAGNOSIS — J479 Bronchiectasis, uncomplicated: Secondary | ICD-10-CM | POA: Diagnosis not present

## 2017-07-24 DIAGNOSIS — I10 Essential (primary) hypertension: Secondary | ICD-10-CM | POA: Diagnosis not present

## 2017-07-24 DIAGNOSIS — E538 Deficiency of other specified B group vitamins: Secondary | ICD-10-CM | POA: Diagnosis not present

## 2017-07-24 DIAGNOSIS — R488 Other symbolic dysfunctions: Secondary | ICD-10-CM | POA: Diagnosis not present

## 2017-07-24 DIAGNOSIS — I2510 Atherosclerotic heart disease of native coronary artery without angina pectoris: Secondary | ICD-10-CM | POA: Diagnosis not present

## 2017-07-24 DIAGNOSIS — F0151 Vascular dementia with behavioral disturbance: Secondary | ICD-10-CM | POA: Diagnosis not present

## 2017-07-24 DIAGNOSIS — G9341 Metabolic encephalopathy: Secondary | ICD-10-CM | POA: Diagnosis not present

## 2017-07-24 DIAGNOSIS — N139 Obstructive and reflux uropathy, unspecified: Secondary | ICD-10-CM | POA: Diagnosis not present

## 2017-07-24 DIAGNOSIS — I480 Paroxysmal atrial fibrillation: Secondary | ICD-10-CM | POA: Diagnosis not present

## 2017-07-24 DIAGNOSIS — N401 Enlarged prostate with lower urinary tract symptoms: Secondary | ICD-10-CM | POA: Diagnosis not present

## 2017-07-24 DIAGNOSIS — K219 Gastro-esophageal reflux disease without esophagitis: Secondary | ICD-10-CM | POA: Diagnosis not present

## 2017-07-24 DIAGNOSIS — F0390 Unspecified dementia without behavioral disturbance: Secondary | ICD-10-CM | POA: Diagnosis not present

## 2017-07-24 DIAGNOSIS — R4182 Altered mental status, unspecified: Secondary | ICD-10-CM | POA: Diagnosis not present

## 2017-07-24 DIAGNOSIS — I4891 Unspecified atrial fibrillation: Secondary | ICD-10-CM | POA: Diagnosis not present

## 2017-07-24 DIAGNOSIS — I119 Hypertensive heart disease without heart failure: Secondary | ICD-10-CM | POA: Diagnosis not present

## 2017-07-24 DIAGNOSIS — I110 Hypertensive heart disease with heart failure: Secondary | ICD-10-CM | POA: Diagnosis not present

## 2017-07-24 DIAGNOSIS — I519 Heart disease, unspecified: Secondary | ICD-10-CM | POA: Diagnosis not present

## 2017-07-24 NOTE — Clinical Social Work Note (Signed)
Clinical Social Worker facilitated patient discharge including contacting patient family and facility to confirm patient discharge plans.  Clinical information faxed to facility and family agreeable with plan.  CSW arranged ambulance transport via [facility] to [facility] .  RN to call [phone] for report prior to discharge. Patient will go to room 216.  Clinical Social Worker will sign off for now as social work intervention is no longer needed. Please consult us again if new need arises.  [place]ission[place]ridget Jadien [place], [place] [phone]

## 2017-07-24 NOTE — Progress Notes (Signed)
[facility] to [place] [facility]. [facility] here to transport patient, Clarisse GougeBridget notified so she can let son [name] know.

## 2017-07-24 NOTE — Clinical Social Work Placement (Addendum)
   CLINICAL SOCIAL WORK PLACEMENT  NOTE  Date:  07/24/2017  Patient Details  Name: Dwayne [name] MRN: [id number] Date of Birth: 1932-08-10  Clinical Social Work is seeking post-discharge placement for this patient at the Skilled  Nursing Facility level of care (*CSW will initial, date and re-position this form in  chart as items are completed):      Patient/family provided with [facility] Clinical Social Work Department's list of facilities offering this level of care within the geographic area requested by the patient (or if unable, by the patient's family).  Yes   Patient/family informed of their freedom to choose among providers that offer the needed level of care, that participate in Medicare, Medicaid or managed care program needed by the patient, have an available bed and are willing to accept the patient.      Patient/family informed of [place]'s ownership interest in [facility] and [facility], as well as of the fact that they are under no obligation to receive care at these facilities.  PASRR submitted to EDS on       PASRR number received on 07/23/17     Existing PASRR number confirmed on       FL2 transmitted to all facilities in geographic area requested by pt/family on 07/23/17     FL2 transmitted to all facilities within larger geographic area on       Patient informed that his/her managed care company has contracts with or will negotiate with certain facilities, including the following:        Yes   Patient/family informed of bed offers received.  [facility]     Physician recommends and patient chooses bed at      Patient to be [facility] on 07/24/17.  Patient to be transferred to facility by [facility]     Patient family notified on 07/24/17 of transfer.  Name of family member notified:  Onalee Huaavid     PHYSICIAN Please prepare priority discharge summary, including medications, Please  prepare prescriptions, Please sign FL2     Additional Comment:    _______________________________________________ Maree KrabbeBridget A Shandrell Boda, LCSW 07/24/2017, 9:54 AM

## 2017-07-24 NOTE — Clinical Social Work Note (Signed)
Pt has a bed at [facility] today. CSW will notify son. CSW will set up transport once pt is d/c.   Velora MediateBridget Birdia Jaycox, LCSWA [phone]

## 2017-07-24 NOTE — Discharge Summary (Signed)
Physician Discharge Summary  Dwayne [name] NWG:[id number] DOB: [birth year] DOA: 07/21/2017  PCP: Etta GrandchildJones, Thomas L, MD  Admit date: 07/21/2017 Discharge date: 07/24/2017  Time spent: > 35 minutes  Recommendations for Outpatient Follow-up:  I strongly suspect patient has dementia and will require further workup to figure out which particular type of dementia he may have.  Discharge Diagnoses:  Principal Problem:   Acute metabolic encephalopathy Active Problems:   Hyperlipidemia with target LDL less than 70   Coronary atherosclerosis of artery bypass graft   Atrial fibrillation (HCC)   BRONCHIECTASIS   Low back pain without sciatica   Essential hypertension, benign   Acute encephalopathy   Discharge Condition: Stable  Diet recommendation: Heart healthy  Filed Weights   07/21/17 0046 07/21/17 1809  Weight: 67.1 kg (148 lb) 66.9 kg (147 lb 8 oz)    History of present illness:  83 y.o.malewith past medical history significant for atrial fibrillation, reflux, hypertension, left ventricular dysfunction, coronary disease, BPH who presented with altered mental status.  Presumably secondary to unintentional overdose of home medication regimen.  Hospital Course:  Intentional overdose - Patient was observed off medications for more than 12 hours. Later his atrial fibrillation medication was continued since patient didn't have access to that. - Evaluated by psychiatry who did not feel patient was suicidal  Dementia - I suspect patient may have dementia but I am unable to diagnose which particular 1. He is forgetful and did not remember whether his wife had died even though she had passed away 2 weeks ago.  - I suspect that patient did not take medications like he was supposed to secondary to suspected dementia and as such presented to the hospital. I do not feel the patient is safe to discharge home by himself and as such agree with placement.  For other known medical conditions we'll  continue prior to admission home medication regimen  Procedures:  None  Consultations:  psychiatry  Discharge Exam: Vitals:   07/24/17 0440 07/24/17 0936  BP: 108/61 123/68  Pulse: (!) 58 63  Resp: 16   Temp: 98.1 F (36.7 C)     General: Pt in nad, alert and awake Cardiovascular: rrr, no rubs Respiratory: no increased wob, no wheezes  Discharge Instructions   Discharge Instructions    Call MD for:  extreme fatigue    Complete by:  As directed    Call MD for:  temperature >100.4    Complete by:  As directed    Diet - low sodium heart healthy    Complete by:  As directed    Increase activity slowly    Complete by:  As directed      Current Discharge Medication List    CONTINUE these medications which have NOT CHANGED   Details  apixaban (ELIQUIS) 5 MG TABS tablet Take 1 tablet (5 mg total) by mouth 2 (two) times daily. Qty: 180 tablet, Refills: 3    b complex vitamins tablet Take 1 tablet by mouth daily.    cholecalciferol (VITAMIN D) 1000 units tablet Take 3,000 Units by mouth daily.    cyanocobalamin (,VITAMIN B-12,) 1000 MCG/ML injection Inject 1,000 mcg into the muscle every 30 (thirty) days.     denosumab (PROLIA) 60 MG/ML SOLN injection Inject 60 mg into the skin every 6 (six) months. Administer in upper arm, thigh, or abdomen    Doxepin HCl (SILENOR) 6 MG TABS Take 1 tablet (6 mg total) by mouth at bedtime as needed. Qty: 90 tablet,  Refills: 1   Associated Diagnoses: Primary insomnia    finasteride (PROSCAR) 5 MG tablet Take 1 tablet (5 mg total) by mouth at bedtime. Qty: 90 tablet, Refills: 3   Associated Diagnoses: Benign prostatic hyperplasia with urinary obstruction    metoprolol tartrate (LOPRESSOR) 25 MG tablet Take 1 tablet (25 mg total) by mouth 2 (two) times daily. Qty: 180 tablet, Refills: 3    omeprazole (PRILOSEC) 40 MG capsule Take 1 capsule (40 mg total) by mouth daily. Qty: 90 capsule, Refills: 1   Associated Diagnoses:  Gastroesophageal reflux disease without esophagitis    terazosin (HYTRIN) 5 MG capsule Take 1 capsule (5 mg total) by mouth daily. Qty: 90 capsule, Refills: 3   Associated Diagnoses: Benign prostatic hyperplasia with urinary obstruction    nitroGLYCERIN (NITROSTAT) 0.4 MG SL tablet Place 1 tablet (0.4 mg total) under the tongue every 5 (five) minutes x 3 doses as needed for chest pain. Qty: 25 tablet, Refills: 1       Allergies  Allergen Reactions  . Lactose Intolerance (Gi) Other (See Comments)    Unknown reaction (serious per son)  . Asparaginase Derivatives Other (See Comments)    Makes jittery & causes vision problems, headaches  . Doxycycline Hyclate Diarrhea and Nausea And Vomiting  . Erythromycin Diarrhea and Nausea And Vomiting  . Tetracycline Diarrhea and Nausea And Vomiting  . Ambien [Zolpidem Tartrate] Other (See Comments)    Makes him "mean as a snake"  . Caffeine Other (See Comments)    Interrupts sleep  . Clindamycin Other (See Comments)    REACTION: upset stomach  . Lyrica [Pregabalin] Other (See Comments)    Made him "mean as a snake"  . Metronidazole Itching and Rash  . Nitrostat [Nitroglycerin] Diarrhea  . Penicillins Hives    Has patient had a PCN reaction causing immediate rash, facial/tongue/throat swelling, SOB or lightheadedness with hypotension: Yes Has patient had a PCN reaction causing severe rash involving mucus membranes or skin necrosis: No Has patient had a PCN reaction that required hospitalization No Has patient had a PCN reaction occurring within the last 10 years: No If all of the above answers are "NO", then may proceed with Cephalosporin use.   . Sulfonamide Derivatives Hives   Contact information for after-discharge care    Destination    [facility] .   Specialty:  Skilled Nursing Facility Contact information: [address] [place] [id number] [phone]               The results  of significant diagnostics from this hospitalization (including imaging, microbiology, ancillary and laboratory) are listed below for reference.    Significant Diagnostic Studies: Ct Head Wo Contrast  Result Date: 07/21/2017 CLINICAL DATA:  83 year old male was found unresponsive. EXAM: CT HEAD WITHOUT CONTRAST CT CERVICAL SPINE WITHOUT CONTRAST TECHNIQUE: Multidetector CT imaging of the head and cervical spine was performed following the standard protocol without intravenous contrast. Multiplanar CT image reconstructions of the cervical spine were also generated. COMPARISON:  Head CT dated 12/07/2013 FINDINGS: CT HEAD FINDINGS Brain: There is moderate age-related atrophy and chronic microvascular ischemic changes. There is no acute intracranial hemorrhage. No mass effect or midline shift noted. No extra-axial fluid collection. Vascular: No hyperdense vessel or unexpected calcification. Skull: Normal. Negative for fracture or focal lesion. Sinuses/Orbits: The visualized paranasal sinuses are clear. Mild left mastoid effusion. The right mastoid air cells are clear. Other: None CT CERVICAL SPINE FINDINGS Alignment: No acute subluxation. Skull  base and vertebrae: No acute fracture. There is discontinuity of the posterior ring of C1 which appears chronic and likely related to an old fracture with nonunion or congenital non fusion. Correlation with clinical exam and point tenderness and direct comparison with prior images, if available, recommended. If there is clinical concern for an acute fracture MRI is recommended for further evaluation. Soft tissues and spinal canal: No prevertebral fluid or swelling. No visible canal hematoma. Disc levels: Multilevel degenerative changes. There is disc space narrowing and vacuum phenomena at C6-C7 and C3-C4. Multilevel facet hypertrophy noted. Upper chest: Negative. Other: None IMPRESSION: 1. No acute intracranial hemorrhage. Moderate age-related atrophy and chronic  microvascular ischemic changes. 2. No acute cervical spine fracture. 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 Clinical correlation is recommended. MRI is recommended if there is high clinical concern for acute fracture. Electronically Signed   By: Elgie Collard M.D.   On: 07/21/2017 03:48   Ct Cervical Spine Wo Contrast  Result Date: 07/21/2017 CLINICAL DATA:  83 year old male was found unresponsive. EXAM: CT HEAD WITHOUT CONTRAST CT CERVICAL SPINE WITHOUT CONTRAST TECHNIQUE: Multidetector CT imaging of the head and cervical spine was performed following the standard protocol without intravenous contrast. Multiplanar CT image reconstructions of the cervical spine were also generated. COMPARISON:  Head CT dated 12/07/2013 FINDINGS: CT HEAD FINDINGS Brain: There is moderate age-related atrophy and chronic microvascular ischemic changes. There is no acute intracranial hemorrhage. No mass effect or midline shift noted. No extra-axial fluid collection. Vascular: No hyperdense vessel or unexpected calcification. Skull: Normal. Negative for fracture or focal lesion. Sinuses/Orbits: The visualized paranasal sinuses are clear. Mild left mastoid effusion. The right mastoid air cells are clear. Other: None CT CERVICAL SPINE FINDINGS Alignment: No acute subluxation. Skull base and vertebrae: No acute fracture. There is discontinuity of the posterior ring of C1 which appears chronic and likely related to an old fracture with nonunion or congenital non fusion. Correlation with clinical exam and point tenderness and direct comparison with prior images, if available, recommended. If there is clinical concern for an acute fracture MRI is recommended for further evaluation. Soft tissues and spinal canal: No prevertebral fluid or swelling. No visible canal hematoma. Disc levels: Multilevel degenerative changes. There is disc space narrowing and vacuum phenomena at C6-C7 and C3-C4. Multilevel facet hypertrophy noted.  Upper chest: Negative. Other: None IMPRESSION: 1. No acute intracranial hemorrhage. Moderate age-related atrophy and chronic microvascular ischemic changes. 2. No acute cervical spine fracture. 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 Clinical correlation is recommended. MRI is recommended if there is high clinical concern for acute fracture. Electronically Signed   By: Elgie Collard M.D.   On: 07/21/2017 03:48   Dg Chest Portable 1 View  Result Date: 07/21/2017 CLINICAL DATA:  Altered mental status and shortness of breath. EXAM: PORTABLE CHEST 1 VIEW COMPARISON:  04/11/2015 FINDINGS: Cardiac pacemaker. Postoperative changes in the mediastinum. Shallow inspiration with elevation of left hemidiaphragm. This is unchanged since previous study. No airspace disease or consolidation the lungs. Heart size and pulmonary vascularity are normal. Calcification and torsion of the aorta. IMPRESSION: Chronic elevation the left hemidiaphragm. No evidence of active pulmonary disease. Electronically Signed   By: Burman Nieves M.D.   On: 07/21/2017 02:14    Microbiology: No results found for this or any previous visit (from the past 240 hour(s)).   Labs: Basic Metabolic Panel:  Recent Labs Lab 07/21/17 0112 07/22/17 0502  NA 139 139  K 3.6 3.6  CL 107 107  CO2 25 24  GLUCOSE 101* 88  BUN 13 18  CREATININE 0.75 0.73  CALCIUM 9.0 9.0   Liver Function Tests:  Recent Labs Lab 07/21/17 0112  AST 17  ALT 10*  ALKPHOS 59  BILITOT 0.5  PROT 6.1*  ALBUMIN 3.5   No results for input(s): LIPASE, AMYLASE in the last 168 hours. No results for input(s): AMMONIA in the last 168 hours. CBC:  Recent Labs Lab 07/21/17 0112 07/22/17 0502  WBC 9.6 8.5  HGB 13.5 13.5  HCT 40.5 40.2  MCV 95.1 94.4  PLT 205 209   Cardiac Enzymes:  Recent Labs Lab 07/21/17 0905  CKTOTAL 83   BNP: BNP (last 3 results) No results for input(s): BNP in the last 8760 hours.  ProBNP (last 3  results) No results for input(s): PROBNP in the last 8760 hours.  CBG:  Recent Labs Lab 07/21/17 0112  GLUCAP 91       Signed:  Penny Pia MD.  [facility] 07/24/2017, 11:14 AM

## 2017-07-25 ENCOUNTER — Telehealth: Payer: Self-pay

## 2017-07-25 NOTE — Telephone Encounter (Signed)
Pt dc'ed on 07/24/2017 after admission for altered mental status. Pt has been discharged to a SNF.

## 2017-07-26 ENCOUNTER — Non-Acute Institutional Stay (SKILLED_NURSING_FACILITY): Payer: Medicare Other | Admitting: Internal Medicine

## 2017-07-26 ENCOUNTER — Encounter: Payer: Self-pay | Admitting: Internal Medicine

## 2017-07-26 DIAGNOSIS — F0151 Vascular dementia with behavioral disturbance: Secondary | ICD-10-CM

## 2017-07-26 DIAGNOSIS — G9340 Encephalopathy, unspecified: Secondary | ICD-10-CM | POA: Diagnosis not present

## 2017-07-26 DIAGNOSIS — F01518 Vascular dementia, unspecified severity, with other behavioral disturbance: Secondary | ICD-10-CM

## 2017-07-26 NOTE — Progress Notes (Signed)
NURSING HOME LOCATION:  [facility] ROOM NUMBER:  [id number]  CODE STATUS:  DNR  PCP:  Etta GrandchildJones, Thomas L, MD  [address] [place] [id number]   This is a comprehensive admission note to [facility] performed on this date less than 30 days from date of admission. Included are preadmission medical/surgical history;reconciled medication list; family history; social history and comprehensive review of systems.  Corrections and additions to the records were documented . Comprehensive physical exam was also performed. Additionally a clinical summary was entered for each active diagnosis pertinent to this admission in the Problem List to enhance continuity of care.  HPI: The patient was hospitalized 8/2-07/24/17 with acute metabolic encephalopathy with suspected baseline dementia.  CT scan 8/2 revealed moderate age related atrophy & chronic microvascular ischemic changes. Patient was observed off medications for more than 12 hours  It was questioned that his metabolic encephalopathy might be due to unintentional overdose of home medications.  Memory deficit included not being aware that his wife died approximately 2 weeks prior to his admission.  Psychiatry felt the patient was nonsuicidal; but it was unsafe to return to his home environment and was placed in the SNF for rehabilitation. The last vitamin B12 level on record was low-normal at 448 on . TSH was therapeutic at 1.22 on 01/04/17.  Past medical and surgical history: Includes tachycardia-bradycardia syndrome, atrial fibrillation, GERD, hypertension, left ventricle dysfunction, coronary artery disease, and BPH. He also has a past history of myocardial infarction and bronchiectasis. Surgeries include vertebroplasty, pacemaker insertion, renal cyst removal, cholecystectomy and CABG.  Social history: Smoked only in high school ;nondrinker  Family history: Reviewed  Review of systems:Could not be completed due to  dementia. The patient's answer to every query was negative except for some exertional dyspnea.  After each question he would constantly repeat " I'm in a panic to get here & not charged another night in his hotel".  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   Constitutional: No fever,significant weight change, fatigue  Eyes: No redness, discharge, pain, vision change ENT/mouth: No nasal congestion,  purulent discharge, earache,change in hearing ,sore throat  Cardiovascular: No chest pain, palpitations,paroxysmal nocturnal dyspnea, claudication, edema  Respiratory: No cough, sputum production,hemoptysis, significant snoring,apnea  Gastrointestinal: No heartburn,dysphagia,abdominal pain, nausea / vomiting,rectal bleeding, melena,change in bowels Genitourinary: No dysuria,hematuria, pyuria,  incontinence, nocturia Musculoskeletal: No joint stiffness, joint swelling, weakness,pain Dermatologic: No rash, pruritus, change in appearance of skin Neurologic: No dizziness,headache,syncope, seizures, numbness , tingling Endocrine: No change in hair/skin/ nails, excessive thirst, excessive hunger, excessive urination  Hematologic/lymphatic: No significant bruising, lymphadenopathy,abnormal bleeding Allergy/immunology: No itchy/ watery eyes, significant sneezing, urticaria, angioedema  Physical exam:  Pertinent or positive findings:  SLUMS mental status testing was completed. He scored a 8 out of 30 which indicates dementia  Hair is disheveled. He has pattern baldness. He has a lower partial. Arcus senilis is present. An S4 is noted. One half plus edema is noted at the sock line. He has a healing laceration of the right wrist area with eschar formation. He has scattered small keratoses over the head.  General appearance:Thin but adequately nourished; no acute distress , increased work of breathing is present.   Lymphatic: No lymphadenopathy about the head, neck, axilla  . Eyes: No conjunctival inflammation or lid edema is present. There is no scleral icterus. Ears:  External ear exam shows no significant lesions or deformities.   Nose:  External nasal examination shows no deformity or inflammation. Nasal mucosa are pink and moist without lesions ,exudates Oral exam: lips and gums are healthy appearing.There is no oropharyngeal erythema or exudate . Neck:  No thyromegaly, masses, tenderness noted.    Heart:  Normal rate and regular rhythm. S1 and S2 normal without gallop, murmur, click, rub .  Lungs:Chest clear to auscultation without wheezes, rhonchi,rales , rubs. Abdomen:Bowel sounds are normal. Abdomen is soft and nontender with no organomegaly, hernias,masses. GU: deferred  Extremities:  No cyanosis, clubbing  Neurologic exam : Strength equal  in upper & lower extremities Balance,Rhomberg,finger to nose testing could not be completed due to clinical state Skin: Warm & dry w/o tenting. No significant lesions or rash.  See clinical summary under each active problem in the Problem List with associated updated therapeutic plan

## 2017-07-26 NOTE — Assessment & Plan Note (Addendum)
TSH, VDRL (RPR) , B12 level

## 2017-07-26 NOTE — Assessment & Plan Note (Addendum)
07/26/17 he is agitated about leaving the SNF and scored only 8 out of a score of 30 on the [facility] [employer] (SLUMS) mental testing which is compatible with dementia He is not exhibiting any behavioral disturbance at this time but appears to be at risk for such Check TSH, B12, RPR Lorazepam 0.5 every 8 hours as needed for agitation. Wander guard will be ordered Psych consult will also be requested; atypical antipsychotics may be necessary. Discuss option of Memory Unit SNF with son

## 2017-07-26 NOTE — Patient Instructions (Signed)
See assessment and plan under each diagnosis in the problem list and acutely for this visit 

## 2017-07-27 ENCOUNTER — Ambulatory Visit: Payer: Medicare Other

## 2017-07-27 LAB — TSH: TSH: 1.22 (ref 0.41–5.90)

## 2017-07-27 LAB — VITAMIN B12: Vitamin B-12: 428

## 2017-07-28 ENCOUNTER — Other Ambulatory Visit: Payer: Self-pay | Admitting: *Deleted

## 2017-07-28 NOTE — Patient Outreach (Signed)
  [facility]) Care Management Post-Acute Care Coordination  07/28/2017  Dwayne [name] 08/30/1932 [id number]   Met with [name],  LCSW for [facility]. Reviewed patient case. She confirms that patient will discharge to ALF after rehab stay. No [facility] care management needs identified.  RNCM will sign off case.  [name]. [name], RN, BSN, [place] Post-Acute Care Coordinator [phone]

## 2017-08-11 ENCOUNTER — Non-Acute Institutional Stay (SKILLED_NURSING_FACILITY): Payer: Medicare Other | Admitting: Internal Medicine

## 2017-08-11 ENCOUNTER — Other Ambulatory Visit: Payer: Self-pay | Admitting: Internal Medicine

## 2017-08-11 ENCOUNTER — Encounter: Payer: Self-pay | Admitting: Internal Medicine

## 2017-08-11 DIAGNOSIS — I480 Paroxysmal atrial fibrillation: Secondary | ICD-10-CM

## 2017-08-11 DIAGNOSIS — F01518 Vascular dementia, unspecified severity, with other behavioral disturbance: Secondary | ICD-10-CM

## 2017-08-11 DIAGNOSIS — I10 Essential (primary) hypertension: Secondary | ICD-10-CM | POA: Diagnosis not present

## 2017-08-11 DIAGNOSIS — F0151 Vascular dementia with behavioral disturbance: Secondary | ICD-10-CM

## 2017-08-11 NOTE — Progress Notes (Signed)
NURSING HOME FACILITY:  [facility] ROOM NUMBER:  [id number]  CODE STATUS:  Full Code  PCP:  [name], MD  [address] [place] [address]   The patient is being discharged from [facility] on 08/12/2017 by Marga Melnick MD.  The medical history in this facility was reviewed and summarized and medical problem list was updated. Time spent and note content is documented as follows.  Summary of [facility] medical records: The patient was admitted to the [facility] 07/24/17 after being hospitalized 8/2-8/5 with acute metabolic encephalopathy suprerimposed on baseline dementia. CT scan 8/2 revealed moderate age-related atrophy and chronic microvascular ischemic changes. Initial concern was that he had unintentionally overdosed on his medications. Memory deficit was significant as exhibited by him being unaware that his wife died approximately 2 weeks prior to his admission. Psychiatry felt the patient was non suicidal but unable to return to his home environment unsupervised and was admitted to the [facility] or rehabilitation. Pertinent labs include a therapeutic TSH. B12 level was low normal at 428.Syphilis IgG was negative. Significant history includes tachycardia/bradycardia syndrome, atrial fibrillation, and past history of MI and bronchiectasis. Surgical history includes pacemaker insertion. Admission review of systems could not be completed due to dementia. He repeatedly stated the was " in a panic" to leave the facility as to prevent further expense. 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 SLUMS mental status testing revealed a score of 8 out of 30 indicating dementia. At the [facility] guard was worn.  He is to be discharged to memory care unit at [facility] facility.  Review of systems: Patient denies any active symptoms but his dementia invalidates this response. He was able to give me the year but not the month  or name the president. PT/OT stated that he is mobile with his Rollator w/o gait issues but needs cueing for safety and sequencing functional tasks such as bathing, dressing, and toileting. Speech Therapy recommended reassessment of mild swallowing dysfunction @ new facility.[name] Kitchen He is described as sundowning approximately 3 PM each day.  Physical exam:  Pertinent or positive findings: Pattern alopecia is present. Mild rales are noted posteriorly. He has limb atrophy with weakness to opposition of the legs. He has one half-1+ edema of the lower extremities. Pedal pulses are decreased. There is marked kyphotic curving of the thoracic spine. He has scattered keratoses especially over the scalp.  General appearance:Thin but adequately nourished; no acute distress , increased work of breathing is present.   Lymphatic: No lymphadenopathy about the head, neck, axilla . Eyes: No conjunctival inflammation or lid edema is present. There is no scleral icterus. Ears:  External ear exam shows no significant lesions or deformities.   Nose:  External nasal examination shows no deformity or inflammation. Nasal mucosa are pink and moist without lesions ,exudates Oral exam: lips and gums are healthy appearing.There is no oropharyngeal erythema or exudate . Neck:  No thyromegaly, masses, tenderness noted.    Heart:  Normal rate and regular rhythm. S1 and S2 normal without gallop, murmur, click, rub .  Lungs: without wheezes, rhonchi,rubs. Abdomen:Bowel sounds are normal. Abdomen is soft and nontender with no organomegaly, hernias,masses. GU: deferred as previously addressed. Extremities:  No cyanosis, clubbing  Balance,Rhomberg,finger to nose testing could not be completed due to clinical state Deep tendon reflexes are equal Skin: Warm & dry w/o tenting. No significant lesions or rash.  See clinical summary  of Discharge Diagnoses in the Problem List with associated updated therapeutic plan  Discharge  instructions were written and discharge instructions provided. Follow-up will be by the primary care physician in seven - 14 days.

## 2017-08-11 NOTE — Patient Instructions (Signed)
See Current Assessment & Plan in Problem List under specific Diagnosis 

## 2017-08-11 NOTE — Assessment & Plan Note (Signed)
Prescription written for lorazepam

## 2017-08-11 NOTE — Assessment & Plan Note (Addendum)
Paced rhythm Continue Eliquis prophylaxis

## 2017-08-11 NOTE — Assessment & Plan Note (Signed)
BP controlled; no change in antihypertensive medications  

## 2017-08-12 DIAGNOSIS — F0151 Vascular dementia with behavioral disturbance: Secondary | ICD-10-CM | POA: Diagnosis not present

## 2017-08-12 DIAGNOSIS — N400 Enlarged prostate without lower urinary tract symptoms: Secondary | ICD-10-CM | POA: Diagnosis not present

## 2017-08-12 DIAGNOSIS — I482 Chronic atrial fibrillation: Secondary | ICD-10-CM | POA: Diagnosis not present

## 2017-08-12 DIAGNOSIS — I119 Hypertensive heart disease without heart failure: Secondary | ICD-10-CM | POA: Diagnosis not present

## 2017-08-16 DIAGNOSIS — F0151 Vascular dementia with behavioral disturbance: Secondary | ICD-10-CM | POA: Diagnosis not present

## 2017-08-16 DIAGNOSIS — I119 Hypertensive heart disease without heart failure: Secondary | ICD-10-CM | POA: Diagnosis not present

## 2017-08-16 DIAGNOSIS — I482 Chronic atrial fibrillation: Secondary | ICD-10-CM | POA: Diagnosis not present

## 2017-08-16 DIAGNOSIS — N400 Enlarged prostate without lower urinary tract symptoms: Secondary | ICD-10-CM | POA: Diagnosis not present

## 2017-08-21 DIAGNOSIS — M1990 Unspecified osteoarthritis, unspecified site: Secondary | ICD-10-CM | POA: Diagnosis not present

## 2017-08-21 DIAGNOSIS — I110 Hypertensive heart disease with heart failure: Secondary | ICD-10-CM | POA: Diagnosis not present

## 2017-08-21 DIAGNOSIS — J479 Bronchiectasis, uncomplicated: Secondary | ICD-10-CM | POA: Diagnosis not present

## 2017-08-21 DIAGNOSIS — F0390 Unspecified dementia without behavioral disturbance: Secondary | ICD-10-CM | POA: Diagnosis not present

## 2017-08-21 DIAGNOSIS — I4891 Unspecified atrial fibrillation: Secondary | ICD-10-CM | POA: Diagnosis not present

## 2017-08-21 DIAGNOSIS — I509 Heart failure, unspecified: Secondary | ICD-10-CM | POA: Diagnosis not present

## 2017-08-23 DIAGNOSIS — E538 Deficiency of other specified B group vitamins: Secondary | ICD-10-CM | POA: Diagnosis not present

## 2017-08-23 DIAGNOSIS — I509 Heart failure, unspecified: Secondary | ICD-10-CM | POA: Diagnosis not present

## 2017-08-23 DIAGNOSIS — M1990 Unspecified osteoarthritis, unspecified site: Secondary | ICD-10-CM | POA: Diagnosis not present

## 2017-08-23 DIAGNOSIS — J479 Bronchiectasis, uncomplicated: Secondary | ICD-10-CM | POA: Diagnosis not present

## 2017-08-23 DIAGNOSIS — F0151 Vascular dementia with behavioral disturbance: Secondary | ICD-10-CM | POA: Diagnosis not present

## 2017-08-23 DIAGNOSIS — I110 Hypertensive heart disease with heart failure: Secondary | ICD-10-CM | POA: Diagnosis not present

## 2017-08-23 DIAGNOSIS — F0390 Unspecified dementia without behavioral disturbance: Secondary | ICD-10-CM | POA: Diagnosis not present

## 2017-08-23 DIAGNOSIS — N400 Enlarged prostate without lower urinary tract symptoms: Secondary | ICD-10-CM | POA: Diagnosis not present

## 2017-08-23 DIAGNOSIS — I482 Chronic atrial fibrillation: Secondary | ICD-10-CM | POA: Diagnosis not present

## 2017-08-23 DIAGNOSIS — I4891 Unspecified atrial fibrillation: Secondary | ICD-10-CM | POA: Diagnosis not present

## 2017-08-24 DIAGNOSIS — J479 Bronchiectasis, uncomplicated: Secondary | ICD-10-CM | POA: Diagnosis not present

## 2017-08-24 DIAGNOSIS — I110 Hypertensive heart disease with heart failure: Secondary | ICD-10-CM | POA: Diagnosis not present

## 2017-08-24 DIAGNOSIS — I4891 Unspecified atrial fibrillation: Secondary | ICD-10-CM | POA: Diagnosis not present

## 2017-08-24 DIAGNOSIS — M1990 Unspecified osteoarthritis, unspecified site: Secondary | ICD-10-CM | POA: Diagnosis not present

## 2017-08-24 DIAGNOSIS — F0390 Unspecified dementia without behavioral disturbance: Secondary | ICD-10-CM | POA: Diagnosis not present

## 2017-08-24 DIAGNOSIS — I509 Heart failure, unspecified: Secondary | ICD-10-CM | POA: Diagnosis not present

## 2017-08-29 DIAGNOSIS — J479 Bronchiectasis, uncomplicated: Secondary | ICD-10-CM | POA: Diagnosis not present

## 2017-08-29 DIAGNOSIS — I509 Heart failure, unspecified: Secondary | ICD-10-CM | POA: Diagnosis not present

## 2017-08-29 DIAGNOSIS — I110 Hypertensive heart disease with heart failure: Secondary | ICD-10-CM | POA: Diagnosis not present

## 2017-08-29 DIAGNOSIS — I4891 Unspecified atrial fibrillation: Secondary | ICD-10-CM | POA: Diagnosis not present

## 2017-08-29 DIAGNOSIS — F0390 Unspecified dementia without behavioral disturbance: Secondary | ICD-10-CM | POA: Diagnosis not present

## 2017-08-29 DIAGNOSIS — M1990 Unspecified osteoarthritis, unspecified site: Secondary | ICD-10-CM | POA: Diagnosis not present

## 2017-08-30 DIAGNOSIS — I509 Heart failure, unspecified: Secondary | ICD-10-CM | POA: Diagnosis not present

## 2017-08-30 DIAGNOSIS — M1990 Unspecified osteoarthritis, unspecified site: Secondary | ICD-10-CM | POA: Diagnosis not present

## 2017-08-30 DIAGNOSIS — I4891 Unspecified atrial fibrillation: Secondary | ICD-10-CM | POA: Diagnosis not present

## 2017-08-30 DIAGNOSIS — J479 Bronchiectasis, uncomplicated: Secondary | ICD-10-CM | POA: Diagnosis not present

## 2017-08-30 DIAGNOSIS — F0390 Unspecified dementia without behavioral disturbance: Secondary | ICD-10-CM | POA: Diagnosis not present

## 2017-08-30 DIAGNOSIS — I110 Hypertensive heart disease with heart failure: Secondary | ICD-10-CM | POA: Diagnosis not present

## 2017-09-01 DIAGNOSIS — J479 Bronchiectasis, uncomplicated: Secondary | ICD-10-CM | POA: Diagnosis not present

## 2017-09-01 DIAGNOSIS — F0390 Unspecified dementia without behavioral disturbance: Secondary | ICD-10-CM | POA: Diagnosis not present

## 2017-09-01 DIAGNOSIS — I110 Hypertensive heart disease with heart failure: Secondary | ICD-10-CM | POA: Diagnosis not present

## 2017-09-01 DIAGNOSIS — M79672 Pain in left foot: Secondary | ICD-10-CM | POA: Diagnosis not present

## 2017-09-01 DIAGNOSIS — I509 Heart failure, unspecified: Secondary | ICD-10-CM | POA: Diagnosis not present

## 2017-09-01 DIAGNOSIS — I4891 Unspecified atrial fibrillation: Secondary | ICD-10-CM | POA: Diagnosis not present

## 2017-09-01 DIAGNOSIS — M1990 Unspecified osteoarthritis, unspecified site: Secondary | ICD-10-CM | POA: Diagnosis not present

## 2017-09-05 DIAGNOSIS — J479 Bronchiectasis, uncomplicated: Secondary | ICD-10-CM | POA: Diagnosis not present

## 2017-09-05 DIAGNOSIS — I509 Heart failure, unspecified: Secondary | ICD-10-CM | POA: Diagnosis not present

## 2017-09-05 DIAGNOSIS — M1990 Unspecified osteoarthritis, unspecified site: Secondary | ICD-10-CM | POA: Diagnosis not present

## 2017-09-05 DIAGNOSIS — I4891 Unspecified atrial fibrillation: Secondary | ICD-10-CM | POA: Diagnosis not present

## 2017-09-05 DIAGNOSIS — I110 Hypertensive heart disease with heart failure: Secondary | ICD-10-CM | POA: Diagnosis not present

## 2017-09-05 DIAGNOSIS — F0390 Unspecified dementia without behavioral disturbance: Secondary | ICD-10-CM | POA: Diagnosis not present

## 2017-09-06 DIAGNOSIS — F0151 Vascular dementia with behavioral disturbance: Secondary | ICD-10-CM | POA: Diagnosis not present

## 2017-09-06 DIAGNOSIS — I482 Chronic atrial fibrillation: Secondary | ICD-10-CM | POA: Diagnosis not present

## 2017-09-06 DIAGNOSIS — R600 Localized edema: Secondary | ICD-10-CM | POA: Diagnosis not present

## 2017-09-06 DIAGNOSIS — I119 Hypertensive heart disease without heart failure: Secondary | ICD-10-CM | POA: Diagnosis not present

## 2017-09-07 DIAGNOSIS — I4891 Unspecified atrial fibrillation: Secondary | ICD-10-CM | POA: Diagnosis not present

## 2017-09-07 DIAGNOSIS — F0390 Unspecified dementia without behavioral disturbance: Secondary | ICD-10-CM | POA: Diagnosis not present

## 2017-09-07 DIAGNOSIS — W010XXA Fall on same level from slipping, tripping and stumbling without subsequent striking against object, initial encounter: Secondary | ICD-10-CM | POA: Diagnosis not present

## 2017-09-07 DIAGNOSIS — Z043 Encounter for examination and observation following other accident: Secondary | ICD-10-CM | POA: Diagnosis not present

## 2017-09-07 DIAGNOSIS — G9389 Other specified disorders of brain: Secondary | ICD-10-CM | POA: Diagnosis not present

## 2017-09-07 DIAGNOSIS — Z7982 Long term (current) use of aspirin: Secondary | ICD-10-CM | POA: Diagnosis not present

## 2017-09-07 DIAGNOSIS — F05 Delirium due to known physiological condition: Secondary | ICD-10-CM | POA: Diagnosis not present

## 2017-09-07 DIAGNOSIS — Z87891 Personal history of nicotine dependence: Secondary | ICD-10-CM | POA: Diagnosis not present

## 2017-09-07 DIAGNOSIS — Z888 Allergy status to other drugs, medicaments and biological substances status: Secondary | ICD-10-CM | POA: Diagnosis not present

## 2017-09-07 DIAGNOSIS — I6782 Cerebral ischemia: Secondary | ICD-10-CM | POA: Diagnosis not present

## 2017-09-07 DIAGNOSIS — S0990XA Unspecified injury of head, initial encounter: Secondary | ICD-10-CM | POA: Diagnosis not present

## 2017-09-07 DIAGNOSIS — R404 Transient alteration of awareness: Secondary | ICD-10-CM | POA: Diagnosis not present

## 2017-09-07 DIAGNOSIS — Z880 Allergy status to penicillin: Secondary | ICD-10-CM | POA: Diagnosis not present

## 2017-09-07 DIAGNOSIS — I10 Essential (primary) hypertension: Secondary | ICD-10-CM | POA: Diagnosis not present

## 2017-09-07 DIAGNOSIS — Z7409 Other reduced mobility: Secondary | ICD-10-CM | POA: Diagnosis not present

## 2017-09-07 DIAGNOSIS — R6889 Other general symptoms and signs: Secondary | ICD-10-CM | POA: Diagnosis not present

## 2017-09-08 DIAGNOSIS — I4891 Unspecified atrial fibrillation: Secondary | ICD-10-CM | POA: Diagnosis not present

## 2017-09-08 DIAGNOSIS — M1990 Unspecified osteoarthritis, unspecified site: Secondary | ICD-10-CM | POA: Diagnosis not present

## 2017-09-08 DIAGNOSIS — J479 Bronchiectasis, uncomplicated: Secondary | ICD-10-CM | POA: Diagnosis not present

## 2017-09-08 DIAGNOSIS — I509 Heart failure, unspecified: Secondary | ICD-10-CM | POA: Diagnosis not present

## 2017-09-08 DIAGNOSIS — I110 Hypertensive heart disease with heart failure: Secondary | ICD-10-CM | POA: Diagnosis not present

## 2017-09-08 DIAGNOSIS — F0390 Unspecified dementia without behavioral disturbance: Secondary | ICD-10-CM | POA: Diagnosis not present

## 2017-09-09 DIAGNOSIS — I110 Hypertensive heart disease with heart failure: Secondary | ICD-10-CM | POA: Diagnosis not present

## 2017-09-09 DIAGNOSIS — I509 Heart failure, unspecified: Secondary | ICD-10-CM | POA: Diagnosis not present

## 2017-09-09 DIAGNOSIS — J479 Bronchiectasis, uncomplicated: Secondary | ICD-10-CM | POA: Diagnosis not present

## 2017-09-09 DIAGNOSIS — M1990 Unspecified osteoarthritis, unspecified site: Secondary | ICD-10-CM | POA: Diagnosis not present

## 2017-09-09 DIAGNOSIS — I4891 Unspecified atrial fibrillation: Secondary | ICD-10-CM | POA: Diagnosis not present

## 2017-09-09 DIAGNOSIS — F0390 Unspecified dementia without behavioral disturbance: Secondary | ICD-10-CM | POA: Diagnosis not present

## 2017-09-12 DIAGNOSIS — J479 Bronchiectasis, uncomplicated: Secondary | ICD-10-CM | POA: Diagnosis not present

## 2017-09-12 DIAGNOSIS — M1990 Unspecified osteoarthritis, unspecified site: Secondary | ICD-10-CM | POA: Diagnosis not present

## 2017-09-12 DIAGNOSIS — I4891 Unspecified atrial fibrillation: Secondary | ICD-10-CM | POA: Diagnosis not present

## 2017-09-12 DIAGNOSIS — I509 Heart failure, unspecified: Secondary | ICD-10-CM | POA: Diagnosis not present

## 2017-09-12 DIAGNOSIS — F0390 Unspecified dementia without behavioral disturbance: Secondary | ICD-10-CM | POA: Diagnosis not present

## 2017-09-12 DIAGNOSIS — I110 Hypertensive heart disease with heart failure: Secondary | ICD-10-CM | POA: Diagnosis not present

## 2017-09-13 DIAGNOSIS — I4891 Unspecified atrial fibrillation: Secondary | ICD-10-CM | POA: Diagnosis not present

## 2017-09-13 DIAGNOSIS — J479 Bronchiectasis, uncomplicated: Secondary | ICD-10-CM | POA: Diagnosis not present

## 2017-09-13 DIAGNOSIS — R600 Localized edema: Secondary | ICD-10-CM | POA: Diagnosis not present

## 2017-09-13 DIAGNOSIS — I110 Hypertensive heart disease with heart failure: Secondary | ICD-10-CM | POA: Diagnosis not present

## 2017-09-13 DIAGNOSIS — M1990 Unspecified osteoarthritis, unspecified site: Secondary | ICD-10-CM | POA: Diagnosis not present

## 2017-09-13 DIAGNOSIS — I509 Heart failure, unspecified: Secondary | ICD-10-CM | POA: Diagnosis not present

## 2017-09-13 DIAGNOSIS — F0390 Unspecified dementia without behavioral disturbance: Secondary | ICD-10-CM | POA: Diagnosis not present

## 2017-09-13 DIAGNOSIS — I482 Chronic atrial fibrillation: Secondary | ICD-10-CM | POA: Diagnosis not present

## 2017-09-13 DIAGNOSIS — F0151 Vascular dementia with behavioral disturbance: Secondary | ICD-10-CM | POA: Diagnosis not present

## 2017-09-14 DIAGNOSIS — B351 Tinea unguium: Secondary | ICD-10-CM | POA: Diagnosis not present

## 2017-09-14 DIAGNOSIS — R6889 Other general symptoms and signs: Secondary | ICD-10-CM | POA: Diagnosis not present

## 2017-09-14 DIAGNOSIS — R600 Localized edema: Secondary | ICD-10-CM | POA: Diagnosis not present

## 2017-09-14 DIAGNOSIS — L84 Corns and callosities: Secondary | ICD-10-CM | POA: Diagnosis not present

## 2017-09-14 DIAGNOSIS — I739 Peripheral vascular disease, unspecified: Secondary | ICD-10-CM | POA: Diagnosis not present

## 2017-09-16 DIAGNOSIS — F0390 Unspecified dementia without behavioral disturbance: Secondary | ICD-10-CM | POA: Diagnosis not present

## 2017-09-16 DIAGNOSIS — I4891 Unspecified atrial fibrillation: Secondary | ICD-10-CM | POA: Diagnosis not present

## 2017-09-16 DIAGNOSIS — J479 Bronchiectasis, uncomplicated: Secondary | ICD-10-CM | POA: Diagnosis not present

## 2017-09-16 DIAGNOSIS — M1990 Unspecified osteoarthritis, unspecified site: Secondary | ICD-10-CM | POA: Diagnosis not present

## 2017-09-16 DIAGNOSIS — I509 Heart failure, unspecified: Secondary | ICD-10-CM | POA: Diagnosis not present

## 2017-09-16 DIAGNOSIS — I110 Hypertensive heart disease with heart failure: Secondary | ICD-10-CM | POA: Diagnosis not present

## 2017-09-19 DIAGNOSIS — I110 Hypertensive heart disease with heart failure: Secondary | ICD-10-CM | POA: Diagnosis not present

## 2017-09-19 DIAGNOSIS — J479 Bronchiectasis, uncomplicated: Secondary | ICD-10-CM | POA: Diagnosis not present

## 2017-09-19 DIAGNOSIS — M1990 Unspecified osteoarthritis, unspecified site: Secondary | ICD-10-CM | POA: Diagnosis not present

## 2017-09-19 DIAGNOSIS — F0390 Unspecified dementia without behavioral disturbance: Secondary | ICD-10-CM | POA: Diagnosis not present

## 2017-09-19 DIAGNOSIS — I4891 Unspecified atrial fibrillation: Secondary | ICD-10-CM | POA: Diagnosis not present

## 2017-09-19 DIAGNOSIS — I509 Heart failure, unspecified: Secondary | ICD-10-CM | POA: Diagnosis not present

## 2017-09-20 DIAGNOSIS — I509 Heart failure, unspecified: Secondary | ICD-10-CM | POA: Diagnosis not present

## 2017-09-20 DIAGNOSIS — F0390 Unspecified dementia without behavioral disturbance: Secondary | ICD-10-CM | POA: Diagnosis not present

## 2017-09-20 DIAGNOSIS — I110 Hypertensive heart disease with heart failure: Secondary | ICD-10-CM | POA: Diagnosis not present

## 2017-09-20 DIAGNOSIS — M1990 Unspecified osteoarthritis, unspecified site: Secondary | ICD-10-CM | POA: Diagnosis not present

## 2017-09-20 DIAGNOSIS — I4891 Unspecified atrial fibrillation: Secondary | ICD-10-CM | POA: Diagnosis not present

## 2017-09-20 DIAGNOSIS — J479 Bronchiectasis, uncomplicated: Secondary | ICD-10-CM | POA: Diagnosis not present

## 2017-09-21 DIAGNOSIS — J479 Bronchiectasis, uncomplicated: Secondary | ICD-10-CM | POA: Diagnosis not present

## 2017-09-21 DIAGNOSIS — I4891 Unspecified atrial fibrillation: Secondary | ICD-10-CM | POA: Diagnosis not present

## 2017-09-21 DIAGNOSIS — F0390 Unspecified dementia without behavioral disturbance: Secondary | ICD-10-CM | POA: Diagnosis not present

## 2017-09-21 DIAGNOSIS — M1990 Unspecified osteoarthritis, unspecified site: Secondary | ICD-10-CM | POA: Diagnosis not present

## 2017-09-21 DIAGNOSIS — I509 Heart failure, unspecified: Secondary | ICD-10-CM | POA: Diagnosis not present

## 2017-09-21 DIAGNOSIS — I110 Hypertensive heart disease with heart failure: Secondary | ICD-10-CM | POA: Diagnosis not present

## 2017-09-22 DIAGNOSIS — I509 Heart failure, unspecified: Secondary | ICD-10-CM | POA: Diagnosis not present

## 2017-09-22 DIAGNOSIS — M1990 Unspecified osteoarthritis, unspecified site: Secondary | ICD-10-CM | POA: Diagnosis not present

## 2017-09-22 DIAGNOSIS — I110 Hypertensive heart disease with heart failure: Secondary | ICD-10-CM | POA: Diagnosis not present

## 2017-09-22 DIAGNOSIS — I4891 Unspecified atrial fibrillation: Secondary | ICD-10-CM | POA: Diagnosis not present

## 2017-09-22 DIAGNOSIS — J479 Bronchiectasis, uncomplicated: Secondary | ICD-10-CM | POA: Diagnosis not present

## 2017-09-22 DIAGNOSIS — F0390 Unspecified dementia without behavioral disturbance: Secondary | ICD-10-CM | POA: Diagnosis not present

## 2017-09-27 DIAGNOSIS — F0390 Unspecified dementia without behavioral disturbance: Secondary | ICD-10-CM | POA: Diagnosis not present

## 2017-09-27 DIAGNOSIS — J479 Bronchiectasis, uncomplicated: Secondary | ICD-10-CM | POA: Diagnosis not present

## 2017-09-27 DIAGNOSIS — I509 Heart failure, unspecified: Secondary | ICD-10-CM | POA: Diagnosis not present

## 2017-09-27 DIAGNOSIS — M1990 Unspecified osteoarthritis, unspecified site: Secondary | ICD-10-CM | POA: Diagnosis not present

## 2017-09-27 DIAGNOSIS — I110 Hypertensive heart disease with heart failure: Secondary | ICD-10-CM | POA: Diagnosis not present

## 2017-09-27 DIAGNOSIS — I4891 Unspecified atrial fibrillation: Secondary | ICD-10-CM | POA: Diagnosis not present

## 2017-09-29 DIAGNOSIS — J479 Bronchiectasis, uncomplicated: Secondary | ICD-10-CM | POA: Diagnosis not present

## 2017-09-29 DIAGNOSIS — M1990 Unspecified osteoarthritis, unspecified site: Secondary | ICD-10-CM | POA: Diagnosis not present

## 2017-09-29 DIAGNOSIS — I4891 Unspecified atrial fibrillation: Secondary | ICD-10-CM | POA: Diagnosis not present

## 2017-09-29 DIAGNOSIS — F0390 Unspecified dementia without behavioral disturbance: Secondary | ICD-10-CM | POA: Diagnosis not present

## 2017-09-29 DIAGNOSIS — I509 Heart failure, unspecified: Secondary | ICD-10-CM | POA: Diagnosis not present

## 2017-09-29 DIAGNOSIS — I110 Hypertensive heart disease with heart failure: Secondary | ICD-10-CM | POA: Diagnosis not present

## 2017-10-03 DIAGNOSIS — Z23 Encounter for immunization: Secondary | ICD-10-CM | POA: Diagnosis not present

## 2017-10-04 DIAGNOSIS — I509 Heart failure, unspecified: Secondary | ICD-10-CM | POA: Diagnosis not present

## 2017-10-04 DIAGNOSIS — F0390 Unspecified dementia without behavioral disturbance: Secondary | ICD-10-CM | POA: Diagnosis not present

## 2017-10-04 DIAGNOSIS — I110 Hypertensive heart disease with heart failure: Secondary | ICD-10-CM | POA: Diagnosis not present

## 2017-10-04 DIAGNOSIS — N400 Enlarged prostate without lower urinary tract symptoms: Secondary | ICD-10-CM | POA: Diagnosis not present

## 2017-10-04 DIAGNOSIS — F0151 Vascular dementia with behavioral disturbance: Secondary | ICD-10-CM | POA: Diagnosis not present

## 2017-10-04 DIAGNOSIS — I482 Chronic atrial fibrillation: Secondary | ICD-10-CM | POA: Diagnosis not present

## 2017-10-04 DIAGNOSIS — I4891 Unspecified atrial fibrillation: Secondary | ICD-10-CM | POA: Diagnosis not present

## 2017-10-04 DIAGNOSIS — W1789XA Other fall from one level to another, initial encounter: Secondary | ICD-10-CM | POA: Diagnosis not present

## 2017-10-04 DIAGNOSIS — M1990 Unspecified osteoarthritis, unspecified site: Secondary | ICD-10-CM | POA: Diagnosis not present

## 2017-10-04 DIAGNOSIS — J479 Bronchiectasis, uncomplicated: Secondary | ICD-10-CM | POA: Diagnosis not present

## 2017-10-06 DIAGNOSIS — Z23 Encounter for immunization: Secondary | ICD-10-CM | POA: Diagnosis not present

## 2017-10-06 DIAGNOSIS — I509 Heart failure, unspecified: Secondary | ICD-10-CM | POA: Diagnosis not present

## 2017-10-06 DIAGNOSIS — I4891 Unspecified atrial fibrillation: Secondary | ICD-10-CM | POA: Diagnosis not present

## 2017-10-06 DIAGNOSIS — F0390 Unspecified dementia without behavioral disturbance: Secondary | ICD-10-CM | POA: Diagnosis not present

## 2017-10-06 DIAGNOSIS — M1990 Unspecified osteoarthritis, unspecified site: Secondary | ICD-10-CM | POA: Diagnosis not present

## 2017-10-06 DIAGNOSIS — J479 Bronchiectasis, uncomplicated: Secondary | ICD-10-CM | POA: Diagnosis not present

## 2017-10-06 DIAGNOSIS — I110 Hypertensive heart disease with heart failure: Secondary | ICD-10-CM | POA: Diagnosis not present

## 2017-10-11 DIAGNOSIS — R600 Localized edema: Secondary | ICD-10-CM | POA: Diagnosis not present

## 2017-10-11 DIAGNOSIS — I110 Hypertensive heart disease with heart failure: Secondary | ICD-10-CM | POA: Diagnosis not present

## 2017-10-11 DIAGNOSIS — I4891 Unspecified atrial fibrillation: Secondary | ICD-10-CM | POA: Diagnosis not present

## 2017-10-11 DIAGNOSIS — F0390 Unspecified dementia without behavioral disturbance: Secondary | ICD-10-CM | POA: Diagnosis not present

## 2017-10-11 DIAGNOSIS — F0151 Vascular dementia with behavioral disturbance: Secondary | ICD-10-CM | POA: Diagnosis not present

## 2017-10-11 DIAGNOSIS — J479 Bronchiectasis, uncomplicated: Secondary | ICD-10-CM | POA: Diagnosis not present

## 2017-10-11 DIAGNOSIS — I509 Heart failure, unspecified: Secondary | ICD-10-CM | POA: Diagnosis not present

## 2017-10-11 DIAGNOSIS — I482 Chronic atrial fibrillation: Secondary | ICD-10-CM | POA: Diagnosis not present

## 2017-10-11 DIAGNOSIS — E538 Deficiency of other specified B group vitamins: Secondary | ICD-10-CM | POA: Diagnosis not present

## 2017-10-11 DIAGNOSIS — M1990 Unspecified osteoarthritis, unspecified site: Secondary | ICD-10-CM | POA: Diagnosis not present

## 2017-10-14 DIAGNOSIS — I4891 Unspecified atrial fibrillation: Secondary | ICD-10-CM | POA: Diagnosis not present

## 2017-10-14 DIAGNOSIS — F0390 Unspecified dementia without behavioral disturbance: Secondary | ICD-10-CM | POA: Diagnosis not present

## 2017-10-14 DIAGNOSIS — I110 Hypertensive heart disease with heart failure: Secondary | ICD-10-CM | POA: Diagnosis not present

## 2017-10-14 DIAGNOSIS — I509 Heart failure, unspecified: Secondary | ICD-10-CM | POA: Diagnosis not present

## 2017-10-14 DIAGNOSIS — M1990 Unspecified osteoarthritis, unspecified site: Secondary | ICD-10-CM | POA: Diagnosis not present

## 2017-10-14 DIAGNOSIS — J479 Bronchiectasis, uncomplicated: Secondary | ICD-10-CM | POA: Diagnosis not present

## 2017-10-17 DIAGNOSIS — I4891 Unspecified atrial fibrillation: Secondary | ICD-10-CM | POA: Diagnosis not present

## 2017-10-17 DIAGNOSIS — I110 Hypertensive heart disease with heart failure: Secondary | ICD-10-CM | POA: Diagnosis not present

## 2017-10-17 DIAGNOSIS — D519 Vitamin B12 deficiency anemia, unspecified: Secondary | ICD-10-CM | POA: Diagnosis not present

## 2017-10-17 DIAGNOSIS — M1990 Unspecified osteoarthritis, unspecified site: Secondary | ICD-10-CM | POA: Diagnosis not present

## 2017-10-17 DIAGNOSIS — F0390 Unspecified dementia without behavioral disturbance: Secondary | ICD-10-CM | POA: Diagnosis not present

## 2017-10-17 DIAGNOSIS — Z79899 Other long term (current) drug therapy: Secondary | ICD-10-CM | POA: Diagnosis not present

## 2017-10-17 DIAGNOSIS — J479 Bronchiectasis, uncomplicated: Secondary | ICD-10-CM | POA: Diagnosis not present

## 2017-10-17 DIAGNOSIS — E559 Vitamin D deficiency, unspecified: Secondary | ICD-10-CM | POA: Diagnosis not present

## 2017-10-17 DIAGNOSIS — I509 Heart failure, unspecified: Secondary | ICD-10-CM | POA: Diagnosis not present

## 2017-10-18 DIAGNOSIS — M1990 Unspecified osteoarthritis, unspecified site: Secondary | ICD-10-CM | POA: Diagnosis not present

## 2017-10-18 DIAGNOSIS — I4891 Unspecified atrial fibrillation: Secondary | ICD-10-CM | POA: Diagnosis not present

## 2017-10-18 DIAGNOSIS — I509 Heart failure, unspecified: Secondary | ICD-10-CM | POA: Diagnosis not present

## 2017-10-18 DIAGNOSIS — I110 Hypertensive heart disease with heart failure: Secondary | ICD-10-CM | POA: Diagnosis not present

## 2017-10-18 DIAGNOSIS — J479 Bronchiectasis, uncomplicated: Secondary | ICD-10-CM | POA: Diagnosis not present

## 2017-10-18 DIAGNOSIS — F0390 Unspecified dementia without behavioral disturbance: Secondary | ICD-10-CM | POA: Diagnosis not present

## 2017-10-19 DIAGNOSIS — J479 Bronchiectasis, uncomplicated: Secondary | ICD-10-CM | POA: Diagnosis not present

## 2017-10-19 DIAGNOSIS — I4891 Unspecified atrial fibrillation: Secondary | ICD-10-CM | POA: Diagnosis not present

## 2017-10-19 DIAGNOSIS — F0390 Unspecified dementia without behavioral disturbance: Secondary | ICD-10-CM | POA: Diagnosis not present

## 2017-10-19 DIAGNOSIS — I110 Hypertensive heart disease with heart failure: Secondary | ICD-10-CM | POA: Diagnosis not present

## 2017-10-19 DIAGNOSIS — M1990 Unspecified osteoarthritis, unspecified site: Secondary | ICD-10-CM | POA: Diagnosis not present

## 2017-10-19 DIAGNOSIS — I509 Heart failure, unspecified: Secondary | ICD-10-CM | POA: Diagnosis not present

## 2017-10-20 DIAGNOSIS — I110 Hypertensive heart disease with heart failure: Secondary | ICD-10-CM | POA: Diagnosis not present

## 2017-10-20 DIAGNOSIS — I4891 Unspecified atrial fibrillation: Secondary | ICD-10-CM | POA: Diagnosis not present

## 2017-10-20 DIAGNOSIS — I509 Heart failure, unspecified: Secondary | ICD-10-CM | POA: Diagnosis not present

## 2017-10-20 DIAGNOSIS — J479 Bronchiectasis, uncomplicated: Secondary | ICD-10-CM | POA: Diagnosis not present

## 2017-10-20 DIAGNOSIS — F0390 Unspecified dementia without behavioral disturbance: Secondary | ICD-10-CM | POA: Diagnosis not present

## 2017-10-20 DIAGNOSIS — M1990 Unspecified osteoarthritis, unspecified site: Secondary | ICD-10-CM | POA: Diagnosis not present

## 2017-10-25 DIAGNOSIS — N390 Urinary tract infection, site not specified: Secondary | ICD-10-CM | POA: Diagnosis not present

## 2017-10-25 DIAGNOSIS — I119 Hypertensive heart disease without heart failure: Secondary | ICD-10-CM | POA: Diagnosis not present

## 2017-10-25 DIAGNOSIS — I482 Chronic atrial fibrillation: Secondary | ICD-10-CM | POA: Diagnosis not present

## 2017-10-25 DIAGNOSIS — F0151 Vascular dementia with behavioral disturbance: Secondary | ICD-10-CM | POA: Diagnosis not present

## 2017-10-27 ENCOUNTER — Emergency Department (HOSPITAL_COMMUNITY): Payer: Medicare Other

## 2017-10-27 ENCOUNTER — Encounter (HOSPITAL_COMMUNITY): Payer: Self-pay

## 2017-10-27 ENCOUNTER — Emergency Department (HOSPITAL_COMMUNITY)
Admission: EM | Admit: 2017-10-27 | Discharge: 2017-10-27 | Disposition: A | Discharge status: Home / Self Care | Payer: Medicare Other | Attending: Emergency Medicine | Admitting: Emergency Medicine

## 2017-10-27 DIAGNOSIS — Y92129 Unspecified place in nursing home as the place of occurrence of the external cause: Secondary | ICD-10-CM | POA: Diagnosis not present

## 2017-10-27 DIAGNOSIS — G8911 Acute pain due to trauma: Secondary | ICD-10-CM | POA: Diagnosis not present

## 2017-10-27 DIAGNOSIS — Y999 Unspecified external cause status: Secondary | ICD-10-CM | POA: Diagnosis not present

## 2017-10-27 DIAGNOSIS — S0990XA Unspecified injury of head, initial encounter: Secondary | ICD-10-CM | POA: Insufficient documentation

## 2017-10-27 DIAGNOSIS — W0110XA Fall on same level from slipping, tripping and stumbling with subsequent striking against unspecified object, initial encounter: Secondary | ICD-10-CM | POA: Insufficient documentation

## 2017-10-27 DIAGNOSIS — R4182 Altered mental status, unspecified: Secondary | ICD-10-CM | POA: Diagnosis not present

## 2017-10-27 DIAGNOSIS — I10 Essential (primary) hypertension: Secondary | ICD-10-CM | POA: Insufficient documentation

## 2017-10-27 DIAGNOSIS — Z950 Presence of cardiac pacemaker: Secondary | ICD-10-CM | POA: Insufficient documentation

## 2017-10-27 DIAGNOSIS — R0789 Other chest pain: Secondary | ICD-10-CM | POA: Insufficient documentation

## 2017-10-27 DIAGNOSIS — W19XXXA Unspecified fall, initial encounter: Secondary | ICD-10-CM

## 2017-10-27 DIAGNOSIS — G9340 Encephalopathy, unspecified: Secondary | ICD-10-CM | POA: Diagnosis not present

## 2017-10-27 DIAGNOSIS — Y939 Activity, unspecified: Secondary | ICD-10-CM | POA: Diagnosis not present

## 2017-10-27 DIAGNOSIS — R079 Chest pain, unspecified: Secondary | ICD-10-CM | POA: Diagnosis not present

## 2017-10-27 DIAGNOSIS — R0781 Pleurodynia: Secondary | ICD-10-CM | POA: Diagnosis not present

## 2017-10-27 DIAGNOSIS — M79651 Pain in right thigh: Secondary | ICD-10-CM | POA: Diagnosis not present

## 2017-10-27 DIAGNOSIS — F0390 Unspecified dementia without behavioral disturbance: Secondary | ICD-10-CM | POA: Diagnosis not present

## 2017-10-27 DIAGNOSIS — S199XXA Unspecified injury of neck, initial encounter: Secondary | ICD-10-CM | POA: Diagnosis not present

## 2017-10-27 HISTORY — DX: Insomnia, unspecified: G47.00

## 2017-10-27 HISTORY — DX: Age-related osteoporosis without current pathological fracture: M81.0

## 2017-10-27 HISTORY — DX: Dorsalgia, unspecified: M54.9

## 2017-10-27 NOTE — ED Triage Notes (Signed)
Per [facility], Pt had unwitnessed fall [place] [facility] where [place] his head. No LOC and no Blood Thinners that are known. Pt reports Left rib pain, tenderness upon palpation, and right leg. Pt is Alert, but confused to situation at baseline. HX of MI, HTN. Vitals per EMS: 130/98, 89 HR, 16 RR, 96 % on RA

## 2017-10-27 NOTE — ED Notes (Signed)
Pt taken to Xray.

## 2017-10-27 NOTE — ED Notes (Addendum)
Pt to be d/c back to [facility] via [facility]tar. VSS, NAD. Pt assisted to the restroom. Pt needed maximum assist to stand and transfer onto the toilet. Tolerated well.

## 2017-10-27 NOTE — ED Provider Notes (Signed)
[facility]   CSN: [id number] Arrival date & time: 10/27/17  [id number]     History   Chief Complaint Chief Complaint  Patient presents with  . Fall    HPI Dwayne [name] is a 83 y.o. male.  The history is provided by the patient, the EMS personnel and medical records. No language interpreter was used.  Fall    Dwayne [name] is a 83 y.o. male  with a PMH as listed below who presents to the Emergency Department for evaluation following unwitnessed fall at nursing facility. Initially complained of left rib cage pain. Facility believes he struck his head. Med list in ER shows Eliquis. Home med list not available at time of evaluation. Patient unable to contribute to history, but denies any complaints at this time. Baseline mental status is alert to self only per facility.  Level V caveat applies 2/2 dementia.   Past Medical History:  Diagnosis Date  . Allergic rhinitis, cause unspecified   . Arthritis   . Atrial fibrillation (HCC)    a. Was on Coumadin until GIB 01/2014.  . Bronchiectasis without acute exacerbation (HCC)   . Coronary artery disease    a. s/p CABG 1993. b. s/p PCI of SVG to OM '03.  . Dementia 07/21/2017   Per son report  . Disorders of diaphragm   . Diverticulosis   . Dorsalgia   . Epistaxis   . GERD (gastroesophageal reflux disease)   . H/O: GI bleed    a. 01/2014 - presumed diverticular. Received PRBC, Vit K. Coumadin put on hold.  . Hyperlipidemia   . Hypertension   . Insomnia   . LV dysfunction    a. Echo 08/2013: EF 45-50%.  . Myocardial infarction (HCC)   . Osteoporosis   . Plantar fascial fibromatosis   . Spinal stenosis, unspecified region other than cervical   . Tachy-brady syndrome [facility](HCC)    s/p Medtronic PPM '07  . Unspecified sinusitis (chronic)     Patient Active Problem List   Diagnosis Date Noted  . Acute metabolic encephalopathy 07/21/2017  . Acute encephalopathy 07/21/2017  .  Gastroesophageal reflux disease without esophagitis 01/10/2017  . 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 Postural kyphosis of thoracolumbar region 12/27/2016  . Primary osteoarthritis of both knees 12/27/2016  . Primary osteoarthritis of both hands 12/27/2016  . Dementia 05/06/2016  . Routine general medical examination at a health care facility 03/12/2015  . Insomnia 06/26/2014  . LV dysfunction   . Diverticulosis   . Tachy-brady syndrome (HCC)   . Coronary artery disease   . Anemia, iron deficiency 02/21/2014  . Encounter for therapeutic drug monitoring 01/17/2014  . Osteoporosis 01/04/2014  . Allergic rhinitis 01/04/2014  . Mild mitral regurgitation 02/13/2013  . 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 Constipation, chronic 05/25/2012  . Hyperglycemia 02/28/2012  . Essential hypertension, benign 02/22/2012  . ANEMIA, B12 DEFICIENCY 03/03/2011  . Long term current use of anticoagulant 02/08/2011  . Coronary atherosclerosis of artery bypass graft 08/19/2010  . Cardiac pacemaker in situ 09/02/2009  . Hyperlipidemia with target LDL less than 70 10/26/2007  . Atrial fibrillation (HCC) 10/26/2007  . BRONCHIECTASIS 10/26/2007  . Low back pain without sciatica 10/26/2007    Past Surgical History:  Procedure Laterality Date  . CATARACT EXTRACTION W/ INTRAOCULAR LENS  IMPLANT, BILATERAL    . CHOLECYSTECTOMY    . COLONOSCOPY    .  CORONARY ARTERY BYPASS GRAFT     LIMA to LAD, SVG to OM, SVG to left circumflex, SVG to PD/PLSA  . DOPPLER ECHOCARDIOGRAPHY  2011  . KIDNEY CYST REMOVAL    . PACEMAKER INSERTION     2007, Medtronic dual-chamber  . VERTEBROPLASTY         Home Medications    Prior to Admission medications   Medication Sig Start Date End Date Taking? Authorizing Provider  apixaban (ELIQUIS) 5 MG TABS tablet Take 1 tablet (5 mg total) by mouth 2 (two) times daily. 02/16/17   Lewayne Buntingrenshaw,  Brian S, MD  b complex vitamins tablet Take 1 tablet by mouth daily.    [provider]  cholecalciferol (VITAMIN D) 1000 units tablet Take 3,000 Units by mouth daily.    [provider]  cyanocobalamin (,VITAMIN B-12,) 1000 MCG/ML injection Inject 1,000 mcg into the muscle every 30 (thirty) days.     [provider]  denosumab (PROLIA) 60 MG/ML SOLN injection Inject 60 mg into the skin every 6 (six) months. Administer in upper arm, thigh, or abdomen    [provider]  Doxepin HCl (SILENOR) 6 MG TABS Take 1 tablet (6 mg total) by mouth at bedtime as needed. 03/02/17   Etta GrandchildJones, Thomas L, MD  finasteride (PROSCAR) 5 MG tablet Take 1 tablet (5 mg total) by mouth at bedtime. 01/15/17   Etta GrandchildJones, Thomas L, MD  LORazepam (ATIVAN) 0.5 MG tablet Take 0.5 mg by mouth every 8 (eight) hours as needed for anxiety (Behavior disturbances).    [provider]  metoprolol tartrate (LOPRESSOR) 25 MG tablet Take 1 tablet (25 mg total) by mouth 2 (two) times daily. 03/21/17   Lewayne Buntingrenshaw, Brian S, MD  nitroGLYCERIN (NITROSTAT) 0.4 MG SL tablet Place 1 tablet (0.4 mg total) under the tongue every 5 (five) minutes x 3 doses as needed for chest pain. 01/02/15   Lewayne Buntingrenshaw, Brian S, MD  omeprazole (PRILOSEC) 40 MG capsule Take 1 capsule (40 mg total) by mouth daily. 01/11/17   Etta GrandchildJones, Thomas L, MD  terazosin (HYTRIN) 5 MG capsule Take 1 capsule (5 mg total) by mouth daily. 01/15/17   Etta GrandchildJones, Thomas L, MD    Family History Family History  Problem Relation Age of Onset  . Stroke Mother   . Diabetes Son   . Cancer Son   . Colon cancer Neg Hx   . Heart disease Neg Hx   . Throat cancer Neg Hx   . Pancreatic cancer Neg Hx   . Kidney disease Neg Hx   . Liver disease Neg Hx   . Osteoporosis Neg Hx     Social History Social History   Tobacco Use  . Smoking status: Former Games developermoker  . Smokeless tobacco: Never Used  . Tobacco comment: x 6 months in high school  Substance Use Topics  .  Alcohol use: No    Comment: none  . Drug use: No     Allergies   Lactose intolerance (gi); Asparaginase derivatives; Doxycycline hyclate; Erythromycin; Tetracycline; Amprenavir; Coumadin [warfarin sodium]; Gabapentin; Ambien [zolpidem tartrate]; Caffeine; Clindamycin; Lyrica [pregabalin]; Metronidazole; Nitrostat [nitroglycerin]; Penicillins; and Sulfonamide derivatives   Review of Systems Review of Systems  Unable to perform ROS: Dementia     Physical Exam Updated Vital Signs BP (!) 155/77 (BP Location: Left Arm)   Pulse 72   Temp (!) 97.1 F (36.2 C) (Oral)   Resp 15   Ht 6' (1.829 m)   Wt 72.6 kg (160 lb)  SpO2 100%   BMI 21.70 kg/m   Physical Exam  Constitutional: He appears well-developed and well-nourished. No distress.  HENT:  Head: Normocephalic and atraumatic.  Cardiovascular: Normal rate.  No murmur heard. Pulmonary/Chest: Effort normal and breath sounds normal. No stridor. No respiratory distress. He has no wheezes. He has no rales. He exhibits no tenderness.  Abdominal: Soft. He exhibits no distension. There is no tenderness.  Musculoskeletal: He exhibits no edema.  No midline C/T/L spine or paraspinal tenderness. Strength and sensation intact in all four extremities. No tenderness to rib cage area or extremities.  Neurological:  Speech clear. CN 2-12 grossly intact. No drift. Alert to self only which is baseline per facility.  Skin: Skin is warm and dry.  No open wounds. No bruising noted to rib cage area.   Nursing note and vitals reviewed.    ED Treatments / Results  Labs (all labs ordered are listed, but only abnormal results are displayed) Labs Reviewed - No data to display  EKG  EKG Interpretation None       Radiology Dg Chest 2 View  Result Date: 10/27/2017 CLINICAL DATA:  Chest pain, fell today, confused a little EXAM: CHEST  2 VIEW COMPARISON:  07/21/2017. FINDINGS: Dual lead pacer unchanged. Cardiomegaly. Vascular congestion with  low lung volumes. Small BILATERAL effusions. No consolidation or frank edema. No visible pneumothorax. Thoracic spine degenerative change. Prior CABG. IMPRESSION: Low lung volumes with cardiomegaly. Mild vascular congestion. Slight worsening aeration from priors. Electronically Signed   By: Elsie Stain M.D.   On: 10/27/2017 09:22   Ct Head Wo Contrast  Result Date: 10/27/2017 CLINICAL DATA:  Altered mental status.  Unwitnessed fall. EXAM: CT HEAD WITHOUT CONTRAST CT CERVICAL SPINE WITHOUT CONTRAST TECHNIQUE: Multidetector CT imaging of the head and cervical spine was performed following the standard protocol without intravenous contrast. Multiplanar CT image reconstructions of the cervical spine were also generated. COMPARISON:  07/21/2017. FINDINGS: CT HEAD FINDINGS Brain: Diffusely enlarged ventricles and subarachnoid spaces. Patchy white matter low density in both cerebral hemispheres. No intracranial hemorrhage, mass lesion or CT evidence of acute infarction. Vascular: No hyperdense vessel or unexpected calcification. Skull: Normal. Negative for fracture or focal lesion. Sinuses/Orbits: Small amount of fluid and mucosal thickening in the posterior aspect of the left mastoid air cells with improvement. Small left maxillary sinus retention cyst. Status post bilateral cataract extraction. Other: None. CT CERVICAL SPINE FINDINGS Alignment: Normal. Skull base and vertebrae: Previously demonstrated bilateral C1 ring fractures with nonunion. No acute fracture. No primary bone lesion or focal pathologic process. Soft tissues and spinal canal: No prevertebral fluid or swelling. No visible canal hematoma. Disc levels:  Multilevel degenerative changes. Upper chest: Clear lung apices. Other: Bilateral carotid artery calcifications. IMPRESSION: 1. No skull fracture or intracranial hemorrhage. 2. No acute cervical spine fracture or subluxation. 3. Previously demonstrated old bilateral C1 ring fractures with nonunion. 4.  Stable moderate diffuse cerebral and cerebellar atrophy and minimal chronic small vessel white matter ischemic changes in both cerebral hemispheres. 5. Cervical spine degenerative changes. 6. Bilateral carotid artery atheromatous calcifications. Electronically Signed   By: Beckie Salts M.D.   On: 10/27/2017 10:28   Ct Cervical Spine Wo Contrast  Result Date: 10/27/2017 CLINICAL DATA:  Altered mental status.  Unwitnessed fall. EXAM: CT HEAD WITHOUT CONTRAST CT CERVICAL SPINE WITHOUT CONTRAST TECHNIQUE: Multidetector CT imaging of the head and cervical spine was performed following the standard protocol without intravenous contrast. Multiplanar CT image reconstructions of the cervical spine were  also generated. COMPARISON:  07/21/2017. FINDINGS: CT HEAD FINDINGS Brain: Diffusely enlarged ventricles and subarachnoid spaces. Patchy white matter low density in both cerebral hemispheres. No intracranial hemorrhage, mass lesion or CT evidence of acute infarction. Vascular: No hyperdense vessel or unexpected calcification. Skull: Normal. Negative for fracture or focal lesion. Sinuses/Orbits: Small amount of fluid and mucosal thickening in the posterior aspect of the left mastoid air cells with improvement. Small left maxillary sinus retention cyst. Status post bilateral cataract extraction. Other: None. CT CERVICAL SPINE FINDINGS Alignment: Normal. Skull base and vertebrae: Previously demonstrated bilateral C1 ring fractures with nonunion. No acute fracture. No primary bone lesion or focal pathologic process. Soft tissues and spinal canal: No prevertebral fluid or swelling. No visible canal hematoma. Disc levels:  Multilevel degenerative changes. Upper chest: Clear lung apices. Other: Bilateral carotid artery calcifications. IMPRESSION: 1. No skull fracture or intracranial hemorrhage. 2. No acute cervical spine fracture or subluxation. 3. Previously demonstrated old bilateral C1 ring fractures with nonunion. 4. Stable  moderate diffuse cerebral and cerebellar atrophy and minimal chronic small vessel white matter ischemic changes in both cerebral hemispheres. 5. Cervical spine degenerative changes. 6. Bilateral carotid artery atheromatous calcifications. Electronically Signed   By: Beckie Salts M.D.   On: 10/27/2017 10:28    Procedures Procedures (including critical care time)  Medications Ordered in ED Medications - No data to display   Initial Impression / Assessment and Plan / ED Course  I have reviewed the triage vital signs and the nursing notes.  Pertinent labs & imaging results that were available during my care of the patient were reviewed by me and considered in my medical decision making (see chart for details).    Dwayne [name] is a 83 y.o. male who presents to ED for unwitnessed fall at facility. Baseline mental status per facility. No focal neuro deficits appreciated. No signs of trauma on exam. Imaging with no acute abnormalities. Will discharge back to facility in satisfactory condition.   Patient seen by and discussed with Dr. Madilyn Hook who agrees with treatment plan.   Final Clinical Impressions(s) / ED Diagnoses   Final diagnoses:  Fall    ED Discharge Orders    None       Abdulla Pooley, Chase Picket, PA-C 10/27/17 1059    Tilden Fossa, MD 10/28/17 [phone]

## 2017-10-27 NOTE — ED Notes (Signed)
Pt returned from X-ray.  

## 2017-10-27 NOTE — Discharge Instructions (Signed)
It was my pleasure taking care of you today!   Fortunately, your chest x-ray and CT head and cervical spine were negative for injuries from your fall.   Please keep scheduled appointments with your doctors.   Return to ER for new or worsening symptoms, any additional concerns.

## 2017-10-28 DIAGNOSIS — I110 Hypertensive heart disease with heart failure: Secondary | ICD-10-CM | POA: Diagnosis not present

## 2017-10-28 DIAGNOSIS — Z888 Allergy status to other drugs, medicaments and biological substances status: Secondary | ICD-10-CM | POA: Diagnosis not present

## 2017-10-28 DIAGNOSIS — Z7982 Long term (current) use of aspirin: Secondary | ICD-10-CM | POA: Diagnosis not present

## 2017-10-28 DIAGNOSIS — G4489 Other headache syndrome: Secondary | ICD-10-CM | POA: Diagnosis not present

## 2017-10-28 DIAGNOSIS — I4891 Unspecified atrial fibrillation: Secondary | ICD-10-CM | POA: Diagnosis not present

## 2017-10-28 DIAGNOSIS — S01112A Laceration without foreign body of left eyelid and periocular area, initial encounter: Secondary | ICD-10-CM | POA: Diagnosis not present

## 2017-10-28 DIAGNOSIS — S098XXA Other specified injuries of head, initial encounter: Secondary | ICD-10-CM | POA: Diagnosis not present

## 2017-10-28 DIAGNOSIS — S0990XA Unspecified injury of head, initial encounter: Secondary | ICD-10-CM | POA: Diagnosis not present

## 2017-10-28 DIAGNOSIS — Z87891 Personal history of nicotine dependence: Secondary | ICD-10-CM | POA: Diagnosis not present

## 2017-10-28 DIAGNOSIS — Z7409 Other reduced mobility: Secondary | ICD-10-CM | POA: Diagnosis not present

## 2017-10-28 DIAGNOSIS — S8991XA Unspecified injury of right lower leg, initial encounter: Secondary | ICD-10-CM | POA: Diagnosis not present

## 2017-10-28 DIAGNOSIS — Z7901 Long term (current) use of anticoagulants: Secondary | ICD-10-CM | POA: Diagnosis not present

## 2017-10-28 DIAGNOSIS — R51 Headache: Secondary | ICD-10-CM | POA: Diagnosis not present

## 2017-10-28 DIAGNOSIS — Z880 Allergy status to penicillin: Secondary | ICD-10-CM | POA: Diagnosis not present

## 2017-10-28 DIAGNOSIS — I509 Heart failure, unspecified: Secondary | ICD-10-CM | POA: Diagnosis not present

## 2017-10-28 DIAGNOSIS — S0101XA Laceration without foreign body of scalp, initial encounter: Secondary | ICD-10-CM | POA: Diagnosis not present

## 2017-10-28 DIAGNOSIS — Z881 Allergy status to other antibiotic agents status: Secondary | ICD-10-CM | POA: Diagnosis not present

## 2017-10-28 DIAGNOSIS — W010XXA Fall on same level from slipping, tripping and stumbling without subsequent striking against object, initial encounter: Secondary | ICD-10-CM | POA: Diagnosis not present

## 2017-10-28 DIAGNOSIS — Z79899 Other long term (current) drug therapy: Secondary | ICD-10-CM | POA: Diagnosis not present

## 2017-10-28 DIAGNOSIS — F0390 Unspecified dementia without behavioral disturbance: Secondary | ICD-10-CM | POA: Diagnosis not present

## 2017-10-28 DIAGNOSIS — Y9301 Activity, walking, marching and hiking: Secondary | ICD-10-CM | POA: Diagnosis not present

## 2017-10-28 DIAGNOSIS — E785 Hyperlipidemia, unspecified: Secondary | ICD-10-CM | POA: Diagnosis not present

## 2017-10-29 DIAGNOSIS — I509 Heart failure, unspecified: Secondary | ICD-10-CM | POA: Diagnosis not present

## 2017-10-29 DIAGNOSIS — M1990 Unspecified osteoarthritis, unspecified site: Secondary | ICD-10-CM | POA: Diagnosis not present

## 2017-10-29 DIAGNOSIS — I110 Hypertensive heart disease with heart failure: Secondary | ICD-10-CM | POA: Diagnosis not present

## 2017-10-29 DIAGNOSIS — F0390 Unspecified dementia without behavioral disturbance: Secondary | ICD-10-CM | POA: Diagnosis not present

## 2017-10-29 DIAGNOSIS — J479 Bronchiectasis, uncomplicated: Secondary | ICD-10-CM | POA: Diagnosis not present

## 2017-10-29 DIAGNOSIS — I4891 Unspecified atrial fibrillation: Secondary | ICD-10-CM | POA: Diagnosis not present

## 2017-10-31 DIAGNOSIS — I4891 Unspecified atrial fibrillation: Secondary | ICD-10-CM | POA: Diagnosis not present

## 2017-10-31 DIAGNOSIS — J479 Bronchiectasis, uncomplicated: Secondary | ICD-10-CM | POA: Diagnosis not present

## 2017-10-31 DIAGNOSIS — I509 Heart failure, unspecified: Secondary | ICD-10-CM | POA: Diagnosis not present

## 2017-10-31 DIAGNOSIS — M1990 Unspecified osteoarthritis, unspecified site: Secondary | ICD-10-CM | POA: Diagnosis not present

## 2017-10-31 DIAGNOSIS — I110 Hypertensive heart disease with heart failure: Secondary | ICD-10-CM | POA: Diagnosis not present

## 2017-10-31 DIAGNOSIS — F0390 Unspecified dementia without behavioral disturbance: Secondary | ICD-10-CM | POA: Diagnosis not present

## 2017-11-01 DIAGNOSIS — I509 Heart failure, unspecified: Secondary | ICD-10-CM | POA: Diagnosis not present

## 2017-11-01 DIAGNOSIS — I110 Hypertensive heart disease with heart failure: Secondary | ICD-10-CM | POA: Diagnosis not present

## 2017-11-01 DIAGNOSIS — I4891 Unspecified atrial fibrillation: Secondary | ICD-10-CM | POA: Diagnosis not present

## 2017-11-01 DIAGNOSIS — J479 Bronchiectasis, uncomplicated: Secondary | ICD-10-CM | POA: Diagnosis not present

## 2017-11-01 DIAGNOSIS — F0390 Unspecified dementia without behavioral disturbance: Secondary | ICD-10-CM | POA: Diagnosis not present

## 2017-11-01 DIAGNOSIS — M1990 Unspecified osteoarthritis, unspecified site: Secondary | ICD-10-CM | POA: Diagnosis not present

## 2017-11-02 ENCOUNTER — Encounter: Payer: Self-pay | Admitting: *Deleted

## 2017-11-02 DIAGNOSIS — M1990 Unspecified osteoarthritis, unspecified site: Secondary | ICD-10-CM | POA: Diagnosis not present

## 2017-11-02 DIAGNOSIS — I110 Hypertensive heart disease with heart failure: Secondary | ICD-10-CM | POA: Diagnosis not present

## 2017-11-02 DIAGNOSIS — J479 Bronchiectasis, uncomplicated: Secondary | ICD-10-CM | POA: Diagnosis not present

## 2017-11-02 DIAGNOSIS — I4891 Unspecified atrial fibrillation: Secondary | ICD-10-CM | POA: Diagnosis not present

## 2017-11-02 DIAGNOSIS — F0390 Unspecified dementia without behavioral disturbance: Secondary | ICD-10-CM | POA: Diagnosis not present

## 2017-11-02 DIAGNOSIS — I509 Heart failure, unspecified: Secondary | ICD-10-CM | POA: Diagnosis not present

## 2017-11-02 NOTE — Progress Notes (Signed)
HPI: FU CAD s/p CABG, atrial fibrillation, HTN. Per notes, he had CABG 1993 with LIMA- LAD, SVG to OM, SVG to left circumflex, SVG to PD/PLSA. He had PCI of the saphenous vein graft to his obtuse marginal in 2003. Abdominal CT in June 2011 showed no abdominal aortic aneurysm. He was hospitalized February 2015 for GIB with Hgb down to 7.6 (was 11-12 in December). He was treated with Vit K and PRBCs. Coumadin was held. Last echocardiogram April 2015 showed an ejection fraction of 50-55%, mild mitral regurgitation and trace aortic insufficiency. Now on apixaban. Since he was last seen, patient denies dyspnea, chest pain or syncope. Occasional mild pedal edema.   Current Outpatient Medications  Medication Sig Dispense Refill  . apixaban (ELIQUIS) 5 MG TABS tablet Take 1 tablet (5 mg total) by mouth 2 (two) times daily. 180 tablet 3  . b complex vitamins tablet Take 1 tablet by mouth daily.    . cholecalciferol (VITAMIN D) 1000 units tablet Take 3,000 Units by mouth daily.    . cyanocobalamin (,VITAMIN B-12,) 1000 MCG/ML injection Inject 1,000 mcg into the muscle every 30 (thirty) days.     [name] Kitchen denosumab (PROLIA) 60 MG/ML SOLN injection Inject 60 mg into the skin every 6 (six) months. Administer in upper arm, thigh, or abdomen    . divalproex (DEPAKOTE SPRINKLE) 125 MG capsule Take 250 mg by mouth 2 (two) times daily.    . Doxepin HCl (SILENOR) 6 MG TABS Take 1 tablet (6 mg total) by mouth at bedtime as needed. 90 tablet 1  . finasteride (PROSCAR) 5 MG tablet Take 1 tablet (5 mg total) by mouth at bedtime. 90 tablet 3  . LORazepam (ATIVAN) 0.5 MG tablet Take 0.5 mg by mouth every 8 (eight) hours as needed for anxiety (Behavior disturbances).    . metoprolol tartrate (LOPRESSOR) 25 MG tablet Take 1 tablet (25 mg total) by mouth 2 (two) times daily. 180 tablet 3  . nitroGLYCERIN (NITROSTAT) 0.4 MG SL tablet Place 1 tablet (0.4 mg total) under the tongue every 5 (five) minutes x 3 doses as needed for  chest pain. 25 tablet 1  . omeprazole (PRILOSEC) 40 MG capsule Take 1 capsule (40 mg total) by mouth daily. (Patient taking differently: Take 20 mg by mouth daily. ) 90 capsule 1  . QUEtiapine (SEROQUEL) 25 MG tablet Take 25 mg by mouth 2 (two) times daily.    . risperiDONE (RISPERDAL) 0.5 MG tablet Take 0.5 mg by mouth 2 (two) times daily.    . rivastigmine (EXELON) 6 MG capsule Take 6 mg by mouth 2 (two) times daily.    [name] Kitchen terazosin (HYTRIN) 5 MG capsule Take 1 capsule (5 mg total) by mouth daily. 90 capsule 3   Current Facility-Administered Medications  Medication Dose Route Frequency Provider Last Rate Last Dose  . denosumab (PROLIA) injection 60 mg  60 mg Subcutaneous Q6 months [name], MD   60 mg at 04/07/17 1618     Past Medical History:  Diagnosis Date  . Allergic rhinitis, cause unspecified   . Arthritis   . Atrial fibrillation (HCC)    a. Was on Coumadin until GIB 01/2014.  . Bronchiectasis without acute exacerbation (HCC)   . Coronary artery disease    a. s/p CABG 1993. b. s/p PCI of SVG to OM '03.  . Dementia 07/21/2017   Per son report  . Disorders of diaphragm   . Diverticulosis   . Dorsalgia   .  Epistaxis   . GERD (gastroesophageal reflux disease)   . H/O: GI bleed    a. 01/2014 - presumed diverticular. Received PRBC, Vit K. Coumadin put on hold.  . Hyperlipidemia   . Hypertension   . Insomnia   . LV dysfunction    a. Echo 08/2013: EF 45-50%.  . Myocardial infarction (HCC)   . Osteoporosis   . Plantar fascial fibromatosis   . Spinal stenosis, unspecified region other than cervical   . Tachy-brady syndrome [facility](HCC)    s/p Medtronic PPM '07  . Unspecified sinusitis (chronic)     Past Surgical History:  Procedure Laterality Date  . CATARACT EXTRACTION W/ INTRAOCULAR LENS  IMPLANT, BILATERAL    . CHOLECYSTECTOMY    . COLONOSCOPY    . CORONARY ARTERY BYPASS GRAFT     LIMA to LAD, SVG to OM, SVG to left circumflex, SVG to PD/PLSA  . DOPPLER  ECHOCARDIOGRAPHY  2011  . HIP ARTHROPLASTY Right 12/10/2013   Procedure: Right Hip Cemented Hemiarthroplasty;  Surgeon: Eldred MangesMark C Yates, MD;  [facility];  Service: Orthopedics;  Laterality: Right;  Right Hip Cemented Hemiarthroplasty  . KIDNEY CYST REMOVAL    . PACEMAKER INSERTION     2007, Medtronic dual-chamber  . PERMANENT PACEMAKER GENERATOR CHANGE N/A 08/14/2014   Procedure: PERMANENT PACEMAKER GENERATOR CHANGE;  Surgeon: Marinus MawGregg W Taylor, MD;  [facility];  Service: Cardiovascular;  Laterality: N/A;  . VERTEBROPLASTY      Social History   Socioeconomic History  . Marital status: Married    Spouse name: Ollen GrossMarjorie  . Number of children: 4  . Years of education: Not on file  . Highest education level: Not on file  Social Needs  . Financial resource strain: Not on file  . Food insecurity - worry: Not on file  . Food insecurity - inability: Not on file  . Transportation needs - medical: Not on file  . Transportation needs - non-medical: Not on file  Occupational History    Employer: RETIRED  Tobacco Use  . Smoking status: Former Games developermoker  . Smokeless tobacco: Never Used  . Tobacco comment: x 6 months in high school  Substance and Sexual Activity  . Alcohol use: No    Comment: none  . Drug use: No  . Sexual activity: No  Other Topics Concern  . Not on file  Social History Narrative   Married.  Ambulates with a walker.    Family History  Problem Relation Age of Onset  . Stroke Mother   . Diabetes Son   . Cancer Son   . Colon cancer Neg Hx   . Heart disease Neg Hx   . Throat cancer Neg Hx   . Pancreatic cancer Neg Hx   . Kidney disease Neg Hx   . Liver disease Neg Hx   . Osteoporosis Neg Hx     ROS: no fevers or chills, productive cough, hemoptysis, dysphasia, odynophagia, melena, hematochezia, dysuria, hematuria, rash, seizure activity, orthopnea, PND, claudication. Remaining systems are negative.  Physical Exam: Well-developed frail in no acute  distress.  Skin is warm and dry.  HEENT is normal.  Neck is supple.  Chest is clear to auscultation with normal expansion.  Cardiovascular exam is regular rate and rhythm. 2/6 systolic murmur; S2 not diminished Abdominal exam nontender or distended. No masses palpated. Extremities show 1+ edema, with chronic skin changes. neuro grossly intact other than mild dementia   A/P  1 coronary artery disease status post coronary artery  bypass and graft-patient is not on aspirin given need for anticoagulation. Statin was discontinued previously by primary care because there was concern it was coontibuting to dementia.  2 permanent atrial fibrillation-continue beta blocker for rate control. Continue apixaban. Note patient fell recently. I have encouraged him to use his walker. If he has more frequent falls in the future we'll need to consider discontinuing anticoagulation as risk may outweigh benefit.  3 hypertension-blood pressure is controlled. Continue present medications.  4 hyperlipidemia-we will continue with diet. Statin discontinued previously because of concern it was contributing to dementia.  5 prior pacemaker-managed by electrophysiology. Will arrange fu with  Dr Ladona Ridgelaylor.  Olga MillersBrian Tarry Fountain, MD

## 2017-11-03 DIAGNOSIS — I4891 Unspecified atrial fibrillation: Secondary | ICD-10-CM | POA: Diagnosis not present

## 2017-11-03 DIAGNOSIS — F0390 Unspecified dementia without behavioral disturbance: Secondary | ICD-10-CM | POA: Diagnosis not present

## 2017-11-03 DIAGNOSIS — I509 Heart failure, unspecified: Secondary | ICD-10-CM | POA: Diagnosis not present

## 2017-11-03 DIAGNOSIS — I110 Hypertensive heart disease with heart failure: Secondary | ICD-10-CM | POA: Diagnosis not present

## 2017-11-03 DIAGNOSIS — M1990 Unspecified osteoarthritis, unspecified site: Secondary | ICD-10-CM | POA: Diagnosis not present

## 2017-11-03 DIAGNOSIS — J479 Bronchiectasis, uncomplicated: Secondary | ICD-10-CM | POA: Diagnosis not present

## 2017-11-04 DIAGNOSIS — M1990 Unspecified osteoarthritis, unspecified site: Secondary | ICD-10-CM | POA: Diagnosis not present

## 2017-11-04 DIAGNOSIS — F0390 Unspecified dementia without behavioral disturbance: Secondary | ICD-10-CM | POA: Diagnosis not present

## 2017-11-04 DIAGNOSIS — I110 Hypertensive heart disease with heart failure: Secondary | ICD-10-CM | POA: Diagnosis not present

## 2017-11-04 DIAGNOSIS — I509 Heart failure, unspecified: Secondary | ICD-10-CM | POA: Diagnosis not present

## 2017-11-04 DIAGNOSIS — I4891 Unspecified atrial fibrillation: Secondary | ICD-10-CM | POA: Diagnosis not present

## 2017-11-04 DIAGNOSIS — J479 Bronchiectasis, uncomplicated: Secondary | ICD-10-CM | POA: Diagnosis not present

## 2017-11-07 DIAGNOSIS — I110 Hypertensive heart disease with heart failure: Secondary | ICD-10-CM | POA: Diagnosis not present

## 2017-11-07 DIAGNOSIS — M1990 Unspecified osteoarthritis, unspecified site: Secondary | ICD-10-CM | POA: Diagnosis not present

## 2017-11-07 DIAGNOSIS — F0390 Unspecified dementia without behavioral disturbance: Secondary | ICD-10-CM | POA: Diagnosis not present

## 2017-11-07 DIAGNOSIS — I509 Heart failure, unspecified: Secondary | ICD-10-CM | POA: Diagnosis not present

## 2017-11-07 DIAGNOSIS — I4891 Unspecified atrial fibrillation: Secondary | ICD-10-CM | POA: Diagnosis not present

## 2017-11-07 DIAGNOSIS — J479 Bronchiectasis, uncomplicated: Secondary | ICD-10-CM | POA: Diagnosis not present

## 2017-11-08 DIAGNOSIS — M1990 Unspecified osteoarthritis, unspecified site: Secondary | ICD-10-CM | POA: Diagnosis not present

## 2017-11-08 DIAGNOSIS — I4891 Unspecified atrial fibrillation: Secondary | ICD-10-CM | POA: Diagnosis not present

## 2017-11-08 DIAGNOSIS — I110 Hypertensive heart disease with heart failure: Secondary | ICD-10-CM | POA: Diagnosis not present

## 2017-11-08 DIAGNOSIS — J479 Bronchiectasis, uncomplicated: Secondary | ICD-10-CM | POA: Diagnosis not present

## 2017-11-08 DIAGNOSIS — I509 Heart failure, unspecified: Secondary | ICD-10-CM | POA: Diagnosis not present

## 2017-11-08 DIAGNOSIS — F0390 Unspecified dementia without behavioral disturbance: Secondary | ICD-10-CM | POA: Diagnosis not present

## 2017-11-09 DIAGNOSIS — M1990 Unspecified osteoarthritis, unspecified site: Secondary | ICD-10-CM | POA: Diagnosis not present

## 2017-11-09 DIAGNOSIS — F0390 Unspecified dementia without behavioral disturbance: Secondary | ICD-10-CM | POA: Diagnosis not present

## 2017-11-09 DIAGNOSIS — I110 Hypertensive heart disease with heart failure: Secondary | ICD-10-CM | POA: Diagnosis not present

## 2017-11-09 DIAGNOSIS — I509 Heart failure, unspecified: Secondary | ICD-10-CM | POA: Diagnosis not present

## 2017-11-09 DIAGNOSIS — J479 Bronchiectasis, uncomplicated: Secondary | ICD-10-CM | POA: Diagnosis not present

## 2017-11-09 DIAGNOSIS — I4891 Unspecified atrial fibrillation: Secondary | ICD-10-CM | POA: Diagnosis not present

## 2017-11-11 DIAGNOSIS — I509 Heart failure, unspecified: Secondary | ICD-10-CM | POA: Diagnosis not present

## 2017-11-11 DIAGNOSIS — I110 Hypertensive heart disease with heart failure: Secondary | ICD-10-CM | POA: Diagnosis not present

## 2017-11-11 DIAGNOSIS — I4891 Unspecified atrial fibrillation: Secondary | ICD-10-CM | POA: Diagnosis not present

## 2017-11-11 DIAGNOSIS — F0390 Unspecified dementia without behavioral disturbance: Secondary | ICD-10-CM | POA: Diagnosis not present

## 2017-11-11 DIAGNOSIS — M1990 Unspecified osteoarthritis, unspecified site: Secondary | ICD-10-CM | POA: Diagnosis not present

## 2017-11-11 DIAGNOSIS — J479 Bronchiectasis, uncomplicated: Secondary | ICD-10-CM | POA: Diagnosis not present

## 2017-11-14 DIAGNOSIS — M1990 Unspecified osteoarthritis, unspecified site: Secondary | ICD-10-CM | POA: Diagnosis not present

## 2017-11-14 DIAGNOSIS — I4891 Unspecified atrial fibrillation: Secondary | ICD-10-CM | POA: Diagnosis not present

## 2017-11-14 DIAGNOSIS — J479 Bronchiectasis, uncomplicated: Secondary | ICD-10-CM | POA: Diagnosis not present

## 2017-11-14 DIAGNOSIS — I509 Heart failure, unspecified: Secondary | ICD-10-CM | POA: Diagnosis not present

## 2017-11-14 DIAGNOSIS — F0390 Unspecified dementia without behavioral disturbance: Secondary | ICD-10-CM | POA: Diagnosis not present

## 2017-11-14 DIAGNOSIS — I110 Hypertensive heart disease with heart failure: Secondary | ICD-10-CM | POA: Diagnosis not present

## 2017-11-15 ENCOUNTER — Ambulatory Visit (INDEPENDENT_AMBULATORY_CARE_PROVIDER_SITE_OTHER): Payer: Medicare Other | Admitting: Cardiology

## 2017-11-15 ENCOUNTER — Encounter: Payer: Self-pay | Admitting: Cardiology

## 2017-11-15 ENCOUNTER — Telehealth: Payer: Self-pay | Admitting: Rheumatology

## 2017-11-15 ENCOUNTER — Telehealth: Payer: Self-pay | Admitting: *Deleted

## 2017-11-15 ENCOUNTER — Telehealth: Payer: Self-pay | Admitting: Internal Medicine

## 2017-11-15 VITALS — BP 100/50 | HR 78 | Ht 71.0 in | Wt 161.0 lb

## 2017-11-15 DIAGNOSIS — Z950 Presence of cardiac pacemaker: Secondary | ICD-10-CM

## 2017-11-15 DIAGNOSIS — I110 Hypertensive heart disease with heart failure: Secondary | ICD-10-CM | POA: Diagnosis not present

## 2017-11-15 DIAGNOSIS — I2510 Atherosclerotic heart disease of native coronary artery without angina pectoris: Secondary | ICD-10-CM | POA: Diagnosis not present

## 2017-11-15 DIAGNOSIS — I4891 Unspecified atrial fibrillation: Secondary | ICD-10-CM | POA: Diagnosis not present

## 2017-11-15 DIAGNOSIS — J479 Bronchiectasis, uncomplicated: Secondary | ICD-10-CM | POA: Diagnosis not present

## 2017-11-15 DIAGNOSIS — I4821 Permanent atrial fibrillation: Secondary | ICD-10-CM

## 2017-11-15 DIAGNOSIS — I10 Essential (primary) hypertension: Secondary | ICD-10-CM

## 2017-11-15 DIAGNOSIS — F0390 Unspecified dementia without behavioral disturbance: Secondary | ICD-10-CM | POA: Diagnosis not present

## 2017-11-15 DIAGNOSIS — I482 Chronic atrial fibrillation: Secondary | ICD-10-CM | POA: Diagnosis not present

## 2017-11-15 DIAGNOSIS — I25810 Atherosclerosis of coronary artery bypass graft(s) without angina pectoris: Secondary | ICD-10-CM

## 2017-11-15 DIAGNOSIS — I509 Heart failure, unspecified: Secondary | ICD-10-CM | POA: Diagnosis not present

## 2017-11-15 DIAGNOSIS — M1990 Unspecified osteoarthritis, unspecified site: Secondary | ICD-10-CM | POA: Diagnosis not present

## 2017-11-15 NOTE — Telephone Encounter (Signed)
Copied from CRM [id number]. Topic: Quick Communication - See Telephone Encounter >> Nov 15, 2017  4:17 PM Cipriano BunkerLambe, Annette S wrote: CRM for notification. See Telephone encounter for:  Almira CoasterGina [phone] [facility] requesting Prolea Injection. Has been more than 6 months.   11/15/17.

## 2017-11-15 NOTE — Telephone Encounter (Signed)
Freddi StarrMathis, Debra W, RN  P [facility] [address] Device        Dr Jens Somcrenshaw saw this patient this morning and the facility where he lives wanted a copy of the pacer checks for this year. The last one I see in the chart is 07-2016. Can you please follow up with the facility regarding this. [facility] 336 [phone]. Thank you.    LMOM to return call. No remote transmissions received in 1 yr, overdue for follow up. Message sent to scheduling to arrange follow up with [facility].

## 2017-11-15 NOTE — Telephone Encounter (Signed)
Please advise on injection?  

## 2017-11-15 NOTE — Telephone Encounter (Signed)
Dwayne Mylardvised Dwayne [name] with assisted living that the patient is not given the Prolia here at our office. That injection is given at Dr. Yetta BarreJones' office. She will contact that office to set up appointment.

## 2017-11-15 NOTE — Patient Instructions (Signed)
Your physician wants you to follow-up in: 6 MONTHS WITH DR Jens SomRENSHAW You will receive a reminder letter in the mail two months in advance. If you don't receive a letter, please call our office to schedule the follow-up appointment.   SCHEDULE FOLLOW UP DEVICE CHECK WITH DR Ladona RidgelAYLOR

## 2017-11-15 NOTE — Telephone Encounter (Signed)
[facility] would like to know if we give the Prolia injection in office to patient. Please call to advise.

## 2017-11-16 DIAGNOSIS — I110 Hypertensive heart disease with heart failure: Secondary | ICD-10-CM | POA: Diagnosis not present

## 2017-11-16 DIAGNOSIS — I509 Heart failure, unspecified: Secondary | ICD-10-CM | POA: Diagnosis not present

## 2017-11-16 DIAGNOSIS — M1990 Unspecified osteoarthritis, unspecified site: Secondary | ICD-10-CM | POA: Diagnosis not present

## 2017-11-16 DIAGNOSIS — I4891 Unspecified atrial fibrillation: Secondary | ICD-10-CM | POA: Diagnosis not present

## 2017-11-16 DIAGNOSIS — F0390 Unspecified dementia without behavioral disturbance: Secondary | ICD-10-CM | POA: Diagnosis not present

## 2017-11-16 DIAGNOSIS — J479 Bronchiectasis, uncomplicated: Secondary | ICD-10-CM | POA: Diagnosis not present

## 2017-11-16 NOTE — Telephone Encounter (Signed)
>>   Nov 16, 2017  4:45 PM Clack, Princella PellegriniJessica D wrote: Almira CoasterGina was calling back to let the nurse know the pt is PEC under Dr. Lauraine RinneScott Sheldon

## 2017-11-16 NOTE — Telephone Encounter (Signed)
Talked with gina---she is going to find out if facility provider will now be acting as patient's primary care provider and taking over responsibility for labs/med changes, etc----we cannot call in prolia to facility without seeing patient periodically for labs, etc---so if patient is unable to come to our office, facility will need to assign a new pcp for patient

## 2017-11-17 DIAGNOSIS — J479 Bronchiectasis, uncomplicated: Secondary | ICD-10-CM | POA: Diagnosis not present

## 2017-11-17 DIAGNOSIS — M1990 Unspecified osteoarthritis, unspecified site: Secondary | ICD-10-CM | POA: Diagnosis not present

## 2017-11-17 DIAGNOSIS — I509 Heart failure, unspecified: Secondary | ICD-10-CM | POA: Diagnosis not present

## 2017-11-17 DIAGNOSIS — I4891 Unspecified atrial fibrillation: Secondary | ICD-10-CM | POA: Diagnosis not present

## 2017-11-17 DIAGNOSIS — F0390 Unspecified dementia without behavioral disturbance: Secondary | ICD-10-CM | POA: Diagnosis not present

## 2017-11-17 DIAGNOSIS — I110 Hypertensive heart disease with heart failure: Secondary | ICD-10-CM | POA: Diagnosis not present

## 2017-11-18 DIAGNOSIS — I509 Heart failure, unspecified: Secondary | ICD-10-CM | POA: Diagnosis not present

## 2017-11-18 DIAGNOSIS — M1990 Unspecified osteoarthritis, unspecified site: Secondary | ICD-10-CM | POA: Diagnosis not present

## 2017-11-18 DIAGNOSIS — I110 Hypertensive heart disease with heart failure: Secondary | ICD-10-CM | POA: Diagnosis not present

## 2017-11-18 DIAGNOSIS — J479 Bronchiectasis, uncomplicated: Secondary | ICD-10-CM | POA: Diagnosis not present

## 2017-11-18 DIAGNOSIS — F0390 Unspecified dementia without behavioral disturbance: Secondary | ICD-10-CM | POA: Diagnosis not present

## 2017-11-18 DIAGNOSIS — I4891 Unspecified atrial fibrillation: Secondary | ICD-10-CM | POA: Diagnosis not present

## 2017-11-21 DIAGNOSIS — Z7901 Long term (current) use of anticoagulants: Secondary | ICD-10-CM | POA: Diagnosis not present

## 2017-11-21 DIAGNOSIS — Z881 Allergy status to other antibiotic agents status: Secondary | ICD-10-CM | POA: Diagnosis not present

## 2017-11-21 DIAGNOSIS — M810 Age-related osteoporosis without current pathological fracture: Secondary | ICD-10-CM | POA: Diagnosis not present

## 2017-11-21 DIAGNOSIS — R4182 Altered mental status, unspecified: Secondary | ICD-10-CM | POA: Diagnosis not present

## 2017-11-21 DIAGNOSIS — Z7982 Long term (current) use of aspirin: Secondary | ICD-10-CM | POA: Diagnosis not present

## 2017-11-21 DIAGNOSIS — I10 Essential (primary) hypertension: Secondary | ICD-10-CM | POA: Diagnosis not present

## 2017-11-21 DIAGNOSIS — Z880 Allergy status to penicillin: Secondary | ICD-10-CM | POA: Diagnosis not present

## 2017-11-21 DIAGNOSIS — J479 Bronchiectasis, uncomplicated: Secondary | ICD-10-CM | POA: Diagnosis not present

## 2017-11-21 DIAGNOSIS — F0390 Unspecified dementia without behavioral disturbance: Secondary | ICD-10-CM | POA: Diagnosis not present

## 2017-11-21 DIAGNOSIS — Z79899 Other long term (current) drug therapy: Secondary | ICD-10-CM | POA: Diagnosis not present

## 2017-11-21 DIAGNOSIS — I4891 Unspecified atrial fibrillation: Secondary | ICD-10-CM | POA: Diagnosis not present

## 2017-11-21 DIAGNOSIS — G9389 Other specified disorders of brain: Secondary | ICD-10-CM | POA: Diagnosis not present

## 2017-11-21 DIAGNOSIS — R451 Restlessness and agitation: Secondary | ICD-10-CM | POA: Diagnosis not present

## 2017-11-21 DIAGNOSIS — I110 Hypertensive heart disease with heart failure: Secondary | ICD-10-CM | POA: Diagnosis not present

## 2017-11-21 DIAGNOSIS — Z882 Allergy status to sulfonamides status: Secondary | ICD-10-CM | POA: Diagnosis not present

## 2017-11-21 DIAGNOSIS — M1990 Unspecified osteoarthritis, unspecified site: Secondary | ICD-10-CM | POA: Diagnosis not present

## 2017-11-21 DIAGNOSIS — I509 Heart failure, unspecified: Secondary | ICD-10-CM | POA: Diagnosis not present

## 2017-11-21 DIAGNOSIS — Z888 Allergy status to other drugs, medicaments and biological substances status: Secondary | ICD-10-CM | POA: Diagnosis not present

## 2017-11-21 DIAGNOSIS — R402441 Other coma, without documented Glasgow coma scale score, or with partial score reported, in the field [EMT or ambulance]: Secondary | ICD-10-CM | POA: Diagnosis not present

## 2017-11-22 DIAGNOSIS — I110 Hypertensive heart disease with heart failure: Secondary | ICD-10-CM | POA: Diagnosis not present

## 2017-11-22 DIAGNOSIS — Z7409 Other reduced mobility: Secondary | ICD-10-CM | POA: Diagnosis not present

## 2017-11-22 DIAGNOSIS — I4891 Unspecified atrial fibrillation: Secondary | ICD-10-CM | POA: Diagnosis not present

## 2017-11-22 DIAGNOSIS — R4182 Altered mental status, unspecified: Secondary | ICD-10-CM | POA: Diagnosis not present

## 2017-11-22 DIAGNOSIS — I509 Heart failure, unspecified: Secondary | ICD-10-CM | POA: Diagnosis not present

## 2017-11-22 DIAGNOSIS — J479 Bronchiectasis, uncomplicated: Secondary | ICD-10-CM | POA: Diagnosis not present

## 2017-11-22 DIAGNOSIS — F0390 Unspecified dementia without behavioral disturbance: Secondary | ICD-10-CM | POA: Diagnosis not present

## 2017-11-22 DIAGNOSIS — F0151 Vascular dementia with behavioral disturbance: Secondary | ICD-10-CM | POA: Diagnosis not present

## 2017-11-22 DIAGNOSIS — M1990 Unspecified osteoarthritis, unspecified site: Secondary | ICD-10-CM | POA: Diagnosis not present

## 2017-11-23 DIAGNOSIS — I110 Hypertensive heart disease with heart failure: Secondary | ICD-10-CM | POA: Diagnosis not present

## 2017-11-23 DIAGNOSIS — J479 Bronchiectasis, uncomplicated: Secondary | ICD-10-CM | POA: Diagnosis not present

## 2017-11-23 DIAGNOSIS — I4891 Unspecified atrial fibrillation: Secondary | ICD-10-CM | POA: Diagnosis not present

## 2017-11-23 DIAGNOSIS — M1990 Unspecified osteoarthritis, unspecified site: Secondary | ICD-10-CM | POA: Diagnosis not present

## 2017-11-23 DIAGNOSIS — I509 Heart failure, unspecified: Secondary | ICD-10-CM | POA: Diagnosis not present

## 2017-11-23 DIAGNOSIS — F0390 Unspecified dementia without behavioral disturbance: Secondary | ICD-10-CM | POA: Diagnosis not present

## 2017-11-25 DIAGNOSIS — I509 Heart failure, unspecified: Secondary | ICD-10-CM | POA: Diagnosis not present

## 2017-11-25 DIAGNOSIS — M1990 Unspecified osteoarthritis, unspecified site: Secondary | ICD-10-CM | POA: Diagnosis not present

## 2017-11-25 DIAGNOSIS — I110 Hypertensive heart disease with heart failure: Secondary | ICD-10-CM | POA: Diagnosis not present

## 2017-11-25 DIAGNOSIS — F0390 Unspecified dementia without behavioral disturbance: Secondary | ICD-10-CM | POA: Diagnosis not present

## 2017-11-25 DIAGNOSIS — F0151 Vascular dementia with behavioral disturbance: Secondary | ICD-10-CM | POA: Diagnosis not present

## 2017-11-25 DIAGNOSIS — J479 Bronchiectasis, uncomplicated: Secondary | ICD-10-CM | POA: Diagnosis not present

## 2017-11-25 DIAGNOSIS — I4891 Unspecified atrial fibrillation: Secondary | ICD-10-CM | POA: Diagnosis not present

## 2017-11-29 DIAGNOSIS — I509 Heart failure, unspecified: Secondary | ICD-10-CM | POA: Diagnosis not present

## 2017-11-29 DIAGNOSIS — M1990 Unspecified osteoarthritis, unspecified site: Secondary | ICD-10-CM | POA: Diagnosis not present

## 2017-11-29 DIAGNOSIS — F0390 Unspecified dementia without behavioral disturbance: Secondary | ICD-10-CM | POA: Diagnosis not present

## 2017-11-29 DIAGNOSIS — I4891 Unspecified atrial fibrillation: Secondary | ICD-10-CM | POA: Diagnosis not present

## 2017-11-29 DIAGNOSIS — J479 Bronchiectasis, uncomplicated: Secondary | ICD-10-CM | POA: Diagnosis not present

## 2017-11-29 DIAGNOSIS — I110 Hypertensive heart disease with heart failure: Secondary | ICD-10-CM | POA: Diagnosis not present

## 2017-11-30 ENCOUNTER — Ambulatory Visit (INDEPENDENT_AMBULATORY_CARE_PROVIDER_SITE_OTHER): Payer: Medicare Other | Admitting: Internal Medicine

## 2017-11-30 ENCOUNTER — Encounter: Payer: Self-pay | Admitting: Internal Medicine

## 2017-11-30 VITALS — BP 136/68 | HR 64 | Ht 66.0 in | Wt 134.8 lb

## 2017-11-30 DIAGNOSIS — I482 Chronic atrial fibrillation, unspecified: Secondary | ICD-10-CM

## 2017-11-30 DIAGNOSIS — M1990 Unspecified osteoarthritis, unspecified site: Secondary | ICD-10-CM | POA: Diagnosis not present

## 2017-11-30 DIAGNOSIS — I495 Sick sinus syndrome: Secondary | ICD-10-CM | POA: Diagnosis not present

## 2017-11-30 DIAGNOSIS — J479 Bronchiectasis, uncomplicated: Secondary | ICD-10-CM | POA: Diagnosis not present

## 2017-11-30 DIAGNOSIS — I110 Hypertensive heart disease with heart failure: Secondary | ICD-10-CM | POA: Diagnosis not present

## 2017-11-30 DIAGNOSIS — I509 Heart failure, unspecified: Secondary | ICD-10-CM | POA: Diagnosis not present

## 2017-11-30 DIAGNOSIS — I25810 Atherosclerosis of coronary artery bypass graft(s) without angina pectoris: Secondary | ICD-10-CM

## 2017-11-30 DIAGNOSIS — I4891 Unspecified atrial fibrillation: Secondary | ICD-10-CM | POA: Diagnosis not present

## 2017-11-30 DIAGNOSIS — Z951 Presence of aortocoronary bypass graft: Secondary | ICD-10-CM | POA: Diagnosis not present

## 2017-11-30 DIAGNOSIS — R5381 Other malaise: Secondary | ICD-10-CM | POA: Diagnosis not present

## 2017-11-30 DIAGNOSIS — F0390 Unspecified dementia without behavioral disturbance: Secondary | ICD-10-CM | POA: Diagnosis not present

## 2017-11-30 DIAGNOSIS — F0151 Vascular dementia with behavioral disturbance: Secondary | ICD-10-CM | POA: Diagnosis not present

## 2017-11-30 DIAGNOSIS — Z950 Presence of cardiac pacemaker: Secondary | ICD-10-CM | POA: Diagnosis not present

## 2017-11-30 LAB — CUP PACEART INCLINIC DEVICE CHECK
Battery Remaining Longevity: 107 mo
Implantable Lead Implant Date: 20070905
Implantable Lead Implant Date: 20070905
Implantable Lead Location: 753859
Implantable Lead Model: 5076
Implantable Pulse Generator Implant Date: 20150826
Lead Channel Pacing Threshold Amplitude: 0.75 V
Lead Channel Pacing Threshold Amplitude: 0.875 V
Lead Channel Pacing Threshold Pulse Width: 0.4 ms
Lead Channel Pacing Threshold Pulse Width: 0.4 ms
Lead Channel Sensing Intrinsic Amplitude: 11.2 mV
Lead Channel Setting Pacing Pulse Width: 0.4 ms
MDC IDC LEAD LOCATION: 753860
MDC IDC MSMT BATTERY IMPEDANCE: 302 Ohm
MDC IDC MSMT BATTERY VOLTAGE: 2.78 V
MDC IDC MSMT LEADCHNL RA IMPEDANCE VALUE: 67 Ohm
MDC IDC MSMT LEADCHNL RV IMPEDANCE VALUE: 496 Ohm
MDC IDC SESS DTM: 20181212133615
MDC IDC SET LEADCHNL RV PACING AMPLITUDE: 2 V
MDC IDC SET LEADCHNL RV SENSING SENSITIVITY: 2.8 mV
MDC IDC STAT BRADY RV PERCENT PACED: 96 %

## 2017-11-30 NOTE — Patient Instructions (Signed)
Medication Instructions:  Your physician recommends that you continue on your current medications as directed. Please refer to the Current Medication list given to you today.  Labwork: None ordered.  Testing/Procedures: None ordered.  Follow-Up: Your physician wants you to follow-up in: 6 months with the device clinic for a manual device check.   You will receive a reminder letter in the mail two months in advance. If you don't receive a letter, please call our office to schedule the follow-up appointment.  Your physician wants you to follow-up in: one year with Dr. Ladona Ridgelaylor.   You will receive a reminder letter in the mail two months in advance. If you don't receive a letter, please call our office to schedule the follow-up appointment.   Any Other Special Instructions Will Be Listed Below (If Applicable).     If you need a refill on your cardiac medications before your next appointment, please call your pharmacy.

## 2017-11-30 NOTE — Telephone Encounter (Signed)
Routing to stefannie and Dr Yetta Barrejones, fyi..patient will now be seeing Dr. Lauraine RinneScott Sheldon, provider at assisted living facility

## 2017-11-30 NOTE — Progress Notes (Signed)
HPI [name] returns today for ongoing evaluation and management of chronic atrial fibrillation, complete heart block, status post permanent pacemaker insertion.  In the interim, he is moved to a nursing home.  He requires care there.  He denies any recent falls, syncope, chest pain, or shortness of breath.  He has very mild peripheral edema.  He does not complain of back pain today.  He does not have palpitations. Allergies  Allergen Reactions  . Lactose Intolerance (Gi) Other (See Comments)    Unknown reaction (serious per son)  . Asparaginase Derivatives Other (See Comments)    Makes jittery & causes vision problems, headaches  . Doxycycline Hyclate Diarrhea and Nausea And Vomiting  . Erythromycin Diarrhea and Nausea And Vomiting  . Tetracycline Diarrhea and Nausea And Vomiting  . Amprenavir   . Coumadin [Warfarin Sodium]   . Gabapentin   . Ambien [Zolpidem Tartrate] Other (See Comments)    Makes him "mean as a snake"  . Caffeine Other (See Comments)    Interrupts sleep  . Clindamycin Other (See Comments)    REACTION: upset stomach  . Lyrica [Pregabalin] Other (See Comments)    Made him "mean as a snake"  . Metronidazole Itching and Rash  . Nitrostat [Nitroglycerin] Diarrhea  . Penicillins Hives    Has patient had a PCN reaction causing immediate rash, facial/tongue/throat swelling, SOB or lightheadedness with hypotension: Yes Has patient had a PCN reaction causing severe rash involving mucus membranes or skin necrosis: No Has patient had a PCN reaction that required hospitalization No Has patient had a PCN reaction occurring within the last 10 years: No If all of the above answers are "NO", then may proceed with Cephalosporin use.   . Sulfonamide Derivatives Hives     Current Outpatient Medications  Medication Sig Dispense Refill  . apixaban (ELIQUIS) 5 MG TABS tablet Take 1 tablet (5 mg total) by mouth 2 (two) times daily. 180 tablet 3  . b complex vitamins tablet  Take 1 tablet by mouth daily.    . cholecalciferol (VITAMIN D) 1000 units tablet Take 3,000 Units by mouth daily.    . cyanocobalamin (,VITAMIN B-12,) 1000 MCG/ML injection Inject 1,000 mcg into the muscle every 30 (thirty) days.     [name] Kitchen denosumab (PROLIA) 60 MG/ML SOLN injection Inject 60 mg into the skin every 6 (six) months. Administer in upper arm, thigh, or abdomen    . divalproex (DEPAKOTE SPRINKLE) 125 MG capsule Take 250 mg by mouth 2 (two) times daily.    [name] Kitchen ENSURE PLUS (ENSURE PLUS) LIQD Take 1 Can by mouth 2 (two) times daily between meals.    . finasteride (PROSCAR) 5 MG tablet Take 1 tablet (5 mg total) by mouth at bedtime. 90 tablet 3  . LORazepam (ATIVAN) 0.5 MG tablet Take 0.5 mg by mouth every 8 (eight) hours as needed for anxiety (Behavior disturbances).    . metoprolol tartrate (LOPRESSOR) 25 MG tablet Take 1 tablet (25 mg total) by mouth 2 (two) times daily. 180 tablet 3  . nitroGLYCERIN (NITROSTAT) 0.4 MG SL tablet Place 1 tablet (0.4 mg total) under the tongue every 5 (five) minutes x 3 doses as needed for chest pain. 25 tablet 1  . omeprazole (PRILOSEC) 20 MG capsule Take 20 mg by mouth daily.    . QUEtiapine (SEROQUEL) 25 MG tablet Take 25 mg by mouth 2 (two) times daily.    . risperiDONE (RISPERDAL) 0.5 MG tablet Take 0.5 mg by mouth  2 (two) times daily.    . rivastigmine (EXELON) 6 MG capsule Take 6 mg by mouth 2 (two) times daily.    [name] Kitchen. terazosin (HYTRIN) 5 MG capsule Take 1 capsule (5 mg total) by mouth daily. 90 capsule 3  . traZODone (DESYREL) 50 MG tablet TAKE 1/2 TABLET (25 MG TOTAL) BY MOUTH DAILY AS NEEDED FOR IMSOMNIA     Current Facility-Administered Medications  Medication Dose Route Frequency Provider Last Rate Last Dose  . denosumab (PROLIA) injection 60 mg  60 mg Subcutaneous Q6 months Etta GrandchildJones, Thomas L, MD   60 mg at 04/07/17 1618     Past Medical History:  Diagnosis Date  . Allergic rhinitis, cause unspecified   . Arthritis   . Atrial fibrillation (HCC)     a. Was on Coumadin until GIB 01/2014.  . Bronchiectasis without acute exacerbation (HCC)   . Coronary artery disease    a. s/p CABG 1993. b. s/p PCI of SVG to OM '03.  . Dementia 07/21/2017   Per son report  . Disorders of diaphragm   . Diverticulosis   . Dorsalgia   . Epistaxis   . GERD (gastroesophageal reflux disease)   . H/O: GI bleed    a. 01/2014 - presumed diverticular. Received PRBC, Vit K. Coumadin put on hold.  . Hyperlipidemia   . Hypertension   . Insomnia   . LV dysfunction    a. Echo 08/2013: EF 45-50%.  . Myocardial infarction (HCC)   . Osteoporosis   . Plantar fascial fibromatosis   . Spinal stenosis, unspecified region other than cervical   . Tachy-brady syndrome [facility](HCC)    s/p Medtronic PPM '07  . Unspecified sinusitis (chronic)     ROS:   All systems reviewed and negative except as noted in the HPI.   Past Surgical History:  Procedure Laterality Date  . CATARACT EXTRACTION W/ INTRAOCULAR LENS  IMPLANT, BILATERAL    . CHOLECYSTECTOMY    . COLONOSCOPY    . CORONARY ARTERY BYPASS GRAFT     LIMA to LAD, SVG to OM, SVG to left circumflex, SVG to PD/PLSA  . DOPPLER ECHOCARDIOGRAPHY  2011  . HIP ARTHROPLASTY Right 12/10/2013   Procedure: Right Hip Cemented Hemiarthroplasty;  Surgeon: Eldred MangesMark C Yates, MD;  [facility];  Service: Orthopedics;  Laterality: Right;  Right Hip Cemented Hemiarthroplasty  . KIDNEY CYST REMOVAL    . PACEMAKER INSERTION     2007, Medtronic dual-chamber  . PERMANENT PACEMAKER GENERATOR CHANGE N/A 08/14/2014   Procedure: PERMANENT PACEMAKER GENERATOR CHANGE;  Surgeon: Marinus MawGregg W Faithe Ariola, MD;  [facility];  Service: Cardiovascular;  Laterality: N/A;  . VERTEBROPLASTY       Family History  Problem Relation Age of Onset  . Stroke Mother   . Diabetes Son   . Cancer Son   . Colon cancer Neg Hx   . Heart disease Neg Hx   . Throat cancer Neg Hx   . Pancreatic cancer Neg Hx   . Kidney disease Neg Hx   . Liver disease Neg Hx     . Osteoporosis Neg Hx      Social History   Socioeconomic History  . Marital status: Married    Spouse name: Ollen GrossMarjorie  . Number of children: 4  . Years of education: Not on file  . Highest education level: Not on file  Social Needs  . Financial resource strain: Not on file  . Food insecurity - worry: Not on file  .  Food insecurity - inability: Not on file  . Transportation needs - medical: Not on file  . Transportation needs - non-medical: Not on file  Occupational History    Employer: RETIRED  Tobacco Use  . Smoking status: Former Games developermoker  . Smokeless tobacco: Never Used  . Tobacco comment: x 6 months in high school  Substance and Sexual Activity  . Alcohol use: No    Comment: none  . Drug use: No  . Sexual activity: No  Other Topics Concern  . Not on file  Social History Narrative   Married.  Ambulates with a walker.     BP 136/68   Pulse 64   Ht 5\' 6"  (1.676 m)   Wt 134 lb 12.8 oz (61.1 kg)   SpO2 98%   BMI 21.76 kg/m   Physical Exam:  Elderly appearing man, NAD HEENT: Unremarkable Neck: 6 cm JVD, no thyromegally Lymphatics:  No adenopathy Back:  No CVA tenderness Lungs:  Clear, with minimal scattered basilar rales, no rhonchi, no wheezes.  No increased work of breathing. HEART:  IRegular rate rhythm, no murmurs, no rubs, no clicks Abd:  soft, positive bowel sounds, no organomegally, no rebound, no guarding Ext:  2 plus pulses, no edema, no cyanosis, no clubbing Skin:  No rashes no nodules Neuro:  CN II through XII intact, motor grossly intact   DEVICE  Normal device function.  See PaceArt for details.   Assess/Plan: 1.  Atrial fibrillation -his ventricular rate is reasonably well controlled.  He is pacing 96% of the time.  He is asymptomatic.  He is not a candidate for anticoagulation with his history of falls. 2.  Pacemaker -his Medtronic single-chamber pacemaker is working normally.  He has approximately 9 years of battery longevity. 3.   Hypertension -his blood pressure is minimally elevated today.  I would not be too aggressive in trying to lower his blood pressure with his history of falling.  He is at risk for orthostasis.  Sharrell KuGreg Anah Billard, MD

## 2017-12-05 DIAGNOSIS — I509 Heart failure, unspecified: Secondary | ICD-10-CM | POA: Diagnosis not present

## 2017-12-05 DIAGNOSIS — I110 Hypertensive heart disease with heart failure: Secondary | ICD-10-CM | POA: Diagnosis not present

## 2017-12-05 DIAGNOSIS — M1990 Unspecified osteoarthritis, unspecified site: Secondary | ICD-10-CM | POA: Diagnosis not present

## 2017-12-05 DIAGNOSIS — F0390 Unspecified dementia without behavioral disturbance: Secondary | ICD-10-CM | POA: Diagnosis not present

## 2017-12-05 DIAGNOSIS — J479 Bronchiectasis, uncomplicated: Secondary | ICD-10-CM | POA: Diagnosis not present

## 2017-12-05 DIAGNOSIS — I4891 Unspecified atrial fibrillation: Secondary | ICD-10-CM | POA: Diagnosis not present

## 2017-12-06 DIAGNOSIS — I4891 Unspecified atrial fibrillation: Secondary | ICD-10-CM | POA: Diagnosis not present

## 2017-12-06 DIAGNOSIS — I509 Heart failure, unspecified: Secondary | ICD-10-CM | POA: Diagnosis not present

## 2017-12-06 DIAGNOSIS — F0390 Unspecified dementia without behavioral disturbance: Secondary | ICD-10-CM | POA: Diagnosis not present

## 2017-12-06 DIAGNOSIS — J479 Bronchiectasis, uncomplicated: Secondary | ICD-10-CM | POA: Diagnosis not present

## 2017-12-06 DIAGNOSIS — I110 Hypertensive heart disease with heart failure: Secondary | ICD-10-CM | POA: Diagnosis not present

## 2017-12-06 DIAGNOSIS — M1990 Unspecified osteoarthritis, unspecified site: Secondary | ICD-10-CM | POA: Diagnosis not present

## 2017-12-08 DIAGNOSIS — J479 Bronchiectasis, uncomplicated: Secondary | ICD-10-CM | POA: Diagnosis not present

## 2017-12-08 DIAGNOSIS — M1990 Unspecified osteoarthritis, unspecified site: Secondary | ICD-10-CM | POA: Diagnosis not present

## 2017-12-08 DIAGNOSIS — F0390 Unspecified dementia without behavioral disturbance: Secondary | ICD-10-CM | POA: Diagnosis not present

## 2017-12-08 DIAGNOSIS — I110 Hypertensive heart disease with heart failure: Secondary | ICD-10-CM | POA: Diagnosis not present

## 2017-12-08 DIAGNOSIS — I4891 Unspecified atrial fibrillation: Secondary | ICD-10-CM | POA: Diagnosis not present

## 2017-12-08 DIAGNOSIS — I509 Heart failure, unspecified: Secondary | ICD-10-CM | POA: Diagnosis not present

## 2017-12-09 DIAGNOSIS — I4891 Unspecified atrial fibrillation: Secondary | ICD-10-CM | POA: Diagnosis not present

## 2017-12-09 DIAGNOSIS — F0390 Unspecified dementia without behavioral disturbance: Secondary | ICD-10-CM | POA: Diagnosis not present

## 2017-12-09 DIAGNOSIS — M1990 Unspecified osteoarthritis, unspecified site: Secondary | ICD-10-CM | POA: Diagnosis not present

## 2017-12-09 DIAGNOSIS — I110 Hypertensive heart disease with heart failure: Secondary | ICD-10-CM | POA: Diagnosis not present

## 2017-12-09 DIAGNOSIS — I509 Heart failure, unspecified: Secondary | ICD-10-CM | POA: Diagnosis not present

## 2017-12-09 DIAGNOSIS — J479 Bronchiectasis, uncomplicated: Secondary | ICD-10-CM | POA: Diagnosis not present

## 2017-12-12 DIAGNOSIS — M1990 Unspecified osteoarthritis, unspecified site: Secondary | ICD-10-CM | POA: Diagnosis not present

## 2017-12-12 DIAGNOSIS — I110 Hypertensive heart disease with heart failure: Secondary | ICD-10-CM | POA: Diagnosis not present

## 2017-12-12 DIAGNOSIS — J479 Bronchiectasis, uncomplicated: Secondary | ICD-10-CM | POA: Diagnosis not present

## 2017-12-12 DIAGNOSIS — F0390 Unspecified dementia without behavioral disturbance: Secondary | ICD-10-CM | POA: Diagnosis not present

## 2017-12-12 DIAGNOSIS — I509 Heart failure, unspecified: Secondary | ICD-10-CM | POA: Diagnosis not present

## 2017-12-12 DIAGNOSIS — I4891 Unspecified atrial fibrillation: Secondary | ICD-10-CM | POA: Diagnosis not present

## 2017-12-14 DIAGNOSIS — I4891 Unspecified atrial fibrillation: Secondary | ICD-10-CM | POA: Diagnosis not present

## 2017-12-14 DIAGNOSIS — F0390 Unspecified dementia without behavioral disturbance: Secondary | ICD-10-CM | POA: Diagnosis not present

## 2017-12-14 DIAGNOSIS — I110 Hypertensive heart disease with heart failure: Secondary | ICD-10-CM | POA: Diagnosis not present

## 2017-12-14 DIAGNOSIS — J479 Bronchiectasis, uncomplicated: Secondary | ICD-10-CM | POA: Diagnosis not present

## 2017-12-14 DIAGNOSIS — M1990 Unspecified osteoarthritis, unspecified site: Secondary | ICD-10-CM | POA: Diagnosis not present

## 2017-12-14 DIAGNOSIS — I509 Heart failure, unspecified: Secondary | ICD-10-CM | POA: Diagnosis not present

## 2017-12-15 NOTE — Progress Notes (Signed)
Office Visit Note  Patient: Dwayne FactorLarry E Bennett             Date of Birth: [birth year]           MRN: [id number]             PCP: No primary care provider on file. Referring: Etta GrandchildJones, Thomas L, MD Visit Date: 12/27/2017 Occupation: @GUAROCC @    Subjective:  Pain of the Right Hip and Medication Management   History of Present Illness: Dwayne [name] is a 83 y.o. male with a history of osteoarthritis and osteoporosis.  Patient is in a wheelchair and accompanied by a nurse.  Mr. Brooke DareKing is very drowsy throughout the exam.  His caregiver provides some of the history.  He reports right hip pain.  His right hip was previously fractured in 2014.  He has significant pain and difficulty attempting to stand.  He denies any nocturnal pain.  He states he has pain in bilateral knees.  He reports hand pain and stiffness.   He continues to have his Prolia injections.     Activities of Daily Living:  Patient reports morning stiffness for all day.   Patient Reports nocturnal pain.  Difficulty dressing/grooming: Reports Difficulty climbing stairs: Reports Difficulty getting out of chair: Reports Difficulty using hands for taps, buttons, cutlery, and/or writing: Reports   Review of Systems  Constitutional: Positive for fatigue. Negative for night sweats and weakness.  HENT: Negative for mouth sores, mouth dryness and nose dryness.   Eyes: Negative for redness and dryness.  Respiratory: Negative for shortness of breath and difficulty breathing.   Cardiovascular: Negative for chest pain, palpitations, hypertension, irregular heartbeat and swelling in legs/feet.  Gastrointestinal: Positive for constipation. Negative for blood in stool and diarrhea.  Endocrine: Negative for increased urination.  Genitourinary: Negative for painful urination.  Musculoskeletal: Positive for arthralgias, joint pain and morning stiffness. Negative for joint swelling, myalgias, muscle weakness, muscle tenderness and myalgias.  Skin:  Negative for color change, rash, hair loss, nodules/bumps, skin tightness, ulcers and sensitivity to sunlight.  Allergic/Immunologic: Negative for susceptible to infections.  Neurological: Negative for dizziness, fainting, memory loss and night sweats.  Hematological: Negative for swollen glands.  Psychiatric/Behavioral: Negative for depressed mood and sleep disturbance. The patient is not nervous/anxious.     PMFS History:  Patient Active Problem List   Diagnosis Date Noted  . Acute metabolic encephalopathy 07/21/2017  . Acute encephalopathy 07/21/2017  . Gastroesophageal reflux disease without esophagitis 01/10/2017  . 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 Postural kyphosis of thoracolumbar region 12/27/2016  . Primary osteoarthritis of both knees 12/27/2016  . Primary osteoarthritis of both hands 12/27/2016  . Dementia 05/06/2016  . Routine general medical examination at a health care facility 03/12/2015  . Insomnia 06/26/2014  . LV dysfunction   . Diverticulosis   . Tachy-brady syndrome (HCC)   . Coronary artery disease   . Anemia, iron deficiency 02/21/2014  . Encounter for therapeutic drug monitoring 01/17/2014  . Osteoporosis 01/04/2014  . Allergic rhinitis 01/04/2014  . Mild mitral regurgitation 02/13/2013  . 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 Constipation, chronic 05/25/2012  . Hyperglycemia 02/28/2012  . Essential hypertension, benign 02/22/2012  . ANEMIA, B12 DEFICIENCY 03/03/2011  . Long term current use of anticoagulant 02/08/2011  . Coronary atherosclerosis of artery bypass graft 08/19/2010  . Cardiac pacemaker in situ 09/02/2009  . Hyperlipidemia with target LDL less than 70 10/26/2007  .  Atrial fibrillation (HCC) 10/26/2007  . BRONCHIECTASIS 10/26/2007  . Low back pain without sciatica 10/26/2007    Past Medical History:  Diagnosis Date  . Allergic rhinitis, cause  unspecified   . Arthritis   . Atrial fibrillation (HCC)    a. Was on Coumadin until GIB 01/2014.  . Bronchiectasis without acute exacerbation (HCC)   . Coronary artery disease    a. s/p CABG 1993. b. s/p PCI of SVG to OM '03.  . Dementia 07/21/2017   Per son report  . Disorders of diaphragm   . Diverticulosis   . Dorsalgia   . Epistaxis   . GERD (gastroesophageal reflux disease)   . H/O: GI bleed    a. 01/2014 - presumed diverticular. Received PRBC, Vit K. Coumadin put on hold.  . Hyperlipidemia   . Hypertension   . Insomnia   . LV dysfunction    a. Echo 08/2013: EF 45-50%.  . Myocardial infarction (HCC)   . Osteoporosis   . Plantar fascial fibromatosis   . Spinal stenosis, unspecified region other than cervical   . Tachy-brady syndrome [facility](HCC)    s/p Medtronic PPM '07  . Unspecified sinusitis (chronic)     Family History  Problem Relation Age of Onset  . Stroke Mother   . Diabetes Son   . Cancer Son   . Colon cancer Neg Hx   . Heart disease Neg Hx   . Throat cancer Neg Hx   . Pancreatic cancer Neg Hx   . Kidney disease Neg Hx   . Liver disease Neg Hx   . Osteoporosis Neg Hx    Past Surgical History:  Procedure Laterality Date  . CATARACT EXTRACTION W/ INTRAOCULAR LENS  IMPLANT, BILATERAL    . CHOLECYSTECTOMY    . COLONOSCOPY    . CORONARY ARTERY BYPASS GRAFT     LIMA to LAD, SVG to OM, SVG to left circumflex, SVG to PD/PLSA  . DOPPLER ECHOCARDIOGRAPHY  2011  . HIP ARTHROPLASTY Right 12/10/2013   Procedure: Right Hip Cemented Hemiarthroplasty;  Surgeon: Eldred MangesMark C Yates, MD;  [facility];  Service: Orthopedics;  Laterality: Right;  Right Hip Cemented Hemiarthroplasty  . KIDNEY CYST REMOVAL    . PACEMAKER INSERTION     2007, Medtronic dual-chamber  . PERMANENT PACEMAKER GENERATOR CHANGE N/A 08/14/2014   Procedure: PERMANENT PACEMAKER GENERATOR CHANGE;  Surgeon: Marinus MawGregg W Taylor, MD;  [facility];  Service: Cardiovascular;  Laterality: N/A;  . VERTEBROPLASTY      Social History   Social History Narrative   Married.  Ambulates with a walker.     Objective: Vital Signs: BP 126/70 (BP Location: Left Arm, Patient Position: Sitting, Cuff Size: Small)   Pulse 67   Resp 14    Physical Exam  Constitutional: He is oriented to person, place, and time. He appears well-developed and well-nourished.  HENT:  Head: Normocephalic and atraumatic.  Eyes: Conjunctivae and EOM are normal. Pupils are equal, round, and reactive to light.  Neck: Normal range of motion. Neck supple.  Cardiovascular: Normal rate, regular rhythm and normal heart sounds.  Pulmonary/Chest: Effort normal and breath sounds normal.  Abdominal: Soft. Bowel sounds are normal.  Neurological: He is alert and oriented to person, place, and time.  Skin: Skin is warm and dry. Capillary refill takes less than 2 seconds.  Psychiatric: He has a normal mood and affect. His behavior is normal.  Nursing note and vitals reviewed.    Musculoskeletal Exam: He was in a  wheelchair during the entire exam.  He has significant thoracic kyphosis.  He has tenderness along the midline spine.  He was unable to follow commands, so we are unable to assess ROM. PIP and DIP synovial thickening. No synovitis on exam. No knee effusion or warmth. Right trochanteric and ischial bursitis.    CDAI Exam: No CDAI exam completed.    Investigation: No additional findings. CBC Latest Ref Rng & Units 07/22/2017 07/21/2017 01/04/2017  WBC 4.0 - 10.5 K/uL 8.5 9.6 9.9  Hemoglobin 13.0 - 17.0 g/dL 29.5 62.1 30.8  Hematocrit 39.0 - 52.0 % 40.2 40.5 45.2  Platelets 150 - 400 K/uL 209 205 188.0   CMP Latest Ref Rng & Units 07/22/2017 07/21/2017 01/04/2017  Glucose 65 - 99 mg/dL 88 657(Q) 89  BUN 6 - 20 mg/dL 18 13 18   Creatinine 0.61 - 1.24 mg/dL 4.69 6.29 5.28  Sodium 135 - 145 mmol/L 139 139 139  Potassium 3.5 - 5.1 mmol/L 3.6 3.6 5.4(H)  Chloride 101 - 111 mmol/L 107 107 107  CO2 22 - 32 mmol/L 24 25 25   Calcium 8.9 - 10.3  mg/dL 9.0 9.0 9.4  Total Protein 6.5 - 8.1 g/dL - 6.1(L) 7.2  Total Bilirubin 0.3 - 1.2 mg/dL - 0.5 0.4  Alkaline Phos 38 - 126 U/L - 59 70  AST 15 - 41 U/L - 17 20  ALT 17 - 63 U/L - 10(L) 10    Imaging: No results found.  Speciality Comments: No specialty comments available.    Procedures:  No procedures performed Allergies: Lactose intolerance (gi); Asparaginase derivatives; Doxycycline hyclate; Erythromycin; Tetracycline; Amprenavir; Coumadin [warfarin sodium]; Gabapentin; Ambien [zolpidem tartrate]; Caffeine; Clindamycin; Lyrica [pregabalin]; Metronidazole; Nitrostat [nitroglycerin]; Penicillins; and Sulfonamide derivatives   Assessment / Plan:     Visit Diagnoses: Primary osteoarthritis of both hands: PIP and DIP synovial thickening consistent with osteoarthritis.  No synovitis on exam.   Primary osteoarthritis of both knees: Chronic pain.  Patient was in wheelchair during entire exam.  He was unable to follow ROM commands.  DDD (degenerative disc disease), lumbar: He is in significant discomfort.    Postural kyphosis of thoracolumbar region  Spondylosis of lumbar region without myelopathy or radiculopathy  Trochanteric bursitis, right hip: He has trochanteric bursitis and ischial bursitis of his right hip. Advised caregiver to use cushions to prevent decubitus ulcers.     Age-related osteoporosis without current pathological fracture - caregiver reports he is still getting his Prolia injections.    History of vertebral fracture - status post kyphoplasty  History of hip fracture - Right hip fracture December 2014.  He continues to have significant pain in his right hip.  He has radiation of pain to his groin and trochanteric bursitis.    Other medical conditions are listed as follows:   Primary insomnia: Resolved   History of hyperlipidemia  History of atrial fibrillation  History of anticoagulant therapy - For atrial fibrillation  Atherosclerosis of coronary  artery bypass graft of native heart with unstable angina pectoris (HCC)    Orders: No orders of the defined types were placed in this encounter.  No orders of the defined types were placed in this encounter.   Follow-Up Instructions: Return if symptoms worsen or fail to improve, for Osteoarthritis.   Pollyann Savoy, MD  Note - This record has been created using Animal nutritionist.  Chart creation errors have been sought, but may not always  have been located. Such creation errors do not reflect on  the standard  of medical care.

## 2017-12-16 DIAGNOSIS — J479 Bronchiectasis, uncomplicated: Secondary | ICD-10-CM | POA: Diagnosis not present

## 2017-12-16 DIAGNOSIS — R198 Other specified symptoms and signs involving the digestive system and abdomen: Secondary | ICD-10-CM | POA: Diagnosis not present

## 2017-12-16 DIAGNOSIS — F0390 Unspecified dementia without behavioral disturbance: Secondary | ICD-10-CM | POA: Diagnosis not present

## 2017-12-16 DIAGNOSIS — I509 Heart failure, unspecified: Secondary | ICD-10-CM | POA: Diagnosis not present

## 2017-12-16 DIAGNOSIS — I4891 Unspecified atrial fibrillation: Secondary | ICD-10-CM | POA: Diagnosis not present

## 2017-12-16 DIAGNOSIS — I110 Hypertensive heart disease with heart failure: Secondary | ICD-10-CM | POA: Diagnosis not present

## 2017-12-16 DIAGNOSIS — R109 Unspecified abdominal pain: Secondary | ICD-10-CM | POA: Diagnosis not present

## 2017-12-16 DIAGNOSIS — M1990 Unspecified osteoarthritis, unspecified site: Secondary | ICD-10-CM | POA: Diagnosis not present

## 2017-12-17 DIAGNOSIS — J479 Bronchiectasis, uncomplicated: Secondary | ICD-10-CM | POA: Diagnosis not present

## 2017-12-17 DIAGNOSIS — F0390 Unspecified dementia without behavioral disturbance: Secondary | ICD-10-CM | POA: Diagnosis not present

## 2017-12-17 DIAGNOSIS — M1990 Unspecified osteoarthritis, unspecified site: Secondary | ICD-10-CM | POA: Diagnosis not present

## 2017-12-17 DIAGNOSIS — I110 Hypertensive heart disease with heart failure: Secondary | ICD-10-CM | POA: Diagnosis not present

## 2017-12-17 DIAGNOSIS — I4891 Unspecified atrial fibrillation: Secondary | ICD-10-CM | POA: Diagnosis not present

## 2017-12-17 DIAGNOSIS — I509 Heart failure, unspecified: Secondary | ICD-10-CM | POA: Diagnosis not present

## 2017-12-18 DIAGNOSIS — I4891 Unspecified atrial fibrillation: Secondary | ICD-10-CM | POA: Diagnosis not present

## 2017-12-18 DIAGNOSIS — I672 Cerebral atherosclerosis: Secondary | ICD-10-CM | POA: Diagnosis not present

## 2017-12-18 DIAGNOSIS — Z880 Allergy status to penicillin: Secondary | ICD-10-CM | POA: Diagnosis not present

## 2017-12-18 DIAGNOSIS — Z79899 Other long term (current) drug therapy: Secondary | ICD-10-CM | POA: Diagnosis not present

## 2017-12-18 DIAGNOSIS — I10 Essential (primary) hypertension: Secondary | ICD-10-CM | POA: Diagnosis not present

## 2017-12-18 DIAGNOSIS — I110 Hypertensive heart disease with heart failure: Secondary | ICD-10-CM | POA: Diagnosis not present

## 2017-12-18 DIAGNOSIS — Z7901 Long term (current) use of anticoagulants: Secondary | ICD-10-CM | POA: Diagnosis not present

## 2017-12-18 DIAGNOSIS — R402441 Other coma, without documented Glasgow coma scale score, or with partial score reported, in the field [EMT or ambulance]: Secondary | ICD-10-CM | POA: Diagnosis not present

## 2017-12-18 DIAGNOSIS — Z87891 Personal history of nicotine dependence: Secondary | ICD-10-CM | POA: Diagnosis not present

## 2017-12-18 DIAGNOSIS — Z7982 Long term (current) use of aspirin: Secondary | ICD-10-CM | POA: Diagnosis not present

## 2017-12-18 DIAGNOSIS — F0391 Unspecified dementia with behavioral disturbance: Secondary | ICD-10-CM | POA: Diagnosis not present

## 2017-12-18 DIAGNOSIS — F0390 Unspecified dementia without behavioral disturbance: Secondary | ICD-10-CM | POA: Diagnosis not present

## 2017-12-18 DIAGNOSIS — Z888 Allergy status to other drugs, medicaments and biological substances status: Secondary | ICD-10-CM | POA: Diagnosis not present

## 2017-12-18 DIAGNOSIS — Z881 Allergy status to other antibiotic agents status: Secondary | ICD-10-CM | POA: Diagnosis not present

## 2017-12-18 DIAGNOSIS — R918 Other nonspecific abnormal finding of lung field: Secondary | ICD-10-CM | POA: Diagnosis not present

## 2017-12-18 DIAGNOSIS — Z883 Allergy status to other anti-infective agents status: Secondary | ICD-10-CM | POA: Diagnosis not present

## 2017-12-18 DIAGNOSIS — E785 Hyperlipidemia, unspecified: Secondary | ICD-10-CM | POA: Diagnosis not present

## 2017-12-18 DIAGNOSIS — R9431 Abnormal electrocardiogram [ECG] [EKG]: Secondary | ICD-10-CM | POA: Diagnosis not present

## 2017-12-18 DIAGNOSIS — R0989 Other specified symptoms and signs involving the circulatory and respiratory systems: Secondary | ICD-10-CM | POA: Diagnosis not present

## 2017-12-18 DIAGNOSIS — R4182 Altered mental status, unspecified: Secondary | ICD-10-CM | POA: Diagnosis not present

## 2017-12-18 DIAGNOSIS — Z885 Allergy status to narcotic agent status: Secondary | ICD-10-CM | POA: Diagnosis not present

## 2017-12-18 DIAGNOSIS — I509 Heart failure, unspecified: Secondary | ICD-10-CM | POA: Diagnosis not present

## 2017-12-18 DIAGNOSIS — Z950 Presence of cardiac pacemaker: Secondary | ICD-10-CM | POA: Diagnosis not present

## 2017-12-19 DIAGNOSIS — J479 Bronchiectasis, uncomplicated: Secondary | ICD-10-CM | POA: Diagnosis not present

## 2017-12-19 DIAGNOSIS — L97829 Non-pressure chronic ulcer of other part of left lower leg with unspecified severity: Secondary | ICD-10-CM | POA: Diagnosis not present

## 2017-12-19 DIAGNOSIS — I509 Heart failure, unspecified: Secondary | ICD-10-CM | POA: Diagnosis not present

## 2017-12-19 DIAGNOSIS — F0390 Unspecified dementia without behavioral disturbance: Secondary | ICD-10-CM | POA: Diagnosis not present

## 2017-12-19 DIAGNOSIS — Z7409 Other reduced mobility: Secondary | ICD-10-CM | POA: Diagnosis not present

## 2017-12-19 DIAGNOSIS — R4182 Altered mental status, unspecified: Secondary | ICD-10-CM | POA: Diagnosis not present

## 2017-12-19 DIAGNOSIS — I4891 Unspecified atrial fibrillation: Secondary | ICD-10-CM | POA: Diagnosis not present

## 2017-12-19 DIAGNOSIS — I110 Hypertensive heart disease with heart failure: Secondary | ICD-10-CM | POA: Diagnosis not present

## 2017-12-20 DIAGNOSIS — S0990XA Unspecified injury of head, initial encounter: Secondary | ICD-10-CM | POA: Diagnosis not present

## 2017-12-20 DIAGNOSIS — Z880 Allergy status to penicillin: Secondary | ICD-10-CM | POA: Diagnosis not present

## 2017-12-20 DIAGNOSIS — M47816 Spondylosis without myelopathy or radiculopathy, lumbar region: Secondary | ICD-10-CM | POA: Diagnosis not present

## 2017-12-20 DIAGNOSIS — Z7982 Long term (current) use of aspirin: Secondary | ICD-10-CM | POA: Diagnosis not present

## 2017-12-20 DIAGNOSIS — W19XXXA Unspecified fall, initial encounter: Secondary | ICD-10-CM | POA: Diagnosis not present

## 2017-12-20 DIAGNOSIS — I110 Hypertensive heart disease with heart failure: Secondary | ICD-10-CM | POA: Diagnosis not present

## 2017-12-20 DIAGNOSIS — E785 Hyperlipidemia, unspecified: Secondary | ICD-10-CM | POA: Diagnosis not present

## 2017-12-20 DIAGNOSIS — F0390 Unspecified dementia without behavioral disturbance: Secondary | ICD-10-CM | POA: Diagnosis not present

## 2017-12-20 DIAGNOSIS — Z79899 Other long term (current) drug therapy: Secondary | ICD-10-CM | POA: Diagnosis not present

## 2017-12-20 DIAGNOSIS — M25561 Pain in right knee: Secondary | ICD-10-CM | POA: Diagnosis not present

## 2017-12-20 DIAGNOSIS — S79911A Unspecified injury of right hip, initial encounter: Secondary | ICD-10-CM | POA: Diagnosis not present

## 2017-12-20 DIAGNOSIS — M4856XA Collapsed vertebra, not elsewhere classified, lumbar region, initial encounter for fracture: Secondary | ICD-10-CM | POA: Diagnosis not present

## 2017-12-20 DIAGNOSIS — Z888 Allergy status to other drugs, medicaments and biological substances status: Secondary | ICD-10-CM | POA: Diagnosis not present

## 2017-12-20 DIAGNOSIS — Z981 Arthrodesis status: Secondary | ICD-10-CM | POA: Diagnosis not present

## 2017-12-20 DIAGNOSIS — M50323 Other cervical disc degeneration at C6-C7 level: Secondary | ICD-10-CM | POA: Diagnosis not present

## 2017-12-20 DIAGNOSIS — M25511 Pain in right shoulder: Secondary | ICD-10-CM | POA: Diagnosis not present

## 2017-12-20 DIAGNOSIS — M419 Scoliosis, unspecified: Secondary | ICD-10-CM | POA: Diagnosis not present

## 2017-12-20 DIAGNOSIS — Z881 Allergy status to other antibiotic agents status: Secondary | ICD-10-CM | POA: Diagnosis not present

## 2017-12-20 DIAGNOSIS — I10 Essential (primary) hypertension: Secondary | ICD-10-CM | POA: Diagnosis not present

## 2017-12-20 DIAGNOSIS — S72111A Displaced fracture of greater trochanter of right femur, initial encounter for closed fracture: Secondary | ICD-10-CM | POA: Diagnosis not present

## 2017-12-20 DIAGNOSIS — G319 Degenerative disease of nervous system, unspecified: Secondary | ICD-10-CM | POA: Diagnosis not present

## 2017-12-20 DIAGNOSIS — R9082 White matter disease, unspecified: Secondary | ICD-10-CM | POA: Diagnosis not present

## 2017-12-20 DIAGNOSIS — Z7409 Other reduced mobility: Secondary | ICD-10-CM | POA: Diagnosis not present

## 2017-12-20 DIAGNOSIS — T148XXA Other injury of unspecified body region, initial encounter: Secondary | ICD-10-CM | POA: Diagnosis not present

## 2017-12-20 DIAGNOSIS — S3992XA Unspecified injury of lower back, initial encounter: Secondary | ICD-10-CM | POA: Diagnosis not present

## 2017-12-20 DIAGNOSIS — I4891 Unspecified atrial fibrillation: Secondary | ICD-10-CM | POA: Diagnosis not present

## 2017-12-20 DIAGNOSIS — S32020A Wedge compression fracture of second lumbar vertebra, initial encounter for closed fracture: Secondary | ICD-10-CM | POA: Diagnosis not present

## 2017-12-20 DIAGNOSIS — Z471 Aftercare following joint replacement surgery: Secondary | ICD-10-CM | POA: Diagnosis not present

## 2017-12-20 DIAGNOSIS — Z7901 Long term (current) use of anticoagulants: Secondary | ICD-10-CM | POA: Diagnosis not present

## 2017-12-20 DIAGNOSIS — M25551 Pain in right hip: Secondary | ICD-10-CM | POA: Diagnosis not present

## 2017-12-20 DIAGNOSIS — Z89621 Acquired absence of right hip joint: Secondary | ICD-10-CM | POA: Diagnosis not present

## 2017-12-20 DIAGNOSIS — M9701XA Periprosthetic fracture around internal prosthetic right hip joint, initial encounter: Secondary | ICD-10-CM | POA: Diagnosis not present

## 2017-12-20 DIAGNOSIS — Z96641 Presence of right artificial hip joint: Secondary | ICD-10-CM | POA: Diagnosis not present

## 2017-12-20 DIAGNOSIS — I509 Heart failure, unspecified: Secondary | ICD-10-CM | POA: Diagnosis not present

## 2017-12-21 DIAGNOSIS — I4891 Unspecified atrial fibrillation: Secondary | ICD-10-CM | POA: Diagnosis not present

## 2017-12-21 DIAGNOSIS — F0391 Unspecified dementia with behavioral disturbance: Secondary | ICD-10-CM | POA: Diagnosis not present

## 2017-12-21 DIAGNOSIS — F0390 Unspecified dementia without behavioral disturbance: Secondary | ICD-10-CM | POA: Diagnosis not present

## 2017-12-21 DIAGNOSIS — L97829 Non-pressure chronic ulcer of other part of left lower leg with unspecified severity: Secondary | ICD-10-CM | POA: Diagnosis not present

## 2017-12-21 DIAGNOSIS — J479 Bronchiectasis, uncomplicated: Secondary | ICD-10-CM | POA: Diagnosis not present

## 2017-12-21 DIAGNOSIS — I509 Heart failure, unspecified: Secondary | ICD-10-CM | POA: Diagnosis not present

## 2017-12-21 DIAGNOSIS — R451 Restlessness and agitation: Secondary | ICD-10-CM | POA: Diagnosis not present

## 2017-12-21 DIAGNOSIS — F0630 Mood disorder due to known physiological condition, unspecified: Secondary | ICD-10-CM | POA: Diagnosis not present

## 2017-12-21 DIAGNOSIS — I110 Hypertensive heart disease with heart failure: Secondary | ICD-10-CM | POA: Diagnosis not present

## 2017-12-21 DIAGNOSIS — F5102 Adjustment insomnia: Secondary | ICD-10-CM | POA: Diagnosis not present

## 2017-12-22 DIAGNOSIS — D519 Vitamin B12 deficiency anemia, unspecified: Secondary | ICD-10-CM | POA: Diagnosis not present

## 2017-12-22 DIAGNOSIS — R6889 Other general symptoms and signs: Secondary | ICD-10-CM | POA: Diagnosis not present

## 2017-12-22 DIAGNOSIS — R7989 Other specified abnormal findings of blood chemistry: Secondary | ICD-10-CM | POA: Diagnosis not present

## 2017-12-22 DIAGNOSIS — Z5181 Encounter for therapeutic drug level monitoring: Secondary | ICD-10-CM | POA: Diagnosis not present

## 2017-12-22 DIAGNOSIS — E559 Vitamin D deficiency, unspecified: Secondary | ICD-10-CM | POA: Diagnosis not present

## 2017-12-23 DIAGNOSIS — L97829 Non-pressure chronic ulcer of other part of left lower leg with unspecified severity: Secondary | ICD-10-CM | POA: Diagnosis not present

## 2017-12-23 DIAGNOSIS — I110 Hypertensive heart disease with heart failure: Secondary | ICD-10-CM | POA: Diagnosis not present

## 2017-12-23 DIAGNOSIS — I509 Heart failure, unspecified: Secondary | ICD-10-CM | POA: Diagnosis not present

## 2017-12-23 DIAGNOSIS — J479 Bronchiectasis, uncomplicated: Secondary | ICD-10-CM | POA: Diagnosis not present

## 2017-12-23 DIAGNOSIS — I482 Chronic atrial fibrillation: Secondary | ICD-10-CM | POA: Diagnosis not present

## 2017-12-23 DIAGNOSIS — F0390 Unspecified dementia without behavioral disturbance: Secondary | ICD-10-CM | POA: Diagnosis not present

## 2017-12-23 DIAGNOSIS — R5381 Other malaise: Secondary | ICD-10-CM | POA: Diagnosis not present

## 2017-12-23 DIAGNOSIS — I4891 Unspecified atrial fibrillation: Secondary | ICD-10-CM | POA: Diagnosis not present

## 2017-12-23 DIAGNOSIS — I119 Hypertensive heart disease without heart failure: Secondary | ICD-10-CM | POA: Diagnosis not present

## 2017-12-26 DIAGNOSIS — L97829 Non-pressure chronic ulcer of other part of left lower leg with unspecified severity: Secondary | ICD-10-CM | POA: Diagnosis not present

## 2017-12-26 DIAGNOSIS — I4891 Unspecified atrial fibrillation: Secondary | ICD-10-CM | POA: Diagnosis not present

## 2017-12-26 DIAGNOSIS — J479 Bronchiectasis, uncomplicated: Secondary | ICD-10-CM | POA: Diagnosis not present

## 2017-12-26 DIAGNOSIS — I110 Hypertensive heart disease with heart failure: Secondary | ICD-10-CM | POA: Diagnosis not present

## 2017-12-26 DIAGNOSIS — F0390 Unspecified dementia without behavioral disturbance: Secondary | ICD-10-CM | POA: Diagnosis not present

## 2017-12-26 DIAGNOSIS — I509 Heart failure, unspecified: Secondary | ICD-10-CM | POA: Diagnosis not present

## 2017-12-27 ENCOUNTER — Encounter: Payer: Self-pay | Admitting: Rheumatology

## 2017-12-27 ENCOUNTER — Ambulatory Visit (INDEPENDENT_AMBULATORY_CARE_PROVIDER_SITE_OTHER): Payer: Medicare Other | Admitting: Rheumatology

## 2017-12-27 VITALS — BP 126/70 | HR 67 | Resp 14

## 2017-12-27 DIAGNOSIS — M5136 Other intervertebral disc degeneration, lumbar region: Secondary | ICD-10-CM

## 2017-12-27 DIAGNOSIS — M47816 Spondylosis without myelopathy or radiculopathy, lumbar region: Secondary | ICD-10-CM | POA: Diagnosis not present

## 2017-12-27 DIAGNOSIS — M170 Bilateral primary osteoarthritis of knee: Secondary | ICD-10-CM

## 2017-12-27 DIAGNOSIS — M7061 Trochanteric bursitis, right hip: Secondary | ICD-10-CM

## 2017-12-27 DIAGNOSIS — M4005 Postural kyphosis, thoracolumbar region: Secondary | ICD-10-CM

## 2017-12-27 DIAGNOSIS — M19041 Primary osteoarthritis, right hand: Secondary | ICD-10-CM | POA: Diagnosis not present

## 2017-12-27 DIAGNOSIS — I25700 Atherosclerosis of coronary artery bypass graft(s), unspecified, with unstable angina pectoris: Secondary | ICD-10-CM

## 2017-12-27 DIAGNOSIS — Z8781 Personal history of (healed) traumatic fracture: Secondary | ICD-10-CM

## 2017-12-27 DIAGNOSIS — I200 Unstable angina: Secondary | ICD-10-CM

## 2017-12-27 DIAGNOSIS — F0151 Vascular dementia with behavioral disturbance: Secondary | ICD-10-CM | POA: Diagnosis not present

## 2017-12-27 DIAGNOSIS — Z9229 Personal history of other drug therapy: Secondary | ICD-10-CM

## 2017-12-27 DIAGNOSIS — I482 Chronic atrial fibrillation: Secondary | ICD-10-CM | POA: Diagnosis not present

## 2017-12-27 DIAGNOSIS — M810 Age-related osteoporosis without current pathological fracture: Secondary | ICD-10-CM

## 2017-12-27 DIAGNOSIS — Z8639 Personal history of other endocrine, nutritional and metabolic disease: Secondary | ICD-10-CM | POA: Diagnosis not present

## 2017-12-27 DIAGNOSIS — F5101 Primary insomnia: Secondary | ICD-10-CM

## 2017-12-27 DIAGNOSIS — Z8679 Personal history of other diseases of the circulatory system: Secondary | ICD-10-CM

## 2017-12-27 DIAGNOSIS — I119 Hypertensive heart disease without heart failure: Secondary | ICD-10-CM | POA: Diagnosis not present

## 2017-12-27 DIAGNOSIS — N400 Enlarged prostate without lower urinary tract symptoms: Secondary | ICD-10-CM | POA: Diagnosis not present

## 2017-12-27 DIAGNOSIS — M19042 Primary osteoarthritis, left hand: Secondary | ICD-10-CM

## 2017-12-28 DIAGNOSIS — F0390 Unspecified dementia without behavioral disturbance: Secondary | ICD-10-CM | POA: Diagnosis not present

## 2017-12-28 DIAGNOSIS — I4891 Unspecified atrial fibrillation: Secondary | ICD-10-CM | POA: Diagnosis not present

## 2017-12-28 DIAGNOSIS — I509 Heart failure, unspecified: Secondary | ICD-10-CM | POA: Diagnosis not present

## 2017-12-28 DIAGNOSIS — I110 Hypertensive heart disease with heart failure: Secondary | ICD-10-CM | POA: Diagnosis not present

## 2017-12-28 DIAGNOSIS — J479 Bronchiectasis, uncomplicated: Secondary | ICD-10-CM | POA: Diagnosis not present

## 2017-12-28 DIAGNOSIS — L97829 Non-pressure chronic ulcer of other part of left lower leg with unspecified severity: Secondary | ICD-10-CM | POA: Diagnosis not present

## 2017-12-29 DIAGNOSIS — F0390 Unspecified dementia without behavioral disturbance: Secondary | ICD-10-CM | POA: Diagnosis not present

## 2017-12-29 DIAGNOSIS — I110 Hypertensive heart disease with heart failure: Secondary | ICD-10-CM | POA: Diagnosis not present

## 2017-12-29 DIAGNOSIS — L97829 Non-pressure chronic ulcer of other part of left lower leg with unspecified severity: Secondary | ICD-10-CM | POA: Diagnosis not present

## 2017-12-29 DIAGNOSIS — J479 Bronchiectasis, uncomplicated: Secondary | ICD-10-CM | POA: Diagnosis not present

## 2017-12-29 DIAGNOSIS — I509 Heart failure, unspecified: Secondary | ICD-10-CM | POA: Diagnosis not present

## 2017-12-29 DIAGNOSIS — I4891 Unspecified atrial fibrillation: Secondary | ICD-10-CM | POA: Diagnosis not present

## 2018-01-02 DIAGNOSIS — L97829 Non-pressure chronic ulcer of other part of left lower leg with unspecified severity: Secondary | ICD-10-CM | POA: Diagnosis not present

## 2018-01-02 DIAGNOSIS — F0390 Unspecified dementia without behavioral disturbance: Secondary | ICD-10-CM | POA: Diagnosis not present

## 2018-01-02 DIAGNOSIS — I4891 Unspecified atrial fibrillation: Secondary | ICD-10-CM | POA: Diagnosis not present

## 2018-01-02 DIAGNOSIS — J479 Bronchiectasis, uncomplicated: Secondary | ICD-10-CM | POA: Diagnosis not present

## 2018-01-02 DIAGNOSIS — I110 Hypertensive heart disease with heart failure: Secondary | ICD-10-CM | POA: Diagnosis not present

## 2018-01-02 DIAGNOSIS — I509 Heart failure, unspecified: Secondary | ICD-10-CM | POA: Diagnosis not present

## 2018-01-03 DIAGNOSIS — R109 Unspecified abdominal pain: Secondary | ICD-10-CM | POA: Diagnosis not present

## 2018-01-03 DIAGNOSIS — F0151 Vascular dementia with behavioral disturbance: Secondary | ICD-10-CM | POA: Diagnosis not present

## 2018-01-03 DIAGNOSIS — K560 Paralytic ileus: Secondary | ICD-10-CM | POA: Diagnosis not present

## 2018-01-03 DIAGNOSIS — R079 Chest pain, unspecified: Secondary | ICD-10-CM | POA: Diagnosis not present

## 2018-01-04 DIAGNOSIS — I509 Heart failure, unspecified: Secondary | ICD-10-CM | POA: Diagnosis not present

## 2018-01-04 DIAGNOSIS — J479 Bronchiectasis, uncomplicated: Secondary | ICD-10-CM | POA: Diagnosis not present

## 2018-01-04 DIAGNOSIS — I4891 Unspecified atrial fibrillation: Secondary | ICD-10-CM | POA: Diagnosis not present

## 2018-01-04 DIAGNOSIS — I110 Hypertensive heart disease with heart failure: Secondary | ICD-10-CM | POA: Diagnosis not present

## 2018-01-04 DIAGNOSIS — L97829 Non-pressure chronic ulcer of other part of left lower leg with unspecified severity: Secondary | ICD-10-CM | POA: Diagnosis not present

## 2018-01-04 DIAGNOSIS — F0390 Unspecified dementia without behavioral disturbance: Secondary | ICD-10-CM | POA: Diagnosis not present

## 2018-01-05 DIAGNOSIS — F0630 Mood disorder due to known physiological condition, unspecified: Secondary | ICD-10-CM | POA: Diagnosis not present

## 2018-01-05 DIAGNOSIS — R451 Restlessness and agitation: Secondary | ICD-10-CM | POA: Diagnosis not present

## 2018-01-05 DIAGNOSIS — F0391 Unspecified dementia with behavioral disturbance: Secondary | ICD-10-CM | POA: Diagnosis not present

## 2018-01-05 DIAGNOSIS — F5102 Adjustment insomnia: Secondary | ICD-10-CM | POA: Diagnosis not present

## 2018-01-06 DIAGNOSIS — I110 Hypertensive heart disease with heart failure: Secondary | ICD-10-CM | POA: Diagnosis not present

## 2018-01-06 DIAGNOSIS — R634 Abnormal weight loss: Secondary | ICD-10-CM | POA: Diagnosis not present

## 2018-01-06 DIAGNOSIS — J479 Bronchiectasis, uncomplicated: Secondary | ICD-10-CM | POA: Diagnosis not present

## 2018-01-06 DIAGNOSIS — I4891 Unspecified atrial fibrillation: Secondary | ICD-10-CM | POA: Diagnosis not present

## 2018-01-06 DIAGNOSIS — F0390 Unspecified dementia without behavioral disturbance: Secondary | ICD-10-CM | POA: Diagnosis not present

## 2018-01-06 DIAGNOSIS — F0151 Vascular dementia with behavioral disturbance: Secondary | ICD-10-CM | POA: Diagnosis not present

## 2018-01-06 DIAGNOSIS — L97829 Non-pressure chronic ulcer of other part of left lower leg with unspecified severity: Secondary | ICD-10-CM | POA: Diagnosis not present

## 2018-01-06 DIAGNOSIS — R64 Cachexia: Secondary | ICD-10-CM | POA: Diagnosis not present

## 2018-01-06 DIAGNOSIS — R431 Parosmia: Secondary | ICD-10-CM | POA: Diagnosis not present

## 2018-01-06 DIAGNOSIS — I509 Heart failure, unspecified: Secondary | ICD-10-CM | POA: Diagnosis not present

## 2018-01-08 DIAGNOSIS — F0390 Unspecified dementia without behavioral disturbance: Secondary | ICD-10-CM | POA: Diagnosis not present

## 2018-01-08 DIAGNOSIS — I110 Hypertensive heart disease with heart failure: Secondary | ICD-10-CM | POA: Diagnosis not present

## 2018-01-08 DIAGNOSIS — I4891 Unspecified atrial fibrillation: Secondary | ICD-10-CM | POA: Diagnosis not present

## 2018-01-08 DIAGNOSIS — J479 Bronchiectasis, uncomplicated: Secondary | ICD-10-CM | POA: Diagnosis not present

## 2018-01-08 DIAGNOSIS — I509 Heart failure, unspecified: Secondary | ICD-10-CM | POA: Diagnosis not present

## 2018-01-08 DIAGNOSIS — L97829 Non-pressure chronic ulcer of other part of left lower leg with unspecified severity: Secondary | ICD-10-CM | POA: Diagnosis not present

## 2018-01-10 DIAGNOSIS — R6889 Other general symptoms and signs: Secondary | ICD-10-CM | POA: Diagnosis not present

## 2018-01-10 DIAGNOSIS — E785 Hyperlipidemia, unspecified: Secondary | ICD-10-CM | POA: Diagnosis not present

## 2018-01-10 DIAGNOSIS — R600 Localized edema: Secondary | ICD-10-CM | POA: Diagnosis not present

## 2018-01-10 DIAGNOSIS — Z951 Presence of aortocoronary bypass graft: Secondary | ICD-10-CM | POA: Diagnosis not present

## 2018-01-10 DIAGNOSIS — K219 Gastro-esophageal reflux disease without esophagitis: Secondary | ICD-10-CM | POA: Diagnosis not present

## 2018-01-10 DIAGNOSIS — I509 Heart failure, unspecified: Secondary | ICD-10-CM | POA: Diagnosis not present

## 2018-01-10 DIAGNOSIS — R64 Cachexia: Secondary | ICD-10-CM | POA: Diagnosis not present

## 2018-01-10 DIAGNOSIS — R634 Abnormal weight loss: Secondary | ICD-10-CM | POA: Diagnosis not present

## 2018-01-10 DIAGNOSIS — Z915 Personal history of self-harm: Secondary | ICD-10-CM | POA: Diagnosis not present

## 2018-01-10 DIAGNOSIS — S99921A Unspecified injury of right foot, initial encounter: Secondary | ICD-10-CM | POA: Diagnosis not present

## 2018-01-10 DIAGNOSIS — I110 Hypertensive heart disease with heart failure: Secondary | ICD-10-CM | POA: Diagnosis not present

## 2018-01-10 DIAGNOSIS — M549 Dorsalgia, unspecified: Secondary | ICD-10-CM | POA: Diagnosis not present

## 2018-01-10 DIAGNOSIS — G4700 Insomnia, unspecified: Secondary | ICD-10-CM | POA: Diagnosis not present

## 2018-01-10 DIAGNOSIS — J479 Bronchiectasis, uncomplicated: Secondary | ICD-10-CM | POA: Diagnosis not present

## 2018-01-10 DIAGNOSIS — Z950 Presence of cardiac pacemaker: Secondary | ICD-10-CM | POA: Diagnosis not present

## 2018-01-10 DIAGNOSIS — F0151 Vascular dementia with behavioral disturbance: Secondary | ICD-10-CM | POA: Diagnosis not present

## 2018-01-10 DIAGNOSIS — F419 Anxiety disorder, unspecified: Secondary | ICD-10-CM | POA: Diagnosis not present

## 2018-01-10 DIAGNOSIS — Z87891 Personal history of nicotine dependence: Secondary | ICD-10-CM | POA: Diagnosis not present

## 2018-01-10 DIAGNOSIS — I4891 Unspecified atrial fibrillation: Secondary | ICD-10-CM | POA: Diagnosis not present

## 2018-01-10 DIAGNOSIS — I2510 Atherosclerotic heart disease of native coronary artery without angina pectoris: Secondary | ICD-10-CM | POA: Diagnosis not present

## 2018-01-10 DIAGNOSIS — M810 Age-related osteoporosis without current pathological fracture: Secondary | ICD-10-CM | POA: Diagnosis not present

## 2018-01-10 DIAGNOSIS — R531 Weakness: Secondary | ICD-10-CM | POA: Diagnosis not present

## 2018-01-10 DIAGNOSIS — I679 Cerebrovascular disease, unspecified: Secondary | ICD-10-CM | POA: Diagnosis not present

## 2018-01-11 DIAGNOSIS — I679 Cerebrovascular disease, unspecified: Secondary | ICD-10-CM | POA: Diagnosis not present

## 2018-01-11 DIAGNOSIS — R64 Cachexia: Secondary | ICD-10-CM | POA: Diagnosis not present

## 2018-01-11 DIAGNOSIS — R634 Abnormal weight loss: Secondary | ICD-10-CM | POA: Diagnosis not present

## 2018-01-11 DIAGNOSIS — M549 Dorsalgia, unspecified: Secondary | ICD-10-CM | POA: Diagnosis not present

## 2018-01-11 DIAGNOSIS — R531 Weakness: Secondary | ICD-10-CM | POA: Diagnosis not present

## 2018-01-11 DIAGNOSIS — F0151 Vascular dementia with behavioral disturbance: Secondary | ICD-10-CM | POA: Diagnosis not present

## 2018-01-12 DIAGNOSIS — I679 Cerebrovascular disease, unspecified: Secondary | ICD-10-CM | POA: Diagnosis not present

## 2018-01-12 DIAGNOSIS — R634 Abnormal weight loss: Secondary | ICD-10-CM | POA: Diagnosis not present

## 2018-01-12 DIAGNOSIS — R531 Weakness: Secondary | ICD-10-CM | POA: Diagnosis not present

## 2018-01-12 DIAGNOSIS — F0151 Vascular dementia with behavioral disturbance: Secondary | ICD-10-CM | POA: Diagnosis not present

## 2018-01-12 DIAGNOSIS — R64 Cachexia: Secondary | ICD-10-CM | POA: Diagnosis not present

## 2018-01-12 DIAGNOSIS — M549 Dorsalgia, unspecified: Secondary | ICD-10-CM | POA: Diagnosis not present

## 2018-01-14 DIAGNOSIS — R531 Weakness: Secondary | ICD-10-CM | POA: Diagnosis not present

## 2018-01-14 DIAGNOSIS — M549 Dorsalgia, unspecified: Secondary | ICD-10-CM | POA: Diagnosis not present

## 2018-01-14 DIAGNOSIS — R64 Cachexia: Secondary | ICD-10-CM | POA: Diagnosis not present

## 2018-01-14 DIAGNOSIS — R634 Abnormal weight loss: Secondary | ICD-10-CM | POA: Diagnosis not present

## 2018-01-14 DIAGNOSIS — I679 Cerebrovascular disease, unspecified: Secondary | ICD-10-CM | POA: Diagnosis not present

## 2018-01-14 DIAGNOSIS — F0151 Vascular dementia with behavioral disturbance: Secondary | ICD-10-CM | POA: Diagnosis not present

## 2018-01-15 DIAGNOSIS — R634 Abnormal weight loss: Secondary | ICD-10-CM | POA: Diagnosis not present

## 2018-01-15 DIAGNOSIS — M549 Dorsalgia, unspecified: Secondary | ICD-10-CM | POA: Diagnosis not present

## 2018-01-15 DIAGNOSIS — R531 Weakness: Secondary | ICD-10-CM | POA: Diagnosis not present

## 2018-01-15 DIAGNOSIS — R64 Cachexia: Secondary | ICD-10-CM | POA: Diagnosis not present

## 2018-01-15 DIAGNOSIS — F0151 Vascular dementia with behavioral disturbance: Secondary | ICD-10-CM | POA: Diagnosis not present

## 2018-01-15 DIAGNOSIS — I679 Cerebrovascular disease, unspecified: Secondary | ICD-10-CM | POA: Diagnosis not present

## 2018-01-16 DIAGNOSIS — R64 Cachexia: Secondary | ICD-10-CM | POA: Diagnosis not present

## 2018-01-16 DIAGNOSIS — F0151 Vascular dementia with behavioral disturbance: Secondary | ICD-10-CM | POA: Diagnosis not present

## 2018-01-16 DIAGNOSIS — R531 Weakness: Secondary | ICD-10-CM | POA: Diagnosis not present

## 2018-01-16 DIAGNOSIS — R634 Abnormal weight loss: Secondary | ICD-10-CM | POA: Diagnosis not present

## 2018-01-16 DIAGNOSIS — M549 Dorsalgia, unspecified: Secondary | ICD-10-CM | POA: Diagnosis not present

## 2018-01-16 DIAGNOSIS — I679 Cerebrovascular disease, unspecified: Secondary | ICD-10-CM | POA: Diagnosis not present

## 2018-01-17 DIAGNOSIS — R531 Weakness: Secondary | ICD-10-CM | POA: Diagnosis not present

## 2018-01-17 DIAGNOSIS — F0151 Vascular dementia with behavioral disturbance: Secondary | ICD-10-CM | POA: Diagnosis not present

## 2018-01-17 DIAGNOSIS — R64 Cachexia: Secondary | ICD-10-CM | POA: Diagnosis not present

## 2018-01-17 DIAGNOSIS — R634 Abnormal weight loss: Secondary | ICD-10-CM | POA: Diagnosis not present

## 2018-01-17 DIAGNOSIS — M549 Dorsalgia, unspecified: Secondary | ICD-10-CM | POA: Diagnosis not present

## 2018-01-17 DIAGNOSIS — I679 Cerebrovascular disease, unspecified: Secondary | ICD-10-CM | POA: Diagnosis not present

## 2018-01-18 DIAGNOSIS — F0151 Vascular dementia with behavioral disturbance: Secondary | ICD-10-CM | POA: Diagnosis not present

## 2018-01-18 DIAGNOSIS — R634 Abnormal weight loss: Secondary | ICD-10-CM | POA: Diagnosis not present

## 2018-01-18 DIAGNOSIS — R531 Weakness: Secondary | ICD-10-CM | POA: Diagnosis not present

## 2018-01-18 DIAGNOSIS — I679 Cerebrovascular disease, unspecified: Secondary | ICD-10-CM | POA: Diagnosis not present

## 2018-01-18 DIAGNOSIS — R64 Cachexia: Secondary | ICD-10-CM | POA: Diagnosis not present

## 2018-01-18 DIAGNOSIS — M549 Dorsalgia, unspecified: Secondary | ICD-10-CM | POA: Diagnosis not present

## 2018-01-19 DIAGNOSIS — R64 Cachexia: Secondary | ICD-10-CM | POA: Diagnosis not present

## 2018-01-19 DIAGNOSIS — R531 Weakness: Secondary | ICD-10-CM | POA: Diagnosis not present

## 2018-01-19 DIAGNOSIS — F0151 Vascular dementia with behavioral disturbance: Secondary | ICD-10-CM | POA: Diagnosis not present

## 2018-01-19 DIAGNOSIS — I679 Cerebrovascular disease, unspecified: Secondary | ICD-10-CM | POA: Diagnosis not present

## 2018-01-19 DIAGNOSIS — R634 Abnormal weight loss: Secondary | ICD-10-CM | POA: Diagnosis not present

## 2018-01-19 DIAGNOSIS — M549 Dorsalgia, unspecified: Secondary | ICD-10-CM | POA: Diagnosis not present

## 2018-01-20 DIAGNOSIS — M549 Dorsalgia, unspecified: Secondary | ICD-10-CM | POA: Diagnosis not present

## 2018-01-20 DIAGNOSIS — R634 Abnormal weight loss: Secondary | ICD-10-CM | POA: Diagnosis not present

## 2018-01-20 DIAGNOSIS — I2510 Atherosclerotic heart disease of native coronary artery without angina pectoris: Secondary | ICD-10-CM | POA: Diagnosis not present

## 2018-01-20 DIAGNOSIS — I4891 Unspecified atrial fibrillation: Secondary | ICD-10-CM | POA: Diagnosis not present

## 2018-01-20 DIAGNOSIS — K219 Gastro-esophageal reflux disease without esophagitis: Secondary | ICD-10-CM | POA: Diagnosis not present

## 2018-01-20 DIAGNOSIS — R64 Cachexia: Secondary | ICD-10-CM | POA: Diagnosis not present

## 2018-01-20 DIAGNOSIS — E785 Hyperlipidemia, unspecified: Secondary | ICD-10-CM | POA: Diagnosis not present

## 2018-01-20 DIAGNOSIS — I110 Hypertensive heart disease with heart failure: Secondary | ICD-10-CM | POA: Diagnosis not present

## 2018-01-20 DIAGNOSIS — G4700 Insomnia, unspecified: Secondary | ICD-10-CM | POA: Diagnosis not present

## 2018-01-20 DIAGNOSIS — Z87891 Personal history of nicotine dependence: Secondary | ICD-10-CM | POA: Diagnosis not present

## 2018-01-20 DIAGNOSIS — Z950 Presence of cardiac pacemaker: Secondary | ICD-10-CM | POA: Diagnosis not present

## 2018-01-20 DIAGNOSIS — F419 Anxiety disorder, unspecified: Secondary | ICD-10-CM | POA: Diagnosis not present

## 2018-01-20 DIAGNOSIS — Z915 Personal history of self-harm: Secondary | ICD-10-CM | POA: Diagnosis not present

## 2018-01-20 DIAGNOSIS — M810 Age-related osteoporosis without current pathological fracture: Secondary | ICD-10-CM | POA: Diagnosis not present

## 2018-01-20 DIAGNOSIS — I679 Cerebrovascular disease, unspecified: Secondary | ICD-10-CM | POA: Diagnosis not present

## 2018-01-20 DIAGNOSIS — Z951 Presence of aortocoronary bypass graft: Secondary | ICD-10-CM | POA: Diagnosis not present

## 2018-01-20 DIAGNOSIS — I509 Heart failure, unspecified: Secondary | ICD-10-CM | POA: Diagnosis not present

## 2018-01-20 DIAGNOSIS — J479 Bronchiectasis, uncomplicated: Secondary | ICD-10-CM | POA: Diagnosis not present

## 2018-01-20 DIAGNOSIS — F0151 Vascular dementia with behavioral disturbance: Secondary | ICD-10-CM | POA: Diagnosis not present

## 2018-01-20 DIAGNOSIS — R531 Weakness: Secondary | ICD-10-CM | POA: Diagnosis not present

## 2018-02-17 DIAGNOSIS — 419620001 Death: Secondary | SNOMED CT | POA: Diagnosis not present

## 2018-02-17 DEATH — deceased

## 2018-09-11 ENCOUNTER — Telehealth: Payer: Self-pay | Admitting: Cardiology

## 2018-09-11 NOTE — Telephone Encounter (Signed)
New Message:    Son wanted you to know that pt passed away.

## 2018-09-11 NOTE — Telephone Encounter (Signed)
Spoke with pt son, condolences given.
